# Patient Record
Sex: Female | Born: 1935
Health system: Southern US, Community
[De-identification: ages and names within clinical notes are randomized; demographics above are authoritative.]

## PROBLEM LIST (undated history)

## (undated) DIAGNOSIS — F0280 Dementia in other diseases classified elsewhere without behavioral disturbance: Secondary | ICD-10-CM

## (undated) DIAGNOSIS — I10 Essential (primary) hypertension: Secondary | ICD-10-CM

## (undated) DIAGNOSIS — E7800 Pure hypercholesterolemia, unspecified: Secondary | ICD-10-CM

## (undated) DIAGNOSIS — F410 Panic disorder [episodic paroxysmal anxiety] without agoraphobia: Secondary | ICD-10-CM

## (undated) DIAGNOSIS — I480 Paroxysmal atrial fibrillation: Secondary | ICD-10-CM

## (undated) DIAGNOSIS — H353210 Exudative age-related macular degeneration, right eye, stage unspecified: Secondary | ICD-10-CM

## (undated) DIAGNOSIS — N390 Urinary tract infection, site not specified: Secondary | ICD-10-CM

## (undated) DIAGNOSIS — G3183 Dementia with Lewy bodies: Secondary | ICD-10-CM

## (undated) DIAGNOSIS — F32A Depression, unspecified: Secondary | ICD-10-CM

## (undated) DIAGNOSIS — I459 Conduction disorder, unspecified: Secondary | ICD-10-CM

## (undated) DIAGNOSIS — R55 Syncope and collapse: Secondary | ICD-10-CM

## (undated) DIAGNOSIS — Z952 Presence of prosthetic heart valve: Secondary | ICD-10-CM

## (undated) DIAGNOSIS — R011 Cardiac murmur, unspecified: Secondary | ICD-10-CM

## (undated) DIAGNOSIS — F329 Major depressive disorder, single episode, unspecified: Secondary | ICD-10-CM

## (undated) HISTORY — PX: TONSILLECTOMY: SUR1361

## (undated) HISTORY — DX: Syncope and collapse: R55

## (undated) HISTORY — DX: Paroxysmal atrial fibrillation: I48.0

## (undated) HISTORY — PX: EYE SURGERY: SHX253

## (undated) HISTORY — DX: Urinary tract infection, site not specified: N39.0

---

## 1999-06-30 ENCOUNTER — Emergency Department (HOSPITAL_COMMUNITY): Admission: EM | Admit: 1999-06-30 | Discharge: 1999-06-30 | Payer: Self-pay

## 2004-03-08 ENCOUNTER — Ambulatory Visit: Payer: Self-pay | Admitting: Gastroenterology

## 2004-03-30 ENCOUNTER — Ambulatory Visit: Payer: Self-pay | Admitting: Gastroenterology

## 2006-04-17 ENCOUNTER — Emergency Department (HOSPITAL_COMMUNITY): Admission: EM | Admit: 2006-04-17 | Discharge: 2006-04-17 | Payer: Self-pay | Admitting: Emergency Medicine

## 2007-01-14 ENCOUNTER — Inpatient Hospital Stay (HOSPITAL_COMMUNITY): Admission: EM | Admit: 2007-01-14 | Discharge: 2007-01-15 | Payer: Self-pay | Admitting: Emergency Medicine

## 2007-01-29 ENCOUNTER — Ambulatory Visit: Payer: Self-pay | Admitting: Internal Medicine

## 2007-03-14 ENCOUNTER — Emergency Department (HOSPITAL_COMMUNITY): Admission: EM | Admit: 2007-03-14 | Discharge: 2007-03-14 | Payer: Self-pay | Admitting: Emergency Medicine

## 2007-06-03 ENCOUNTER — Telehealth: Payer: Self-pay | Admitting: Internal Medicine

## 2007-06-09 DIAGNOSIS — I10 Essential (primary) hypertension: Secondary | ICD-10-CM

## 2007-06-09 DIAGNOSIS — M810 Age-related osteoporosis without current pathological fracture: Secondary | ICD-10-CM | POA: Insufficient documentation

## 2007-06-09 DIAGNOSIS — E785 Hyperlipidemia, unspecified: Secondary | ICD-10-CM

## 2007-06-09 DIAGNOSIS — J309 Allergic rhinitis, unspecified: Secondary | ICD-10-CM | POA: Insufficient documentation

## 2007-07-07 ENCOUNTER — Ambulatory Visit: Payer: Self-pay | Admitting: Internal Medicine

## 2007-07-07 DIAGNOSIS — J42 Unspecified chronic bronchitis: Secondary | ICD-10-CM | POA: Insufficient documentation

## 2007-07-07 DIAGNOSIS — J329 Chronic sinusitis, unspecified: Secondary | ICD-10-CM | POA: Insufficient documentation

## 2007-07-09 ENCOUNTER — Ambulatory Visit: Payer: Self-pay | Admitting: Internal Medicine

## 2007-07-11 ENCOUNTER — Ambulatory Visit: Payer: Self-pay | Admitting: Internal Medicine

## 2007-07-16 ENCOUNTER — Telehealth: Payer: Self-pay | Admitting: Internal Medicine

## 2007-07-28 ENCOUNTER — Ambulatory Visit: Payer: Self-pay | Admitting: Internal Medicine

## 2007-08-31 ENCOUNTER — Inpatient Hospital Stay (HOSPITAL_COMMUNITY): Admission: EM | Admit: 2007-08-31 | Discharge: 2007-09-02 | Payer: Self-pay | Admitting: Emergency Medicine

## 2007-09-02 ENCOUNTER — Encounter: Payer: Self-pay | Admitting: Internal Medicine

## 2007-09-05 ENCOUNTER — Ambulatory Visit: Payer: Self-pay | Admitting: Internal Medicine

## 2007-12-09 ENCOUNTER — Ambulatory Visit: Payer: Self-pay | Admitting: Internal Medicine

## 2007-12-09 DIAGNOSIS — J479 Bronchiectasis, uncomplicated: Secondary | ICD-10-CM

## 2007-12-29 ENCOUNTER — Ambulatory Visit: Payer: Self-pay | Admitting: Internal Medicine

## 2007-12-31 ENCOUNTER — Encounter: Payer: Self-pay | Admitting: Internal Medicine

## 2008-07-26 ENCOUNTER — Encounter: Admission: RE | Admit: 2008-07-26 | Discharge: 2008-07-26 | Payer: Self-pay | Admitting: Orthopedic Surgery

## 2008-08-02 ENCOUNTER — Encounter: Admission: RE | Admit: 2008-08-02 | Discharge: 2008-08-02 | Payer: Self-pay | Admitting: Orthopedic Surgery

## 2010-05-21 ENCOUNTER — Encounter: Payer: Self-pay | Admitting: Orthopedic Surgery

## 2010-09-12 NOTE — Discharge Summary (Signed)
NAMECHARON, SMEDBERG NO.:  [id number]   MEDICAL RECORD NO.:  [id number]          PATIENT TYPE:  INP   LOCATION:  1536                         FACILITY:  [facility]   PHYSICIAN:  Ramiro Harvest, MD    DATE OF BIRTH:  [birth year]   DATE OF ADMISSION:  08/31/2007  DATE OF DISCHARGE:  09/02/2007                               DISCHARGE SUMMARY   PRIMARY CARE PHYSICIAN:  C. [name], M.D., [facility].   DISCHARGE DIAGNOSES:  1. Ischemic colitis.  2. Hypokalemia.  3. Hypertension.  4. Hyperlipidemia.  5. Diverticulosis.  6. Sinusitis/rhinitis.  7. Osteoporosis.  8. Hemorrhoids.  9. Status post right cataract surgery Aug 29, 2007.  10.Right adnexal mass compatible with dermoid.   DISCHARGE MEDICATIONS:  1. Lipitor 10 mg p.o. daily.  2. Fosamax 70 mg p.o. weekly.  3. Fluticasone nasal spray in each nostril daily.  4. Cataract eye drops as previously taken.   DISPOSITION AND FOLLOWUP:  The patient will be discharged home with a  low residue diet.  The patient is to schedule a followup appointment  with PCP in the next 1-2 weeks, and on followup, a basic metabolic  profile needs to be checked to follow up on the patient's potassium.  CBC will also need to be checked to follow up on the patient's  hemoglobin.  The patient's blood pressure medications have been  discontinued secondary to ischemic colitis, and the patient's blood  pressure will need to be reassessed per PCP.  Will hold off on blood  pressure medications for the next 4 weeks secondary to ischemic colitis  and will defer to PCP.  The PCP will need to follow up with dermoid  right adnexal mass per transvaginal ultrasound obtained in the hospital.  The patient may need to follow up with her OB/GYN.  Biopsies will need  to be followed up from colonoscopy done on Sep 02, 2007.   CONSULTATIONS:  A GI consult was sought.  The patient was seen in  consultation by [facility].  The patient was  seen by Dr.  Juanda Chance of [facility] on Sep 01, 2007.   PROCEDURES PERFORMED:  1. CT of the abdomen and pelvis was done on Aug 31, 2007 that showed      mucosal edema of the distal transverse colon, splenic flexure of      the colon, descending colon, consistent with colitis.  Multiple      hepatic cysts, small nodular right lung base above the      hemidiaphragm.  Recommend followup CT in 4-6 months to assess      stability.  Colitis involving the descending colon and proximal      sigmoid colon, fatty rounded mass within the right posterior pelvis      with dense calcification peripherally, most  consistent with a      dermoid cyst of 76 x 83 x 81 mm.  2. A transvaginal ultrasound was done on Sep 01, 2007 that showed a      right adnexal mass with sonographic features compatible with  dermoid.  3. A colonoscopy was done on Sep 02, 2007 which showed diverticulosis      and ischemic colitis, acute colitis left and distal transverse      colon consistent with ischemic colitis status post biopsies.   BRIEF ADMISSION HISTORY AND PHYSICAL:  Ms. Rebecca [name] is a 77 year old  white female with history of diverticulosis, internal hemorrhoids,  hypertension, and hyperlipidemia who presented to the ED with a several-  hour history of right lower quadrant intermittent abdominal cramping,  nausea with emesis x1, and diarrhea with bright red blood per rectum x2  episodes.  The patient stated she awoke the night prior to admission  while sleeping with severe cramping, nonradiating in nature, in the  right lower quadrant, intermittent in nature with some loose watery  stools.  The patient stated a few hours later she also developed bright  red blood per rectum and went to the ED.  The patient denied any fevers,  no chills, no hematemesis, no melena, no chest pain, no shortness of  breath, no cough, no headache, no visual changes, no recent trauma, no  recent use of antibiotics, no exotic foods, no  dizziness, no syncope, no  other associated symptoms.  In the ED, the patient had another episode  of bright red blood per rectum.  The patient was found to be hypokalemic  and to have a slightly elevated white count.  The patient was given a  500 mL bolus of normal saline in the ED and also given a dose of Cipro  and Flagyl.  CT of the abdomen and pelvis were ordered and were pending  at time of admission.   ADMISSION PHYSICAL EXAMINATION:  VITAL SIGNS:  Temperature 97.2, blood  pressure 132/64, pulse of 104 down to 85, respiratory rate 18, sating  98% on room air.  GENERAL:  The patient is a pleasant elderly female in no apparent  distress.  HEENT:  Normocephalic, atraumatic.  Pupils equal, round and reactive to  light.  Extraocular movements intact.  Oropharynx is clear, moist, no  lesions, no exudates.  NECK:  Supple.  No lymphadenopathy.  RESPIRATORY:  Lungs clear to auscultation bilaterally.  No wheezes, no  rhonchi, no crackles.  CARDIOVASCULAR:  Regular rate and rhythm.  No murmurs, rubs or gallops.  ABDOMEN:  Soft, nontender, nondistended, positive bowel sounds.  No  rebound, no guarding.  EXTREMITIES:  No clubbing, cyanosis or edema.  NEUROLOGIC:  The patient alert and oriented x3.  Cranial nerves II-XII  grossly intact.  No focal deficits.   LABORATORY DATA:  On admission, CBC with white count of 11.1, hemoglobin  14.1, platelets 229, hematocrit 42.1, ANC of 7.7.  CMET:  Sodium 138,  potassium 2.9, chloride 97, bicarb 30, BUN 16, creatinine 1.12, glucose  126.  Calcium 10, bilirubin 0.9, albumin 4.0, alkaline phosphatase 103,  AST 23, ALT 17, protein 7.3.  Lipase 20.  UA:  Yellow, clear, specific  gravity 1.0021, pH of 6, glucose negative, bilirubin negative, ketones  negative, blood moderate, protein negative, urobilinogen 0.2, nitrite  negative, leukocytes moderate, micro WBC 3-6, RBCs 0-2.   HOSPITAL COURSE:  Problem 1.  Ischemic of colitis.  The patient was   admitted with nausea, vomiting, abdominal pain, and bright red blood per  rectum.  The patient was initially sent to the step-down unit.  CBC was  obtained q.8h.  The patient was hydrated with IV fluids, given a liter  bolus of normal saline, and placed on 100  mL per hour.  The patient  remained stable.  The patient had one more episode of bright red blood  per rectum.  The patient was also initially placed on Cipro and Flagyl  for empiric coverage for possible infectious colitis.  Stool cultures  were obtained, and at the time of discharge, ova and parasites were  negative and stool cultures have been negative.  The patient was typed  and crossed for 2 units of packed red blood cells and placed on a  Protonix as well.  [facility] was consulted.  The patient  was seen in consultation by Dr. Juanda Chance of [facility] on Sep 01, 2007.  The patient's Protonix was discontinued.  The patient was prepped for  colonoscopy which was done on Sep 02, 2007, with colonoscopy results as  stated above.  Colonoscopy results were consistent with ischemic  colitis.  The patient's Cipro and Flagyl were discontinued.  The  patient's diet was advanced to a low residue diet.  The patient  tolerated diet well.  The patient was painfree by day of discharge.  The  patient remained in stable and improved condition.  By day of discharge,  the patient was in improved condition.  The patient's blood pressure  medications have been discontinued secondary to ischemic colitis and  will remain off her blood pressure medicines.  Will defer to primary  care physician for restart of those, but will probably not restart the  patient's blood pressure medicines for the next month or so.  Colonoscopy biopsies were obtained, and these will need to be followed  up per PCP.   Problem 2.  Hypokalemia.  The patient was found to be hypokalemic and it  was felt secondary to loose stools/diarrhea.  Magnesium level was   checked.  The patient's magnesium was 1.5.  The patient's magnesium was  repleted, and the patient's potassium was repleted.  On the morning of  Sep 02, 2007, the patient had a potassium level of 2.9.  The patient was  given 3 doses of K-Dur 40 mEq prior to discharge, and the patient's BMET  will need to be checked to follow up on the patient's potassium and  follow up with PCP.   Problem 3.  Hypertension.  The patient's blood pressure medications were  held secondary to her ischemic colitis.  The patient will not be  restarted on her blood pressure medications on discharge.  This will be  deferred to her PCP.   Problem 4.  Hyperlipidemia, stable.  Will continue the patient on her  home dose of Lipitor.   The rest of the patient's chronic medical issues were stable throughout  the hospitalization, and the patient will be discharged in stable and  improved condition.   DISCHARGE VITAL SIGNS:  VITAL SIGNS:  On day of discharge, temperature  97.1, pulse of 77, blood pressure 117/62, respiratory rate 18, sating  94% on room air.   DISCHARGE LAB DATA:  BMET on discharge:  Sodium 138, potassium 2.9,  chloride 107, bicarb 28, BUN 7, creatinine 0.75, glucose of 93, calcium  of 8.1, magnesium of 1.9.  CBC:  White count 9.0, hemoglobin 11.3,  hematocrit 32.8, and platelets was 175.  CA-125 was 11.9.  Stool was  negative for ova and parasites.   The patient will be discharged in stable and improved condition to  follow up with PCP, as stated above.   It was a pleasure taking care of Ms. Rebecca [name].  Ramiro Harvest, MD  Electronically Signed     DT/MEDQ  D:  09/02/2007  T:  09/02/2007  Job:  [id number]   cc:   C. [name], M.D.  Fax: [phone]   Hedwig Morton. Juanda Chance, MD  [address]  [place]  [place] [address]

## 2010-09-12 NOTE — Discharge Summary (Signed)
Rebecca [name], Rebecca [name]                ACCOUNT NO.:  [id number]   MEDICAL RECORD NO.:  [id number]          PATIENT TYPE:  INP   LOCATION:  1309                         FACILITY:  [facility]   PHYSICIAN:  Michelene Gardener, MD    DATE OF BIRTH:  1925-09-10   DATE OF ADMISSION:  01/14/2007  DATE OF DISCHARGE:                               DISCHARGE SUMMARY   PRIMARY CARE PHYSICIAN:  Erin E Black, DO   DISCHARGE DIAGNOSES:  1. Acute pneumonia.  2. Postnasal drip.  3. Anorexia.  4. Weight loss.  5. Hypokalemia.  6. Hypertension.  7. Osteoporosis.  8. Hyperlipidemia.  9. Gastroesophageal reflux disease.   DISCHARGE MEDICATIONS:  1. Adalat 90 mg p.o. daily.  2. Benicar 40/25 mg p.o. daily.  3. Fosamax 70 mg once weekly.  4. Lipitor 10 mg p.o. daily.   NEW MEDICATIONS:  1. Avelox 400 mg p.o. daily x7 days.  2. Tussionex 5 ml p.o. every 12 hrs as needed.  3. Albuterol 2 puffs every 4 hrs p.r.n.   CONSULTATIONS:  None.   PROCEDURE:  None.   FOLLOWUP:  Primary physician in one to two weeks.   RADIOLOGY STUDIES:  1. Chest x-ray on September 16th showed possible pneumonia involving      the lingula on the right upper lobe.  2. Repeat chest x-ray showed questionable pneumonia with small      bilateral effusions.   HOSPITAL COURSE:  This is a 77 year old Caucasian female who presented  to the hospital with chief complaint of cough.  Her x-rays showed  possible pneumonia in the lingula.  The patient had repeat x-ray done on  September 17th; it showed the same findings.  The patient remained  afebrile.  White count remained normal.  At this point the patient's  oxygen is to be taken out before discharge.   DISCHARGE MEDICATIONS:  She will be prescribed 400 mg of Avelox for 7  days.  I will also give her Albuterol to be taken as needed.  She will  take Tussionex 5 mg as needed.   DISCHARGE INSTRUCTIONS:  The patient was advised to follow with her  primary care physician in about one  week.      Michelene Gardener, MD  Electronically Signed     NAE/MEDQ  D:  01/15/2007  T:  01/16/2007  Job:  [id number]   cc:   Carma Leaven, DO  Fax: [phone]

## 2010-09-12 NOTE — Assessment & Plan Note (Signed)
HEALTHCARE                             PULMONARY OFFICE NOTE   NAME:Rebecca [name]                         MRN:          [id number]  DATE:01/29/2007                            DOB:          [birth year]    CHIEF COMPLAINT:  Cough.   HISTORY:  A delightful, 77 year old, white female who only smoked until  the age of 64 and had no significant chronic respiratory complaints  until about 10 years ago when she developed a sensation of year round  sinus congestion, worse in the spring and the fall sometimes associated  with a sensation of drainage. However over the last year, her  drainage has been a low worse and is associated with a hacking cough  that wakes her from her sleep early in the mornings and then exacerbates  first thing in the morning but does not actually produce excess mucous.  She was admitted to the hospital with this complaint on September 16  with a clinical diagnosis of acute bronchitis post nasal drip syndrome  and GERD. I asked her of all the things that she has taken what has  helped her the most. Initially she said nothing. Her husband disputes  this and thinks prednisone has made the most difference and is presently  tapering off a short course of therapy. However, she has failed to date  treatment directed at rhinitis and sinusitis by Dr. Lazarus [name], Spiriva and  albuterol. She denies any orthopnea or PND, she does have a sensation of  sinus pain and has referred herself to Dr. Ashley [name] for evaluation.   PAST MEDICAL HISTORY:  Significant for hypertension, sinus problems as  noted above. Hypertension, hyperlipidemia, osteopenia/osteoporosis.   ALLERGIES:  None known.   MEDICATIONS:  Adalat, Benicar, Lipitor, Fosamax, Flonase, Spiriva,  prednisone, calcium with D and multivitamins. For a full inventory with  dosing, see face sheet column dated January 29, 2007, reviewed with the  patient in detail and correct as listed.   SOCIAL  HISTORY:  She quit smoking 50 years ago. She is retired with no  unusual travel, pet or hobby exposure.   FAMILY HISTORY:  Significant for absence of respiratory diseases or  atopy and was reviewed in detail on the worksheet.   REVIEW OF SYSTEMS:  Reviewed in detail on the worksheet and negative  except as outlined as above.   PHYSICAL EXAMINATION:  GENERAL:  This is a pleasant, animated, white  female in no acute distress.  VITAL SIGNS:  She had stable vital signs.  HEENT:  Reveals mild turbinate edema. Oropharynx is clear however with  no evidence of excessive post nasal drainage or cobblestoning yet she  cleared her throat frequently during the interview and exam. The ear  canals are clear bilaterally.  NECK:  Supple without cervical adenopathy or tenderness. The trachea is  midline, no thyromegaly.  LUNGS:  Lung fields reveal classic and slight prominent pseudowheeze.  There is a regular rate and rhythm without murmur, gallop or rub.  ABDOMEN:  Soft and benign.  EXTREMITIES:  Warm without calf tenderness, cyanosis, clubbing or edema.  CT scan of the chest was reviewed from [facility] dated January 22, 2007 and does demonstrate mild bronchiectatic changes in the right  middle lobe and lingula.   IMPRESSION:  1. Bronchiectasis involving the middle lobe and lingula probably      represents acquired mucociliary dysfunction and could I suppose put      her at risk of opportunistic infection and/or MAI.  2. However, the cough and the wheezing she is doing is all upper      airway in nature with a differential diagnosis of post nasal drip      syndrome versus reflux. Since she is already being treated for      sinusitis aggressively by [facility] with no benefit and also received      treatment directed at asthma and COPD with albuterol and Spiriva      with no benefit, I have recommended shifting gears and treating her      aggressively for reflux for the next 2 weeks as  follows.  3. I reviewed her diet with her emphasizing the avoidance of mint and      menthol lozenges.  4. I added Zegerid 40 mg at bedtime just for the next 2 weeks.  5. To control cyclical coughing, I recommending added tramadol 50 mg 1      every 4 h and because reflux may be playing a major role here would      consider stopping the Fosamax and for that matter the calcium      channel blocker (which directly inhibits gastroesophageal valve      function since it is a potent calcium channel blocker of the GI      tract).   I have asked her to return in 2 weeks to see if she benefits from this  and asked her to taper off prednisone, stop Spiriva at this point and  keep the appointment with Dr. Ashley [name] to address the issue of whether  she has a blocked sinus or not.     Rebecca [name]. Rebecca Sires, MD, [facility]  Electronically Signed    MBW/MedQ  DD: 01/29/2007  DT: 01/30/2007  Job #: [id number]   cc:   Rebecca Leaven, DO

## 2010-09-12 NOTE — H&P (Signed)
Rebecca [name], Rebecca [name]                ACCOUNT NO.:  [id number]   MEDICAL RECORD NO.:  [id number]          PATIENT TYPE:  INP   LOCATION:  0105                         [facility]   PHYSICIAN:  Barnetta Chapel, MDDATE OF BIRTH:  January 15, 1926   DATE OF ADMISSION:  01/14/2007  DATE OF DISCHARGE:                              HISTORY & PHYSICAL   PRIMARY CARE PHYSICIAN:  Erin E Black, DO   CHIEF COMPLAINT:  Cough.   HISTORY OF PRESENTING COMPLAINT:  The patient is a 77 year old female  with past medical history significant for hypertension which is well  controlled, hypercholesterolemia, and acid reflux.  The patient has had  problems with her sinuses for awhile and is known to have postnasal  drip. The patient presented with a chronic cough which has gotten worse  over the last 1-2 days.  She also reports postnasal drip symptoms.  The  patient's cough is worse at nighttime.  The cough is intermittently  productive of yellowish sputum.  No associated fever or chills.  No  associated shortness of breath.  No headache, no neck pain, no chest  pain, no GI symptoms or urinary symptoms.  The patient has poor appetite  and, therefore, has lost about 10 pounds in weight.   PAST MEDICAL HISTORY:  Essentially as above.   MEDICATIONS PRIOR TO ADMISSION:  1. Adalat.  2. Benicar.  3. Fosamax.  4. Lipitor.  5. Protonix.   ALLERGIES:  None.   SOCIAL HISTORY:  The patient is married.  She used to be a heavy smoker  but quit about 50 years ago.  No history of alcohol use or illicit drug  use.   FAMILY HISTORY:  Noncontributory.   REVIEW OF SYSTEMS:  Essentially as above.   PHYSICAL EXAMINATION:  VITAL SIGNS:  On admission, temperature 97.3,  blood pressure 109/59 mmHg.  Heart rate is 112 per minute.  The  respiratory rate is 20 per minute.  O2 saturation 97%.  GENERAL:  The patient is not in any acute distress.  HEENT:  No pallor, no jaundice. Extraocular movements are intact.  NECK:   Supple.  There is no JVD or lymphadenopathy.  LUNGS:  Reveal very mild expiratory wheeze.  No other added sounds.  CARDIOVASCULAR:  S1, S2, tachycardic.  ABDOMEN:  Soft and benign.  There is no organomegaly.  Bowel sounds are  present.  NEUROLOGIC:  Exam is nonfocal.  EXTREMITIES:  No edema.   LABORATORY DATA:  White count 9.5, hemoglobin 13.6, hematocrit 39.1, MCV  85.8, platelet count 237.  Sodium 132, potassium 3.4, chloride 91, CO2  28, BUN 18, creatinine 0.93, blood sugar 418.  UA reveals small  leukocyte esterase.  BNP 36.8.   Chest x-ray is officially interpreted as findings compatible with  pneumonia involving the lingula and also right upper lobe.   IMPRESSION:  1. Suspect pneumonia.  2. Postnasal drip.  3. Anorexia.  4. Weight loss.  5. Hypokalemia.   PLAN:  1. Will admit the patient to a regular medical floor.  2. Will start the patient on IV antibiotics.  3. Will  also treat the patient's postnasal drip.  4. Will repeat the chest x-ray in the morning.  5. Will culture the patient's sputum.  6. Will get LFTs and TSH checked.  7. Will have a low threshold to order a CT scan of the chest      considering the chronicity of the cough and the history of heavy      cigarette use in the past.  8. We will also culture the patient's urine and check prealbumin.  9. Will replete the patient's potassium.  10.Will also start the patient on nebulizer treatments.  11.Will continue the patient's home medications.      Barnetta Chapel, MD  Electronically Signed     SIO/MEDQ  D:  01/14/2007  T:  01/14/2007  Job:  [id number]   cc:   Carma Leaven, DO  Fax: [phone]

## 2010-09-12 NOTE — H&P (Signed)
Rebecca [name] NO.:  [id number]   MEDICAL RECORD NO.:  [id number]          PATIENT TYPE:  EMS   LOCATION:  ED                           FACILITY:  [facility]   PHYSICIAN:  Ramiro Harvest, MD    DATE OF BIRTH:  26-Jun-1925   DATE OF ADMISSION:  08/31/2007  DATE OF DISCHARGE:                              HISTORY & PHYSICAL   PRIMARY CARE PHYSICIAN:  C. [name], M.D.   GASTROENTEROLOGIST:  [facility] gastroenterology, Venita Lick. Russella Dar, M.D.,  [facility]   HISTORY OF PRESENT ILLNESS:  Rebecca [name] is a 77 year old white female  with history of diverticulosis, internal hemorrhoids, hypertension,  hyperlipidemia who presents to the ED with a several-hour history of  right lower quadrant intermittent abdominal cramping, nausea, emesis x1,  diarrhea and bright red blood per rectum x2 episodes.  The patient  states she awoke last night while sleeping with a severe cramping  nonradiating right lower quadrant abdominal pain, intermittent in nature  with some loose watery stools.  The patient stated a few hours later she  also developed bright red blood per rectum and came to the ED.  The  patient denied any fevers, no chills, no hematemesis, no melena, no  chest pain, no shortness of breath, no cough, no headache, no visual  changes, no recent travel, no recent use of antibiotics, no exotic  foods, no dizziness, no syncope, no other associated symptoms.  In the  ED, the patient had another episode of bright red blood per rectum.  The  patient was found to be hypokalemic and to have a slightly elevated  white blood cell count.  The patient was given a 500 mL bolus of normal  saline in the ED and also given a dose of Cipro and Flagyl in the  emergency room.  CT of the abdomen and pelvis were ordered, and we were  called to admit the patient for further evaluation and management.   ALLERGIES:  NO KNOWN DRUG ALLERGIES.   PAST MEDICAL HISTORY:  1. Hypertension.  2.  Hyperlipidemia.  3. Chronic cough.  4. Chronic postnasal drip syndrome.  5. Osteoporosis.  6. Rhinitis/sinusitis.  7. Diverticulosis per colonoscopy of December of 2005.  8. Internal hemorrhoids per colonoscopy of December of 2005.  9. Bronchiectasis.  10.Status post right eye cataract surgery on Aug 29, 2007.   HOME MEDICATIONS:  1. Adalat 90 mg daily.  2. Benicar 40/25 mg daily.  3. Fosamax 70 mg weekly.  4. Lipitor 10 mg daily.  5. Fluticasone spray once spray in each nostril daily.   SOCIAL HISTORY:  The patient is retired, married, lives in [place]  with her husband.  Prior tobacco history, quit approximately 51 years  ago.  No alcohol use.  No IV drug use.  The patient has three children,  two sons, one with hypertension and has one daughter who was a history  of colitis.   FAMILY HISTORY:  Noncontributory.   REVIEW OF SYSTEMS:  As per HPI.   PHYSICAL EXAMINATION:  VITAL SIGNS:  Temperature 97.2, blood pressure  132/64, pulse of 104  going down to 85, respiratory rate 18, sating 98%  on room air.  GENERAL:  The patient is a pleasant, elderly female in no apparent  distress.  HEENT:  Normocephalic, atraumatic.  Pupils equal, round and reactive to  light.  Extraocular movements intact.  Oropharynx is clear, moist, no  lesions, no exudates.  NECK:  Supple.  No lymphadenopathy.  RESPIRATORY:  Lungs are clear to auscultation bilaterally.  No wheezes,  no rhonchi, no crackles.  CARDIOVASCULAR:  Regular rate and rhythm.  No murmurs, rubs or gallops.  ABDOMEN:  Soft, nontender, nondistended.  Positive bowel sounds.  No  rebound or guarding.  EXTREMITIES:  No clubbing, cyanosis or edema.  NEUROLOGICAL:  The patient is alert and oriented x3.  Cranial nerves II-  XII are grossly intact.  No focal deficits.   LABORATORY DATA:  CBC:  White count 11.1, hemoglobin 14.1, platelets  229, hematocrit 42.1, ANC of 7.7.  CMET:  Sodium 138, potassium 2.9,  chloride 97, bicarb 30, BUN  16, creatinine 1.12, glucose of 126.  Calcium of 10.0, albumin 4.0, bilirubin 0.9, alkaline phosphatase 103,  AST 23, ALT 17, protein 7.3.  Lipase 20.  UA:  Yellow, clear, specific  gravity 1.0021, pH of 6, glucose negative, bilirubin negative, ketones  negative, blood moderate, protein negative, urobilinogen 0.2, nitrite  negative, leukocytes moderate, micro WBC 3-6, RBCs 0-2.   ASSESSMENT AND PLAN:  Ms. Rebecca [name] is a 77 year old female with  history of diverticulosis, internal hemorrhoids, hyperlipidemia,  hypertension who presents to the emergency department with nausea,  vomiting, abdominal pain, and bright red blood per rectum.  1. Nausea, vomiting, abdominal pain, bright red blood per rectum      likely secondary to ischemic colitis versus an infectious colitis      versus a lower gastrointestinal bleed.  Will admit the patient to      the step-down unit.  We will give the patient 1 liter of normal      saline intravenous fluid bolus and then 125 mL an hour.  Will check      a stat complete blood count now and then every 8 hours.  Will check      a magnesium level.  Check coags.  Will check stool studies with      Clostridium difficile, ova and parasite, and pathogens.  We will      type and cross 2 units packed red blood cells.  Protonix 40 mg      intravenously every 12 hours.  Place the patient on clear liquids.      We will cover empirically with ciprofloxacin and Flagyl.  CT of the      abdomen and pelvis is pending.  Will consult gastroenterology for      further evaluation and management.  2. Hypokalemia, likely secondary to diarrhea.  Will check a magnesium      level and we will replete.  3. Probable urinary tract infection.  Will check a urine culture, and      the patient will be on ciprofloxacin which will cover for urinary      tract infection empirically.  Will await urine cultures.  4. Leukocytosis, no left shift, likely secondary to stress.  Urine      with  probable urinary tract infection.  The patient is, however, on      antibiotics.  For now, will monitor and follow.  5. Hypertension.  Will hold blood pressure medications secondary to  problem #1.  6. Hyperlipidemia.  Lipitor.  7. Sinusitis/rhinitis.  8. Chronic postnasal drip syndrome.  9. Osteoporosis.  10.Diverticulosis.  11.Hemorrhoids.  12.Status post right cataract surgery on Aug 29, 2007.  13.Prophylaxis.  Protonix for gastrointestinal prophylaxis, sequential      compression devices for deep venous thrombosis prophylaxis.   It has been a pleasure taking care of Ms. Rebecca [name].      Ramiro Harvest, MD  Electronically Signed     DT/MEDQ  D:  08/31/2007  T:  08/31/2007  Job:  [id number]   cc:   Venita Lick. Russella Dar, MD, FACG  [address]  [place]  [place] [address]   C. [name], M.D.  Fax: [phone]

## 2010-11-22 ENCOUNTER — Encounter (INDEPENDENT_AMBULATORY_CARE_PROVIDER_SITE_OTHER): Payer: Medicare Other | Admitting: Ophthalmology

## 2010-11-22 DIAGNOSIS — H3530 Unspecified macular degeneration: Secondary | ICD-10-CM

## 2010-11-22 DIAGNOSIS — H43819 Vitreous degeneration, unspecified eye: Secondary | ICD-10-CM

## 2010-11-22 DIAGNOSIS — H353290 Exudative age-related macular degeneration, unspecified eye, stage unspecified: Secondary | ICD-10-CM

## 2010-12-20 ENCOUNTER — Encounter (INDEPENDENT_AMBULATORY_CARE_PROVIDER_SITE_OTHER): Payer: Medicare Other | Admitting: Ophthalmology

## 2010-12-20 DIAGNOSIS — H3530 Unspecified macular degeneration: Secondary | ICD-10-CM

## 2010-12-20 DIAGNOSIS — H353290 Exudative age-related macular degeneration, unspecified eye, stage unspecified: Secondary | ICD-10-CM

## 2010-12-20 DIAGNOSIS — H43819 Vitreous degeneration, unspecified eye: Secondary | ICD-10-CM

## 2010-12-29 ENCOUNTER — Encounter (INDEPENDENT_AMBULATORY_CARE_PROVIDER_SITE_OTHER): Payer: Medicare Other | Admitting: Ophthalmology

## 2010-12-29 DIAGNOSIS — H35059 Retinal neovascularization, unspecified, unspecified eye: Secondary | ICD-10-CM

## 2011-01-10 ENCOUNTER — Encounter (INDEPENDENT_AMBULATORY_CARE_PROVIDER_SITE_OTHER): Payer: Medicare Other | Admitting: Ophthalmology

## 2011-01-10 DIAGNOSIS — H43819 Vitreous degeneration, unspecified eye: Secondary | ICD-10-CM

## 2011-01-10 DIAGNOSIS — H353290 Exudative age-related macular degeneration, unspecified eye, stage unspecified: Secondary | ICD-10-CM

## 2011-01-10 DIAGNOSIS — H3530 Unspecified macular degeneration: Secondary | ICD-10-CM

## 2011-02-06 LAB — DIFFERENTIAL
Basophils Absolute: 0
Basophils Relative: 0
Eosinophils Relative: 1
Lymphocytes Relative: 23
Monocytes Absolute: 0.5
Monocytes Relative: 7

## 2011-02-06 LAB — POCT CARDIAC MARKERS: Troponin i, poc: <0.05

## 2011-02-06 LAB — BASIC METABOLIC PANEL
CO2: 30
Glucose, Bld: 138 — ABNORMAL HIGH
Potassium: 3.6
Sodium: 135

## 2011-02-06 LAB — CBC
HCT: 34.9 — ABNORMAL LOW
Hemoglobin: 12.1
MCHC: 34.6
RBC: 4.03
RDW: 15.6 — ABNORMAL HIGH

## 2011-02-07 ENCOUNTER — Encounter (INDEPENDENT_AMBULATORY_CARE_PROVIDER_SITE_OTHER): Payer: Medicare Other | Admitting: Ophthalmology

## 2011-02-07 DIAGNOSIS — H43819 Vitreous degeneration, unspecified eye: Secondary | ICD-10-CM

## 2011-02-07 DIAGNOSIS — H3530 Unspecified macular degeneration: Secondary | ICD-10-CM

## 2011-02-07 DIAGNOSIS — H353290 Exudative age-related macular degeneration, unspecified eye, stage unspecified: Secondary | ICD-10-CM

## 2011-02-08 LAB — BLOOD GAS, ARTERIAL
Acid-Base Excess: 5.1 — ABNORMAL HIGH
Bicarbonate: 29 — ABNORMAL HIGH
Drawn by: 145321
O2 Content: 2
O2 Saturation: 95.9
Patient temperature: 98.6
TCO2: 26
pCO2 arterial: 41.3
pH, Arterial: 7.46 — ABNORMAL HIGH
pO2, Arterial: 81.8

## 2011-02-08 LAB — BASIC METABOLIC PANEL
BUN: 4 — ABNORMAL LOW
BUN: 8
CO2: 28
CO2: 29
Calcium: 8.8
Calcium: 9.6
Chloride: 95 — ABNORMAL LOW
Creatinine, Ser: 0.74
Creatinine, Ser: 0.93
GFR calc Af Amer: >60
GFR calc non Af Amer: 58 — ABNORMAL LOW
GFR calc non Af Amer: >60
Glucose, Bld: 118 — ABNORMAL HIGH
Glucose, Bld: 118 — ABNORMAL HIGH
Potassium: 3.2 — ABNORMAL LOW
Sodium: 132 — ABNORMAL LOW
Sodium: 134 — ABNORMAL LOW

## 2011-02-08 LAB — URINE CULTURE: Colony Count: 45000

## 2011-02-08 LAB — HEPATIC FUNCTION PANEL
ALT: 13
AST: 17
Albumin: 3.2 — ABNORMAL LOW
Alkaline Phosphatase: 62
Bilirubin, Direct: 0.1
Indirect Bilirubin: 0.6
Total Bilirubin: 0.7
Total Protein: 6.7

## 2011-02-08 LAB — CULTURE, BLOOD (ROUTINE X 2): Culture: NO GROWTH

## 2011-02-08 LAB — CBC
Hemoglobin: 13.6
MCHC: 34.7
Platelets: 337
RDW: 14.9 — ABNORMAL HIGH

## 2011-02-08 LAB — URINALYSIS, ROUTINE W REFLEX MICROSCOPIC
Bilirubin Urine: NEGATIVE
Nitrite: NEGATIVE
Specific Gravity, Urine: 1.018
Urobilinogen, UA: 0.2
pH: 5.5

## 2011-02-08 LAB — CULTURE, RESPIRATORY

## 2011-02-08 LAB — PREALBUMIN: Prealbumin: 21.3

## 2011-02-08 LAB — EXPECTORATED SPUTUM ASSESSMENT W GRAM STAIN, RFLX TO RESP C

## 2011-02-08 LAB — DIFFERENTIAL
Basophils Absolute: 0
Basophils Relative: 1
Lymphocytes Relative: 15
Monocytes Absolute: 0.8 — ABNORMAL HIGH
Neutro Abs: 6.5
Neutrophils Relative %: 68

## 2011-02-08 LAB — B-NATRIURETIC PEPTIDE (CONVERTED LAB): Pro B Natriuretic peptide (BNP): 36.8

## 2011-02-08 LAB — URINE MICROSCOPIC-ADD ON

## 2011-02-08 LAB — CULTURE, RESPIRATORY W GRAM STAIN

## 2011-02-08 LAB — TSH: TSH: 1.511

## 2011-02-08 LAB — EXPECTORATED SPUTUM ASSESSMENT W REFEX TO RESP CULTURE

## 2011-02-08 LAB — MAGNESIUM: Magnesium: 2

## 2011-02-08 LAB — PHOSPHORUS: Phosphorus: 3.5

## 2011-03-07 ENCOUNTER — Encounter (INDEPENDENT_AMBULATORY_CARE_PROVIDER_SITE_OTHER): Payer: Medicare Other | Admitting: Ophthalmology

## 2011-03-07 DIAGNOSIS — H353290 Exudative age-related macular degeneration, unspecified eye, stage unspecified: Secondary | ICD-10-CM

## 2011-03-07 DIAGNOSIS — H43819 Vitreous degeneration, unspecified eye: Secondary | ICD-10-CM

## 2011-03-07 DIAGNOSIS — H3530 Unspecified macular degeneration: Secondary | ICD-10-CM

## 2011-04-04 ENCOUNTER — Encounter (INDEPENDENT_AMBULATORY_CARE_PROVIDER_SITE_OTHER): Payer: Medicare Other | Admitting: Ophthalmology

## 2011-04-04 DIAGNOSIS — H2510 Age-related nuclear cataract, unspecified eye: Secondary | ICD-10-CM

## 2011-04-04 DIAGNOSIS — H353290 Exudative age-related macular degeneration, unspecified eye, stage unspecified: Secondary | ICD-10-CM

## 2011-04-04 DIAGNOSIS — H43819 Vitreous degeneration, unspecified eye: Secondary | ICD-10-CM

## 2011-04-04 DIAGNOSIS — H3530 Unspecified macular degeneration: Secondary | ICD-10-CM

## 2011-05-02 ENCOUNTER — Encounter (INDEPENDENT_AMBULATORY_CARE_PROVIDER_SITE_OTHER): Payer: Medicare Other | Admitting: Ophthalmology

## 2011-05-02 DIAGNOSIS — H43819 Vitreous degeneration, unspecified eye: Secondary | ICD-10-CM | POA: Diagnosis not present

## 2011-05-02 DIAGNOSIS — H3530 Unspecified macular degeneration: Secondary | ICD-10-CM | POA: Diagnosis not present

## 2011-05-02 DIAGNOSIS — H353290 Exudative age-related macular degeneration, unspecified eye, stage unspecified: Secondary | ICD-10-CM

## 2011-05-23 DIAGNOSIS — R05 Cough: Secondary | ICD-10-CM | POA: Diagnosis not present

## 2011-05-23 DIAGNOSIS — R059 Cough, unspecified: Secondary | ICD-10-CM | POA: Diagnosis not present

## 2011-05-29 DIAGNOSIS — J3489 Other specified disorders of nose and nasal sinuses: Secondary | ICD-10-CM | POA: Diagnosis not present

## 2011-05-31 ENCOUNTER — Encounter (INDEPENDENT_AMBULATORY_CARE_PROVIDER_SITE_OTHER): Payer: Medicare Other | Admitting: Ophthalmology

## 2011-06-04 ENCOUNTER — Encounter (INDEPENDENT_AMBULATORY_CARE_PROVIDER_SITE_OTHER): Payer: Medicare Other | Admitting: Ophthalmology

## 2011-06-13 ENCOUNTER — Encounter (INDEPENDENT_AMBULATORY_CARE_PROVIDER_SITE_OTHER): Payer: Medicare Other | Admitting: Ophthalmology

## 2011-06-13 DIAGNOSIS — H43819 Vitreous degeneration, unspecified eye: Secondary | ICD-10-CM

## 2011-06-13 DIAGNOSIS — H353290 Exudative age-related macular degeneration, unspecified eye, stage unspecified: Secondary | ICD-10-CM

## 2011-06-13 DIAGNOSIS — H3530 Unspecified macular degeneration: Secondary | ICD-10-CM | POA: Diagnosis not present

## 2011-07-16 ENCOUNTER — Encounter (INDEPENDENT_AMBULATORY_CARE_PROVIDER_SITE_OTHER): Payer: Medicare Other | Admitting: Ophthalmology

## 2011-07-16 DIAGNOSIS — H43819 Vitreous degeneration, unspecified eye: Secondary | ICD-10-CM | POA: Diagnosis not present

## 2011-07-16 DIAGNOSIS — H353290 Exudative age-related macular degeneration, unspecified eye, stage unspecified: Secondary | ICD-10-CM

## 2011-07-16 DIAGNOSIS — H3530 Unspecified macular degeneration: Secondary | ICD-10-CM

## 2011-07-20 DIAGNOSIS — K5289 Other specified noninfective gastroenteritis and colitis: Secondary | ICD-10-CM | POA: Diagnosis not present

## 2011-08-01 DIAGNOSIS — J329 Chronic sinusitis, unspecified: Secondary | ICD-10-CM | POA: Diagnosis not present

## 2011-08-13 ENCOUNTER — Encounter (INDEPENDENT_AMBULATORY_CARE_PROVIDER_SITE_OTHER): Payer: Medicare Other | Admitting: Ophthalmology

## 2011-08-13 DIAGNOSIS — H43819 Vitreous degeneration, unspecified eye: Secondary | ICD-10-CM

## 2011-08-13 DIAGNOSIS — H2510 Age-related nuclear cataract, unspecified eye: Secondary | ICD-10-CM | POA: Diagnosis not present

## 2011-08-13 DIAGNOSIS — H353290 Exudative age-related macular degeneration, unspecified eye, stage unspecified: Secondary | ICD-10-CM

## 2011-08-13 DIAGNOSIS — H3530 Unspecified macular degeneration: Secondary | ICD-10-CM | POA: Diagnosis not present

## 2011-09-10 ENCOUNTER — Encounter (INDEPENDENT_AMBULATORY_CARE_PROVIDER_SITE_OTHER): Payer: Medicare Other | Admitting: Ophthalmology

## 2011-09-10 DIAGNOSIS — H3530 Unspecified macular degeneration: Secondary | ICD-10-CM | POA: Diagnosis not present

## 2011-09-10 DIAGNOSIS — H353290 Exudative age-related macular degeneration, unspecified eye, stage unspecified: Secondary | ICD-10-CM | POA: Diagnosis not present

## 2011-09-18 DIAGNOSIS — J329 Chronic sinusitis, unspecified: Secondary | ICD-10-CM | POA: Diagnosis not present

## 2011-10-08 ENCOUNTER — Encounter (INDEPENDENT_AMBULATORY_CARE_PROVIDER_SITE_OTHER): Payer: Medicare Other | Admitting: Ophthalmology

## 2011-10-08 DIAGNOSIS — H3530 Unspecified macular degeneration: Secondary | ICD-10-CM | POA: Diagnosis not present

## 2011-10-08 DIAGNOSIS — H353290 Exudative age-related macular degeneration, unspecified eye, stage unspecified: Secondary | ICD-10-CM | POA: Diagnosis not present

## 2011-10-08 DIAGNOSIS — H6120 Impacted cerumen, unspecified ear: Secondary | ICD-10-CM | POA: Diagnosis not present

## 2011-11-05 ENCOUNTER — Encounter (INDEPENDENT_AMBULATORY_CARE_PROVIDER_SITE_OTHER): Payer: Medicare Other | Admitting: Ophthalmology

## 2011-11-05 DIAGNOSIS — H353290 Exudative age-related macular degeneration, unspecified eye, stage unspecified: Secondary | ICD-10-CM

## 2011-11-05 DIAGNOSIS — H2510 Age-related nuclear cataract, unspecified eye: Secondary | ICD-10-CM

## 2011-11-05 DIAGNOSIS — H43819 Vitreous degeneration, unspecified eye: Secondary | ICD-10-CM

## 2011-11-05 DIAGNOSIS — H3530 Unspecified macular degeneration: Secondary | ICD-10-CM

## 2011-12-03 ENCOUNTER — Encounter (INDEPENDENT_AMBULATORY_CARE_PROVIDER_SITE_OTHER): Payer: Medicare Other | Admitting: Ophthalmology

## 2011-12-03 DIAGNOSIS — H353290 Exudative age-related macular degeneration, unspecified eye, stage unspecified: Secondary | ICD-10-CM

## 2011-12-03 DIAGNOSIS — H35039 Hypertensive retinopathy, unspecified eye: Secondary | ICD-10-CM

## 2011-12-03 DIAGNOSIS — H43819 Vitreous degeneration, unspecified eye: Secondary | ICD-10-CM

## 2011-12-03 DIAGNOSIS — H3530 Unspecified macular degeneration: Secondary | ICD-10-CM

## 2011-12-03 DIAGNOSIS — H2510 Age-related nuclear cataract, unspecified eye: Secondary | ICD-10-CM

## 2012-01-07 ENCOUNTER — Encounter (INDEPENDENT_AMBULATORY_CARE_PROVIDER_SITE_OTHER): Payer: Medicare Other | Admitting: Ophthalmology

## 2012-01-07 DIAGNOSIS — I10 Essential (primary) hypertension: Secondary | ICD-10-CM

## 2012-01-07 DIAGNOSIS — H353290 Exudative age-related macular degeneration, unspecified eye, stage unspecified: Secondary | ICD-10-CM

## 2012-01-07 DIAGNOSIS — H3530 Unspecified macular degeneration: Secondary | ICD-10-CM

## 2012-01-07 DIAGNOSIS — H35039 Hypertensive retinopathy, unspecified eye: Secondary | ICD-10-CM

## 2012-01-07 DIAGNOSIS — H43819 Vitreous degeneration, unspecified eye: Secondary | ICD-10-CM

## 2012-01-07 DIAGNOSIS — H2510 Age-related nuclear cataract, unspecified eye: Secondary | ICD-10-CM

## 2012-01-31 DIAGNOSIS — Z23 Encounter for immunization: Secondary | ICD-10-CM | POA: Diagnosis not present

## 2012-02-11 ENCOUNTER — Encounter (INDEPENDENT_AMBULATORY_CARE_PROVIDER_SITE_OTHER): Payer: Medicare Other | Admitting: Ophthalmology

## 2012-02-11 DIAGNOSIS — H2510 Age-related nuclear cataract, unspecified eye: Secondary | ICD-10-CM

## 2012-02-11 DIAGNOSIS — H35039 Hypertensive retinopathy, unspecified eye: Secondary | ICD-10-CM

## 2012-02-11 DIAGNOSIS — H3530 Unspecified macular degeneration: Secondary | ICD-10-CM

## 2012-02-11 DIAGNOSIS — I10 Essential (primary) hypertension: Secondary | ICD-10-CM

## 2012-02-11 DIAGNOSIS — H43819 Vitreous degeneration, unspecified eye: Secondary | ICD-10-CM

## 2012-02-11 DIAGNOSIS — H353290 Exudative age-related macular degeneration, unspecified eye, stage unspecified: Secondary | ICD-10-CM

## 2012-03-07 ENCOUNTER — Other Ambulatory Visit: Payer: Self-pay | Admitting: Family Medicine

## 2012-03-07 DIAGNOSIS — E7800 Pure hypercholesterolemia, unspecified: Secondary | ICD-10-CM | POA: Diagnosis not present

## 2012-03-07 DIAGNOSIS — I10 Essential (primary) hypertension: Secondary | ICD-10-CM | POA: Diagnosis not present

## 2012-03-07 DIAGNOSIS — Z0000 Encounter for general adult medical examination without abnormal findings: Secondary | ICD-10-CM | POA: Diagnosis not present

## 2012-03-07 DIAGNOSIS — Z1231 Encounter for screening mammogram for malignant neoplasm of breast: Secondary | ICD-10-CM

## 2012-03-07 DIAGNOSIS — D649 Anemia, unspecified: Secondary | ICD-10-CM | POA: Diagnosis not present

## 2012-03-07 DIAGNOSIS — R413 Other amnesia: Secondary | ICD-10-CM | POA: Diagnosis not present

## 2012-03-17 ENCOUNTER — Encounter (INDEPENDENT_AMBULATORY_CARE_PROVIDER_SITE_OTHER): Payer: Medicare Other | Admitting: Ophthalmology

## 2012-03-17 DIAGNOSIS — H43819 Vitreous degeneration, unspecified eye: Secondary | ICD-10-CM

## 2012-03-17 DIAGNOSIS — H35039 Hypertensive retinopathy, unspecified eye: Secondary | ICD-10-CM

## 2012-03-17 DIAGNOSIS — H3530 Unspecified macular degeneration: Secondary | ICD-10-CM

## 2012-03-17 DIAGNOSIS — H353290 Exudative age-related macular degeneration, unspecified eye, stage unspecified: Secondary | ICD-10-CM

## 2012-03-17 DIAGNOSIS — I10 Essential (primary) hypertension: Secondary | ICD-10-CM

## 2012-04-08 DIAGNOSIS — R413 Other amnesia: Secondary | ICD-10-CM | POA: Diagnosis not present

## 2012-04-08 DIAGNOSIS — R259 Unspecified abnormal involuntary movements: Secondary | ICD-10-CM | POA: Diagnosis not present

## 2012-04-09 ENCOUNTER — Other Ambulatory Visit: Payer: Self-pay | Admitting: Diagnostic Neuroimaging

## 2012-04-09 DIAGNOSIS — R413 Other amnesia: Secondary | ICD-10-CM

## 2012-04-09 DIAGNOSIS — R4182 Altered mental status, unspecified: Secondary | ICD-10-CM | POA: Diagnosis not present

## 2012-04-09 DIAGNOSIS — R251 Tremor, unspecified: Secondary | ICD-10-CM

## 2012-04-09 DIAGNOSIS — F0390 Unspecified dementia without behavioral disturbance: Secondary | ICD-10-CM | POA: Diagnosis not present

## 2012-04-17 ENCOUNTER — Encounter (INDEPENDENT_AMBULATORY_CARE_PROVIDER_SITE_OTHER): Payer: Medicare Other | Admitting: Ophthalmology

## 2012-04-17 DIAGNOSIS — H35039 Hypertensive retinopathy, unspecified eye: Secondary | ICD-10-CM

## 2012-04-17 DIAGNOSIS — I10 Essential (primary) hypertension: Secondary | ICD-10-CM

## 2012-04-17 DIAGNOSIS — H353290 Exudative age-related macular degeneration, unspecified eye, stage unspecified: Secondary | ICD-10-CM

## 2012-04-17 DIAGNOSIS — H3530 Unspecified macular degeneration: Secondary | ICD-10-CM

## 2012-04-17 DIAGNOSIS — H43819 Vitreous degeneration, unspecified eye: Secondary | ICD-10-CM

## 2012-04-17 DIAGNOSIS — H2510 Age-related nuclear cataract, unspecified eye: Secondary | ICD-10-CM

## 2012-04-18 ENCOUNTER — Ambulatory Visit
Admission: RE | Admit: 2012-04-18 | Discharge: 2012-04-18 | Disposition: A | Discharge status: Home / Self Care | Payer: Medicare Other | Source: Ambulatory Visit | Attending: Diagnostic Neuroimaging | Admitting: Diagnostic Neuroimaging

## 2012-04-18 DIAGNOSIS — R413 Other amnesia: Secondary | ICD-10-CM | POA: Diagnosis not present

## 2012-04-18 DIAGNOSIS — R259 Unspecified abnormal involuntary movements: Secondary | ICD-10-CM | POA: Diagnosis not present

## 2012-04-18 DIAGNOSIS — R251 Tremor, unspecified: Secondary | ICD-10-CM

## 2012-04-21 ENCOUNTER — Ambulatory Visit
Admission: RE | Admit: 2012-04-21 | Discharge: 2012-04-21 | Disposition: A | Discharge status: Home / Self Care | Payer: Medicare Other | Source: Ambulatory Visit | Attending: Family Medicine | Admitting: Family Medicine

## 2012-04-21 DIAGNOSIS — Z1231 Encounter for screening mammogram for malignant neoplasm of breast: Secondary | ICD-10-CM

## 2012-05-20 ENCOUNTER — Encounter (INDEPENDENT_AMBULATORY_CARE_PROVIDER_SITE_OTHER): Payer: Medicare Other | Admitting: Ophthalmology

## 2012-05-20 DIAGNOSIS — H353290 Exudative age-related macular degeneration, unspecified eye, stage unspecified: Secondary | ICD-10-CM

## 2012-05-20 DIAGNOSIS — H3530 Unspecified macular degeneration: Secondary | ICD-10-CM

## 2012-05-20 DIAGNOSIS — H2510 Age-related nuclear cataract, unspecified eye: Secondary | ICD-10-CM

## 2012-05-20 DIAGNOSIS — I10 Essential (primary) hypertension: Secondary | ICD-10-CM

## 2012-05-20 DIAGNOSIS — H35039 Hypertensive retinopathy, unspecified eye: Secondary | ICD-10-CM

## 2012-05-20 DIAGNOSIS — H43819 Vitreous degeneration, unspecified eye: Secondary | ICD-10-CM

## 2012-05-26 ENCOUNTER — Encounter (HOSPITAL_BASED_OUTPATIENT_CLINIC_OR_DEPARTMENT_OTHER): Payer: Self-pay | Admitting: Family Medicine

## 2012-05-26 ENCOUNTER — Emergency Department (HOSPITAL_BASED_OUTPATIENT_CLINIC_OR_DEPARTMENT_OTHER)
Admission: EM | Admit: 2012-05-26 | Discharge: 2012-05-26 | Disposition: A | Discharge status: Home / Self Care | Payer: Medicare Other | Attending: Emergency Medicine | Admitting: Emergency Medicine

## 2012-05-26 ENCOUNTER — Emergency Department (HOSPITAL_BASED_OUTPATIENT_CLINIC_OR_DEPARTMENT_OTHER): Payer: Medicare Other

## 2012-05-26 DIAGNOSIS — I10 Essential (primary) hypertension: Secondary | ICD-10-CM | POA: Insufficient documentation

## 2012-05-26 DIAGNOSIS — R110 Nausea: Secondary | ICD-10-CM | POA: Insufficient documentation

## 2012-05-26 DIAGNOSIS — W010XXA Fall on same level from slipping, tripping and stumbling without subsequent striking against object, initial encounter: Secondary | ICD-10-CM | POA: Insufficient documentation

## 2012-05-26 DIAGNOSIS — S4980XA Other specified injuries of shoulder and upper arm, unspecified arm, initial encounter: Secondary | ICD-10-CM | POA: Diagnosis not present

## 2012-05-26 DIAGNOSIS — Z79899 Other long term (current) drug therapy: Secondary | ICD-10-CM | POA: Diagnosis not present

## 2012-05-26 DIAGNOSIS — M25519 Pain in unspecified shoulder: Secondary | ICD-10-CM

## 2012-05-26 DIAGNOSIS — R630 Anorexia: Secondary | ICD-10-CM | POA: Insufficient documentation

## 2012-05-26 DIAGNOSIS — Y929 Unspecified place or not applicable: Secondary | ICD-10-CM | POA: Insufficient documentation

## 2012-05-26 DIAGNOSIS — W19XXXA Unspecified fall, initial encounter: Secondary | ICD-10-CM

## 2012-05-26 DIAGNOSIS — E7800 Pure hypercholesterolemia, unspecified: Secondary | ICD-10-CM | POA: Diagnosis not present

## 2012-05-26 DIAGNOSIS — S3981XA Other specified injuries of abdomen, initial encounter: Secondary | ICD-10-CM | POA: Insufficient documentation

## 2012-05-26 DIAGNOSIS — N390 Urinary tract infection, site not specified: Secondary | ICD-10-CM

## 2012-05-26 DIAGNOSIS — Y939 Activity, unspecified: Secondary | ICD-10-CM | POA: Insufficient documentation

## 2012-05-26 DIAGNOSIS — IMO0002 Reserved for concepts with insufficient information to code with codable children: Secondary | ICD-10-CM | POA: Insufficient documentation

## 2012-05-26 DIAGNOSIS — S46909A Unspecified injury of unspecified muscle, fascia and tendon at shoulder and upper arm level, unspecified arm, initial encounter: Secondary | ICD-10-CM | POA: Insufficient documentation

## 2012-05-26 DIAGNOSIS — M545 Low back pain: Secondary | ICD-10-CM | POA: Diagnosis not present

## 2012-05-26 HISTORY — DX: Pure hypercholesterolemia, unspecified: E78.00

## 2012-05-26 HISTORY — DX: Essential (primary) hypertension: I10

## 2012-05-26 LAB — URINALYSIS, ROUTINE W REFLEX MICROSCOPIC
Bilirubin Urine: NEGATIVE
Ketones, ur: NEGATIVE mg/dL
Nitrite: NEGATIVE
Protein, ur: 30 mg/dL — AB
Urobilinogen, UA: 0.2 mg/dL (ref 0.0–1.0)
pH: 5 (ref 5.0–8.0)

## 2012-05-26 LAB — URINE MICROSCOPIC-ADD ON

## 2012-05-26 MED ORDER — OXYCODONE-ACETAMINOPHEN 5-325 MG PO TABS: 1.0000 | ORAL_TABLET | ORAL | Status: AC | PRN

## 2012-05-26 MED ORDER — CEPHALEXIN 250 MG PO CAPS
250.0000 mg | ORAL_CAPSULE | Freq: Once | ORAL | Status: AC
  Administered 2012-05-26: 250 mg via ORAL
  Filled 2012-05-26: qty 1

## 2012-05-26 MED ORDER — ONDANSETRON 8 MG PO TBDP
8.0000 mg | ORAL_TABLET | Freq: Once | ORAL | Status: AC
  Administered 2012-05-26: 8 mg via ORAL
  Filled 2012-05-26: qty 1

## 2012-05-26 MED ORDER — OXYCODONE-ACETAMINOPHEN 5-325 MG PO TABS
1.0000 | ORAL_TABLET | Freq: Once | ORAL | Status: AC
  Administered 2012-05-26: 1 via ORAL
  Filled 2012-05-26 (×2): qty 1

## 2012-05-26 MED ORDER — CEPHALEXIN 250 MG PO CAPS: 250.0000 mg | ORAL_CAPSULE | Freq: Four times a day (QID) | ORAL | Status: DC

## 2012-05-26 NOTE — ED Notes (Addendum)
Pt c/o fall last Monday from tripping over dog. Pt c/o pain all over. Pt reports left shoulder hurts most.

## 2012-05-26 NOTE — Progress Notes (Signed)
Applied arm sling to patient's left arm.

## 2012-05-26 NOTE — ED Notes (Signed)
Pt changed into gown, waist up. 

## 2012-05-26 NOTE — ED Provider Notes (Signed)
History     CSN: [id number]  Arrival date & time 05/26/12  2440   First MD Initiated Contact with Patient 05/26/12 0745      Chief Complaint  Patient presents with  . Fall  . Generalized Body Aches    (Consider location/radiation/quality/duration/timing/severity/associated sxs/prior treatment) HPI 79 y.o. Female presents with husband and daughter by private vehicle.  Patient fell a week ago after being pulled by dog.  She tripped and fell forward landing on her stomach.  She is complaining of lower abdominal discomfort, nausea, and left shoulder and back pain.  She struck her face but denies headache or loc.  She has not vomited and has not been eating as well as usual but had a sandwich yesterday.  She did not note and bruises or lacerations at time of fall.  She has not been evaluated.  She has taken tylenol with some relief. Her daughter was unaware of episode until she began complaining on Friday- 4 days after fall.  She is ambulatory and lives at home with husband.  Her pmd is Dr. Tenny Craw.   Past Medical History  Diagnosis Date  . Hypertension   . High cholesterol     History reviewed. No pertinent past surgical history.  No family history on file.  History  Substance Use Topics  . Smoking status: Never Smoker   . Smokeless tobacco: Not on file  . Alcohol Use: No    OB History    Grav Para Term Preterm Abortions TAB SAB Ect Mult Living                  Review of Systems  Constitutional: Positive for appetite change.  HENT: Negative for neck pain and neck stiffness.   Eyes: Negative.   Respiratory: Negative for cough and shortness of breath.   Cardiovascular: Negative for chest pain.  Gastrointestinal: Positive for nausea and abdominal pain.  Genitourinary: Negative for dysuria, vaginal bleeding, vaginal discharge and difficulty urinating.  Musculoskeletal: Positive for back pain.  Skin: Negative.   Neurological: Negative.   Hematological: Negative.     Psychiatric/Behavioral: Negative.     Allergies  Review of patient's allergies indicates no known allergies.  Home Medications   Current Outpatient Rx  Name  Route  Sig  Dispense  Refill  . ATORVASTATIN CALCIUM 20 MG PO TABS   Oral   Take 20 mg by mouth daily.         [name] CALCIUM CITRATE 950 MG PO TABS   Oral   Take 1 tablet by mouth daily.         . DONEPEZIL HCL 5 MG PO TABS   Oral   Take 5 mg by mouth at bedtime as needed.         . MULTIVITAMINS PO CAPS   Oral   Take 1 capsule by mouth daily.         [name] NIFEDIPINE ER 90 MG PO TB24   Oral   Take 90 mg by mouth daily.           BP 151/68  Pulse 87  Temp 97.6 F (36.4 C) (Oral)  Resp 16  Ht 5' 3.5" (1.613 m)  Wt 120 lb (54.432 kg)  BMI 20.92 kg/m2  SpO2 97%  Physical Exam  Nursing note and vitals reviewed. Constitutional: She is oriented to person, place, and time. She appears well-developed and well-nourished.  HENT:  Head: Normocephalic and atraumatic.  Right Ear: External ear normal.  Left Ear:  External ear normal.  Nose: Nose normal.  Mouth/Throat: Oropharynx is clear and moist.  Eyes: Conjunctivae normal and EOM are normal. Pupils are equal, round, and reactive to light.  Neck: Normal range of motion. Neck supple. No tracheal deviation present. No thyromegaly present.  Cardiovascular: Normal rate, regular rhythm, normal heart sounds and intact distal pulses.   Pulmonary/Chest: Effort normal and breath sounds normal. She exhibits no tenderness.  Abdominal: Soft. Bowel sounds are normal. She exhibits no distension and no mass. There is no tenderness. There is no rebound and no guarding.  Musculoskeletal: Normal range of motion.       No tenderness to palpation over cervical, thoracic or lumbar spine, perispinal area or ribs.  Mild tenderness over anterior left shoulder and scapula, nontender over humerus and clavicle  Neurological: She is alert and oriented to person, place, and time.  Skin:  Skin is warm and dry.  Psychiatric: She has a normal mood and affect. Her behavior is normal. Thought content normal.    ED Course  Procedures (including critical care time)   Labs Reviewed  URINALYSIS, ROUTINE W REFLEX MICROSCOPIC   No results found.   No diagnosis found.  Results for orders placed during the hospital encounter of 05/26/12  URINALYSIS, ROUTINE W REFLEX MICROSCOPIC      Component Value Range   Color, Urine AMBER (*) YELLOW   APPearance CLOUDY (*) CLEAR   Specific Gravity, Urine 1.025  1.005 - 1.030   pH 5.0  5.0 - 8.0   Glucose, UA NEGATIVE  NEGATIVE mg/dL   Hgb urine dipstick LARGE (*) NEGATIVE   Bilirubin Urine NEGATIVE  NEGATIVE   Ketones, ur NEGATIVE  NEGATIVE mg/dL   Protein, ur 30 (*) NEGATIVE mg/dL   Urobilinogen, UA 0.2  0.0 - 1.0 mg/dL   Nitrite NEGATIVE  NEGATIVE   Leukocytes, UA SMALL (*) NEGATIVE  URINE MICROSCOPIC-ADD ON      Component Value Range   Squamous Epithelial / LPF FEW (*) RARE   WBC, UA 3-6  <3 WBC/hpf   RBC / HPF 3-6  <3 RBC/hpf   Bacteria, UA MANY (*) RARE    Dg Lumbar Spine Complete  05/26/2012  *RADIOLOGY REPORT*  Clinical Data: Low back pain status post fall.  LUMBAR SPINE - COMPLETE 4+ VIEW  Comparison: Lumbar spine MRI 08/02/2008.  Findings: The bones appear mildly demineralized. Trabecular thickening in the sacrum is unchanged.  There are five lumbar type vertebral bodies.  The alignment is stable with a slight anterolisthesis at L3-L4 and L4-L5.  There is dense of acute fracture or pars defect.  Mild disc space loss and facet disease is noted.  There is aorto iliac atherosclerosis without demonstrated aneurysm.  Left pelvic calcifications are unchanged, corresponding with a large dermoid on prior CT.  IMPRESSION: No acute osseous findings or significant malalignment.  Stable osteopenia and spondylosis.   Original Report Authenticated By: Carey Bullocks, M.D.    Gna Rad Results  04/28/2012  Ordered by an unspecified  provider.   Dg Shoulder Left  05/26/2012  *RADIOLOGY REPORT*  Clinical Data: Left shoulder pain status post fall.  LEFT SHOULDER - 2+ VIEW  Comparison: None.  Findings: The bones appear mildly demineralized.  There is no evidence of acute fracture or dislocation.  The subacromial space is preserved.  Aortic atherosclerosis is noted.  IMPRESSION: No evidence of acute fracture or dislocation.   Original Report Authenticated By: Carey Bullocks, M.D.     MDM  79 y.o. Female with  fall a week ago and pain in left shoulder and low abdomen and back.   Probable uti, will treate with keflex.  No evidence of acute bony abnormality- plan sling and orthopedic follow up.       Hilario Quarry, MD 05/26/12 1020

## 2012-05-27 LAB — URINE CULTURE: Colony Count: 9000

## 2012-05-28 DIAGNOSIS — M999 Biomechanical lesion, unspecified: Secondary | ICD-10-CM | POA: Diagnosis not present

## 2012-05-28 DIAGNOSIS — M5137 Other intervertebral disc degeneration, lumbosacral region: Secondary | ICD-10-CM | POA: Diagnosis not present

## 2012-06-02 DIAGNOSIS — M999 Biomechanical lesion, unspecified: Secondary | ICD-10-CM | POA: Diagnosis not present

## 2012-06-02 DIAGNOSIS — M5137 Other intervertebral disc degeneration, lumbosacral region: Secondary | ICD-10-CM | POA: Diagnosis not present

## 2012-06-04 DIAGNOSIS — M999 Biomechanical lesion, unspecified: Secondary | ICD-10-CM | POA: Diagnosis not present

## 2012-06-04 DIAGNOSIS — M5137 Other intervertebral disc degeneration, lumbosacral region: Secondary | ICD-10-CM | POA: Diagnosis not present

## 2012-06-25 ENCOUNTER — Encounter (INDEPENDENT_AMBULATORY_CARE_PROVIDER_SITE_OTHER): Payer: Medicare Other | Admitting: Ophthalmology

## 2012-06-25 DIAGNOSIS — I10 Essential (primary) hypertension: Secondary | ICD-10-CM

## 2012-06-25 DIAGNOSIS — H3530 Unspecified macular degeneration: Secondary | ICD-10-CM

## 2012-06-25 DIAGNOSIS — H35039 Hypertensive retinopathy, unspecified eye: Secondary | ICD-10-CM

## 2012-06-25 DIAGNOSIS — H353290 Exudative age-related macular degeneration, unspecified eye, stage unspecified: Secondary | ICD-10-CM

## 2012-07-08 ENCOUNTER — Encounter (HOSPITAL_COMMUNITY): Payer: Self-pay | Admitting: Physical Medicine and Rehabilitation

## 2012-07-08 ENCOUNTER — Observation Stay (HOSPITAL_COMMUNITY)
Admission: EM | Admit: 2012-07-08 | Discharge: 2012-07-09 | Disposition: A | Discharge status: Home / Self Care | Payer: Medicare Other | Attending: Internal Medicine | Admitting: Internal Medicine

## 2012-07-08 ENCOUNTER — Emergency Department (HOSPITAL_COMMUNITY): Payer: Medicare Other

## 2012-07-08 DIAGNOSIS — R5381 Other malaise: Secondary | ICD-10-CM | POA: Diagnosis not present

## 2012-07-08 DIAGNOSIS — I359 Nonrheumatic aortic valve disorder, unspecified: Secondary | ICD-10-CM | POA: Insufficient documentation

## 2012-07-08 DIAGNOSIS — R55 Syncope and collapse: Secondary | ICD-10-CM | POA: Diagnosis not present

## 2012-07-08 DIAGNOSIS — I10 Essential (primary) hypertension: Secondary | ICD-10-CM | POA: Diagnosis present

## 2012-07-08 DIAGNOSIS — E785 Hyperlipidemia, unspecified: Secondary | ICD-10-CM | POA: Insufficient documentation

## 2012-07-08 DIAGNOSIS — I498 Other specified cardiac arrhythmias: Secondary | ICD-10-CM | POA: Diagnosis not present

## 2012-07-08 DIAGNOSIS — R1013 Epigastric pain: Secondary | ICD-10-CM | POA: Diagnosis present

## 2012-07-08 DIAGNOSIS — I350 Nonrheumatic aortic (valve) stenosis: Secondary | ICD-10-CM | POA: Diagnosis present

## 2012-07-08 DIAGNOSIS — I700 Atherosclerosis of aorta: Secondary | ICD-10-CM | POA: Diagnosis not present

## 2012-07-08 DIAGNOSIS — E876 Hypokalemia: Secondary | ICD-10-CM | POA: Insufficient documentation

## 2012-07-08 DIAGNOSIS — N390 Urinary tract infection, site not specified: Secondary | ICD-10-CM

## 2012-07-08 DIAGNOSIS — R404 Transient alteration of awareness: Secondary | ICD-10-CM | POA: Diagnosis not present

## 2012-07-08 LAB — COMPREHENSIVE METABOLIC PANEL
Alkaline Phosphatase: 101 U/L (ref 39–117)
BUN: 13 mg/dL (ref 6–23)
Calcium: 9.4 mg/dL (ref 8.4–10.5)
GFR calc Af Amer: >90 mL/min (ref >90)
GFR calc non Af Amer: 80 mL/min — ABNORMAL LOW (ref >90)
Glucose, Bld: 101 mg/dL — ABNORMAL HIGH (ref 70–99)
Total Protein: 7.5 g/dL (ref 6.0–8.3)

## 2012-07-08 LAB — URINALYSIS, ROUTINE W REFLEX MICROSCOPIC
Ketones, ur: NEGATIVE mg/dL
Nitrite: NEGATIVE
Specific Gravity, Urine: 1.017 (ref 1.005–1.030)
Urobilinogen, UA: 0.2 mg/dL (ref 0.0–1.0)
pH: 7.5 (ref 5.0–8.0)

## 2012-07-08 LAB — URINE MICROSCOPIC-ADD ON

## 2012-07-08 LAB — CBC WITH DIFFERENTIAL/PLATELET
Eosinophils Absolute: 0.1 10*3/uL (ref 0.0–0.7)
Eosinophils Relative: 1 % (ref 0–5)
HCT: 42.6 % (ref 36.0–46.0)
Hemoglobin: 14.8 g/dL (ref 12.0–15.0)
Lymphs Abs: 1.9 10*3/uL (ref 0.7–4.0)
MCH: 30.4 pg (ref 26.0–34.0)
MCV: 87.5 fL (ref 78.0–100.0)
Monocytes Absolute: 0.6 10*3/uL (ref 0.1–1.0)
Monocytes Relative: 7 % (ref 3–12)
RBC: 4.87 MIL/uL (ref 3.87–5.11)

## 2012-07-08 LAB — POCT I-STAT, CHEM 8
Glucose, Bld: 101 mg/dL — ABNORMAL HIGH (ref 70–99)
HCT: 43 % (ref 36.0–46.0)
Hemoglobin: 14.6 g/dL (ref 12.0–15.0)
Potassium: 3.6 mEq/L (ref 3.5–5.1)
TCO2: 33 mmol/L (ref 0–100)

## 2012-07-08 LAB — CBC
HCT: 40.2 % (ref 36.0–46.0)
Hemoglobin: 13.4 g/dL (ref 12.0–15.0)
MCH: 28.7 pg (ref 26.0–34.0)
MCV: 86.1 fL (ref 78.0–100.0)
RBC: 4.67 MIL/uL (ref 3.87–5.11)

## 2012-07-08 LAB — TROPONIN I: Troponin I: <0.3 ng/mL (ref <0.30)

## 2012-07-08 MED ORDER — ENOXAPARIN SODIUM 40 MG/0.4ML ~~LOC~~ SOLN
40.0000 mg | SUBCUTANEOUS | Status: DC
  Administered 2012-07-09: 40 mg via SUBCUTANEOUS
  Filled 2012-07-08: qty 0.4

## 2012-07-08 MED ORDER — DOCUSATE SODIUM 100 MG PO CAPS
100.0000 mg | ORAL_CAPSULE | Freq: Two times a day (BID) | ORAL | Status: DC
  Administered 2012-07-08 – 2012-07-09 (×2): 100 mg via ORAL
  Filled 2012-07-08 (×4): qty 1

## 2012-07-08 MED ORDER — MULTIVITAMINS PO CAPS
1.0000 | ORAL_CAPSULE | Freq: Every day | ORAL | Status: DC
  Filled 2012-07-08: qty 1

## 2012-07-08 MED ORDER — SODIUM CHLORIDE 0.9 % IJ SOLN: 3.0000 mL | Freq: Two times a day (BID) | INTRAMUSCULAR | Status: DC

## 2012-07-08 MED ORDER — ACETAMINOPHEN 325 MG PO TABS: 650.0000 mg | ORAL_TABLET | Freq: Four times a day (QID) | ORAL | Status: DC | PRN

## 2012-07-08 MED ORDER — PANTOPRAZOLE SODIUM 40 MG IV SOLR
40.0000 mg | Freq: Two times a day (BID) | INTRAVENOUS | Status: DC
  Administered 2012-07-08 (×2): 40 mg via INTRAVENOUS
  Filled 2012-07-08 (×4): qty 40

## 2012-07-08 MED ORDER — ADULT MULTIVITAMIN W/MINERALS CH
1.0000 | ORAL_TABLET | Freq: Every day | ORAL | Status: DC
  Administered 2012-07-09: 1 via ORAL
  Filled 2012-07-08 (×2): qty 1

## 2012-07-08 MED ORDER — CALCIUM CITRATE 950 (200 CA) MG PO TABS
1.0000 | ORAL_TABLET | Freq: Every day | ORAL | Status: DC
  Administered 2012-07-09: 200 mg via ORAL
  Filled 2012-07-08 (×2): qty 1

## 2012-07-08 MED ORDER — DEXTROSE 5 % IV SOLN
1.0000 g | INTRAVENOUS | Status: DC
  Administered 2012-07-08 – 2012-07-09 (×2): 1 g via INTRAVENOUS
  Filled 2012-07-08 (×3): qty 10

## 2012-07-08 MED ORDER — SODIUM CHLORIDE 0.9 % IV SOLN: 250.0000 mL | INTRAVENOUS | Status: DC | PRN

## 2012-07-08 MED ORDER — BESIFLOXACIN HCL 0.6 % OP SUSP: 1.0000 [drp] | Freq: Four times a day (QID) | OPHTHALMIC | Status: DC

## 2012-07-08 MED ORDER — ENOXAPARIN SODIUM 30 MG/0.3ML ~~LOC~~ SOLN
30.0000 mg | SUBCUTANEOUS | Status: DC
  Administered 2012-07-08: 30 mg via SUBCUTANEOUS
  Filled 2012-07-08: qty 0.3

## 2012-07-08 MED ORDER — SODIUM CHLORIDE 0.9 % IV SOLN
INTRAVENOUS | Status: DC
  Administered 2012-07-08: 10:00:00 via INTRAVENOUS

## 2012-07-08 MED ORDER — SODIUM CHLORIDE 0.9 % IJ SOLN
3.0000 mL | Freq: Two times a day (BID) | INTRAMUSCULAR | Status: DC
  Administered 2012-07-09: 3 mL via INTRAVENOUS

## 2012-07-08 MED ORDER — DONEPEZIL HCL 5 MG PO TABS
5.0000 mg | ORAL_TABLET | Freq: Every day | ORAL | Status: DC
  Administered 2012-07-08: 5 mg via ORAL
  Filled 2012-07-08 (×2): qty 1

## 2012-07-08 MED ORDER — SODIUM CHLORIDE 0.9 % IV SOLN: INTRAVENOUS | Status: DC

## 2012-07-08 MED ORDER — ONDANSETRON HCL 4 MG PO TABS: 4.0000 mg | ORAL_TABLET | Freq: Four times a day (QID) | ORAL | Status: DC | PRN

## 2012-07-08 MED ORDER — ONDANSETRON HCL 4 MG/2ML IJ SOLN: 4.0000 mg | Freq: Four times a day (QID) | INTRAMUSCULAR | Status: DC | PRN

## 2012-07-08 MED ORDER — NITROGLYCERIN 0.4 MG SL SUBL: 0.4000 mg | SUBLINGUAL_TABLET | SUBLINGUAL | Status: DC | PRN

## 2012-07-08 MED ORDER — ATORVASTATIN CALCIUM 20 MG PO TABS
20.0000 mg | ORAL_TABLET | Freq: Every day | ORAL | Status: DC
  Administered 2012-07-08 – 2012-07-09 (×2): 20 mg via ORAL
  Filled 2012-07-08 (×2): qty 1

## 2012-07-08 MED ORDER — SODIUM CHLORIDE 0.9 % IJ SOLN: 3.0000 mL | INTRAMUSCULAR | Status: DC | PRN

## 2012-07-08 MED ORDER — NIFEDIPINE ER OSMOTIC RELEASE 90 MG PO TB24
90.0000 mg | ORAL_TABLET | Freq: Every day | ORAL | Status: DC
Start: 2012-07-08 — End: 2012-07-09
  Administered 2012-07-08 – 2012-07-09 (×2): 90 mg via ORAL
  Filled 2012-07-08 (×2): qty 1

## 2012-07-08 NOTE — ED Notes (Signed)
Pt presents to department via [facility] for evaluation of generalized weakness and intermittent epigastric pain. Onset this morning after waking up. Pt states "I had a weird feeling at the top of my stomach." denies pain at the time. States she felt very tired and weak all over. Ambulatory on scene per EMS. Pt is conscious alert and oriented x4.

## 2012-07-08 NOTE — Progress Notes (Signed)
Utilization review completed.  

## 2012-07-08 NOTE — H&P (Addendum)
[facility] History and Physical  Rebecca [name] RUE:[id number] DOB: 1925-07-19 DOA: 07/08/2012  Referring physician: Dr Laurice Record PCP: Daisy Floro, MD  Specialists: None  Chief Complaint: epigastric pain, dizziness.   HPI: Rebecca [name] is a 79 y.o. female PMH significant for HTN, hyperlipidemia who presents to ED complaining of not feeling well. She wake up morning of admission feeling sick, without energy, she felt dizzy. She had epigastric pain, squeeze, last for  few minutes. She denies vomiting, epigastric pain has resolved. She is feeling better. She denies diarrhea, vomiting, dysuria. She is feeling better. She said, my husband think I pass out.  Per ED physician note: " patient husband light her back, patient had her eyes rolled back in her head briefly".    Review of Systems: The patient denies anorexia, fever, weight loss,, vision loss, decreased hearing, hoarseness,  dyspnea on exertion, peripheral edema, balance deficits, hemoptysis, melena, hematochezia,  hematuria, incontinence, genital sores, muscle weakness, suspicious skin lesions, transient blindness, difficulty walking, depression, unusual weight change, abnormal bleeding.    Past Medical History  Diagnosis Date  . Hypertension   . High cholesterol   . Macular degeneration    History reviewed. No pertinent past surgical history. Social History:  reports that she has never smoked. She does not have any smokeless tobacco history on file. She reports that she does not drink alcohol or use illicit drugs. live with husband.    No Known Allergies  Family History : mother died at 28 year old of heart diseases.                            Father; died of MI at 44.   Prior to Admission medications   Medication Sig Start Date End Date Taking? Authorizing Provider  acetaminophen (TYLENOL) 325 MG tablet Take 650 mg by mouth every 6 (six) hours as needed for pain.   Yes Historical Provider, MD  atorvastatin (LIPITOR)  20 MG tablet Take 20 mg by mouth daily.   Yes Historical Provider, MD  Besifloxacin HCl (BESIVANCE) 0.6 % SUSP Place 1 drop into the right eye 4 (four) times daily. For 2 days after eye injection   Yes Historical Provider, MD  calcium citrate (CALCITRATE - DOSED IN MG ELEMENTAL CALCIUM) 950 MG tablet Take 1 tablet by mouth daily.   Yes Historical Provider, MD  donepezil (ARICEPT) 5 MG tablet Take 5 mg by mouth at bedtime.    Yes Historical Provider, MD  Multiple Vitamin (MULTIVITAMIN) capsule Take 1 capsule by mouth daily.   Yes Historical Provider, MD  NIFEdipine (ADALAT CC) 90 MG 24 hr tablet Take 90 mg by mouth daily.   Yes Historical Provider, MD   Physical Exam: Filed Vitals:   07/08/12 1230 07/08/12 1300 07/08/12 1330 07/08/12 1400  BP: 153/67 156/76 158/66 153/63  Pulse: 74 81 73 73  Temp:      TempSrc:      Resp: 16 18 14 16   SpO2: 97% 99% 97% 96%   General Appearance:    Alert, cooperative, no distress, appears stated age  Head:    Normocephalic, without obvious abnormality, atraumatic  Eyes:    PERRL, conjunctiva/corneas clear, EOM's intact,     Ears:    Normal TM's and external ear canals, both ears  Nose:   Nares normal, septum midline, mucosa normal, no drainage    or sinus tenderness  Throat:   Lips, mucosa, and tongue normal; teeth and gums  normal  Neck:   Supple, symmetrical, trachea midline, no adenopathy;    thyroid:  no enlargement/tenderness/nodules; no carotid   bruit or JVD  Back:     Symmetric, no curvature, ROM normal, no CVA tenderness  Lungs:     Clear to auscultation bilaterally, respirations unlabored  Chest Wall:    No tenderness or deformity   Heart:    Regular rate and rhythm, S1 and S2 normal, no murmur, rub   or gallop     Abdomen:     Soft, non-tender, bowel sounds active all four quadrants,    no masses, no organomegaly        Extremities:   Extremities normal, atraumatic, no cyanosis or edema  Pulses:   2+ and symmetric all extremities  Skin:    Skin color, texture, turgor normal, no rashes or lesions  Lymph nodes:   Cervical, supraclavicular, and axillary nodes normal  Neurologic:   CNII-XII intact, normal strength, sensation and reflexes    throughout      Labs on Admission:  Basic Metabolic Panel:  Recent Labs Lab 07/08/12 1017 07/08/12 1035  NA 140 143  K 3.3* 3.6  CL 100 104  CO2 29  --   GLUCOSE 101* 101*  BUN 13 15  CREATININE 0.64 0.80  CALCIUM 9.4  --    Liver Function Tests:  Recent Labs Lab 07/08/12 1017  AST 19  ALT 18  ALKPHOS 101  BILITOT 0.6  PROT 7.5  ALBUMIN 3.9   CBC:  Recent Labs Lab 07/08/12 1017 07/08/12 1035  WBC 8.5  --   NEUTROABS 5.9  --   HGB 14.8 14.6  HCT 42.6 43.0  MCV 87.5  --   PLT 217  --    Cardiac Enzymes: No results found for this basename: CKTOTAL, CKMB, CKMBINDEX, TROPONINI,  in the last 168 hours  BNP (last 3 results) No results found for this basename: PROBNP,  in the last 8760 hours CBG: No results found for this basename: GLUCAP,  in the last 168 hours  Radiological Exams on Admission: Dg Chest 2 View  07/08/2012  *RADIOLOGY REPORT*  Clinical Data: Weakness and epigastric pain; near-syncope  CHEST - 2 VIEW  Comparison: Chest CT January 22, 2007; chest radiograph January 15, 2007  Findings: There is no edema or consolidation.  The heart size and pulmonary vascularity normal.  There is atherosclerotic change in the aorta.  No adenopathy.  There is calcification along the trachea and main bronchi, a finding, in this age group.  IMPRESSION: No edema or consolidation.   Original Report Authenticated By: Bretta Bang, M.D.     EKG: Independently reviewed. Sinus rhythm, Left BBB.   Assessment/Plan Active Problems:   HYPERLIPIDEMIA   HYPERTENSION   Near syncope   Epigastric pain   UTI (lower urinary tract infection)   1-Near Syncope: Admit to telemetry, cycle cardiac enzymes. EKG show Left BBB unclear if new. Will order ECHO. PT consult. Check  orthostatic. Neuro exam non focal.  2-UTI: UA show moderate leukocytes. Will start Ceftriaxone. Follow up urine culture.  3-Epigastric pain: Resolved. She is willing to eat. Protonix.  4-Hypertension: Continue with nifedipine.  5-Hyperlipidemia: Continue with Lipitor.     Code Status: Patient wants to speak with husband regarding Code status.  Family Communication: none at bedside.  Disposition Plan: admit under observation work up for near syncope.   Time spent: 60 minutes.   REGALADO,BELKYS [facility] Pager [phone]  If 7PM-7AM, please contact night-coverage [email or web address]  Password [email or web address] 07/08/2012, 2:36 PM

## 2012-07-08 NOTE — ED Provider Notes (Signed)
History     CSN: [id number]  Arrival date & time 07/08/12  0907   First MD Initiated Contact with Patient 07/08/12 [phone]      Chief Complaint  Patient presents with  . Weakness  . Abdominal Pain    (Consider location/radiation/quality/duration/timing/severity/associated sxs/prior treatment) HPI Comments: He patient is an 79 year old woman who this morning didn't feel well. She had a pain in the epigastric region. She felt very weak and. Her husband light her back and she had her eyes rolled back in her head briefly. Was no precipitating event. She felt fine yesterday. She denies chest pain, nausea and vomiting or diarrhea. She does have a history of high blood pressure, high cholesterol, and bronchiectasis, and chronic sinusitis.  Patient is a 79 y.o. female presenting with weakness and abdominal pain. The history is provided by the patient and a relative.  Weakness This is a new problem. The current episode started 1 to 2 hours ago. Episode frequency: She had one episode of epigastric pain and near-syncope. The problem has been gradually improving. Associated symptoms include abdominal pain. Pertinent negatives include no chest pain. Nothing aggravates the symptoms. Nothing relieves the symptoms. She has tried nothing for the symptoms.  Abdominal Pain Associated symptoms: no chest pain, no chills and no fever     Past Medical History  Diagnosis Date  . Hypertension   . High cholesterol   . Macular degeneration     No past surgical history on file.  No family history on file.  History  Substance Use Topics  . Smoking status: Never Smoker   . Smokeless tobacco: Not on file  . Alcohol Use: No    OB History   Grav Para Term Preterm Abortions TAB SAB Ect Mult Living                  Review of Systems  Constitutional: Negative for fever and chills.       She has a feeling of generalized weakness.  HENT: Negative.   Eyes: Negative.   Respiratory: Negative.    Cardiovascular: Negative.  Negative for chest pain and palpitations.  Gastrointestinal: Positive for abdominal pain.  Genitourinary: Negative.   Musculoskeletal: Negative.   Skin: Negative.   Neurological: Positive for weakness.  Psychiatric/Behavioral: Negative.     Allergies  Review of patient's allergies indicates no known allergies.  Home Medications   Current Outpatient Rx  Name  Route  Sig  Dispense  Refill  . acetaminophen (TYLENOL) 325 MG tablet   Oral   Take 650 mg by mouth every 6 (six) hours as needed for pain.         [name] atorvastatin (LIPITOR) 20 MG tablet   Oral   Take 20 mg by mouth daily.         [name] Besifloxacin HCl (BESIVANCE) 0.6 % SUSP   Right Eye   Place 1 drop into the right eye 4 (four) times daily. For 2 days after eye injection         . calcium citrate (CALCITRATE - DOSED IN MG ELEMENTAL CALCIUM) 950 MG tablet   Oral   Take 1 tablet by mouth daily.         [name] donepezil (ARICEPT) 5 MG tablet   Oral   Take 5 mg by mouth at bedtime.          . Multiple Vitamin (MULTIVITAMIN) capsule   Oral   Take 1 capsule by mouth daily.         [name]  NIFEdipine (ADALAT CC) 90 MG 24 hr tablet   Oral   Take 90 mg by mouth daily.           BP 147/68  Pulse 70  Temp(Src) 98.2 F (36.8 C) (Oral)  Resp 15  SpO2 98%  Physical Exam  Nursing note and vitals reviewed. Constitutional: She is oriented to person, place, and time.  Slender elderly lady, in mild distress, complaining of epigastric pain.  HENT:  Head: Normocephalic and atraumatic.  Right Ear: External ear normal.  Left Ear: External ear normal.  Mouth/Throat: Oropharynx is clear and moist.  Eyes: Pupils are equal, round, and reactive to light. No scleral icterus.  Neck: Normal range of motion. Neck supple.  Cardiovascular: Normal rate, regular rhythm and normal heart sounds.   Pulmonary/Chest: Effort normal and breath sounds normal.  Abdominal: Soft. Bowel sounds are normal.   Localizes pain to the epigastric region. There is no mass rebound or rigidity.  Musculoskeletal: Normal range of motion. She exhibits no edema and no tenderness.  Neurological: She is alert and oriented to person, place, and time.  No sensory or motor deficit.  Skin: Skin is warm and dry.  Psychiatric: She has a normal mood and affect. Her behavior is normal.    ED Course  Procedures (including critical care time)  Labs Reviewed  COMPREHENSIVE METABOLIC PANEL - Abnormal; Notable for the following:    Potassium 3.3 (*)    Glucose, Bld 101 (*)    GFR calc non Af Amer 80 (*)    All other components within normal limits  POCT I-STAT, CHEM 8 - Abnormal; Notable for the following:    Glucose, Bld 101 (*)    Calcium, Ion 1.12 (*)    All other components within normal limits  CBC WITH DIFFERENTIAL  D-DIMER, QUANTITATIVE  URINALYSIS, ROUTINE W REFLEX MICROSCOPIC  POCT I-STAT TROPONIN I   Dg Chest 2 View  07/08/2012  *RADIOLOGY REPORT*  Clinical Data: Weakness and epigastric pain; near-syncope  CHEST - 2 VIEW  Comparison: Chest CT January 22, 2007; chest radiograph January 15, 2007  Findings: There is no edema or consolidation.  The heart size and pulmonary vascularity normal.  There is atherosclerotic change in the aorta.  No adenopathy.  There is calcification along the trachea and main bronchi, a finding, in this age group.  IMPRESSION: No edema or consolidation.   Original Report Authenticated By: Bretta Bang, M.D.    Results for orders placed during the hospital encounter of 07/08/12  CBC WITH DIFFERENTIAL      Result Value Range   WBC 8.5  4.0 - 10.5 K/uL   RBC 4.87  3.87 - 5.11 MIL/uL   Hemoglobin 14.8  12.0 - 15.0 g/dL   HCT 16.1  09.6 - 04.5 %   MCV 87.5  78.0 - 100.0 fL   MCH 30.4  26.0 - 34.0 pg   MCHC 34.7  30.0 - 36.0 g/dL   RDW 40.9  81.1 - 91.4 %   Platelets 217  150 - 400 K/uL   Neutrophils Relative 69  43 - 77 %   Neutro Abs 5.9  1.7 - 7.7 K/uL    Lymphocytes Relative 22  12 - 46 %   Lymphs Abs 1.9  0.7 - 4.0 K/uL   Monocytes Relative 7  3 - 12 %   Monocytes Absolute 0.6  0.1 - 1.0 K/uL   Eosinophils Relative 1  0 - 5 %   Eosinophils Absolute 0.1  0.0 - 0.7 K/uL   Basophils Relative 0  0 - 1 %   Basophils Absolute 0.0  0.0 - 0.1 K/uL  COMPREHENSIVE METABOLIC PANEL      Result Value Range   Sodium 140  135 - 145 mEq/L   Potassium 3.3 (*) 3.5 - 5.1 mEq/L   Chloride 100  96 - 112 mEq/L   CO2 29  19 - 32 mEq/L   Glucose, Bld 101 (*) 70 - 99 mg/dL   BUN 13  6 - 23 mg/dL   Creatinine, Ser 1.61  0.50 - 1.10 mg/dL   Calcium 9.4  8.4 - 09.6 mg/dL   Total Protein 7.5  6.0 - 8.3 g/dL   Albumin 3.9  3.5 - 5.2 g/dL   AST 19  0 - 37 U/L   ALT 18  0 - 35 U/L   Alkaline Phosphatase 101  39 - 117 U/L   Total Bilirubin 0.6  0.3 - 1.2 mg/dL   GFR calc non Af Amer 80 (*) >90 mL/min   GFR calc Af Amer >90  >90 mL/min  D-DIMER, QUANTITATIVE      Result Value Range   D-Dimer, Quant 0.41  0.00 - 0.48 ug/mL-FEU  POCT I-STAT, CHEM 8      Result Value Range   Sodium 143  135 - 145 mEq/L   Potassium 3.6  3.5 - 5.1 mEq/L   Chloride 104  96 - 112 mEq/L   BUN 15  6 - 23 mg/dL   Creatinine, Ser 0.45  0.50 - 1.10 mg/dL   Glucose, Bld 409 (*) 70 - 99 mg/dL   Calcium, Ion 8.11 (*) 1.13 - 1.30 mmol/L   TCO2 33  0 - 100 mmol/L   Hemoglobin 14.6  12.0 - 15.0 g/dL   HCT 91.4  78.2 - 95.6 %  POCT I-STAT TROPONIN I      Result Value Range   Troponin i, poc 0.00  0.00 - 0.08 ng/mL   Comment 3            Dg Chest 2 View  07/08/2012  *RADIOLOGY REPORT*  Clinical Data: Weakness and epigastric pain; near-syncope  CHEST - 2 VIEW  Comparison: Chest CT January 22, 2007; chest radiograph January 15, 2007  Findings: There is no edema or consolidation.  The heart size and pulmonary vascularity normal.  There is atherosclerotic change in the aorta.  No adenopathy.  There is calcification along the trachea and main bronchi, a finding, in this age group.   IMPRESSION: No edema or consolidation.   Original Report Authenticated By: Bretta Bang, M.D.     Course in the ED:  Patient was seen and had physical examination. Her EKG showed a left bundle branch block. Her lab tests were essentially normal. Specifically her troponin I was normal. Her symptoms are gradually resolving, but she did have an episode of near-syncope. I contacted Dr. Sunnie Nielsen,, [facility], and requested her to admit the patient for observation for near-syncope.   1. Near syncope       Carleene Cooper III, MD 07/08/12 1257

## 2012-07-08 NOTE — Progress Notes (Signed)
  Echocardiogram 2D Echocardiogram has been performed.  Georgian Co 07/08/2012, 6:10 PM

## 2012-07-09 DIAGNOSIS — N390 Urinary tract infection, site not specified: Secondary | ICD-10-CM

## 2012-07-09 DIAGNOSIS — R55 Syncope and collapse: Secondary | ICD-10-CM | POA: Diagnosis not present

## 2012-07-09 DIAGNOSIS — I10 Essential (primary) hypertension: Secondary | ICD-10-CM | POA: Diagnosis not present

## 2012-07-09 DIAGNOSIS — I359 Nonrheumatic aortic valve disorder, unspecified: Secondary | ICD-10-CM

## 2012-07-09 DIAGNOSIS — I350 Nonrheumatic aortic (valve) stenosis: Secondary | ICD-10-CM | POA: Diagnosis present

## 2012-07-09 LAB — CBC
MCH: 30.5 pg (ref 26.0–34.0)
MCHC: 34.9 g/dL (ref 30.0–36.0)
MCV: 87.6 fL (ref 78.0–100.0)
Platelets: 222 10*3/uL (ref 150–400)
RDW: 14.2 % (ref 11.5–15.5)

## 2012-07-09 LAB — BASIC METABOLIC PANEL
CO2: 26 mEq/L (ref 19–32)
Calcium: 9 mg/dL (ref 8.4–10.5)
Creatinine, Ser: 0.64 mg/dL (ref 0.50–1.10)
Glucose, Bld: 108 mg/dL — ABNORMAL HIGH (ref 70–99)

## 2012-07-09 LAB — TROPONIN I: Troponin I: <0.3 ng/mL (ref <0.30)

## 2012-07-09 MED ORDER — PANTOPRAZOLE SODIUM 40 MG PO TBEC
40.0000 mg | DELAYED_RELEASE_TABLET | Freq: Two times a day (BID) | ORAL | Status: DC
  Administered 2012-07-09: 40 mg via ORAL
  Filled 2012-07-09: qty 1

## 2012-07-09 MED ORDER — POTASSIUM CHLORIDE CRYS ER 20 MEQ PO TBCR
40.0000 meq | EXTENDED_RELEASE_TABLET | Freq: Once | ORAL | Status: AC
  Administered 2012-07-09: 40 meq via ORAL
  Filled 2012-07-09: qty 2

## 2012-07-09 MED ORDER — CEPHALEXIN 500 MG PO CAPS: 500.0000 mg | ORAL_CAPSULE | Freq: Three times a day (TID) | ORAL | Status: DC

## 2012-07-09 MED ORDER — OMEPRAZOLE 20 MG PO CPDR: 20.0000 mg | DELAYED_RELEASE_CAPSULE | Freq: Every day | ORAL | Status: DC

## 2012-07-09 MED ORDER — ASPIRIN EC 81 MG PO TBEC: 81.0000 mg | DELAYED_RELEASE_TABLET | Freq: Every day | ORAL | Status: DC

## 2012-07-09 NOTE — H&P (Deleted)
[facility] PROGRESS NOTE  Rebecca [name] ZOX:[id number] DOB: [birth year] DOA: 07/08/2012  PCP: Daisy Floro, MD  Brief HPI: Rebecca [name] is a 79 y.o. female PMH significant for HTN, hyperlipidemia who presented to ED complaining of not feeling well. She woke up the morning of admission feeling sick, without energy, she felt dizzy. She had epigastric pain, squeeze, last for few minutes. She denied vomiting. Epigastric pain had resolved by the time she was seen in the ED. She was feeling better. She denied diarrhea, vomiting, dysuria. She stated that her husband thought she was going to pass out. And her husband apparently noted that her eyes rolled back briefly.   Past medical history:  Past Medical History  Diagnosis Date  . Hypertension   . High cholesterol   . Macular degeneration     Consultants: None  Procedures: None  Antibiotics: IV Ceftriaxone 3/11-->  Subjective: Patient feels well. Denies any chest pain or shortness of breath. Denies any pain in abdomen. Says she sometimes feels her heart beating fast when she exerts herself. None currently. No further near syncopal events.  Objective: Vital Signs  Filed Vitals:   07/08/12 1400 07/08/12 1500 07/08/12 2100 07/09/12 0614  BP: 153/63 154/69 167/77 142/80  Pulse: 73 87 89 95  Temp:  97.9 F (36.6 C) 98 F (36.7 C) 97.8 F (36.6 C)  TempSrc:  Oral Oral Oral  Resp: 16 17    Height:  5' 3.5" (1.613 m)    Weight:  55.9 kg (123 lb 3.8 oz)  55.792 kg (123 lb)  SpO2: 96% 98% 98% 97%   No intake or output data in the 24 hours ending 07/09/12 0741 Filed Weights   07/08/12 1500 07/09/12 0614  Weight: 55.9 kg (123 lb 3.8 oz) 55.792 kg (123 lb)    Intake/Output from previous day:    General appearance: alert, cooperative, appears stated age and no distress Head: Normocephalic, without obvious abnormality, atraumatic Eyes: conjunctivae/corneas clear. PERRL, EOM's intact. Resp: clear to auscultation  bilaterally Cardio: S1, S2 normal, no S3 or S4, systolic murmur: early systolic 4/6, crescendo throughout the precordium, no click, no rub and no bruit GI: soft, non-tender; bowel sounds normal; no masses,  no organomegaly Extremities: extremities normal, atraumatic, no cyanosis or edema Skin: Skin color, texture, turgor normal. No rashes or lesions Neurologic: Alert and oriented x 3. No focal deficits.  Lab Results:  Basic Metabolic Panel:  Recent Labs Lab 07/08/12 1017 07/08/12 1035 07/08/12 1544 07/09/12 0218  NA 140 143  --  144  K 3.3* 3.6  --  3.1*  CL 100 104  --  105  CO2 29  --   --  26  GLUCOSE 101* 101*  --  108*  BUN 13 15  --  11  CREATININE 0.64 0.80 0.69 0.64  CALCIUM 9.4  --   --  9.0   Liver Function Tests:  Recent Labs Lab 07/08/12 1017  AST 19  ALT 18  ALKPHOS 101  BILITOT 0.6  PROT 7.5  ALBUMIN 3.9   No results found for this basename: LIPASE, AMYLASE,  in the last 168 hours No results found for this basename: AMMONIA,  in the last 168 hours CBC:  Recent Labs Lab 07/08/12 1017 07/08/12 1035 07/08/12 1544 07/09/12 0218  WBC 8.5  --  6.2 9.3  NEUTROABS 5.9  --   --   --   HGB 14.8 14.6 13.4 14.5  HCT 42.6 43.0 40.2 41.6  MCV 87.5  --  86.1 87.6  PLT 217  --  214 222   Cardiac Enzymes:  Recent Labs Lab 07/08/12 1544 07/08/12 2002 07/09/12 0218  TROPONINI <0.30 <0.30 <0.30    Studies/Results: Dg Chest 2 View  07/08/2012  *RADIOLOGY REPORT*  Clinical Data: Weakness and epigastric pain; near-syncope  CHEST - 2 VIEW  Comparison: Chest CT January 22, 2007; chest radiograph January 15, 2007  Findings: There is no edema or consolidation.  The heart size and pulmonary vascularity normal.  There is atherosclerotic change in the aorta.  No adenopathy.  There is calcification along the trachea and main bronchi, a finding, in this age group.  IMPRESSION: No edema or consolidation.   Original Report Authenticated By: Bretta Bang, M.D.      Medications:  Scheduled: . atorvastatin  20 mg Oral Daily  . calcium citrate  1 tablet Oral Daily  . cefTRIAXone (ROCEPHIN)  IV  1 g Intravenous Q24H  . docusate sodium  100 mg Oral BID  . donepezil  5 mg Oral QHS  . enoxaparin (LOVENOX) injection  40 mg Subcutaneous Q24H  . multivitamin with minerals  1 tablet Oral Daily  . NIFEdipine  90 mg Oral Daily  . pantoprazole (PROTONIX) IV  40 mg Intravenous Q12H  . potassium chloride  40 mEq Oral Once  . sodium chloride  3 mL Intravenous Q12H  . sodium chloride  3 mL Intravenous Q12H   Continuous:  ZOX:WRUEAV chloride, acetaminophen, nitroGLYCERIN, ondansetron (ZOFRAN) IV, ondansetron, sodium chloride  Assessment/Plan:  Principal Problem:   Near syncope Active Problems:   HYPERLIPIDEMIA   HYPERTENSION   Epigastric pain   UTI (lower urinary tract infection)    Near Syncope  Patient feels well. No further episodes. Etiology remains unclear. She does have a systolic murmur and could have significant valvular disease. EKG shows non specific changes. Will repeat. Will wait for [facility] report. Troponin have been negative. Await PT eval.  Possible UTI  Continue ceftriaxone. Await cultures.   Epigastric pain  Unclear etiology. LFT normal. Non tender currently. Tolerating diet.   Transient Sinus tachycardia Asymptomatic. D dimer normal. Check TSH. PCP can follow results.  Hypokalemia Replete. Check Mag.  Hypertension  BP stable. Continue with nifedipine.   Hx of Hyperlipidemia  Continue with Lipitor.   Code Status Full Code for now. Patient wanted to discuss with husband.  DVT Prophylaxis Enoxaparin  Family Communication: Husband to come by later.  Disposition Plan: Possible discharge later today depending on work up.    LOS: 1 day   [facility]  [facility] Pager [phone] 07/09/2012, 7:41 AM  If 8PM-8AM, please contact night-coverage at [email or web address], password [facility]

## 2012-07-09 NOTE — Progress Notes (Signed)
PT Eval Cancellation Note  Patient Details Name: Rebecca [name] MRN: [id number] DOB: 08/10/1925   Cancelled Treatment:    Reason Eval/Treat Not Completed: Other (comment) (pt politely declined evaluation-- "I feel back to normal and)   Sign off at this time. 07/09/2012  Zumbrota Bing, PT [phone] (pager)   Deveron Shamoon, Eliseo Gum 07/09/2012, 1:56 PM

## 2012-07-09 NOTE — Progress Notes (Signed)
[facility] PROGRESS NOTE  Rebecca [name] WJX:[id number] DOB: 1926/02/09 DOA: 07/08/2012  PCP: Rebecca Floro, MD  Brief HPI: Rebecca [name] is a 79 y.o. female PMH significant for HTN, hyperlipidemia who presented to ED complaining of not feeling well. She woke up the morning of admission feeling sick, without energy, she felt dizzy. She had epigastric pain, squeeze, last for few minutes. She denied vomiting. Epigastric pain had resolved by the time she was seen in the ED. She was feeling better. She denied diarrhea, vomiting, dysuria. She stated that her husband thought she was going to pass out. And her husband apparently noted that her eyes rolled back briefly.   Past medical history:  Past Medical History  Diagnosis Date  . Hypertension   . High cholesterol   . Macular degeneration     Consultants: None  Procedures: None  Antibiotics: IV Ceftriaxone 3/11-->  Subjective: Patient feels well. Denies any chest pain or shortness of breath. Denies any pain in abdomen. Says she sometimes feels her heart beating fast when she exerts herself. None currently. No further near syncopal events.   Objective: Vital Signs  Filed Vitals:   07/08/12 1500 07/08/12 2100 07/09/12 0614 07/09/12 1054  BP: 154/69 167/77 142/80 143/76  Pulse: 87 89 95   Temp: 97.9 F (36.6 C) 98 F (36.7 C) 97.8 F (36.6 C)   TempSrc: Oral Oral Oral   Resp: 17     Height: 5' 3.5" (1.613 m)     Weight: 55.9 kg (123 lb 3.8 oz)  55.792 kg (123 lb)   SpO2: 98% 98% 97%    No intake or output data in the 24 hours ending 07/09/12 1119 Filed Weights   07/08/12 1500 07/09/12 0614  Weight: 55.9 kg (123 lb 3.8 oz) 55.792 kg (123 lb)    General appearance: alert, cooperative, appears stated age and no distress  Head: Normocephalic, without obvious abnormality, atraumatic  Eyes: conjunctivae/corneas clear. PERRL, EOM's intact.  Resp: clear to auscultation bilaterally  Cardio: S1, S2 normal, no S3 or S4,  systolic murmur: early systolic 4/6, crescendo throughout the precordium, no click, no rub and no bruit  GI: soft, non-tender; bowel sounds normal; no masses, no organomegaly  Extremities: extremities normal, atraumatic, no cyanosis or edema  Skin: Skin color, texture, turgor normal. No rashes or lesions  Neurologic: Alert and oriented x 3. No focal deficits.   Lab Results:  Basic Metabolic Panel:  Recent Labs Lab 07/08/12 1017 07/08/12 1035 07/08/12 1544 07/09/12 0218 07/09/12 0858  NA 140 143  --  144  --   K 3.3* 3.6  --  3.1*  --   CL 100 104  --  105  --   CO2 29  --   --  26  --   GLUCOSE 101* 101*  --  108*  --   BUN 13 15  --  11  --   CREATININE 0.64 0.80 0.69 0.64  --   CALCIUM 9.4  --   --  9.0  --   MG  --   --   --   --  2.1   Liver Function Tests:  Recent Labs Lab 07/08/12 1017  AST 19  ALT 18  ALKPHOS 101  BILITOT 0.6  PROT 7.5  ALBUMIN 3.9   CBC:  Recent Labs Lab 07/08/12 1017 07/08/12 1035 07/08/12 1544 07/09/12 0218  WBC 8.5  --  6.2 9.3  NEUTROABS 5.9  --   --   --  HGB 14.8 14.6 13.4 14.5  HCT 42.6 43.0 40.2 41.6  MCV 87.5  --  86.1 87.6  PLT 217  --  214 222   Cardiac Enzymes:  Recent Labs Lab 07/08/12 1544 07/08/12 2002 07/09/12 0218  TROPONINI <0.30 <0.30 <0.30     Studies/Results: Dg Chest 2 View  07/08/2012  *RADIOLOGY REPORT*  Clinical Data: Weakness and epigastric pain; near-syncope  CHEST - 2 VIEW  Comparison: Chest CT January 22, 2007; chest radiograph January 15, 2007  Findings: There is no edema or consolidation.  The heart size and pulmonary vascularity normal.  There is atherosclerotic change in the aorta.  No adenopathy.  There is calcification along the trachea and main bronchi, a finding, in this age group.  IMPRESSION: No edema or consolidation.   Original Report Authenticated By: Bretta Bang, M.D.     Medications:  Scheduled: . atorvastatin  20 mg Oral Daily  . calcium citrate  1 tablet Oral  Daily  . cefTRIAXone (ROCEPHIN)  IV  1 g Intravenous Q24H  . docusate sodium  100 mg Oral BID  . donepezil  5 mg Oral QHS  . enoxaparin (LOVENOX) injection  40 mg Subcutaneous Q24H  . multivitamin with minerals  1 tablet Oral Daily  . NIFEdipine  90 mg Oral Daily  . pantoprazole  40 mg Oral BID  . sodium chloride  3 mL Intravenous Q12H  . sodium chloride  3 mL Intravenous Q12H   Continuous:  ZOX:WRUEAV chloride, acetaminophen, nitroGLYCERIN, ondansetron (ZOFRAN) IV, ondansetron, sodium chloride  Assessment/Plan:  Principal Problem:   Near syncope Active Problems:   HYPERLIPIDEMIA   HYPERTENSION   Epigastric pain   UTI (lower urinary tract infection)    Near Syncope  Patient feels well. No further episodes. Etiology remains unclear. She does have a systolic murmur and could have significant valvular disease. EKG shows non specific changes. Will repeat. Will wait for [facility] report. Troponin have been negative. Await PT eval.   Possible UTI  Continue ceftriaxone. Await cultures.   Epigastric pain  Unclear etiology. LFT normal. Non tender currently. Tolerating diet.   Transient Sinus tachycardia  Asymptomatic. D dimer normal. Check TSH. PCP can follow results.   Hypokalemia  Replete. Check Mag.   Hypertension  BP stable. Continue with nifedipine.   Hx of Hyperlipidemia  Continue with Lipitor.   Code Status  Full Code for now. Patient wanted to discuss with husband.   DVT Prophylaxis  Enoxaparin   Family Communication: Husband to come by later.  Disposition Plan: Possible discharge later today depending on work up.     LOS: 1 day   [facility]  [facility] Pager [phone] 07/09/2012, 11:19 AM  If 8PM-8AM, please contact night-coverage at [email or web address], password [facility]

## 2012-07-09 NOTE — Progress Notes (Signed)
Pt HR reaches 140's with activity. Pt has no complaints of chest pain at this time. MD on-call paged and notified. No new orders given. Will continue to monitor.  Harless Litten, RN 07/09/12

## 2012-07-09 NOTE — Discharge Summary (Signed)
[facility]  Physician Discharge Summary   Patient ID: Rebecca [name] MRN: [id number] DOB/AGE: 07-Jan-1926 79 y.o.  Admit date: 07/08/2012 Discharge date: 07/09/2012  PCP: Daisy Floro, MD  DISCHARGE DIAGNOSES:  Active Problems:   HYPERLIPIDEMIA   HYPERTENSION   UTI (lower urinary tract infection)   Aortic stenosis   RECOMMENDATIONS FOR OUTPATIENT FOLLOW UP: 1. Needs follow up for near syncope 2. Have arranged appointment with Dr. P [name] for mod to severe aortic stenosis 3. Please follow up TSH  DISCHARGE CONDITION: fair  Diet recommendation: Low Sodium  Filed Weights   07/08/12 1500 07/09/12 0614  Weight: 55.9 kg (123 lb 3.8 oz) 55.792 kg (123 lb)    INITIAL HISTORY: Rebecca [name] is a 79 y.o. female PMH significant for HTN, hyperlipidemia who presented to ED complaining of not feeling well. She woke up the morning of admission feeling sick, without energy, she felt dizzy. She had epigastric pain, squeeze, last for few minutes. She denied vomiting. Epigastric pain had resolved by the time she was seen in the ED. She was feeling better. She denied diarrhea, vomiting, dysuria. She stated that her husband thought she was going to pass out. And her husband apparently noted that her eyes rolled back briefly.   Consultations:  None  Procedures: 2D ECHO 07/08/12 Study Conclusions  - Left ventricle: The cavity size was normal. There was mild focal basal hypertrophy of the septum. Systolic function was normal. The estimated ejection fraction was in the range of 55% to 60%. Regional wall motion abnormalities cannot be excluded. - Ventricular septum: Septal motion showed paradox. - Aortic valve: There was moderate to severe stenosis. Mild regurgitation. Valve area: 0.74cm^2(VTI). Valve area: 0.68cm^2 (Vmax). - Mitral valve: Calcified annulus. Mildly thickened leaflets. Mild regurgitation. - Pulmonary arteries: PA peak pressure: 31mm Hg (S).   HOSPITAL COURSE:    Near Syncope  Patient's symptoms resolved. She has been ambulating here with no difficulties. Work up has revealed moderate to severe aortic stenosis. Since she is asymptomatic currently, this can be pursued as an outpatient. Follow up has been arranged with Dr. [name]. She will need to see her PCP next week. EKG showed LBBB and other non specific changes. Repeat EKG shows stable changes. Troponin was normal. Can pursue work up as outpatient.   Possible UTI  Urine cultures are pending. Discharge on Keflex.  Epigastric pain  Unclear etiology. LFT normal. Non tender currently. Tolerating diet. Possibly due to above.   Transient Sinus tachycardia  Asymptomatic. D dimer normal. TSH has been drawn and is pending. PCP can follow results.   Hypokalemia  Repleted today. Magnesium was ok.   Hypertension  BP stable. Continue with nifedipine.   Hx of Hyperlipidemia  Continue with Lipitor.   Overall patient remains stable. She is keen on going home. Discussed above with patient and her husband. They agree with plan. She will seek attention if she has recurrence of symptoms or develops new symptoms.    PERTINENT LABS:  The results of significant diagnostics from this hospitalization (including imaging, microbiology, ancillary and laboratory) are listed below for reference.     Labs: Basic Metabolic Panel:  Recent Labs Lab 07/08/12 1017 07/08/12 1035 07/08/12 1544 07/09/12 0218 07/09/12 0858  NA 140 143  --  144  --   K 3.3* 3.6  --  3.1*  --   CL 100 104  --  105  --   CO2 29  --   --  26  --   GLUCOSE  101* 101*  --  108*  --   BUN 13 15  --  11  --   CREATININE 0.64 0.80 0.69 0.64  --   CALCIUM 9.4  --   --  9.0  --   MG  --   --   --   --  2.1   Liver Function Tests:  Recent Labs Lab 07/08/12 1017  AST 19  ALT 18  ALKPHOS 101  BILITOT 0.6  PROT 7.5  ALBUMIN 3.9   CBC:  Recent Labs Lab 07/08/12 1017 07/08/12 1035 07/08/12 1544 07/09/12 0218  WBC 8.5  --   6.2 9.3  NEUTROABS 5.9  --   --   --   HGB 14.8 14.6 13.4 14.5  HCT 42.6 43.0 40.2 41.6  MCV 87.5  --  86.1 87.6  PLT 217  --  214 222   Cardiac Enzymes:  Recent Labs Lab 07/08/12 1544 07/08/12 2002 07/09/12 0218  TROPONINI <0.30 <0.30 <0.30    IMAGING STUDIES Dg Chest 2 View  07/08/2012  *RADIOLOGY REPORT*  Clinical Data: Weakness and epigastric pain; near-syncope  CHEST - 2 VIEW  Comparison: Chest CT January 22, 2007; chest radiograph January 15, 2007  Findings: There is no edema or consolidation.  The heart size and pulmonary vascularity normal.  There is atherosclerotic change in the aorta.  No adenopathy.  There is calcification along the trachea and main bronchi, a finding, in this age group.  IMPRESSION: No edema or consolidation.   Original Report Authenticated By: Bretta Bang, M.D.     DISCHARGE EXAMINATION: See progress note from earlier today  DISPOSITION: Home  Discharge Orders   Future Appointments Provider Department Dept Phone   07/23/2012 9:30 AM Sherrie George, MD [facility] [phone]   08/01/2012 8:45 AM [name] [name], MD [facility] Main Office [place]) [phone]   Future Orders Complete By Expires     Diet - low sodium heart healthy  As directed     Discharge instructions  As directed     Comments:      You have been made an appointment with Dr. [name] on April 4th for the aortic stenosis. If you develop the symptoms again, feel dizzy, have chest pain, become short or breath or any other discomfort seek attention immediately.    Increase activity slowly  As directed     Comments:      No exertion till seen by Dr. [name]      Current Discharge Medication List    START taking these medications   Details  aspirin EC 81 MG tablet Take 1 tablet (81 mg total) by mouth daily. Qty: 30 tablet, Refills: 0    cephALEXin (KEFLEX) 500 MG capsule Take 1 capsule (500 mg total) by mouth 3 (three) times daily. Qty: 15  capsule, Refills: 0    omeprazole (PRILOSEC) 20 MG capsule Take 1 capsule (20 mg total) by mouth daily. Qty: 30 capsule, Refills: 0      CONTINUE these medications which have NOT CHANGED   Details  acetaminophen (TYLENOL) 325 MG tablet Take 650 mg by mouth every 6 (six) hours as needed for pain.    atorvastatin (LIPITOR) 20 MG tablet Take 20 mg by mouth daily.    Besifloxacin HCl (BESIVANCE) 0.6 % SUSP Place 1 drop into the right eye 4 (four) times daily. For 2 days after eye injection    calcium citrate (CALCITRATE - DOSED IN MG ELEMENTAL CALCIUM) 950  MG tablet Take 1 tablet by mouth daily.    donepezil (ARICEPT) 5 MG tablet Take 5 mg by mouth at bedtime.     Multiple Vitamin (MULTIVITAMIN) capsule Take 1 capsule by mouth daily.    NIFEdipine (ADALAT CC) 90 MG 24 hr tablet Take 90 mg by mouth daily.       Follow-up Information   Follow up with Peter [name], MD. (You have an appointment on April 4th)    Contact information:   [address] [place] [address] [phone]       Follow up with Daisy Floro, MD. Schedule an appointment as soon as possible for a visit in 1 week. (post hospitalization follow up)    Contact information:   [address] [place] [address] [phone]       TOTAL DISCHARGE TIME: 35 mins  [facility]  [facility] Pager [phone]  07/09/2012, 1:23 PM

## 2012-07-10 LAB — URINE CULTURE: Colony Count: 80000

## 2012-07-22 DIAGNOSIS — I359 Nonrheumatic aortic valve disorder, unspecified: Secondary | ICD-10-CM | POA: Diagnosis not present

## 2012-07-22 DIAGNOSIS — R55 Syncope and collapse: Secondary | ICD-10-CM | POA: Diagnosis not present

## 2012-07-23 ENCOUNTER — Encounter (INDEPENDENT_AMBULATORY_CARE_PROVIDER_SITE_OTHER): Payer: Medicare Other | Admitting: Ophthalmology

## 2012-07-23 DIAGNOSIS — H43819 Vitreous degeneration, unspecified eye: Secondary | ICD-10-CM

## 2012-07-23 DIAGNOSIS — H3530 Unspecified macular degeneration: Secondary | ICD-10-CM

## 2012-07-23 DIAGNOSIS — H35039 Hypertensive retinopathy, unspecified eye: Secondary | ICD-10-CM

## 2012-07-23 DIAGNOSIS — H2510 Age-related nuclear cataract, unspecified eye: Secondary | ICD-10-CM

## 2012-07-23 DIAGNOSIS — H353290 Exudative age-related macular degeneration, unspecified eye, stage unspecified: Secondary | ICD-10-CM

## 2012-07-23 DIAGNOSIS — I10 Essential (primary) hypertension: Secondary | ICD-10-CM | POA: Diagnosis not present

## 2012-08-01 ENCOUNTER — Institutional Professional Consult (permissible substitution): Payer: Medicare Other | Admitting: Cardiology

## 2012-08-21 ENCOUNTER — Encounter: Payer: Self-pay | Admitting: Cardiology

## 2012-08-21 ENCOUNTER — Ambulatory Visit (INDEPENDENT_AMBULATORY_CARE_PROVIDER_SITE_OTHER): Payer: Medicare Other

## 2012-08-21 ENCOUNTER — Ambulatory Visit (INDEPENDENT_AMBULATORY_CARE_PROVIDER_SITE_OTHER): Payer: Medicare Other | Admitting: Cardiology

## 2012-08-21 VITALS — BP 144/70 | HR 82 | Ht 63.5 in | Wt 120.4 lb

## 2012-08-21 DIAGNOSIS — I10 Essential (primary) hypertension: Secondary | ICD-10-CM | POA: Diagnosis not present

## 2012-08-21 DIAGNOSIS — I350 Nonrheumatic aortic (valve) stenosis: Secondary | ICD-10-CM

## 2012-08-21 DIAGNOSIS — E785 Hyperlipidemia, unspecified: Secondary | ICD-10-CM | POA: Diagnosis not present

## 2012-08-21 DIAGNOSIS — R55 Syncope and collapse: Secondary | ICD-10-CM | POA: Diagnosis not present

## 2012-08-21 DIAGNOSIS — I359 Nonrheumatic aortic valve disorder, unspecified: Secondary | ICD-10-CM | POA: Diagnosis not present

## 2012-08-21 HISTORY — DX: Syncope and collapse: R55

## 2012-08-21 NOTE — Progress Notes (Signed)
Rebecca [name] Date of Birth: 04/23/1926 Medical Record #[id number]  History of Present Illness: Rebecca [name] is seen at the request of Dr. Tenny Craw for evaluation of aortic stenosis. She is a very pleasant 79 year old white female who was recently admitted on 07/08/2012 to the hospital for an episode of near syncope. Her husband states that she was making up her bed when she felt a woozy and sat down on the bed. He states that she subsequently blacked out in his arms. This was associated with a funny feeling in the epigastric region. She just generally didn't feel well. She was hospitalized and treated for urinary tract infection. She has had no recurrent episodes of dizziness or syncope since then. She is still fairly active doing all her own housework and denies any exertional dizziness, chest pain, or shortness of breath. She does report that she has had a long-standing murmur. During her hospital stay she had an echocardiogram which showed moderate to severe aortic stenosis. Mean gradient was 31 mmHg with a peak gradient of 50 mm of mercury. Valve area was 0.74 cm square.  Current Outpatient Prescriptions on File Prior to Visit  Medication Sig Dispense Refill  . acetaminophen (TYLENOL) 325 MG tablet Take 650 mg by mouth every 6 (six) hours as needed for pain.      [name] aspirin EC 81 MG tablet Take 1 tablet (81 mg total) by mouth daily.  30 tablet  0  . atorvastatin (LIPITOR) 20 MG tablet Take 20 mg by mouth daily.      [name] Besifloxacin HCl (BESIVANCE) 0.6 % SUSP Place 1 drop into the right eye 4 (four) times daily. For 2 days after eye injection      . calcium citrate (CALCITRATE - DOSED IN MG ELEMENTAL CALCIUM) 950 MG tablet Take 1 tablet by mouth daily.      . cephALEXin (KEFLEX) 500 MG capsule Take 1 capsule (500 mg total) by mouth 3 (three) times daily.  15 capsule  0  . donepezil (ARICEPT) 5 MG tablet Take 5 mg by mouth at bedtime.       . Multiple Vitamin (MULTIVITAMIN) capsule Take 1 capsule by  mouth daily.      [name] NIFEdipine (ADALAT CC) 90 MG 24 hr tablet Take 90 mg by mouth daily.      [name] omeprazole (PRILOSEC) 20 MG capsule Take 1 capsule (20 mg total) by mouth daily.  30 capsule  0   No current facility-administered medications on file prior to visit.    No Known Allergies  Past Medical History  Diagnosis Date  . Hypertension   . High cholesterol   . Macular degeneration   . UTI (lower urinary tract infection)   . Aortic stenosis   . Syncope 08/21/2012    History reviewed. No pertinent past surgical history.  History  Smoking status  . Never Smoker   Smokeless tobacco  . Not on file    History  Alcohol Use No    History reviewed. No pertinent family history.  Review of Systems: The review of systems is positive for chronic memory loss. She is on Aricept.  All other systems were reviewed and are negative.  Physical Exam: BP 144/70  Pulse 82  Ht 5' 3.5" (1.613 m)  Wt 120 lb 6.4 oz (54.613 kg)  BMI 20.99 kg/m2  SpO2 99% She is a pleasant elderly white female in no acute distress. HEENT: Normocephalic, atraumatic. Pupils are equal round and reactive to light accommodation. Extraocular movements are  full. Oropharynx is clear. There is good dental care. Neck is supple without JVD, adenopathy, or thyromegaly. Carotid upstrokes are fairly good. Lungs: Clear Cardiovascular: Regular rate and rhythm. Normal S1 with diminished S2. There is a harsh grade 2/6 systolic murmur at the right upper sternal border. Abdomen: Soft and nontender. No masses or bruits. Bowel sounds positive. Extremities: Normal radial, femoral, and pedal pulses. No cyanosis or edema. Skin: Dry Neuro: Alert and oriented x3. Cranial nerves II through XII are intact. LABORATORY DATA: ECG in the hospital showed normal sinus rhythm with a left bundle branch block. Chest x-ray showed no active disease. Troponins were all negative.  Assessment / Plan: 1. Near syncope. I think this is  multifactorial with urinary tract infection, aortic stenosis, and possible dehydration. We will have her wear an event monitor to rule out significant arrhythmia. 2. Aortic stenosis: Moderate to severe. She has no exertional symptoms. We will continue to monitor for now and our recommend a repeat echocardiogram in 6 months. With her age and memory loss she has not an ideal surgical candidate. 3. Hypertension 4. Hyperlipidemia.

## 2012-08-21 NOTE — Progress Notes (Signed)
30 day ECardio event monitor placed on patient.Patient to mail back 09/20/12.

## 2012-08-21 NOTE — Patient Instructions (Signed)
We will have you wear a monitor.  I will see you back in 6 months.

## 2012-08-22 ENCOUNTER — Institutional Professional Consult (permissible substitution): Payer: Medicare Other | Admitting: Cardiology

## 2012-08-22 ENCOUNTER — Encounter: Payer: Self-pay | Admitting: Cardiology

## 2012-08-22 DIAGNOSIS — R55 Syncope and collapse: Secondary | ICD-10-CM | POA: Diagnosis not present

## 2012-08-27 ENCOUNTER — Encounter (INDEPENDENT_AMBULATORY_CARE_PROVIDER_SITE_OTHER): Payer: Medicare Other | Admitting: Ophthalmology

## 2012-08-27 DIAGNOSIS — H3530 Unspecified macular degeneration: Secondary | ICD-10-CM | POA: Diagnosis not present

## 2012-08-27 DIAGNOSIS — I10 Essential (primary) hypertension: Secondary | ICD-10-CM

## 2012-08-27 DIAGNOSIS — H35039 Hypertensive retinopathy, unspecified eye: Secondary | ICD-10-CM | POA: Diagnosis not present

## 2012-08-27 DIAGNOSIS — H43819 Vitreous degeneration, unspecified eye: Secondary | ICD-10-CM

## 2012-08-27 DIAGNOSIS — H353290 Exudative age-related macular degeneration, unspecified eye, stage unspecified: Secondary | ICD-10-CM | POA: Diagnosis not present

## 2012-08-27 DIAGNOSIS — H2510 Age-related nuclear cataract, unspecified eye: Secondary | ICD-10-CM

## 2012-09-30 ENCOUNTER — Telehealth: Payer: Self-pay

## 2012-09-30 ENCOUNTER — Ambulatory Visit (INDEPENDENT_AMBULATORY_CARE_PROVIDER_SITE_OTHER): Payer: Medicare Other | Admitting: Cardiology

## 2012-09-30 ENCOUNTER — Encounter: Payer: Self-pay | Admitting: Cardiology

## 2012-09-30 VITALS — BP 146/80 | HR 67 | Ht 63.0 in | Wt 121.1 lb

## 2012-09-30 DIAGNOSIS — I4891 Unspecified atrial fibrillation: Secondary | ICD-10-CM

## 2012-09-30 DIAGNOSIS — I480 Paroxysmal atrial fibrillation: Secondary | ICD-10-CM

## 2012-09-30 LAB — CBC WITH DIFFERENTIAL/PLATELET
Basophils Relative: 0.4 % (ref 0.0–3.0)
Eosinophils Absolute: 0.1 10*3/uL (ref 0.0–0.7)
MCHC: 33.5 g/dL (ref 30.0–36.0)
MCV: 89.4 fl (ref 78.0–100.0)
Monocytes Absolute: 0.6 10*3/uL (ref 0.1–1.0)
Neutrophils Relative %: 58.4 % (ref 43.0–77.0)
Platelets: 218 10*3/uL (ref 150.0–400.0)
RBC: 4.61 Mil/uL (ref 3.87–5.11)
RDW: 13.9 % (ref 11.5–14.6)

## 2012-09-30 LAB — PROTIME-INR
INR: 1.1 ratio — ABNORMAL HIGH (ref 0.8–1.0)
Prothrombin Time: 11.3 s (ref 10.2–12.4)

## 2012-09-30 LAB — BASIC METABOLIC PANEL
BUN: 17 mg/dL (ref 6–23)
GFR: 58.78 mL/min — ABNORMAL LOW (ref >60.00)
Potassium: 3.9 mEq/L (ref 3.5–5.1)

## 2012-09-30 LAB — HEPATIC FUNCTION PANEL
AST: 27 U/L (ref 0–37)
Total Bilirubin: 0.5 mg/dL (ref 0.3–1.2)

## 2012-09-30 MED ORDER — WARFARIN SODIUM 2.5 MG PO TABS: ORAL_TABLET | ORAL | Status: DC

## 2012-09-30 NOTE — Patient Instructions (Signed)
Will obtain labs today and call you with the results (lp/bmet/hfp/pt/inr)  START COUMADIN (WARFARIN) 2.5 MG DAILY  Return Friday June 6 for blood work in the coumadin clinic  Keep appointment with Dr [name] June 17

## 2012-09-30 NOTE — Telephone Encounter (Signed)
Patient called was told received monitor report which revealed atrial fib.Monitor was shown to DOD Dr.Brackbill he advised patient needs appointment to be evaluated.Appointment scheduled with Dr.Brackbill today at 11:00 am.

## 2012-09-30 NOTE — Progress Notes (Signed)
Quick Note:  Please report to patient. The recent labs are stable. Continue same medication and careful diet. ______ 

## 2012-09-30 NOTE — Progress Notes (Signed)
Rebecca [name] Date of Birth:  Nov 22, 1925 [facility] [address] [place], [place]  [address] [phone]         Fax   [phone]  History of Present Illness: This pleasant 79 year old woman is seen as a work in visit.  She is normally followed by Dr. [name].  She has a history of moderate to severe aortic stenosis with mean gradient of 31 and peak gradient of 50 noted on echocardiogram done in March 2014.  She had been admitted at that time because of syncope.  She was hospitalized and treated for a urinary tract infection.  Since then she has had no further episodes of syncope and she denies any dizziness.  She wore a 30 day event monitor from 08/22/12 until 09/20/12.  The results of that 30 day monitor have become available and shows that she is having frequent episodes of paroxysmal atrial fibrillation.  She was asked to come in for clinical evaluation today.  The patient is physically active.  She denies any chest pain or shortness of breath.  She has not had any further episodes of dizziness or syncope.  She herself is totally unaware of any racing of her heart or palpitations.  She has been in good general health.  She denies any history of recurrent falls or bleeding which would preclude chronic anticoagulation. Current Outpatient Prescriptions  Medication Sig Dispense Refill  . acetaminophen (TYLENOL) 325 MG tablet Take 650 mg by mouth every 6 (six) hours as needed for pain.      [name] aspirin EC 81 MG tablet Take 1 tablet (81 mg total) by mouth daily.  30 tablet  0  . atorvastatin (LIPITOR) 20 MG tablet Take 20 mg by mouth daily.      [name] Besifloxacin HCl (BESIVANCE) 0.6 % SUSP Place 1 drop into the right eye 4 (four) times daily. For 2 days after eye injection      . calcium citrate (CALCITRATE - DOSED IN MG ELEMENTAL CALCIUM) 950 MG tablet Take 1 tablet by mouth daily.      . cephALEXin (KEFLEX) 500 MG capsule Take 1 capsule (500 mg total) by mouth 3 (three) times  daily.  15 capsule  0  . donepezil (ARICEPT) 5 MG tablet Take 5 mg by mouth at bedtime.       . Multiple Vitamin (MULTIVITAMIN) capsule Take 1 capsule by mouth daily.      [name] NIFEdipine (ADALAT CC) 90 MG 24 hr tablet Take 90 mg by mouth daily.      [name] omeprazole (PRILOSEC) 20 MG capsule Take 1 capsule (20 mg total) by mouth daily.  30 capsule  0  . warfarin (COUMADIN) 2.5 MG tablet Or as directed by the coumadin clinic  30 tablet  3   No current facility-administered medications for this visit.    No Known Allergies  Patient Active Problem List   Diagnosis Date Noted  . Syncope 08/21/2012  . Aortic stenosis 07/09/2012  . UTI (lower urinary tract infection) 07/08/2012  . BRONCHIECTASIS 12/09/2007  . SINUSITIS, CHRONIC 07/07/2007  . BRONCHITIS, CHRONIC 07/07/2007  . HYPERLIPIDEMIA 06/09/2007  . HYPERTENSION 06/09/2007  . ALLERGIC RHINITIS 06/09/2007  . OSTEOPOROSIS 06/09/2007    History  Smoking status  . Never Smoker   Smokeless tobacco  . Not on file    History  Alcohol Use No    No family history on file.  Review of Systems: Constitutional: no fever chills diaphoresis or fatigue or change in weight.  Head and neck: no hearing loss, no epistaxis, no photophobia or visual disturbance. Respiratory: No cough, shortness of breath or wheezing. Cardiovascular: No chest pain peripheral edema, palpitations. Gastrointestinal: No abdominal distention, no abdominal pain, no change in bowel habits hematochezia or melena. Genitourinary: No dysuria, no frequency, no urgency, no nocturia. Musculoskeletal:No arthralgias, no back pain, no gait disturbance or myalgias. Neurological: No dizziness, no headaches, no numbness, no seizures, no syncope, no weakness, no tremors. Hematologic: No lymphadenopathy, no easy bruising. Psychiatric: No confusion, no hallucinations, no sleep disturbance.    Physical Exam: Filed Vitals:   09/30/12 1053  BP: 146/80  Pulse: 67   the general  appearance reveals that of a thin active elderly woman in no distress.The head and neck exam reveals pupils equal and reactive.  Extraocular movements are full.  There is no scleral icterus.  The mouth and pharynx are normal.  The neck is supple.  The carotids reveal soft bruits transmitted from the aortic valve up into the neck bilaterally. The jugular venous pressure is normal.  The  thyroid is not enlarged.  There is no lymphadenopathy.  The chest is clear to percussion and auscultation.  There are no rales or rhonchi.  Expansion of the chest is symmetrical.  The precordium is quiet.  The first heart sound is normal.  The second heart sound is physiologically split.  There is no  gallop rub or click.  There is a grade 3/6 harsh musical systolic ejection murmur at the base radiating to the neck .  No diastolic murmur There is no abnormal lift or heave.  The abdomen is soft and nontender.  The bowel sounds are normal.  The liver and spleen are not enlarged.  There are no abdominal masses.  There are no abdominal bruits.  Extremities reveal good pedal pulses.  There is no phlebitis or edema.  There is no cyanosis or clubbing.  Strength is normal and symmetrical in all extremities.  There is no lateralizing weakness.  There are no sensory deficits.  The skin is warm and dry.  There is no rash.   EKG today shows normal sinus rhythm left bundle branch block with left axis deviation.  Since 07/09/12, the ventricular rate is slower.  Assessment / Plan: 1. paroxysmal atrial fibrillation, patient not aware of arrhythmia. 2. moderate to severe valvular aortic stenosis 3. essential hypertension  Plan: Her CHADSS score is 2, for age and hypertension.  I have recommended that she begin warfarin.  We will check baseline labs today and have her followup in the Coumadin clinic later this week.  She will continue her other medicines as is except she will stop her baby aspirin once she is therapeutic on Coumadin.  She will  keep her followup visit already scheduled with Dr. [name] later this month

## 2012-10-01 ENCOUNTER — Encounter (INDEPENDENT_AMBULATORY_CARE_PROVIDER_SITE_OTHER): Payer: Medicare Other | Admitting: Ophthalmology

## 2012-10-01 DIAGNOSIS — H3530 Unspecified macular degeneration: Secondary | ICD-10-CM | POA: Diagnosis not present

## 2012-10-01 DIAGNOSIS — H353290 Exudative age-related macular degeneration, unspecified eye, stage unspecified: Secondary | ICD-10-CM | POA: Diagnosis not present

## 2012-10-01 DIAGNOSIS — H35039 Hypertensive retinopathy, unspecified eye: Secondary | ICD-10-CM

## 2012-10-01 DIAGNOSIS — H43819 Vitreous degeneration, unspecified eye: Secondary | ICD-10-CM

## 2012-10-01 DIAGNOSIS — I10 Essential (primary) hypertension: Secondary | ICD-10-CM | POA: Diagnosis not present

## 2012-10-03 ENCOUNTER — Ambulatory Visit (INDEPENDENT_AMBULATORY_CARE_PROVIDER_SITE_OTHER): Payer: Medicare Other | Admitting: *Deleted

## 2012-10-03 ENCOUNTER — Telehealth: Payer: Self-pay | Admitting: Diagnostic Neuroimaging

## 2012-10-03 DIAGNOSIS — I4891 Unspecified atrial fibrillation: Secondary | ICD-10-CM

## 2012-10-03 DIAGNOSIS — I480 Paroxysmal atrial fibrillation: Secondary | ICD-10-CM | POA: Insufficient documentation

## 2012-10-03 DIAGNOSIS — Z7901 Long term (current) use of anticoagulants: Secondary | ICD-10-CM | POA: Insufficient documentation

## 2012-10-03 NOTE — Patient Instructions (Addendum)

## 2012-10-07 ENCOUNTER — Ambulatory Visit: Payer: Self-pay | Admitting: Diagnostic Neuroimaging

## 2012-10-10 ENCOUNTER — Ambulatory Visit (INDEPENDENT_AMBULATORY_CARE_PROVIDER_SITE_OTHER): Payer: Medicare Other | Admitting: *Deleted

## 2012-10-10 DIAGNOSIS — I4891 Unspecified atrial fibrillation: Secondary | ICD-10-CM | POA: Diagnosis not present

## 2012-10-10 DIAGNOSIS — Z7901 Long term (current) use of anticoagulants: Secondary | ICD-10-CM

## 2012-10-10 LAB — POCT INR: INR: 2.5

## 2012-10-10 NOTE — Addendum Note (Signed)
Addended by: Carmela Hurt on: 10/10/2012 12:22 PM   Modules accepted: Orders, Medications, Level of Service

## 2012-10-14 ENCOUNTER — Ambulatory Visit (INDEPENDENT_AMBULATORY_CARE_PROVIDER_SITE_OTHER): Payer: Medicare Other | Admitting: *Deleted

## 2012-10-14 ENCOUNTER — Encounter: Payer: Self-pay | Admitting: Cardiology

## 2012-10-14 ENCOUNTER — Ambulatory Visit (INDEPENDENT_AMBULATORY_CARE_PROVIDER_SITE_OTHER): Payer: Medicare Other | Admitting: Cardiology

## 2012-10-14 VITALS — BP 144/72 | HR 82 | Ht 63.0 in | Wt 121.4 lb

## 2012-10-14 DIAGNOSIS — I10 Essential (primary) hypertension: Secondary | ICD-10-CM | POA: Diagnosis not present

## 2012-10-14 DIAGNOSIS — I359 Nonrheumatic aortic valve disorder, unspecified: Secondary | ICD-10-CM

## 2012-10-14 DIAGNOSIS — I480 Paroxysmal atrial fibrillation: Secondary | ICD-10-CM

## 2012-10-14 DIAGNOSIS — E785 Hyperlipidemia, unspecified: Secondary | ICD-10-CM

## 2012-10-14 DIAGNOSIS — I350 Nonrheumatic aortic (valve) stenosis: Secondary | ICD-10-CM

## 2012-10-14 DIAGNOSIS — I4891 Unspecified atrial fibrillation: Secondary | ICD-10-CM

## 2012-10-14 DIAGNOSIS — R55 Syncope and collapse: Secondary | ICD-10-CM

## 2012-10-14 DIAGNOSIS — Z7901 Long term (current) use of anticoagulants: Secondary | ICD-10-CM | POA: Diagnosis not present

## 2012-10-14 LAB — POCT INR: INR: 2.1

## 2012-10-14 NOTE — Patient Instructions (Signed)
Continue your current therapy  I will see you in 6 months.   

## 2012-10-15 NOTE — Progress Notes (Signed)
Rebecca [name] Date of Birth: 1925-05-30 Medical Record #[id number]  History of Present Illness: Rebecca [name] is seen for followup today. She has a history of moderate to severe aortic stenosis. She was admitted in March with an episode of near syncope. On subsequent followup event monitor she was noted to have episodes of paroxysmal atrial fibrillation. She is now on Coumadin. She denies any recurrent syncope or near syncope. She's had no palpitations. She denies any chest pain or shortness of breath. She is still able to do all her own housework and remains fairly active.  Current Outpatient Prescriptions on File Prior to Visit  Medication Sig Dispense Refill  . acetaminophen (TYLENOL) 325 MG tablet Take 650 mg by mouth every 6 (six) hours as needed for pain.      [name] atorvastatin (LIPITOR) 20 MG tablet Take 20 mg by mouth daily.      [name] Besifloxacin HCl (BESIVANCE) 0.6 % SUSP Place 1 drop into the right eye 4 (four) times daily. For 2 days after eye injection      . calcium citrate (CALCITRATE - DOSED IN MG ELEMENTAL CALCIUM) 950 MG tablet Take 1 tablet by mouth daily.      [name] donepezil (ARICEPT) 5 MG tablet Take 5 mg by mouth at bedtime.       . Multiple Vitamin (MULTIVITAMIN) capsule Take 1 capsule by mouth daily.      [name] warfarin (COUMADIN) 2.5 MG tablet Or as directed by the coumadin clinic  30 tablet  3   No current facility-administered medications on file prior to visit.    No Known Allergies  Past Medical History  Diagnosis Date  . Hypertension   . High cholesterol   . Macular degeneration   . UTI (lower urinary tract infection)   . Aortic stenosis   . Syncope 08/21/2012  . Paroxysmal atrial fibrillation     History reviewed. No pertinent past surgical history.  History  Smoking status  . Never Smoker   Smokeless tobacco  . Not on file    History  Alcohol Use No    History reviewed. No pertinent family history.  Review of Systems: The review of systems is positive  for chronic memory loss. She is on Aricept.  All other systems were reviewed and are negative.  Physical Exam: BP 144/72  Pulse 82  Ht 5\' 3"  (1.6 m)  Wt 121 lb 6.4 oz (55.067 kg)  BMI 21.51 kg/m2 She is a pleasant elderly white female in no acute distress. HEENT: Normocephalic, atraumatic. Pupils are equal round and reactive to light accommodation. Extraocular movements are full. Oropharynx is clear. There is good dental care. Neck is supple without JVD, adenopathy, or thyromegaly. Carotid upstrokes are fairly good. Lungs: Clear Cardiovascular: Regular rate and rhythm. Normal S1 with diminished S2. There is a harsh grade 2/6 systolic murmur at the right upper sternal border. Abdomen: Soft and nontender. No masses or bruits. Bowel sounds positive. Extremities: Normal radial, femoral, and pedal pulses. No cyanosis or edema. Skin: Dry Neuro: Alert and oriented x3. Cranial nerves II through XII are intact.  LABORATORY DATA: ECG in the hospital showed normal sinus rhythm with a left bundle branch block. Lab Results  Component Value Date   WBC 8.2 09/30/2012   HGB 13.8 09/30/2012   HCT 41.2 09/30/2012   PLT 218.0 09/30/2012   GLUCOSE 92 09/30/2012   ALT 23 09/30/2012   AST 27 09/30/2012   NA 141 09/30/2012   K 3.9 09/30/2012  CL 103 09/30/2012   CREATININE 1.0 09/30/2012   BUN 17 09/30/2012   CO2 29 09/30/2012   TSH 1.117 07/09/2012   INR 2.1 10/14/2012     Assessment / Plan: 1. Near syncope. I think this is multifactorial with urinary tract infection, aortic stenosis, and possible dehydration. Event monitor did show paroxysmal atrial fibrillation without significant pauses or sustained arrhythmia. 2. Aortic stenosis: Moderate to severe. She has no exertional symptoms. We will continue to monitor for now and our recommend a repeat echocardiogram in 6 months. With her age and memory loss she has not an ideal surgical candidate. 3. Paroxysmal atrial fibrillation. Now on Coumadin. No significant sustained high  rates. 4. Hyperlipidemia. 5. Hypertension-controlled

## 2012-10-21 ENCOUNTER — Ambulatory Visit (INDEPENDENT_AMBULATORY_CARE_PROVIDER_SITE_OTHER): Payer: Medicare Other | Admitting: *Deleted

## 2012-10-21 DIAGNOSIS — I4891 Unspecified atrial fibrillation: Secondary | ICD-10-CM

## 2012-10-21 DIAGNOSIS — Z7901 Long term (current) use of anticoagulants: Secondary | ICD-10-CM | POA: Diagnosis not present

## 2012-11-05 ENCOUNTER — Encounter (INDEPENDENT_AMBULATORY_CARE_PROVIDER_SITE_OTHER): Payer: Medicare Other | Admitting: Ophthalmology

## 2012-11-05 DIAGNOSIS — H3530 Unspecified macular degeneration: Secondary | ICD-10-CM | POA: Diagnosis not present

## 2012-11-05 DIAGNOSIS — H353290 Exudative age-related macular degeneration, unspecified eye, stage unspecified: Secondary | ICD-10-CM | POA: Diagnosis not present

## 2012-11-05 DIAGNOSIS — H43819 Vitreous degeneration, unspecified eye: Secondary | ICD-10-CM

## 2012-11-05 DIAGNOSIS — I10 Essential (primary) hypertension: Secondary | ICD-10-CM

## 2012-11-05 DIAGNOSIS — H35039 Hypertensive retinopathy, unspecified eye: Secondary | ICD-10-CM | POA: Diagnosis not present

## 2012-11-05 DIAGNOSIS — H2510 Age-related nuclear cataract, unspecified eye: Secondary | ICD-10-CM

## 2012-11-06 ENCOUNTER — Encounter: Payer: Self-pay | Admitting: Diagnostic Neuroimaging

## 2012-11-06 ENCOUNTER — Ambulatory Visit (INDEPENDENT_AMBULATORY_CARE_PROVIDER_SITE_OTHER): Payer: Medicare Other | Admitting: Diagnostic Neuroimaging

## 2012-11-06 VITALS — BP 152/74 | HR 73 | Temp 97.7°F | Ht 67.0 in | Wt 123.0 lb

## 2012-11-06 DIAGNOSIS — R55 Syncope and collapse: Secondary | ICD-10-CM

## 2012-11-06 DIAGNOSIS — F0390 Unspecified dementia without behavioral disturbance: Secondary | ICD-10-CM | POA: Diagnosis not present

## 2012-11-06 DIAGNOSIS — I359 Nonrheumatic aortic valve disorder, unspecified: Secondary | ICD-10-CM | POA: Diagnosis not present

## 2012-11-06 DIAGNOSIS — I350 Nonrheumatic aortic (valve) stenosis: Secondary | ICD-10-CM
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
## 2012-11-06 NOTE — Patient Instructions (Signed)
Continue donepezil.  No driving (due to dementia, aortic stenosis and syncope).

## 2012-11-10 ENCOUNTER — Ambulatory Visit (INDEPENDENT_AMBULATORY_CARE_PROVIDER_SITE_OTHER): Payer: Medicare Other | Admitting: *Deleted

## 2012-11-10 DIAGNOSIS — I4891 Unspecified atrial fibrillation: Secondary | ICD-10-CM | POA: Diagnosis not present

## 2012-11-10 DIAGNOSIS — Z7901 Long term (current) use of anticoagulants: Secondary | ICD-10-CM | POA: Diagnosis not present

## 2012-11-20 ENCOUNTER — Ambulatory Visit (INDEPENDENT_AMBULATORY_CARE_PROVIDER_SITE_OTHER): Payer: Medicare Other | Admitting: Pharmacist

## 2012-11-20 DIAGNOSIS — Z7901 Long term (current) use of anticoagulants: Secondary | ICD-10-CM

## 2012-11-20 DIAGNOSIS — I4891 Unspecified atrial fibrillation: Secondary | ICD-10-CM

## 2012-12-04 ENCOUNTER — Ambulatory Visit (INDEPENDENT_AMBULATORY_CARE_PROVIDER_SITE_OTHER): Payer: Medicare Other | Admitting: *Deleted

## 2012-12-04 DIAGNOSIS — I4891 Unspecified atrial fibrillation: Secondary | ICD-10-CM

## 2012-12-04 DIAGNOSIS — Z7901 Long term (current) use of anticoagulants: Secondary | ICD-10-CM

## 2012-12-10 ENCOUNTER — Encounter (INDEPENDENT_AMBULATORY_CARE_PROVIDER_SITE_OTHER): Payer: Medicare Other | Admitting: Ophthalmology

## 2012-12-10 DIAGNOSIS — H35039 Hypertensive retinopathy, unspecified eye: Secondary | ICD-10-CM | POA: Diagnosis not present

## 2012-12-10 DIAGNOSIS — H3530 Unspecified macular degeneration: Secondary | ICD-10-CM

## 2012-12-10 DIAGNOSIS — H353290 Exudative age-related macular degeneration, unspecified eye, stage unspecified: Secondary | ICD-10-CM | POA: Diagnosis not present

## 2012-12-10 DIAGNOSIS — I10 Essential (primary) hypertension: Secondary | ICD-10-CM

## 2012-12-10 DIAGNOSIS — H43819 Vitreous degeneration, unspecified eye: Secondary | ICD-10-CM

## 2012-12-23 DIAGNOSIS — Z23 Encounter for immunization: Secondary | ICD-10-CM | POA: Diagnosis not present

## 2013-01-06 ENCOUNTER — Ambulatory Visit (INDEPENDENT_AMBULATORY_CARE_PROVIDER_SITE_OTHER): Payer: Medicare Other | Admitting: *Deleted

## 2013-01-06 DIAGNOSIS — I4891 Unspecified atrial fibrillation: Secondary | ICD-10-CM | POA: Diagnosis not present

## 2013-01-06 DIAGNOSIS — Z7901 Long term (current) use of anticoagulants: Secondary | ICD-10-CM

## 2013-01-06 LAB — POCT INR: INR: 2.3

## 2013-01-12 ENCOUNTER — Encounter: Payer: Self-pay | Admitting: *Deleted

## 2013-01-12 ENCOUNTER — Other Ambulatory Visit: Payer: Self-pay | Admitting: *Deleted

## 2013-01-12 DIAGNOSIS — I480 Paroxysmal atrial fibrillation: Secondary | ICD-10-CM

## 2013-01-12 MED ORDER — WARFARIN SODIUM 2.5 MG PO TABS: ORAL_TABLET | ORAL | Status: DC

## 2013-01-12 NOTE — Telephone Encounter (Signed)
This encounter was created in error - please disregard.

## 2013-01-14 ENCOUNTER — Encounter (INDEPENDENT_AMBULATORY_CARE_PROVIDER_SITE_OTHER): Payer: Medicare Other | Admitting: Ophthalmology

## 2013-01-14 DIAGNOSIS — H3530 Unspecified macular degeneration: Secondary | ICD-10-CM | POA: Diagnosis not present

## 2013-01-14 DIAGNOSIS — I10 Essential (primary) hypertension: Secondary | ICD-10-CM

## 2013-01-14 DIAGNOSIS — H353290 Exudative age-related macular degeneration, unspecified eye, stage unspecified: Secondary | ICD-10-CM | POA: Diagnosis not present

## 2013-01-14 DIAGNOSIS — H35039 Hypertensive retinopathy, unspecified eye: Secondary | ICD-10-CM

## 2013-01-14 DIAGNOSIS — H43819 Vitreous degeneration, unspecified eye: Secondary | ICD-10-CM

## 2013-02-05 ENCOUNTER — Telehealth: Payer: Self-pay | Admitting: *Deleted

## 2013-02-05 NOTE — Telephone Encounter (Signed)
Daughter, Corrie Dandy calling about pt and her driving.  Checking to see if Dr. [name] got letter.  I told her I would check, but also would be glad to contact pt and ask her if she is following Dr. Richrd Humbles order/ recc about driving.  I mentioned DMV, Driver safety eval/ also family members having to say no and taking her keys away.  Call [facility] on her cell # [phone]

## 2013-02-10 ENCOUNTER — Ambulatory Visit (INDEPENDENT_AMBULATORY_CARE_PROVIDER_SITE_OTHER): Payer: Medicare Other | Admitting: General Practice

## 2013-02-10 DIAGNOSIS — I4891 Unspecified atrial fibrillation: Secondary | ICD-10-CM

## 2013-02-10 DIAGNOSIS — Z7901 Long term (current) use of anticoagulants: Secondary | ICD-10-CM | POA: Diagnosis not present

## 2013-02-10 LAB — POCT INR: INR: 2.3

## 2013-02-16 ENCOUNTER — Telehealth: Payer: Self-pay | Admitting: Diagnostic Neuroimaging

## 2013-02-16 NOTE — Telephone Encounter (Signed)
I called pt and asked her how she was.  Wanted to make sure that she was following doctors order about not driving.  She stated that she was. She knew she needed family members to take her to her appts.  I spoke to daughter, Corrie Dandy and relayed that I did not get resistance relating to her not driving.

## 2013-02-18 ENCOUNTER — Encounter (INDEPENDENT_AMBULATORY_CARE_PROVIDER_SITE_OTHER): Payer: Medicare Other | Admitting: Ophthalmology

## 2013-02-18 DIAGNOSIS — H43819 Vitreous degeneration, unspecified eye: Secondary | ICD-10-CM

## 2013-02-18 DIAGNOSIS — H3530 Unspecified macular degeneration: Secondary | ICD-10-CM

## 2013-02-18 DIAGNOSIS — H35039 Hypertensive retinopathy, unspecified eye: Secondary | ICD-10-CM | POA: Diagnosis not present

## 2013-02-18 DIAGNOSIS — I10 Essential (primary) hypertension: Secondary | ICD-10-CM | POA: Diagnosis not present

## 2013-02-18 DIAGNOSIS — H353290 Exudative age-related macular degeneration, unspecified eye, stage unspecified: Secondary | ICD-10-CM

## 2013-03-20 ENCOUNTER — Encounter (INDEPENDENT_AMBULATORY_CARE_PROVIDER_SITE_OTHER): Payer: Medicare Other | Admitting: Ophthalmology

## 2013-03-20 DIAGNOSIS — H353290 Exudative age-related macular degeneration, unspecified eye, stage unspecified: Secondary | ICD-10-CM | POA: Diagnosis not present

## 2013-03-20 DIAGNOSIS — I10 Essential (primary) hypertension: Secondary | ICD-10-CM

## 2013-03-20 DIAGNOSIS — H43819 Vitreous degeneration, unspecified eye: Secondary | ICD-10-CM

## 2013-03-20 DIAGNOSIS — H35039 Hypertensive retinopathy, unspecified eye: Secondary | ICD-10-CM | POA: Diagnosis not present

## 2013-03-20 DIAGNOSIS — H3530 Unspecified macular degeneration: Secondary | ICD-10-CM

## 2013-03-20 DIAGNOSIS — H2510 Age-related nuclear cataract, unspecified eye: Secondary | ICD-10-CM

## 2013-03-24 ENCOUNTER — Encounter (INDEPENDENT_AMBULATORY_CARE_PROVIDER_SITE_OTHER): Payer: Self-pay

## 2013-03-24 ENCOUNTER — Ambulatory Visit (INDEPENDENT_AMBULATORY_CARE_PROVIDER_SITE_OTHER): Payer: Medicare Other | Admitting: General Practice

## 2013-03-24 DIAGNOSIS — I4891 Unspecified atrial fibrillation: Secondary | ICD-10-CM

## 2013-03-24 DIAGNOSIS — Z7901 Long term (current) use of anticoagulants: Secondary | ICD-10-CM | POA: Diagnosis not present

## 2013-04-08 ENCOUNTER — Ambulatory Visit: Payer: Medicare Other | Admitting: Physician Assistant

## 2013-04-16 ENCOUNTER — Ambulatory Visit: Payer: Medicare Other | Admitting: Cardiology

## 2013-04-16 ENCOUNTER — Ambulatory Visit (INDEPENDENT_AMBULATORY_CARE_PROVIDER_SITE_OTHER): Payer: Medicare Other | Admitting: Cardiology

## 2013-04-16 ENCOUNTER — Encounter: Payer: Self-pay | Admitting: Cardiology

## 2013-04-16 VITALS — BP 160/69 | HR 83 | Ht 67.0 in | Wt 125.6 lb

## 2013-04-16 DIAGNOSIS — I359 Nonrheumatic aortic valve disorder, unspecified: Secondary | ICD-10-CM

## 2013-04-16 DIAGNOSIS — I350 Nonrheumatic aortic (valve) stenosis: Secondary | ICD-10-CM

## 2013-04-16 DIAGNOSIS — I4891 Unspecified atrial fibrillation: Secondary | ICD-10-CM

## 2013-04-16 DIAGNOSIS — Z7901 Long term (current) use of anticoagulants: Secondary | ICD-10-CM | POA: Diagnosis not present

## 2013-04-16 DIAGNOSIS — I10 Essential (primary) hypertension: Secondary | ICD-10-CM

## 2013-04-16 NOTE — Patient Instructions (Signed)
We will schedule you for an Echocardiogram  I will see you in 6 months.  Continue your current therapy

## 2013-04-16 NOTE — Progress Notes (Signed)
Rebecca [name] Date of Birth: Dec 29, 1925 Medical Record #[id number]  History of Present Illness: Rebecca [name] is seen for followup today. She has a history of moderate to severe aortic stenosis. She has paroxysmal atrial fibrillation. She is now on Coumadin. She denies any syncope or near syncope. She's had no palpitations. She denies any chest pain or shortness of breath. She is still able to do all her own housework and remains fairly active.  Current Outpatient Prescriptions on File Prior to Visit  Medication Sig Dispense Refill  . atorvastatin (LIPITOR) 20 MG tablet Take 20 mg by mouth daily.      [name] Besifloxacin HCl (BESIVANCE) 0.6 % SUSP Place 1 drop into the right eye 4 (four) times daily. For 2 days after eye injection      . calcium citrate (CALCITRATE - DOSED IN MG ELEMENTAL CALCIUM) 950 MG tablet Take 1 tablet by mouth daily.      [name] donepezil (ARICEPT) 5 MG tablet Take 5 mg by mouth at bedtime.       . Multiple Vitamin (MULTIVITAMIN) capsule Take 1 capsule by mouth daily.      [name] NIFEdipine (PROCARDIA XL/ADALAT-CC) 90 MG 24 hr tablet Take 1 tablet by mouth daily.      [name] warfarin (COUMADIN) 2.5 MG tablet Or as directed by the coumadin clinic  40 tablet  3   No current facility-administered medications on file prior to visit.    No Known Allergies  Past Medical History  Diagnosis Date  . Hypertension   . High cholesterol   . Macular degeneration   . UTI (lower urinary tract infection)   . Aortic stenosis   . Syncope 08/21/2012  . Paroxysmal atrial fibrillation     History reviewed. No pertinent past surgical history.  History  Smoking status  . Former Smoker -- 0.40 packs/day for 30 years  . Types: Cigarettes  . Quit date: 04/30/1960  Smokeless tobacco  . Former User    History  Alcohol Use No    Family History  Problem Relation Age of Onset  . Heart attack Mother   . Heart Problems Father   . Cerebral aneurysm Sister     Review of Systems: The review of  systems is positive for chronic memory loss. She is on Aricept.  All other systems were reviewed and are negative.  Physical Exam: BP 160/69  Pulse 83  Ht 5\' 7"  (1.702 m)  Wt 125 lb 9.6 oz (56.972 kg)  BMI 19.67 kg/m2 She is a pleasant elderly white female in no acute distress. HEENT: Normocephalic, atraumatic. Pupils are equal round and reactive to light accommodation. Extraocular movements are full. Oropharynx is clear. There is good dental care. Neck is supple without JVD, adenopathy, or thyromegaly. Carotid upstrokes are fairly good. Lungs: Clear Cardiovascular: Regular rate and rhythm. Normal S1 with diminished S2. There is a harsh grade 2/6 systolic murmur at the right upper sternal border. Abdomen: Soft and nontender. No masses or bruits. Bowel sounds positive. Extremities: Normal radial, femoral, and pedal pulses. No cyanosis or edema. Skin: Dry Neuro: Alert and oriented x3. Cranial nerves II through XII are intact.  LABORATORY DATA:  Lab Results  Component Value Date   WBC 8.2 09/30/2012   HGB 13.8 09/30/2012   HCT 41.2 09/30/2012   PLT 218.0 09/30/2012   GLUCOSE 92 09/30/2012   ALT 23 09/30/2012   AST 27 09/30/2012   NA 141 09/30/2012   K 3.9 09/30/2012   CL 103 09/30/2012  CREATININE 1.0 09/30/2012   BUN 17 09/30/2012   CO2 29 09/30/2012   TSH 1.117 07/09/2012   INR 2.8 03/24/2013     Assessment / Plan: 1.  Aortic stenosis: Moderate to severe. She has no exertional symptoms. We will continue to monitor for now and our recommend a repeat echocardiogram now.  With her age and memory loss she has not an ideal surgical candidate. 2. Paroxysmal atrial fibrillation. Now on Coumadin. No significant sustained high rates. 3. Hyperlipidemia. 4. Hypertension-controlled

## 2013-04-17 ENCOUNTER — Ambulatory Visit: Payer: Medicare Other | Admitting: Nurse Practitioner

## 2013-04-28 ENCOUNTER — Encounter (INDEPENDENT_AMBULATORY_CARE_PROVIDER_SITE_OTHER): Payer: Medicare Other | Admitting: Ophthalmology

## 2013-04-28 DIAGNOSIS — H35039 Hypertensive retinopathy, unspecified eye: Secondary | ICD-10-CM

## 2013-04-28 DIAGNOSIS — H3530 Unspecified macular degeneration: Secondary | ICD-10-CM | POA: Diagnosis not present

## 2013-04-28 DIAGNOSIS — H353290 Exudative age-related macular degeneration, unspecified eye, stage unspecified: Secondary | ICD-10-CM | POA: Diagnosis not present

## 2013-04-28 DIAGNOSIS — I10 Essential (primary) hypertension: Secondary | ICD-10-CM | POA: Diagnosis not present

## 2013-04-28 DIAGNOSIS — H2510 Age-related nuclear cataract, unspecified eye: Secondary | ICD-10-CM

## 2013-04-28 DIAGNOSIS — H43819 Vitreous degeneration, unspecified eye: Secondary | ICD-10-CM

## 2013-04-30 HISTORY — PX: CARDIAC CATHETERIZATION: SHX172

## 2013-05-06 ENCOUNTER — Ambulatory Visit (HOSPITAL_COMMUNITY): Payer: Medicare Other | Attending: Cardiology | Admitting: Radiology

## 2013-05-06 ENCOUNTER — Encounter: Payer: Self-pay | Admitting: Cardiovascular Disease

## 2013-05-06 ENCOUNTER — Ambulatory Visit (INDEPENDENT_AMBULATORY_CARE_PROVIDER_SITE_OTHER): Payer: Medicare Other | Admitting: *Deleted

## 2013-05-06 DIAGNOSIS — I10 Essential (primary) hypertension: Secondary | ICD-10-CM

## 2013-05-06 DIAGNOSIS — Z7901 Long term (current) use of anticoagulants: Secondary | ICD-10-CM

## 2013-05-06 DIAGNOSIS — I359 Nonrheumatic aortic valve disorder, unspecified: Secondary | ICD-10-CM | POA: Diagnosis not present

## 2013-05-06 DIAGNOSIS — I4891 Unspecified atrial fibrillation: Secondary | ICD-10-CM

## 2013-05-06 DIAGNOSIS — E785 Hyperlipidemia, unspecified: Secondary | ICD-10-CM | POA: Insufficient documentation

## 2013-05-06 DIAGNOSIS — I350 Nonrheumatic aortic (valve) stenosis: Secondary | ICD-10-CM

## 2013-05-06 DIAGNOSIS — I079 Rheumatic tricuspid valve disease, unspecified: Secondary | ICD-10-CM | POA: Diagnosis not present

## 2013-05-06 DIAGNOSIS — R55 Syncope and collapse: Secondary | ICD-10-CM | POA: Insufficient documentation

## 2013-05-06 DIAGNOSIS — Z87891 Personal history of nicotine dependence: Secondary | ICD-10-CM | POA: Insufficient documentation

## 2013-05-06 DIAGNOSIS — I080 Rheumatic disorders of both mitral and aortic valves: Secondary | ICD-10-CM | POA: Insufficient documentation

## 2013-05-06 LAB — POCT INR: INR: 2

## 2013-05-06 NOTE — Progress Notes (Signed)
Echocardiogram performed.  

## 2013-05-08 ENCOUNTER — Telehealth: Payer: Self-pay | Admitting: Cardiology

## 2013-05-08 NOTE — Telephone Encounter (Signed)
Returned call to patient's daughter Rebecca [name],patient's echo results given.Stated she will be coming with her to appointment with Dr.Jordan 05/11/13.

## 2013-05-08 NOTE — Telephone Encounter (Signed)
New message  Patients daughter would like the results of ultrasound, please call and advise.

## 2013-05-11 ENCOUNTER — Ambulatory Visit (INDEPENDENT_AMBULATORY_CARE_PROVIDER_SITE_OTHER): Payer: Medicare Other | Admitting: Cardiology

## 2013-05-11 ENCOUNTER — Encounter: Payer: Self-pay | Admitting: Cardiology

## 2013-05-11 VITALS — BP 170/90 | HR 80 | Ht 67.0 in | Wt 125.4 lb

## 2013-05-11 DIAGNOSIS — I359 Nonrheumatic aortic valve disorder, unspecified: Secondary | ICD-10-CM | POA: Diagnosis not present

## 2013-05-11 DIAGNOSIS — I10 Essential (primary) hypertension: Secondary | ICD-10-CM

## 2013-05-11 DIAGNOSIS — E785 Hyperlipidemia, unspecified: Secondary | ICD-10-CM

## 2013-05-11 DIAGNOSIS — I480 Paroxysmal atrial fibrillation: Secondary | ICD-10-CM

## 2013-05-11 DIAGNOSIS — I350 Nonrheumatic aortic (valve) stenosis: Secondary | ICD-10-CM

## 2013-05-11 DIAGNOSIS — I4891 Unspecified atrial fibrillation: Secondary | ICD-10-CM

## 2013-05-11 NOTE — Progress Notes (Signed)
Rebecca RoyaltyJennie [name] Date of Birth: 1925/08/28 Medical Record #[id number]  History of Present Illness: Rebecca [name] is seen for followup today of aortic stenosis.  She has paroxysmal atrial fibrillation. She is now on Coumadin. She denies any syncope or near syncope. She's had no palpitations. She denies any chest pain or shortness of breath. She is still able to do all her own housework and take out the garbage. She is able to go up stairs without difficulty. Echocardiogram was repeated this past week and as noted below.  Current Outpatient Prescriptions on File Prior to Visit  Medication Sig Dispense Refill  . atorvastatin (LIPITOR) 20 MG tablet Take 20 mg by mouth daily.      [name]. Besifloxacin HCl (BESIVANCE) 0.6 % SUSP Place 1 drop into the right eye 4 (four) times daily. For 2 days after eye injection      . calcium citrate (CALCITRATE - DOSED IN MG ELEMENTAL CALCIUM) 950 MG tablet Take 1 tablet by mouth daily.      [name]. donepezil (ARICEPT) 5 MG tablet Take 5 mg by mouth at bedtime.       . Multiple Vitamin (MULTIVITAMIN) capsule Take 1 capsule by mouth daily.      [name]. NIFEdipine (PROCARDIA XL/ADALAT-CC) 90 MG 24 hr tablet Take 1 tablet by mouth daily.      [name]. warfarin (COUMADIN) 2.5 MG tablet Or as directed by the coumadin clinic  40 tablet  3   No current facility-administered medications on file prior to visit.    No Known Allergies  Past Medical History  Diagnosis Date  . Hypertension   . High cholesterol   . Macular degeneration   . UTI (lower urinary tract infection)   . Aortic stenosis   . Syncope 08/21/2012  . Paroxysmal atrial fibrillation     History reviewed. No pertinent past surgical history.  History  Smoking status  . Former Smoker -- 0.40 packs/day for 30 years  . Types: Cigarettes  . Quit date: 04/30/1960  Smokeless tobacco  . Former User    History  Alcohol Use No    Family History  Problem Relation Age of Onset  . Heart attack Mother   . Heart Problems Father    . Cerebral aneurysm Sister     Review of Systems: The review of systems is positive for chronic memory loss. She is on Aricept.  All other systems were reviewed and are negative.  Physical Exam: BP 170/90  Pulse 80  Ht 5\' 7"  (1.702 m)  Wt 125 lb 6.4 oz (56.881 kg)  BMI 19.64 kg/m2 She is a pleasant elderly white female in no acute distress. HEENT: Normocephalic, atraumatic. Pupils are equal round and reactive to light accommodation. Extraocular movements are full. Oropharynx is clear. There is good dental care. Neck is supple without JVD, adenopathy, or thyromegaly. Carotid upstrokes are reduced. Lungs: Clear Cardiovascular: Regular rate and rhythm. Normal S1 with diminished S2. There is a harsh grade 2-3/6 systolic murmur at the right upper sternal border. Abdomen: Soft and nontender. No masses or bruits. Bowel sounds positive. Extremities: Normal radial, femoral, and pedal pulses. No cyanosis or edema. Skin: Dry Neuro: Alert and oriented x3. Cranial nerves II through XII are intact.  LABORATORY DATA:  Lab Results  Component Value Date   WBC 8.2 09/30/2012   HGB 13.8 09/30/2012   HCT 41.2 09/30/2012   PLT 218.0 09/30/2012   GLUCOSE 92 09/30/2012   ALT 23 09/30/2012   AST 27 09/30/2012   NA  141 09/30/2012   K 3.9 09/30/2012   CL 103 09/30/2012   CREATININE 1.0 09/30/2012   BUN 17 09/30/2012   CO2 29 09/30/2012   TSH 1.117 07/09/2012   INR 2.0 05/06/2013   Echo:Transthoracic Echocardiography  Patient: Rebecca [name], Rebecca [name] MR #: [id number] Study Date: 05/06/2013 Gender: F Age: 80 Height: 160cm Weight: 54.4kg BSA: 1.40m^2 Pt. Status: Room:  ATTENDING Rebecca [name], Rebecca [name] ORDERING Rebecca [name], Rebecca [name] REFERRING Rebecca [name], Rebecca [name] SONOGRAPHER Aida Raider, RDCS PERFORMING Ch[facility], Outpatient cc:  ------------------------------------------------------------ LV EF: 40% - 45%  ------------------------------------------------------------ Indications: 424.1 Aortic valve  disorders.  ------------------------------------------------------------ History: PMH: Acquired from the patient and from the patient's chart. PMH: Syncope. Paroxysmal Atrial Fibrillation. Risk factors: Former tobacco use. Hypertension. Dyslipidemia.  ------------------------------------------------------------ Study Conclusions  - Left ventricle: The cavity size was normal. Wall thickness was increased in a pattern of moderate LVH. Systolic function was mildly to moderately reduced. The estimated ejection fraction was in the range of 40% to 45%. Diffuse hypokinesis. There was an increased relative contribution of atrial contraction to ventricular filling. Doppler parameters are consistent with abnormal left ventricular relaxation (grade 1 diastolic dysfunction). - Aortic valve: There was severe stenosis. Mild regurgitation. - Left atrium: The atrium was mildly dilated. Transthoracic echocardiography. M-mode, complete 2D, spectral Doppler, and color Doppler. Height: Height: 160cm. Height: 63in. Weight: Weight: 54.4kg. Weight: 119.8lb. Body mass index: BMI: 21.3kg/m^2. Body surface area: BSA: 1.46m^2. Blood pressure: 160/69. Patient status: Outpatient. Location: [facility]  ------------------------------------------------------------  ------------------------------------------------------------ Left ventricle: The cavity size was normal. Wall thickness was increased in a pattern of moderate LVH. Systolic function was mildly to moderately reduced. The estimated ejection fraction was in the range of 40% to 45%. Diffuse hypokinesis. There was an increased relative contribution of atrial contraction to ventricular filling. Early diastolic septal annular tissue Doppler velocities Ea were abnormal. Doppler parameters are consistent with abnormal left ventricular relaxation (grade 1 diastolic dysfunction).  ------------------------------------------------------------ Aortic  valve: Moderately thickened, moderately calcified leaflets. Doppler: There was severe stenosis. Mild regurgitation. VTI ratio of LVOT to aortic valve: 0.26. Valve area: 0.74cm^2(VTI). Indexed valve area: 0.47cm^2/m^2 (VTI). Peak velocity ratio of LVOT to aortic valve: 0.25. Valve area: 0.71cm^2 (Vmax). Indexed valve area: 0.46cm^2/m^2 (Vmax). Mean gradient: 47mm Hg (S). Peak gradient: 65mm Hg (S).  ------------------------------------------------------------ Aorta: Aortic root: The aortic root was normal in size. Ascending aorta: The ascending aorta was normal in size.  ------------------------------------------------------------ Mitral valve: Moderately thickened, moderately calcified leaflets . Doppler: Trivial regurgitation. Peak gradient: 3mm Hg (D).  ------------------------------------------------------------ Left atrium: The atrium was mildly dilated.  ------------------------------------------------------------ Right ventricle: The cavity size was normal. Systolic function was normal.  ------------------------------------------------------------ Pulmonic valve: Structurally normal valve. Cusp separation was normal. Doppler: Transvalvular velocity was within the normal range. No regurgitation.  ------------------------------------------------------------ Tricuspid valve: Structurally normal valve. Leaflet separation was normal. Doppler: Transvalvular velocity was within the normal range. Trivial regurgitation.  ------------------------------------------------------------ Pulmonary artery: Systolic pressure was within the normal range.  ------------------------------------------------------------ Right atrium: The atrium was normal in size.  ------------------------------------------------------------ Pericardium: There was no pericardial effusion.  ------------------------------------------------------------ Systemic veins: Inferior vena cava: The vessel was  normal in size; the respirophasic diameter changes were in the normal range (= 50%); findings are consistent with normal central venous pressure.  ------------------------------------------------------------  2D measurements Normal Doppler measurements Normal Left ventricle Main pulmonary LVID ED, 36.6 mm 43-52 artery chord, Pressure, 24 mm Hg =30 PLAX S LVID ES, 23.6 mm 23-38 Left ventricle chord, Ea, lat 5.48 cm/s ------ PLAX ann, tiss FS, chord, 36 % >29  DP PLAX E/Ea, lat 16.2 ------ LVPW, ED 13.2 mm ------ ann, tiss IVS/LVPW 1.25 <1.3 DP ratio, ED Ea, med 4.06 cm/s ------ Ventricular septum ann, tiss IVS, ED 16.5 mm ------ DP LVOT E/Ea, med 21.8 ------ Diam, S 19 mm ------ ann, tiss 7 Area 2.84 cm^2 ------ DP Diam 19 mm ------ LVOT Aorta Peak vel, 101 cm/s ------ Root diam, 30 mm ------ S ED VTI, S 25.9 cm ------ AAo AP 36 mm ------ Stroke vol 73.4 ml ------ diam, S Stroke 47.1 ml/m^2 ------ Left atrium index AP dim 36 mm ------ Aortic valve AP dim 2.31 cm/m^2 <2.2 Peak vel, 404 cm/s ------ index S Mean vel, 328 cm/s ------ S VTI, S 100 cm ------ Mean 47 mm Hg ------ gradient, S Peak 65 mm Hg ------ gradient, S VTI ratio 0.26 ------ LVOT/AV Area, VTI 0.74 cm^2 ------ Area index 0.47 cm^2/m ------ (VTI) ^2 Peak vel 0.25 ------ ratio, LVOT/AV Area, Vmax 0.71 cm^2 ------ Area index 0.46 cm^2/m ------ (Vmax) ^2 Regurg PHT 482 ms ------ Mitral valve Peak E vel 88.8 cm/s ------ Peak A vel 151 cm/s ------ Decelerati 363 ms 150-23 on time 0 Peak 3 mm Hg ------ gradient, D Peak E/A 0.6 ------ ratio Tricuspid valve Regurg 217 cm/s ------ peak vel Peak RV-RA 19 mm Hg ------ gradient, S Systemic veins Estimated 5 mm Hg ------ CVP Right ventricle Pressure, 24 mm Hg <30 S Sa vel, 14.1 cm/s ------ lat ann, tiss DP  ------------------------------------------------------------ Prepared and Electronically Authenticated by  Kristeen MissNahser,  Philip [id number]:[id number]   Assessment / Plan: 1.  Aortic stenosis: Now severe. There has been significant progression of her aortic stenosis from 6 months ago with increase gradient. More ominously there has been reduction of her ejection fraction to 40-45%. Given these changes I recommended consideration for aortic valve replacement. Initially, we will proceed with right and left heart catheterization with coronary angiography. Depending on these results we may refer her to the multidisciplinary valve clinic for consideration of TAVR. The cardiac catheterization procedure was explained in detail.The procedure and risks were reviewed including but not limited to death, myocardial infarction, stroke, arrythmias, bleeding, transfusion, emergency surgery, dye allergy, or renal dysfunction. The patient voices understanding and is agreeable to proceed.  2. Paroxysmal atrial fibrillation. Now on Coumadin. No significant sustained high rates. 3. Hyperlipidemia. 4. Hypertension-controlled

## 2013-05-12 ENCOUNTER — Other Ambulatory Visit: Payer: Self-pay

## 2013-05-12 DIAGNOSIS — R55 Syncope and collapse: Secondary | ICD-10-CM

## 2013-05-12 DIAGNOSIS — I4891 Unspecified atrial fibrillation: Secondary | ICD-10-CM

## 2013-05-12 DIAGNOSIS — I350 Nonrheumatic aortic (valve) stenosis: Secondary | ICD-10-CM

## 2013-05-12 DIAGNOSIS — I10 Essential (primary) hypertension: Secondary | ICD-10-CM

## 2013-05-15 ENCOUNTER — Other Ambulatory Visit: Payer: Self-pay | Admitting: Cardiology

## 2013-05-19 ENCOUNTER — Other Ambulatory Visit (INDEPENDENT_AMBULATORY_CARE_PROVIDER_SITE_OTHER): Payer: Medicare Other

## 2013-05-19 ENCOUNTER — Encounter (HOSPITAL_COMMUNITY): Payer: Self-pay | Admitting: Pharmacy Technician

## 2013-05-19 DIAGNOSIS — R55 Syncope and collapse: Secondary | ICD-10-CM | POA: Diagnosis not present

## 2013-05-19 DIAGNOSIS — I4891 Unspecified atrial fibrillation: Secondary | ICD-10-CM

## 2013-05-19 DIAGNOSIS — I359 Nonrheumatic aortic valve disorder, unspecified: Secondary | ICD-10-CM | POA: Diagnosis not present

## 2013-05-19 DIAGNOSIS — I10 Essential (primary) hypertension: Secondary | ICD-10-CM | POA: Diagnosis not present

## 2013-05-19 DIAGNOSIS — I350 Nonrheumatic aortic (valve) stenosis: Secondary | ICD-10-CM

## 2013-05-19 LAB — CBC WITH DIFFERENTIAL/PLATELET
BASOS ABS: 0 10*3/uL (ref 0.0–0.1)
BASOS PCT: 0.2 % (ref 0.0–3.0)
Eosinophils Absolute: 0.1 10*3/uL (ref 0.0–0.7)
Eosinophils Relative: 1.8 % (ref 0.0–5.0)
HCT: 42.6 % (ref 36.0–46.0)
HEMOGLOBIN: 14.3 g/dL (ref 12.0–15.0)
LYMPHS PCT: 38.3 % (ref 12.0–46.0)
Lymphs Abs: 3 10*3/uL (ref 0.7–4.0)
MCHC: 33.7 g/dL (ref 30.0–36.0)
MCV: 88.7 fl (ref 78.0–100.0)
Monocytes Absolute: 0.6 10*3/uL (ref 0.1–1.0)
Monocytes Relative: 7.3 % (ref 3.0–12.0)
NEUTROS PCT: 52.4 % (ref 43.0–77.0)
Neutro Abs: 4.1 10*3/uL (ref 1.4–7.7)
Platelets: 237 10*3/uL (ref 150.0–400.0)
RBC: 4.8 Mil/uL (ref 3.87–5.11)
RDW: 14.2 % (ref 11.5–14.6)
WBC: 7.8 10*3/uL (ref 4.5–10.5)

## 2013-05-19 LAB — BASIC METABOLIC PANEL
BUN: 14 mg/dL (ref 6–23)
CALCIUM: 9.3 mg/dL (ref 8.4–10.5)
CO2: 32 meq/L (ref 19–32)
CREATININE: 1 mg/dL (ref 0.4–1.2)
Chloride: 101 mEq/L (ref 96–112)
GFR: 57.31 mL/min — ABNORMAL LOW (ref >60.00)
Glucose, Bld: 94 mg/dL (ref 70–99)
Potassium: 3.7 mEq/L (ref 3.5–5.1)
Sodium: 142 mEq/L (ref 135–145)

## 2013-05-19 LAB — PROTIME-INR
INR: 2.5 ratio — ABNORMAL HIGH (ref 0.8–1.0)
Prothrombin Time: 25.9 s — ABNORMAL HIGH (ref 10.2–12.4)

## 2013-05-25 ENCOUNTER — Encounter (HOSPITAL_COMMUNITY)
Admission: RE | Disposition: A | Discharge status: Home / Self Care | Payer: Self-pay | Source: Ambulatory Visit | Attending: Cardiology

## 2013-05-25 ENCOUNTER — Ambulatory Visit (HOSPITAL_COMMUNITY)
Admission: RE | Admit: 2013-05-25 | Discharge: 2013-05-25 | Disposition: A | Discharge status: Home / Self Care | Payer: Medicare Other | Source: Ambulatory Visit | Attending: Cardiology | Admitting: Cardiology

## 2013-05-25 DIAGNOSIS — I359 Nonrheumatic aortic valve disorder, unspecified: Secondary | ICD-10-CM | POA: Insufficient documentation

## 2013-05-25 DIAGNOSIS — I2510 Atherosclerotic heart disease of native coronary artery without angina pectoris: Secondary | ICD-10-CM

## 2013-05-25 DIAGNOSIS — I4891 Unspecified atrial fibrillation: Secondary | ICD-10-CM | POA: Diagnosis not present

## 2013-05-25 DIAGNOSIS — I10 Essential (primary) hypertension: Secondary | ICD-10-CM | POA: Insufficient documentation

## 2013-05-25 DIAGNOSIS — E7800 Pure hypercholesterolemia, unspecified: Secondary | ICD-10-CM | POA: Diagnosis not present

## 2013-05-25 DIAGNOSIS — H3530 Unspecified macular degeneration: Secondary | ICD-10-CM | POA: Insufficient documentation

## 2013-05-25 DIAGNOSIS — Z7901 Long term (current) use of anticoagulants: Secondary | ICD-10-CM | POA: Insufficient documentation

## 2013-05-25 DIAGNOSIS — Z87891 Personal history of nicotine dependence: Secondary | ICD-10-CM | POA: Diagnosis not present

## 2013-05-25 DIAGNOSIS — I350 Nonrheumatic aortic (valve) stenosis: Secondary | ICD-10-CM

## 2013-05-25 HISTORY — PX: LEFT AND RIGHT HEART CATHETERIZATION WITH CORONARY ANGIOGRAM: SHX5449

## 2013-05-25 LAB — POCT I-STAT 3, VENOUS BLOOD GAS (G3P V)
Acid-Base Excess: 3 mmol/L — ABNORMAL HIGH (ref 0.0–2.0)
Bicarbonate: 28.2 mEq/L — ABNORMAL HIGH (ref 20.0–24.0)
O2 Saturation: 71 %
PO2 VEN: 37 mmHg (ref 30.0–45.0)
TCO2: 30 mmol/L (ref 0–100)
pCO2, Ven: 45.2 mmHg (ref 45.0–50.0)
pH, Ven: 7.403 — ABNORMAL HIGH (ref 7.250–7.300)

## 2013-05-25 LAB — POCT I-STAT 3, ART BLOOD GAS (G3+)
Acid-Base Excess: 3 mmol/L — ABNORMAL HIGH (ref 0.0–2.0)
BICARBONATE: 27.4 meq/L — AB (ref 20.0–24.0)
O2 Saturation: 93 %
TCO2: 29 mmol/L (ref 0–100)
pCO2 arterial: 41.2 mmHg (ref 35.0–45.0)
pH, Arterial: 7.43 (ref 7.350–7.450)
pO2, Arterial: 67 mmHg — ABNORMAL LOW (ref 80.0–100.0)

## 2013-05-25 LAB — PROTIME-INR
INR: 0.98 (ref 0.00–1.49)
PROTHROMBIN TIME: 12.8 s (ref 11.6–15.2)

## 2013-05-25 SURGERY — LEFT AND RIGHT HEART CATHETERIZATION WITH CORONARY ANGIOGRAM
Anesthesia: LOCAL

## 2013-05-25 MED ORDER — SODIUM CHLORIDE 0.9 % IV SOLN: 1.0000 mL/kg/h | INTRAVENOUS | Status: DC

## 2013-05-25 MED ORDER — LIDOCAINE HCL (PF) 1 % IJ SOLN
INTRAMUSCULAR | Status: AC
  Filled 2013-05-25: qty 30

## 2013-05-25 MED ORDER — SODIUM CHLORIDE 0.9 % IJ SOLN: 3.0000 mL | Freq: Two times a day (BID) | INTRAMUSCULAR | Status: DC

## 2013-05-25 MED ORDER — SODIUM CHLORIDE 0.9 % IV SOLN: INTRAVENOUS | Status: DC

## 2013-05-25 MED ORDER — NITROGLYCERIN 0.2 MG/ML ON CALL CATH LAB
INTRAVENOUS | Status: AC
  Filled 2013-05-25: qty 1

## 2013-05-25 MED ORDER — SODIUM CHLORIDE 0.9 % IV SOLN: 250.0000 mL | INTRAVENOUS | Status: DC | PRN

## 2013-05-25 MED ORDER — FENTANYL CITRATE 0.05 MG/ML IJ SOLN
INTRAMUSCULAR | Status: AC
  Filled 2013-05-25: qty 2

## 2013-05-25 MED ORDER — ONDANSETRON HCL 4 MG/2ML IJ SOLN: 4.0000 mg | Freq: Four times a day (QID) | INTRAMUSCULAR | Status: DC | PRN

## 2013-05-25 MED ORDER — HEPARIN (PORCINE) IN NACL 2-0.9 UNIT/ML-% IJ SOLN
INTRAMUSCULAR | Status: AC
  Filled 2013-05-25: qty 1000

## 2013-05-25 MED ORDER — ACETAMINOPHEN 325 MG PO TABS: 650.0000 mg | ORAL_TABLET | ORAL | Status: DC | PRN

## 2013-05-25 MED ORDER — MIDAZOLAM HCL 2 MG/2ML IJ SOLN
INTRAMUSCULAR | Status: AC
Start: 2013-05-25 — End: 2013-05-25
  Filled 2013-05-25: qty 2

## 2013-05-25 MED ORDER — SODIUM CHLORIDE 0.9 % IJ SOLN: 3.0000 mL | INTRAMUSCULAR | Status: DC | PRN

## 2013-05-25 NOTE — Interval H&P Note (Signed)
History and Physical Interval Note:  05/25/2013 1:10 PM  Rebecca [name]  has presented today for surgery, with the diagnosis of Chest pain  The various methods of treatment have been discussed with the patient and family. After consideration of risks, benefits and other options for treatment, the patient has consented to  Procedure(s): LEFT AND RIGHT HEART CATHETERIZATION WITH CORONARY ANGIOGRAM (N/A) as a surgical intervention .  The patient's history has been reviewed, patient examined, no change in status, stable for surgery.  I have reviewed the patient's chart and labs.  Questions were answered to the patient's satisfaction.   Cath Lab Visit (complete for each Cath Lab visit)  Clinical Evaluation Leading to the Procedure:   ACS: no  Non-ACS:    Anginal Classification: No Symptoms  Anti-ischemic medical therapy: Minimal Therapy (1 class of medications)  Non-Invasive Test Results: No non-invasive testing performed  Prior CABG: No previous CABG        Theron AristaPeter [facility]oscopy CenterJordanMD,FACC 05/25/2013 1:10 PM

## 2013-05-25 NOTE — Discharge Instructions (Addendum)
Resume coumadin on 05/26/13 at prior dose. Angiography, Care After Refer to this sheet in the next few weeks. These instructions provide you with information on caring for yourself after your procedure. Your health care provider may also give you more specific instructions. Your treatment has been planned according to current medical practices, but problems sometimes occur. Call your health care provider if you have any problems or questions after your procedure.  WHAT TO EXPECT AFTER THE PROCEDURE After your procedure, it is typical to have the following sensations:  Minor discomfort or tenderness and a small bump at the catheter insertion site. The bump should usually decrease in size and tenderness within 1 to 2 weeks.  Any bruising will usually fade within 2 to 4 weeks. HOME CARE INSTRUCTIONS   You may need to keep taking blood thinners if they were prescribed for you. Only take over-the-counter or prescription medicines for pain, fever, or discomfort as directed by your health care provider.  Do not apply powder or lotion to the site.  Do not sit in a bathtub, swimming pool, or whirlpool for 5 to 7 days.  You may shower 24 hours after the procedure. Remove the bandage (dressing) and gently wash the site with plain soap and water. Gently pat the site dry.  Inspect the site at least twice daily.  Limit your activity for the first 48 hours. Do not bend, squat, or lift anything over 20 lb (9 kg) or as directed by your health care provider.  Do not drive home if you are discharged the day of the procedure. Have someone else drive you. Follow instructions about when you can drive or return to work. SEEK MEDICAL CARE IF:  You get lightheaded when standing up.  You have drainage (other than a small amount of blood on the dressing).  You have chills.  You have a fever.  You have redness, warmth, swelling, or pain at the insertion site. SEEK IMMEDIATE MEDICAL CARE IF:   You develop  chest pain or shortness of breath, feel faint, or pass out.  You have bleeding, swelling larger than a walnut, or drainage from the catheter insertion site.  You develop pain, discoloration, coldness, or severe bruising in the leg or arm that held the catheter.  You develop bleeding from any other place, such as the bowels. You may see bright red blood in your urine or stools, or your stools may appear black and tarry.  You have heavy bleeding from the site. If this happens, hold pressure on the site. MAKE SURE YOU:  Understand these instructions.  Will watch your condition.  Will get help right away if you are not doing well or get worse. Document Released: 11/02/2004 Document Revised: 12/17/2012 Document Reviewed: 09/08/2012 [facility] Patient Information 2014 [employer] [place].

## 2013-05-25 NOTE — CV Procedure (Signed)
   Cardiac Catheterization Procedure Note  Name: Rebecca RoyaltyJennie [name] MRN: [id number] DOB: [birth year]  Procedure: Right Heart Cath, Left Heart Cath, Selective Coronary Angiography, LV angiography  Indication: 80 yo WF with history of severe aortic stenosis. Recent Echo demonstrated decline in LV function.   Procedural Details: The right groin was prepped, draped, and anesthetized with 1% lidocaine. Using the modified Seldinger technique a 5 French sheath was placed in the right femoral artery and a 7 French sheath was placed in the right femoral vein. A Swan-Ganz catheter was used for the right heart catheterization. Standard protocol was followed for recording of right heart pressures and sampling of oxygen saturations. Fick cardiac output was calculated. Standard Judkins catheters were used for selective coronary angiography and left ventriculography. There were no immediate procedural complications. The patient was transferred to the post catheterization recovery area for further monitoring.  Procedural Findings: Hemodynamics RA 7/5 mean 4 mm Hg RV 32/8 mm Hg PA 24/11 mm Hg PCWP 13/12 mean 10 mm Hg LV 154/8 mm Hg AO 115/442 mean 67 mm Hg.   AV mean gradient: 31 mm Hg, peak to peak gradient: 39 mm Hg. AV area: 0.9 cm2 with index 0.6  Oxygen saturations: PA 71% AO 93%  Cardiac Output (Fick) 4.82 L/min  Cardiac Index (Fick) 3.11 L/min/m2   Coronary angiography: Coronary dominance: codominant  Left mainstem: Normal  Left anterior descending (LAD): Mild disease in the proximal vessel to 30%. The first diagonal is normal.  Left circumflex (LCx): Very large codominant vessel with multiple OM branches. Normal.  Right coronary artery (RCA): Normal.  Left ventriculography: Hand injection only. Left ventricular systolic function is normal, LVEF is estimated at 55-65%, there is no significant mitral regurgitation. The aortic valve and mitral annulus are heavily calcified   Final  Conclusions:   1. Mild nonobstructive CAD 2. Normal LV function. 3. Normal right heart pressures 4. Severe Aortic stenosis.  Recommendations: Will review Echo data. LVEF appears much better by cath data.    Theron Aristaeter [facility] LLCJordanMD,FACC 05/25/2013, 2:04 PM

## 2013-05-25 NOTE — H&P (View-Only) (Signed)
Rebecca [name] Date of Birth: 1925/08/28 Medical Record #[id number]  History of Present Illness: Rebecca [name] is seen for followup today of aortic stenosis.  She has paroxysmal atrial fibrillation. She is now on Coumadin. She denies any syncope or near syncope. She's had no palpitations. She denies any chest pain or shortness of breath. She is still able to do all her own housework and take out the garbage. She is able to go up stairs without difficulty. Echocardiogram was repeated this past week and as noted below.  Current Outpatient Prescriptions on File Prior to Visit  Medication Sig Dispense Refill  . atorvastatin (LIPITOR) 20 MG tablet Take 20 mg by mouth daily.      [name]. Besifloxacin HCl (BESIVANCE) 0.6 % SUSP Place 1 drop into the right eye 4 (four) times daily. For 2 days after eye injection      . calcium citrate (CALCITRATE - DOSED IN MG ELEMENTAL CALCIUM) 950 MG tablet Take 1 tablet by mouth daily.      [name]. donepezil (ARICEPT) 5 MG tablet Take 5 mg by mouth at bedtime.       . Multiple Vitamin (MULTIVITAMIN) capsule Take 1 capsule by mouth daily.      [name]. NIFEdipine (PROCARDIA XL/ADALAT-CC) 90 MG 24 hr tablet Take 1 tablet by mouth daily.      [name]. warfarin (COUMADIN) 2.5 MG tablet Or as directed by the coumadin clinic  40 tablet  3   No current facility-administered medications on file prior to visit.    No Known Allergies  Past Medical History  Diagnosis Date  . Hypertension   . High cholesterol   . Macular degeneration   . UTI (lower urinary tract infection)   . Aortic stenosis   . Syncope 08/21/2012  . Paroxysmal atrial fibrillation     History reviewed. No pertinent past surgical history.  History  Smoking status  . Former Smoker -- 0.40 packs/day for 30 years  . Types: Cigarettes  . Quit date: 04/30/1960  Smokeless tobacco  . Former User    History  Alcohol Use No    Family History  Problem Relation Age of Onset  . Heart attack Mother   . Heart Problems Father    . Cerebral aneurysm Sister     Review of Systems: The review of systems is positive for chronic memory loss. She is on Aricept.  All other systems were reviewed and are negative.  Physical Exam: BP 170/90  Pulse 80  Ht 5\' 7"  (1.702 m)  Wt 125 lb 6.4 oz (56.881 kg)  BMI 19.64 kg/m2 She is a pleasant elderly white female in no acute distress. HEENT: Normocephalic, atraumatic. Pupils are equal round and reactive to light accommodation. Extraocular movements are full. Oropharynx is clear. There is good dental care. Neck is supple without JVD, adenopathy, or thyromegaly. Carotid upstrokes are reduced. Lungs: Clear Cardiovascular: Regular rate and rhythm. Normal S1 with diminished S2. There is a harsh grade 2-3/6 systolic murmur at the right upper sternal border. Abdomen: Soft and nontender. No masses or bruits. Bowel sounds positive. Extremities: Normal radial, femoral, and pedal pulses. No cyanosis or edema. Skin: Dry Neuro: Alert and oriented x3. Cranial nerves II through XII are intact.  LABORATORY DATA:  Lab Results  Component Value Date   WBC 8.2 09/30/2012   HGB 13.8 09/30/2012   HCT 41.2 09/30/2012   PLT 218.0 09/30/2012   GLUCOSE 92 09/30/2012   ALT 23 09/30/2012   AST 27 09/30/2012   NA  141 09/30/2012   K 3.9 09/30/2012   CL 103 09/30/2012   CREATININE 1.0 09/30/2012   BUN 17 09/30/2012   CO2 29 09/30/2012   TSH 1.117 07/09/2012   INR 2.0 05/06/2013   Echo:Transthoracic Echocardiography  Patient: Rebecca [name] MR #: [id number] Study Date: 05/06/2013 Gender: F Age: 80 Height: 160cm Weight: 54.4kg BSA: 1.40m^2 Pt. Status: Room:  ATTENDING Rebecca [name] ORDERING Rebecca [name] REFERRING Rebecca [name] SONOGRAPHER Rebecca [name] PERFORMING [facility], Outpatient cc:  ------------------------------------------------------------ LV EF: 40% - 45%  ------------------------------------------------------------ Indications: 424.1 Aortic valve  disorders.  ------------------------------------------------------------ History: PMH: Acquired from the patient and from the patient's chart. PMH: Syncope. Paroxysmal Atrial Fibrillation. Risk factors: Former tobacco use. Hypertension. Dyslipidemia.  ------------------------------------------------------------ Study Conclusions  - Left ventricle: The cavity size was normal. Wall thickness was increased in a pattern of moderate LVH. Systolic function was mildly to moderately reduced. The estimated ejection fraction was in the range of 40% to 45%. Diffuse hypokinesis. There was an increased relative contribution of atrial contraction to ventricular filling. Doppler parameters are consistent with abnormal left ventricular relaxation (grade 1 diastolic dysfunction). - Aortic valve: There was severe stenosis. Mild regurgitation. - Left atrium: The atrium was mildly dilated. Transthoracic echocardiography. M-mode, complete 2D, spectral Doppler, and color Doppler. Height: Height: 160cm. Height: 63in. Weight: Weight: 54.4kg. Weight: 119.8lb. Body mass index: BMI: 21.3kg/m^2. Body surface area: BSA: 1.46m^2. Blood pressure: 160/69. Patient status: Outpatient. Location: [facility]  ------------------------------------------------------------  ------------------------------------------------------------ Left ventricle: The cavity size was normal. Wall thickness was increased in a pattern of moderate LVH. Systolic function was mildly to moderately reduced. The estimated ejection fraction was in the range of 40% to 45%. Diffuse hypokinesis. There was an increased relative contribution of atrial contraction to ventricular filling. Early diastolic septal annular tissue Doppler velocities Ea were abnormal. Doppler parameters are consistent with abnormal left ventricular relaxation (grade 1 diastolic dysfunction).  ------------------------------------------------------------ Aortic  valve: Moderately thickened, moderately calcified leaflets. Doppler: There was severe stenosis. Mild regurgitation. VTI ratio of LVOT to aortic valve: 0.26. Valve area: 0.74cm^2(VTI). Indexed valve area: 0.47cm^2/m^2 (VTI). Peak velocity ratio of LVOT to aortic valve: 0.25. Valve area: 0.71cm^2 (Vmax). Indexed valve area: 0.46cm^2/m^2 (Vmax). Mean gradient: 47mm Hg (S). Peak gradient: 65mm Hg (S).  ------------------------------------------------------------ Aorta: Aortic root: The aortic root was normal in size. Ascending aorta: The ascending aorta was normal in size.  ------------------------------------------------------------ Mitral valve: Moderately thickened, moderately calcified leaflets . Doppler: Trivial regurgitation. Peak gradient: 3mm Hg (D).  ------------------------------------------------------------ Left atrium: The atrium was mildly dilated.  ------------------------------------------------------------ Right ventricle: The cavity size was normal. Systolic function was normal.  ------------------------------------------------------------ Pulmonic valve: Structurally normal valve. Cusp separation was normal. Doppler: Transvalvular velocity was within the normal range. No regurgitation.  ------------------------------------------------------------ Tricuspid valve: Structurally normal valve. Leaflet separation was normal. Doppler: Transvalvular velocity was within the normal range. Trivial regurgitation.  ------------------------------------------------------------ Pulmonary artery: Systolic pressure was within the normal range.  ------------------------------------------------------------ Right atrium: The atrium was normal in size.  ------------------------------------------------------------ Pericardium: There was no pericardial effusion.  ------------------------------------------------------------ Systemic veins: Inferior vena cava: The vessel was  normal in size; the respirophasic diameter changes were in the normal range (= 50%); findings are consistent with normal central venous pressure.  ------------------------------------------------------------  2D measurements Normal Doppler measurements Normal Left ventricle Main pulmonary LVID ED, 36.6 mm 43-52 artery chord, Pressure, 24 mm Hg =30 PLAX S LVID ES, 23.6 mm 23-38 Left ventricle chord, Ea, lat 5.48 cm/s ------ PLAX ann, tiss FS, chord, 36 % >29  DP PLAX E/Ea, lat 16.2 ------ LVPW, ED 13.2 mm ------ ann, tiss IVS/LVPW 1.25 <1.3 DP ratio, ED Ea, med 4.06 cm/s ------ Ventricular septum ann, tiss IVS, ED 16.5 mm ------ DP LVOT E/Ea, med 21.8 ------ Diam, S 19 mm ------ ann, tiss 7 Area 2.84 cm^2 ------ DP Diam 19 mm ------ LVOT Aorta Peak vel, 101 cm/s ------ Root diam, 30 mm ------ S ED VTI, S 25.9 cm ------ AAo AP 36 mm ------ Stroke vol 73.4 ml ------ diam, S Stroke 47.1 ml/m^2 ------ Left atrium index AP dim 36 mm ------ Aortic valve AP dim 2.31 cm/m^2 <2.2 Peak vel, 404 cm/s ------ index S Mean vel, 328 cm/s ------ S VTI, S 100 cm ------ Mean 47 mm Hg ------ gradient, S Peak 65 mm Hg ------ gradient, S VTI ratio 0.26 ------ LVOT/AV Area, VTI 0.74 cm^2 ------ Area index 0.47 cm^2/m ------ (VTI) ^2 Peak vel 0.25 ------ ratio, LVOT/AV Area, Vmax 0.71 cm^2 ------ Area index 0.46 cm^2/m ------ (Vmax) ^2 Regurg PHT 482 ms ------ Mitral valve Peak E vel 88.8 cm/s ------ Peak A vel 151 cm/s ------ Decelerati 363 ms 150-23 on time 0 Peak 3 mm Hg ------ gradient, D Peak E/A 0.6 ------ ratio Tricuspid valve Regurg 217 cm/s ------ peak vel Peak RV-RA 19 mm Hg ------ gradient, S Systemic veins Estimated 5 mm Hg ------ CVP Right ventricle Pressure, 24 mm Hg <30 S Sa vel, 14.1 cm/s ------ lat ann, tiss DP  ------------------------------------------------------------ Prepared and Electronically Authenticated by  Rebecca [name],  Rebecca [name] 2015-01-07T18:[id number]   Assessment / Plan: 1.  Aortic stenosis: Now severe. There has been significant progression of her aortic stenosis from 6 months ago with increase gradient. More ominously there has been reduction of her ejection fraction to 40-45%. Given these changes I recommended consideration for aortic valve replacement. Initially, we will proceed with right and left heart catheterization with coronary angiography. Depending on these results we may refer her to the multidisciplinary valve clinic for consideration of TAVR. The cardiac catheterization procedure was explained in detail.The procedure and risks were reviewed including but not limited to death, myocardial infarction, stroke, arrythmias, bleeding, transfusion, emergency surgery, dye allergy, or renal dysfunction. The patient voices understanding and is agreeable to proceed.  2. Paroxysmal atrial fibrillation. Now on Coumadin. No significant sustained high rates. 3. Hyperlipidemia. 4. Hypertension-controlled

## 2013-05-26 ENCOUNTER — Telehealth: Payer: Self-pay | Admitting: Cardiology

## 2013-05-26 NOTE — Telephone Encounter (Signed)
Returned call to patient she stated she had cardiac cath yesterday 05/26/13 wanted to know when she is to restart coumadin.Advised ok to restart coumadin today at normal dose.Advised to keep appointment with Dr.Jordan 06/08/13 3:30 pm,and INR appointment 06/17/13 3:00 pm.

## 2013-05-26 NOTE — Telephone Encounter (Signed)
New Prob    Pt has some questions regarding going back on Xarelto. Please call.

## 2013-05-28 ENCOUNTER — Other Ambulatory Visit: Payer: Self-pay | Admitting: Diagnostic Neuroimaging

## 2013-06-03 ENCOUNTER — Encounter (INDEPENDENT_AMBULATORY_CARE_PROVIDER_SITE_OTHER): Payer: Medicare Other | Admitting: Ophthalmology

## 2013-06-03 DIAGNOSIS — H3530 Unspecified macular degeneration: Secondary | ICD-10-CM | POA: Diagnosis not present

## 2013-06-03 DIAGNOSIS — I10 Essential (primary) hypertension: Secondary | ICD-10-CM

## 2013-06-03 DIAGNOSIS — H43819 Vitreous degeneration, unspecified eye: Secondary | ICD-10-CM

## 2013-06-03 DIAGNOSIS — H2510 Age-related nuclear cataract, unspecified eye: Secondary | ICD-10-CM

## 2013-06-03 DIAGNOSIS — H35039 Hypertensive retinopathy, unspecified eye: Secondary | ICD-10-CM

## 2013-06-03 DIAGNOSIS — H353290 Exudative age-related macular degeneration, unspecified eye, stage unspecified: Secondary | ICD-10-CM

## 2013-06-08 ENCOUNTER — Ambulatory Visit (INDEPENDENT_AMBULATORY_CARE_PROVIDER_SITE_OTHER): Payer: Medicare Other | Admitting: Cardiology

## 2013-06-08 ENCOUNTER — Encounter: Payer: Self-pay | Admitting: Cardiology

## 2013-06-08 VITALS — BP 146/80 | HR 87 | Ht >= 80 in | Wt 127.4 lb

## 2013-06-08 DIAGNOSIS — Z7901 Long term (current) use of anticoagulants: Secondary | ICD-10-CM

## 2013-06-08 DIAGNOSIS — I4891 Unspecified atrial fibrillation: Secondary | ICD-10-CM | POA: Diagnosis not present

## 2013-06-08 DIAGNOSIS — I10 Essential (primary) hypertension: Secondary | ICD-10-CM

## 2013-06-08 DIAGNOSIS — I350 Nonrheumatic aortic (valve) stenosis: Secondary | ICD-10-CM

## 2013-06-08 DIAGNOSIS — I359 Nonrheumatic aortic valve disorder, unspecified: Secondary | ICD-10-CM | POA: Diagnosis not present

## 2013-06-08 NOTE — Patient Instructions (Signed)
Continue your current therapy  Let me know if you experience symptoms of chest pain, dyspnea, or dizziness.  I will see you in 6 months with an Echocardiogram

## 2013-06-08 NOTE — Progress Notes (Signed)
Rebecca [name] Date of Birth: [birth year] Medical Record #[id number]  History of Present Illness: Rebecca [name] is seen for followup today of aortic stenosis.  She has paroxysmal atrial fibrillation. She is now on Coumadin. She denies any syncope or near syncope. She's had no palpitations. She denies any chest pain or shortness of breath. She is still able to do all her own housework and take out the garbage. She is able to go up stairs without difficulty. Echocardiogram was repeated recently  as noted below. This led to a cardiac cath also noted below. She does note some bruising in her groin area without pain.  Current Outpatient Prescriptions on File Prior to Visit  Medication Sig Dispense Refill  . atorvastatin (LIPITOR) 20 MG tablet Take 20 mg by mouth daily.      [name] Besifloxacin HCl (BESIVANCE) 0.6 % SUSP Place 1 drop into the right eye 3 (three) times daily as needed (Uses for 3 days following each eye injection.).       [name] calcium citrate (CALCITRATE - DOSED IN MG ELEMENTAL CALCIUM) 950 MG tablet Take 1 tablet by mouth daily.      [name] donepezil (ARICEPT) 5 MG tablet TAKE 1 TABLET IN THE EVENING  30 tablet  6  . Multiple Vitamin (MULTIVITAMIN) capsule Take 1 capsule by mouth daily.       No current facility-administered medications on file prior to visit.    No Known Allergies  Past Medical History  Diagnosis Date  . Hypertension   . High cholesterol   . Macular degeneration   . UTI (lower urinary tract infection)   . Aortic stenosis   . Syncope 08/21/2012  . Paroxysmal atrial fibrillation     History reviewed. No pertinent past surgical history.  History  Smoking status  . Former Smoker -- 0.40 packs/day for 30 years  . Types: Cigarettes  . Quit date: 04/30/1960  Smokeless tobacco  . Former User    History  Alcohol Use No    Family History  Problem Relation Age of Onset  . Heart attack Mother   . Heart Problems Father   . Cerebral aneurysm Sister     Review of  Systems: The review of systems is positive for chronic memory loss. She is on Aricept.  All other systems were reviewed and are negative.  Physical Exam: BP 146/80  Pulse 87  Ht 7' (2.134 m)  Wt 127 lb 6.4 oz (57.788 kg)  BMI 12.69 kg/m2 She is a pleasant elderly white female in no acute distress. HEENT: Normocephalic, atraumatic. Pupils are equal round and reactive to light accommodation. Extraocular movements are full. Oropharynx is clear. There is good dental care. Neck is supple without JVD, adenopathy, or thyromegaly. Carotid upstrokes are reduced. Lungs: Clear Cardiovascular: Regular rate and rhythm. Normal S1 with diminished S2. There is a harsh grade 2-3/6 systolic murmur at the right upper sternal border. Abdomen: Soft and nontender. No masses or bruits. Bowel sounds positive. Extremities: Normal radial, femoral, and pedal pulses. No cyanosis or edema. Bruising in the right groin without hematoma or bruit. Skin: Dry Neuro: Alert and oriented x3. Cranial nerves II through XII are intact.  LABORATORY DATA:  Lab Results  Component Value Date   WBC 7.8 05/19/2013   HGB 14.3 05/19/2013   HCT 42.6 05/19/2013   PLT 237.0 05/19/2013   GLUCOSE 94 05/19/2013   ALT 23 09/30/2012   AST 27 09/30/2012   NA 142 05/19/2013   K 3.7 05/19/2013  CL 101 05/19/2013   CREATININE 1.0 05/19/2013   BUN 14 05/19/2013   CO2 32 05/19/2013   TSH 1.117 07/09/2012   INR 0.98 05/25/2013   Echo:Transthoracic Echocardiography  Patient: Rebecca [name], Rebecca [name] MR #: [id number] Study Date: 05/06/2013 Gender: F Age: 80 Height: 160cm Weight: 54.4kg BSA: 1.2167m^2 Pt. Status: Room:  ATTENDING [place], Breyah Akhter ORDERING [place], Anzlee Hinesley REFERRING [place], Karri Kallenbach SONOGRAPHER Aida RaiderNaTashia Rodgers, RDCS PERFORMING [facility], Outpatient cc:  ------------------------------------------------------------ LV EF: 40% - 45%  ------------------------------------------------------------ Indications: 424.1 Aortic valve  disorders.  ------------------------------------------------------------ History: PMH: Acquired from the patient and from the patient's chart. PMH: Syncope. Paroxysmal Atrial Fibrillation. Risk factors: Former tobacco use. Hypertension. Dyslipidemia.  ------------------------------------------------------------ Study Conclusions  - Left ventricle: The cavity size was normal. Wall thickness was increased in a pattern of moderate LVH. Systolic function was mildly to moderately reduced. The estimated ejection fraction was in the range of 40% to 45%. Diffuse hypokinesis. There was an increased relative contribution of atrial contraction to ventricular filling. Doppler parameters are consistent with abnormal left ventricular relaxation (grade 1 diastolic dysfunction). - Aortic valve: There was severe stenosis. Mild regurgitation. - Left atrium: The atrium was mildly dilated. Transthoracic echocardiography. M-mode, complete 2D, spectral Doppler, and color Doppler. Height: Height: 160cm. Height: 63in. Weight: Weight: 54.4kg. Weight: 119.8lb. Body mass index: BMI: 21.3kg/m^2. Body surface area: BSA: 1.9567m^2. Blood pressure: 160/69. Patient status: Outpatient. Location: [facility]  ------------------------------------------------------------  ------------------------------------------------------------ Left ventricle: The cavity size was normal. Wall thickness was increased in a pattern of moderate LVH. Systolic function was mildly to moderately reduced. The estimated ejection fraction was in the range of 40% to 45%. Diffuse hypokinesis. There was an increased relative contribution of atrial contraction to ventricular filling. Early diastolic septal annular tissue Doppler velocities Ea were abnormal. Doppler parameters are consistent with abnormal left ventricular relaxation (grade 1 diastolic dysfunction).  ------------------------------------------------------------ Aortic  valve: Moderately thickened, moderately calcified leaflets. Doppler: There was severe stenosis. Mild regurgitation. VTI ratio of LVOT to aortic valve: 0.26. Valve area: 0.74cm^2(VTI). Indexed valve area: 0.47cm^2/m^2 (VTI). Peak velocity ratio of LVOT to aortic valve: 0.25. Valve area: 0.71cm^2 (Vmax). Indexed valve area: 0.46cm^2/m^2 (Vmax). Mean gradient: 47mm Hg (S). Peak gradient: 65mm Hg (S).  ------------------------------------------------------------ Aorta: Aortic root: The aortic root was normal in size. Ascending aorta: The ascending aorta was normal in size.  ------------------------------------------------------------ Mitral valve: Moderately thickened, moderately calcified leaflets . Doppler: Trivial regurgitation. Peak gradient: 3mm Hg (D).  ------------------------------------------------------------ Left atrium: The atrium was mildly dilated.  ------------------------------------------------------------ Right ventricle: The cavity size was normal. Systolic function was normal.  ------------------------------------------------------------ Pulmonic valve: Structurally normal valve. Cusp separation was normal. Doppler: Transvalvular velocity was within the normal range. No regurgitation.  ------------------------------------------------------------ Tricuspid valve: Structurally normal valve. Leaflet separation was normal. Doppler: Transvalvular velocity was within the normal range. Trivial regurgitation.  ------------------------------------------------------------ Pulmonary artery: Systolic pressure was within the normal range.  ------------------------------------------------------------ Right atrium: The atrium was normal in size.  ------------------------------------------------------------ Pericardium: There was no pericardial effusion.  ------------------------------------------------------------ Systemic veins: Inferior vena cava: The vessel was  normal in size; the respirophasic diameter changes were in the normal range (= 50%); findings are consistent with normal central venous pressure.  ------------------------------------------------------------  2D measurements Normal Doppler measurements Normal Left ventricle Main pulmonary LVID ED, 36.6 mm 43-52 artery chord, Pressure, 24 mm Hg =30 PLAX S LVID ES, 23.6 mm 23-38 Left ventricle chord, Ea, lat 5.48 cm/s ------ PLAX ann, tiss FS, chord, 36 % >29 DP PLAX E/Ea, lat 16.2 ------ LVPW, ED 13.2  mm ------ ann, tiss IVS/LVPW 1.25 <1.3 DP ratio, ED Ea, med 4.06 cm/s ------ Ventricular septum ann, tiss IVS, ED 16.5 mm ------ DP LVOT E/Ea, med 21.8 ------ Diam, S 19 mm ------ ann, tiss 7 Area 2.84 cm^2 ------ DP Diam 19 mm ------ LVOT Aorta Peak vel, 101 cm/s ------ Root diam, 30 mm ------ S ED VTI, S 25.9 cm ------ AAo AP 36 mm ------ Stroke vol 73.4 ml ------ diam, S Stroke 47.1 ml/m^2 ------ Left atrium index AP dim 36 mm ------ Aortic valve AP dim 2.31 cm/m^2 <2.2 Peak vel, 404 cm/s ------ index S Mean vel, 328 cm/s ------ S VTI, S 100 cm ------ Mean 47 mm Hg ------ gradient, S Peak 65 mm Hg ------ gradient, S VTI ratio 0.26 ------ LVOT/AV Area, VTI 0.74 cm^2 ------ Area index 0.47 cm^2/m ------ (VTI) ^2 Peak vel 0.25 ------ ratio, LVOT/AV Area, Vmax 0.71 cm^2 ------ Area index 0.46 cm^2/m ------ (Vmax) ^2 Regurg PHT 482 ms ------ Mitral valve Peak E vel 88.8 cm/s ------ Peak A vel 151 cm/s ------ Decelerati 363 ms 150-23 on time 0 Peak 3 mm Hg ------ gradient, D Peak E/A 0.6 ------ ratio Tricuspid valve Regurg 217 cm/s ------ peak vel Peak RV-RA 19 mm Hg ------ gradient, S Systemic veins Estimated 5 mm Hg ------ CVP Right ventricle Pressure, 24 mm Hg <30 S Sa vel, 14.1 cm/s ------ lat ann, tiss DP  ------------------------------------------------------------ Prepared and Electronically Authenticated by  Kristeen Miss [id number]  Cardiac Catheterization Procedure Note  Name: Rebecca [name]  MRN: [id number]  DOB: 12/02/1925  Procedure: Right Heart Cath, Left Heart Cath, Selective Coronary Angiography, LV angiography  Indication: 80 yo WF with history of severe aortic stenosis. Recent Echo demonstrated decline in LV function.  Procedural Details: The right groin was prepped, draped, and anesthetized with 1% lidocaine. Using the modified Seldinger technique a 5 French sheath was placed in the right femoral artery and a 7 French sheath was placed in the right femoral vein. A Swan-Ganz catheter was used for the right heart catheterization. Standard protocol was followed for recording of right heart pressures and sampling of oxygen saturations. Fick cardiac output was calculated. Standard Judkins catheters were used for selective coronary angiography and left ventriculography. There were no immediate procedural complications. The patient was transferred to the post catheterization recovery area for further monitoring.  Procedural Findings:  Hemodynamics  RA 7/5 mean 4 mm Hg  RV 32/8 mm Hg  PA 24/11 mm Hg  PCWP 13/12 mean 10 mm Hg  LV 154/8 mm Hg  AO 115/442 mean 67 mm Hg.  AV mean gradient: 31 mm Hg, peak to peak gradient: 39 mm Hg. AV area: 0.9 cm2 with index 0.6  Oxygen saturations:  PA 71%  AO 93%  Cardiac Output (Fick) 4.82 L/min  Cardiac Index (Fick) 3.11 L/min/m2  Coronary angiography:  Coronary dominance: codominant  Left mainstem: Normal  Left anterior descending (LAD): Mild disease in the proximal vessel to 30%. The first diagonal is normal.  Left circumflex (LCx): Very large codominant vessel with multiple OM branches. Normal.  Right coronary artery (RCA): Normal.  Left ventriculography: Hand injection only. Left ventricular systolic function is normal, LVEF is estimated at 55-65%, there is no significant mitral regurgitation. The aortic valve and mitral annulus are heavily  calcified  Final Conclusions:  1. Mild nonobstructive CAD  2. Normal LV function.  3. Normal right heart pressures  4. Severe Aortic stenosis.  Recommendations: Will review Echo data. LVEF appears  much better by cath data.  Theron Arista [facility]  05/25/2013, 2:04 PM    Assessment / Plan: 1.  Aortic stenosis: Severe. She is completely asymptomatic and still quite active for her age. The concern on her Echo was that her EF had dropped. On Cath LV function looked good. Right heart pressures were normal. Nonobstructive CAD. I reviewed Echo and I think EF was underestimated. There is some septal dyssynergy due to LBBB but overall function is normal. At this point we will continue to follow and monitor closely. Will repeat Echo in 6 months. If she develops symptoms or has significant progression by Echo then AVR may be indicated.  2. Paroxysmal atrial fibrillation. Now on Coumadin. No significant sustained high rates.  3. Hyperlipidemia.  4. Hypertension-controlled

## 2013-06-17 ENCOUNTER — Ambulatory Visit (INDEPENDENT_AMBULATORY_CARE_PROVIDER_SITE_OTHER): Payer: Medicare Other | Admitting: *Deleted

## 2013-06-17 DIAGNOSIS — I4891 Unspecified atrial fibrillation: Secondary | ICD-10-CM

## 2013-06-17 DIAGNOSIS — Z5181 Encounter for therapeutic drug level monitoring: Secondary | ICD-10-CM | POA: Insufficient documentation

## 2013-06-17 DIAGNOSIS — Z7901 Long term (current) use of anticoagulants: Secondary | ICD-10-CM

## 2013-06-17 LAB — POCT INR: INR: 2.3

## 2013-07-08 ENCOUNTER — Encounter (INDEPENDENT_AMBULATORY_CARE_PROVIDER_SITE_OTHER): Payer: Medicare Other | Admitting: Ophthalmology

## 2013-07-08 DIAGNOSIS — I10 Essential (primary) hypertension: Secondary | ICD-10-CM | POA: Diagnosis not present

## 2013-07-08 DIAGNOSIS — H353290 Exudative age-related macular degeneration, unspecified eye, stage unspecified: Secondary | ICD-10-CM | POA: Diagnosis not present

## 2013-07-08 DIAGNOSIS — H3530 Unspecified macular degeneration: Secondary | ICD-10-CM | POA: Diagnosis not present

## 2013-07-08 DIAGNOSIS — H43819 Vitreous degeneration, unspecified eye: Secondary | ICD-10-CM

## 2013-07-08 DIAGNOSIS — H35039 Hypertensive retinopathy, unspecified eye: Secondary | ICD-10-CM

## 2013-07-08 DIAGNOSIS — H2510 Age-related nuclear cataract, unspecified eye: Secondary | ICD-10-CM

## 2013-07-09 ENCOUNTER — Other Ambulatory Visit: Payer: Self-pay | Admitting: Cardiology

## 2013-07-29 ENCOUNTER — Ambulatory Visit (INDEPENDENT_AMBULATORY_CARE_PROVIDER_SITE_OTHER): Payer: Medicare Other | Admitting: *Deleted

## 2013-07-29 DIAGNOSIS — I4891 Unspecified atrial fibrillation: Secondary | ICD-10-CM

## 2013-07-29 DIAGNOSIS — Z7901 Long term (current) use of anticoagulants: Secondary | ICD-10-CM | POA: Diagnosis not present

## 2013-07-29 DIAGNOSIS — Z5181 Encounter for therapeutic drug level monitoring: Secondary | ICD-10-CM

## 2013-07-29 LAB — POCT INR: INR: 1.3

## 2013-08-12 ENCOUNTER — Encounter (INDEPENDENT_AMBULATORY_CARE_PROVIDER_SITE_OTHER): Payer: Medicare Other | Admitting: Ophthalmology

## 2013-08-12 ENCOUNTER — Ambulatory Visit (INDEPENDENT_AMBULATORY_CARE_PROVIDER_SITE_OTHER): Payer: Medicare Other | Admitting: *Deleted

## 2013-08-12 DIAGNOSIS — H3530 Unspecified macular degeneration: Secondary | ICD-10-CM | POA: Diagnosis not present

## 2013-08-12 DIAGNOSIS — I4891 Unspecified atrial fibrillation: Secondary | ICD-10-CM

## 2013-08-12 DIAGNOSIS — I10 Essential (primary) hypertension: Secondary | ICD-10-CM | POA: Diagnosis not present

## 2013-08-12 DIAGNOSIS — H35039 Hypertensive retinopathy, unspecified eye: Secondary | ICD-10-CM | POA: Diagnosis not present

## 2013-08-12 DIAGNOSIS — H353290 Exudative age-related macular degeneration, unspecified eye, stage unspecified: Secondary | ICD-10-CM

## 2013-08-12 DIAGNOSIS — Z7901 Long term (current) use of anticoagulants: Secondary | ICD-10-CM | POA: Diagnosis not present

## 2013-08-12 DIAGNOSIS — H43819 Vitreous degeneration, unspecified eye: Secondary | ICD-10-CM

## 2013-08-12 DIAGNOSIS — Z5181 Encounter for therapeutic drug level monitoring: Secondary | ICD-10-CM

## 2013-08-12 DIAGNOSIS — H2510 Age-related nuclear cataract, unspecified eye: Secondary | ICD-10-CM

## 2013-08-12 LAB — POCT INR: INR: 2.9

## 2013-09-02 ENCOUNTER — Ambulatory Visit (INDEPENDENT_AMBULATORY_CARE_PROVIDER_SITE_OTHER): Payer: Medicare Other | Admitting: *Deleted

## 2013-09-02 DIAGNOSIS — Z7901 Long term (current) use of anticoagulants: Secondary | ICD-10-CM

## 2013-09-02 DIAGNOSIS — I4891 Unspecified atrial fibrillation: Secondary | ICD-10-CM | POA: Diagnosis not present

## 2013-09-02 DIAGNOSIS — Z5181 Encounter for therapeutic drug level monitoring: Secondary | ICD-10-CM

## 2013-09-02 LAB — POCT INR: INR: 2

## 2013-09-08 ENCOUNTER — Encounter (INDEPENDENT_AMBULATORY_CARE_PROVIDER_SITE_OTHER): Payer: Medicare Other | Admitting: Ophthalmology

## 2013-09-09 ENCOUNTER — Encounter (INDEPENDENT_AMBULATORY_CARE_PROVIDER_SITE_OTHER): Payer: Medicare Other | Admitting: Ophthalmology

## 2013-09-09 DIAGNOSIS — H35039 Hypertensive retinopathy, unspecified eye: Secondary | ICD-10-CM | POA: Diagnosis not present

## 2013-09-09 DIAGNOSIS — I10 Essential (primary) hypertension: Secondary | ICD-10-CM | POA: Diagnosis not present

## 2013-09-09 DIAGNOSIS — H353290 Exudative age-related macular degeneration, unspecified eye, stage unspecified: Secondary | ICD-10-CM | POA: Diagnosis not present

## 2013-09-09 DIAGNOSIS — H3530 Unspecified macular degeneration: Secondary | ICD-10-CM | POA: Diagnosis not present

## 2013-09-09 DIAGNOSIS — H43819 Vitreous degeneration, unspecified eye: Secondary | ICD-10-CM

## 2013-10-01 ENCOUNTER — Ambulatory Visit (INDEPENDENT_AMBULATORY_CARE_PROVIDER_SITE_OTHER): Payer: Medicare Other

## 2013-10-01 DIAGNOSIS — Z7901 Long term (current) use of anticoagulants: Secondary | ICD-10-CM | POA: Diagnosis not present

## 2013-10-01 DIAGNOSIS — I4891 Unspecified atrial fibrillation: Secondary | ICD-10-CM | POA: Diagnosis not present

## 2013-10-01 DIAGNOSIS — Z5181 Encounter for therapeutic drug level monitoring: Secondary | ICD-10-CM

## 2013-10-01 LAB — POCT INR: INR: 2.3

## 2013-10-07 ENCOUNTER — Encounter (INDEPENDENT_AMBULATORY_CARE_PROVIDER_SITE_OTHER): Payer: Medicare Other | Admitting: Ophthalmology

## 2013-10-07 DIAGNOSIS — H3530 Unspecified macular degeneration: Secondary | ICD-10-CM | POA: Diagnosis not present

## 2013-10-07 DIAGNOSIS — H43819 Vitreous degeneration, unspecified eye: Secondary | ICD-10-CM

## 2013-10-07 DIAGNOSIS — H35039 Hypertensive retinopathy, unspecified eye: Secondary | ICD-10-CM

## 2013-10-07 DIAGNOSIS — H353290 Exudative age-related macular degeneration, unspecified eye, stage unspecified: Secondary | ICD-10-CM

## 2013-10-07 DIAGNOSIS — I10 Essential (primary) hypertension: Secondary | ICD-10-CM | POA: Diagnosis not present

## 2013-10-07 DIAGNOSIS — H2510 Age-related nuclear cataract, unspecified eye: Secondary | ICD-10-CM

## 2013-10-29 ENCOUNTER — Ambulatory Visit (INDEPENDENT_AMBULATORY_CARE_PROVIDER_SITE_OTHER): Payer: Medicare Other | Admitting: *Deleted

## 2013-10-29 DIAGNOSIS — Z7901 Long term (current) use of anticoagulants: Secondary | ICD-10-CM | POA: Diagnosis not present

## 2013-10-29 DIAGNOSIS — Z5181 Encounter for therapeutic drug level monitoring: Secondary | ICD-10-CM

## 2013-10-29 DIAGNOSIS — I4891 Unspecified atrial fibrillation: Secondary | ICD-10-CM | POA: Diagnosis not present

## 2013-10-29 LAB — POCT INR: INR: 3

## 2013-11-09 ENCOUNTER — Ambulatory Visit (INDEPENDENT_AMBULATORY_CARE_PROVIDER_SITE_OTHER): Payer: Medicare Other | Admitting: Diagnostic Neuroimaging

## 2013-11-09 ENCOUNTER — Encounter (INDEPENDENT_AMBULATORY_CARE_PROVIDER_SITE_OTHER): Payer: Self-pay

## 2013-11-09 ENCOUNTER — Encounter: Payer: Self-pay | Admitting: Diagnostic Neuroimaging

## 2013-11-09 ENCOUNTER — Encounter (INDEPENDENT_AMBULATORY_CARE_PROVIDER_SITE_OTHER): Payer: Medicare Other | Admitting: Ophthalmology

## 2013-11-09 VITALS — BP 124/68 | HR 77 | Temp 97.8°F | Ht 63.0 in | Wt 123.2 lb

## 2013-11-09 DIAGNOSIS — H35039 Hypertensive retinopathy, unspecified eye: Secondary | ICD-10-CM

## 2013-11-09 DIAGNOSIS — I359 Nonrheumatic aortic valve disorder, unspecified: Secondary | ICD-10-CM | POA: Diagnosis not present

## 2013-11-09 DIAGNOSIS — H353290 Exudative age-related macular degeneration, unspecified eye, stage unspecified: Secondary | ICD-10-CM

## 2013-11-09 DIAGNOSIS — F03A0 Unspecified dementia, mild, without behavioral disturbance, psychotic disturbance, mood disturbance, and anxiety: Secondary | ICD-10-CM

## 2013-11-09 DIAGNOSIS — F0390 Unspecified dementia without behavioral disturbance: Secondary | ICD-10-CM | POA: Diagnosis not present

## 2013-11-09 DIAGNOSIS — H3530 Unspecified macular degeneration: Secondary | ICD-10-CM

## 2013-11-09 DIAGNOSIS — H2510 Age-related nuclear cataract, unspecified eye: Secondary | ICD-10-CM

## 2013-11-09 DIAGNOSIS — I10 Essential (primary) hypertension: Secondary | ICD-10-CM

## 2013-11-09 DIAGNOSIS — H43819 Vitreous degeneration, unspecified eye: Secondary | ICD-10-CM

## 2013-11-09 DIAGNOSIS — I350 Nonrheumatic aortic (valve) stenosis: Secondary | ICD-10-CM
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
## 2013-11-13 ENCOUNTER — Telehealth: Payer: Self-pay | Admitting: Diagnostic Neuroimaging

## 2013-11-13 NOTE — Telephone Encounter (Signed)
Patient's husband has a question about the dose of his wife's medication. Please call to advise

## 2013-11-16 MED ORDER — DONEPEZIL HCL 10 MG PO TABS: 10.0000 mg | ORAL_TABLET | Freq: Every day | ORAL | Status: DC

## 2013-11-16 NOTE — Telephone Encounter (Signed)
Last OV note says: PLAN: 1. INCREASE donepezil to 10mg  daily Rx has now been updated and sent. I called back.  Spoke with patient.  They are aware.

## 2013-11-16 NOTE — Telephone Encounter (Signed)
Patient's spouse called stating he called the pharmacy to see if the new prescription for Donepezil 10 mg has been sent and it has not been sent to the pharmacy. Patient's spouse would like for this new prescription to be sent to the pharmacy.

## 2013-12-01 ENCOUNTER — Ambulatory Visit (INDEPENDENT_AMBULATORY_CARE_PROVIDER_SITE_OTHER): Payer: Medicare Other

## 2013-12-01 DIAGNOSIS — Z7901 Long term (current) use of anticoagulants: Secondary | ICD-10-CM

## 2013-12-01 DIAGNOSIS — I4891 Unspecified atrial fibrillation: Secondary | ICD-10-CM | POA: Diagnosis not present

## 2013-12-01 DIAGNOSIS — Z5181 Encounter for therapeutic drug level monitoring: Secondary | ICD-10-CM

## 2013-12-01 LAB — POCT INR: INR: 4.7

## 2013-12-02 ENCOUNTER — Emergency Department (HOSPITAL_COMMUNITY): Payer: Medicare Other

## 2013-12-02 ENCOUNTER — Observation Stay (HOSPITAL_COMMUNITY)
Admission: EM | Admit: 2013-12-02 | Discharge: 2013-12-04 | Disposition: A | Discharge status: Home / Self Care | Payer: Medicare Other | Attending: Cardiology | Admitting: Cardiology

## 2013-12-02 ENCOUNTER — Encounter (HOSPITAL_COMMUNITY): Payer: Self-pay | Admitting: Emergency Medicine

## 2013-12-02 ENCOUNTER — Ambulatory Visit (HOSPITAL_COMMUNITY): Payer: Medicare Other

## 2013-12-02 DIAGNOSIS — R0602 Shortness of breath: Secondary | ICD-10-CM | POA: Diagnosis not present

## 2013-12-02 DIAGNOSIS — Z87440 Personal history of urinary (tract) infections: Secondary | ICD-10-CM | POA: Insufficient documentation

## 2013-12-02 DIAGNOSIS — I4891 Unspecified atrial fibrillation: Secondary | ICD-10-CM | POA: Diagnosis not present

## 2013-12-02 DIAGNOSIS — R55 Syncope and collapse: Principal | ICD-10-CM | POA: Insufficient documentation

## 2013-12-02 DIAGNOSIS — Z79899 Other long term (current) drug therapy: Secondary | ICD-10-CM | POA: Diagnosis not present

## 2013-12-02 DIAGNOSIS — E7800 Pure hypercholesterolemia, unspecified: Secondary | ICD-10-CM | POA: Insufficient documentation

## 2013-12-02 DIAGNOSIS — I10 Essential (primary) hypertension: Secondary | ICD-10-CM | POA: Insufficient documentation

## 2013-12-02 DIAGNOSIS — Z87891 Personal history of nicotine dependence: Secondary | ICD-10-CM | POA: Insufficient documentation

## 2013-12-02 DIAGNOSIS — Z7901 Long term (current) use of anticoagulants: Secondary | ICD-10-CM | POA: Insufficient documentation

## 2013-12-02 DIAGNOSIS — R5381 Other malaise: Secondary | ICD-10-CM | POA: Diagnosis not present

## 2013-12-02 DIAGNOSIS — I350 Nonrheumatic aortic (valve) stenosis: Secondary | ICD-10-CM

## 2013-12-02 DIAGNOSIS — R404 Transient alteration of awareness: Secondary | ICD-10-CM | POA: Insufficient documentation

## 2013-12-02 DIAGNOSIS — I359 Nonrheumatic aortic valve disorder, unspecified: Secondary | ICD-10-CM | POA: Insufficient documentation

## 2013-12-02 DIAGNOSIS — I480 Paroxysmal atrial fibrillation: Secondary | ICD-10-CM

## 2013-12-02 DIAGNOSIS — R000 Tachycardia, unspecified: Secondary | ICD-10-CM | POA: Diagnosis not present

## 2013-12-02 DIAGNOSIS — R001 Bradycardia, unspecified: Secondary | ICD-10-CM | POA: Diagnosis not present

## 2013-12-02 DIAGNOSIS — R5383 Other fatigue: Secondary | ICD-10-CM | POA: Diagnosis not present

## 2013-12-02 HISTORY — DX: Cardiac murmur, unspecified: R01.1

## 2013-12-02 HISTORY — DX: Exudative age-related macular degeneration, right eye, stage unspecified: H35.3210

## 2013-12-02 LAB — TROPONIN I: Troponin I: <0.3 ng/mL (ref <0.30)

## 2013-12-02 LAB — I-STAT TROPONIN, ED
TROPONIN I, POC: 0 ng/mL (ref 0.00–0.08)
Troponin i, poc: 0 ng/mL (ref 0.00–0.08)

## 2013-12-02 LAB — COMPREHENSIVE METABOLIC PANEL
ALK PHOS: 91 U/L (ref 39–117)
ALT: 19 U/L (ref 0–35)
AST: 22 U/L (ref 0–37)
Albumin: 3.8 g/dL (ref 3.5–5.2)
Anion gap: 12 (ref 5–15)
BUN: 10 mg/dL (ref 6–23)
CO2: 29 mEq/L (ref 19–32)
Calcium: 9.1 mg/dL (ref 8.4–10.5)
Chloride: 104 mEq/L (ref 96–112)
Creatinine, Ser: 0.74 mg/dL (ref 0.50–1.10)
GFR calc Af Amer: 87 mL/min — ABNORMAL LOW (ref >90)
GFR calc non Af Amer: 75 mL/min — ABNORMAL LOW (ref >90)
GLUCOSE: 102 mg/dL — AB (ref 70–99)
POTASSIUM: 4 meq/L (ref 3.7–5.3)
Sodium: 145 mEq/L (ref 137–147)
TOTAL PROTEIN: 7.2 g/dL (ref 6.0–8.3)
Total Bilirubin: 0.5 mg/dL (ref 0.3–1.2)

## 2013-12-02 LAB — TSH: TSH: 1.4 u[IU]/mL (ref 0.350–4.500)

## 2013-12-02 LAB — CBC WITH DIFFERENTIAL/PLATELET
Basophils Absolute: 0 10*3/uL (ref 0.0–0.1)
Basophils Relative: 0 % (ref 0–1)
EOS ABS: 0 10*3/uL (ref 0.0–0.7)
EOS PCT: 0 % (ref 0–5)
HCT: 42.5 % (ref 36.0–46.0)
Hemoglobin: 13.9 g/dL (ref 12.0–15.0)
LYMPHS ABS: 1.3 10*3/uL (ref 0.7–4.0)
Lymphocytes Relative: 18 % (ref 12–46)
MCH: 29.9 pg (ref 26.0–34.0)
MCHC: 32.7 g/dL (ref 30.0–36.0)
MCV: 91.4 fL (ref 78.0–100.0)
Monocytes Absolute: 0.5 10*3/uL (ref 0.1–1.0)
Monocytes Relative: 7 % (ref 3–12)
NEUTROS PCT: 75 % (ref 43–77)
Neutro Abs: 5.3 10*3/uL (ref 1.7–7.7)
PLATELETS: 192 10*3/uL (ref 150–400)
RBC: 4.65 MIL/uL (ref 3.87–5.11)
RDW: 14.5 % (ref 11.5–15.5)
WBC: 7.2 10*3/uL (ref 4.0–10.5)

## 2013-12-02 LAB — URINALYSIS, ROUTINE W REFLEX MICROSCOPIC
Bilirubin Urine: NEGATIVE
Glucose, UA: NEGATIVE mg/dL
KETONES UR: NEGATIVE mg/dL
NITRITE: NEGATIVE
PH: 8 (ref 5.0–8.0)
Protein, ur: 30 mg/dL — AB
Specific Gravity, Urine: 1.016 (ref 1.005–1.030)
Urobilinogen, UA: 0.2 mg/dL (ref 0.0–1.0)

## 2013-12-02 LAB — CBG MONITORING, ED: GLUCOSE-CAPILLARY: 104 mg/dL — AB (ref 70–99)

## 2013-12-02 LAB — URINE MICROSCOPIC-ADD ON

## 2013-12-02 LAB — PROTIME-INR
INR: 3.11 — ABNORMAL HIGH (ref 0.00–1.49)
Prothrombin Time: 32 seconds — ABNORMAL HIGH (ref 11.6–15.2)

## 2013-12-02 MED ORDER — DONEPEZIL HCL 10 MG PO TABS
10.0000 mg | ORAL_TABLET | Freq: Every day | ORAL | Status: DC
  Administered 2013-12-02 – 2013-12-03 (×2): 10 mg via ORAL
  Filled 2013-12-02 (×3): qty 1

## 2013-12-02 MED ORDER — SODIUM CHLORIDE 0.9 % IV SOLN
Freq: Once | INTRAVENOUS | Status: AC
  Administered 2013-12-02: 100 mL/h via INTRAVENOUS

## 2013-12-02 MED ORDER — SODIUM CHLORIDE 0.9 % IJ SOLN: 3.0000 mL | INTRAMUSCULAR | Status: DC | PRN

## 2013-12-02 MED ORDER — ATORVASTATIN CALCIUM 20 MG PO TABS
20.0000 mg | ORAL_TABLET | Freq: Every day | ORAL | Status: DC
  Administered 2013-12-02 – 2013-12-03 (×2): 20 mg via ORAL
  Filled 2013-12-02 (×3): qty 1

## 2013-12-02 MED ORDER — WARFARIN 1.25 MG HALF TABLET
1.2500 mg | ORAL_TABLET | ORAL | Status: DC
  Administered 2013-12-03: 1.25 mg via ORAL
  Filled 2013-12-02: qty 1

## 2013-12-02 MED ORDER — SODIUM CHLORIDE 0.9 % IV SOLN: 250.0000 mL | INTRAVENOUS | Status: DC | PRN

## 2013-12-02 MED ORDER — ONDANSETRON HCL 4 MG/2ML IJ SOLN: 4.0000 mg | Freq: Four times a day (QID) | INTRAMUSCULAR | Status: DC | PRN

## 2013-12-02 MED ORDER — WARFARIN SODIUM 2.5 MG PO TABS
2.5000 mg | ORAL_TABLET | ORAL | Status: DC
  Administered 2013-12-02: 2.5 mg via ORAL
  Filled 2013-12-02 (×2): qty 1

## 2013-12-02 MED ORDER — ACETAMINOPHEN 325 MG PO TABS: 650.0000 mg | ORAL_TABLET | ORAL | Status: DC | PRN

## 2013-12-02 MED ORDER — SODIUM CHLORIDE 0.9 % IJ SOLN
3.0000 mL | Freq: Two times a day (BID) | INTRAMUSCULAR | Status: DC
  Administered 2013-12-02: 3 mL via INTRAVENOUS
  Administered 2013-12-03: 11:00:00 via INTRAVENOUS
  Administered 2013-12-03 – 2013-12-04 (×2): 3 mL via INTRAVENOUS

## 2013-12-02 MED ORDER — HEPARIN SODIUM (PORCINE) 5000 UNIT/ML IJ SOLN: 5000.0000 [IU] | Freq: Three times a day (TID) | INTRAMUSCULAR | Status: DC

## 2013-12-02 MED ORDER — WARFARIN - PHYSICIAN DOSING INPATIENT: Freq: Every day | Status: DC

## 2013-12-02 MED ORDER — NIFEDIPINE ER OSMOTIC RELEASE 90 MG PO TB24
90.0000 mg | ORAL_TABLET | Freq: Every day | ORAL | Status: DC
  Administered 2013-12-02 – 2013-12-04 (×3): 90 mg via ORAL
  Filled 2013-12-02 (×3): qty 1

## 2013-12-02 NOTE — ED Notes (Signed)
Provider at the bedside.  

## 2013-12-02 NOTE — ED Provider Notes (Signed)
Medical screening examination/treatment/procedure(s) were conducted as a shared visit with non-physician practitioner(s) and myself.  I personally evaluated the patient during the encounter.   EKG Interpretation   Date/Time:  Wednesday December 02 2013 08:25:54 EDT Ventricular Rate:  58 PR Interval:  169 QRS Duration: 139 QT Interval:  488 QTC Calculation: 479 R Axis:   -47 Text Interpretation:  Sinus rhythm Left bundle branch block No significant  change was found Confirmed by [facility]  MD, TREY ([id number]) on 12/02/2013 8:39:01  AM      80 year old female presenting after an episode of decreased responsiveness. Per family's report, she was sitting upright at the breakfast table and did not respond appropriately to their stimulation. Reportedly, she was upright in her chair during this time, maintain postural tone, eyes open, no convulsions.  On exam, well appearing, nontoxic, not distressed, normal respiratory effort, normal perfusion, alert, oriented.  Plan admit for syncope vs seizure.   Clinical Impression: Altered level of consciousness   Candyce ChurnJohn David Donaven Criswell III, MD 12/02/13 1520

## 2013-12-02 NOTE — Consult Note (Signed)
CARDIOLOGY CONSULT NOTE  Patient ID: Rebecca [name] MRN: [id number] DOB/AGE: 09-10-1925 80 y.o.  Admit date: 12/02/2013 Primary Cardiologist: Dr. SwazilandJordan Reason for Consultation: Syncope  HPI: 80 yo with severe aortic stenosis, paroxysmal atrial fibrillation, and HTN presented with syncopal episode.  Patient was worked up for AS in 1/15 with LHC/RHC and echo.  She does indeed have severe aortic stenosis but was asymptomatic so were following with watchful waiting.  She did, of note, have a syncope episode about 1 year ago while making her bed.  This had not recurred until today. She woke up this morning feeling "funny" and "not quite right" but cannot pinpoint exactly what the problem was.  She sat down at the table and was getting ready to eat her oatmeal when she slumped over.  Her husband saw this happen.  She was unresponsive and breathing slowly.  Family put her down on the floor and she eventually regained full consciousness after about 10 minutes.  After the event, she denies lightheadedness or palpitations.  No chest pain.  Currently, she feels normal.  ER evaluation so far is unremarkable with chronic LBBB on ECG (NSR) and normal troponin.  BP is mildly elevated.    Normally, she has no exertional dyspnea or chest pain.  She can climb stairs and do housework without problems.  She does not typically get lightheaded and has no history of orthostatic symptoms.  She has not felt palpitations.  She has mild dementia but is generally very functional.   Review of systems complete and found to be negative unless listed above in HPI  Past Medical History: 1. Dementia: Mild, on Aricept. 2. Syncope about 1 year ago 3. HTN 4. Atrial fibrillation: Paroxysmal 5. LBBB 6. Hyperlipidemia 7. Macular degeneration 8. Aortic stenosis: Severe.  Echo (1/15) with EF 40-45% (thought to be underestimated on review), severe AS with mean gradient 47 mmHg and AVA 0.74 cm^2, mild AI.  LHC/RHC (1/15) with normal  filling pressures and cardiac output, EF 55%, severe AS after crossing valve, nonobstructive mild CAD.  9. CAD: Mild on LHC 1/15.   Family History  Problem Relation Age of Onset  . Heart attack Mother   . Heart Problems Father   . Cerebral aneurysm Sister     History   Social History  . Marital Status: Married    Spouse Name: [name]    Number of Children: 3  . Years of Education: 11th   Occupational History  . Retired    Social History Main Topics  . Smoking status: Former Smoker -- 0.40 packs/day for 30 years    Types: Cigarettes    Quit date: 04/30/1960  . Smokeless tobacco: Former NeurosurgeonUser  . Alcohol Use: No  . Drug Use: No  . Sexual Activity: Not on file   Other Topics Concern  . Not on file   Social History Narrative   Pt lives at home with her daughter.   Married 65 yrs.   Caffeine Use: 2-3 cups daily    No current facility-administered medications for this encounter.   Current Outpatient Prescriptions  Medication Sig Dispense Refill  . acetaminophen (TYLENOL) 325 MG tablet Take 325 mg by mouth at bedtime.      [name]. atorvastatin (LIPITOR) 20 MG tablet Take 20 mg by mouth daily.      . calcium citrate (CALCITRATE - DOSED IN MG ELEMENTAL CALCIUM) 950 MG tablet Take 1 tablet by mouth daily.      [name]. donepezil (ARICEPT) 10 MG  tablet Take 1 tablet (10 mg total) by mouth daily.  30 tablet  12  . Multiple Vitamin (MULTIVITAMIN) capsule Take 1 capsule by mouth daily.      [name] NIFEdipine (PROCARDIA XL/ADALAT-CC) 90 MG 24 hr tablet Take 90 mg by mouth daily.      [name] warfarin (COUMADIN) 2.5 MG tablet Take 1.25-2.5 mg by mouth daily. 2.5 daily except take 1.25 mg on Monday and Thursday        Physical exam Blood pressure 142/63, pulse 61, temperature 97.8 F (36.6 C), temperature source Oral, resp. rate 18, SpO2 97.00%. General: NAD Neck: No JVD, no thyromegaly or thyroid nodule.  Lungs: Clear to auscultation bilaterally with normal respiratory effort. CV: Nondisplaced PMI.   Heart regular S1/S2, no S3/S4, 3/6 systolic crescendo-decrescendo murmur RUSB with S2 obscured.  No peripheral edema.  No carotid bruit.  Normal pedal pulses.  Abdomen: Soft, nontender, no hepatosplenomegaly, no distention.  Skin: Intact without lesions or rashes.  Neurologic: Alert and oriented x 3.  Psych: Normal affect. Extremities: No clubbing or cyanosis.  HEENT: Normal.   Labs:   Lab Results  Component Value Date   WBC 7.2 12/02/2013   HGB 13.9 12/02/2013   HCT 42.5 12/02/2013   MCV 91.4 12/02/2013   PLT 192 12/02/2013    Recent Labs Lab 12/02/13 0930  NA 145  K 4.0  CL 104  CO2 29  BUN 10  CREATININE 0.74  CALCIUM 9.1  PROT 7.2  BILITOT 0.5  ALKPHOS 91  ALT 19  AST 22  GLUCOSE 102*   Lab Results  Component Value Date   TROPONINI <0.30 07/09/2012       Radiology: - CT head: No acute changes - CXR: No acute findings  EKG: NSR, LBBB (chronic)  ASSESSMENT AND PLAN: 80 yo with severe aortic stenosis, paroxysmal atrial fibrillation, and HTN presented with syncopal episode. 1. Syncope: Etiology unclear.  She was sitting at the kitchen table and passed out.  She does have severe aortic stenosis but normally does not get lightheaded or have orthostatic symptoms.  Would have to postulate some other event accompanying the AS such as an arrhythmia or vasovagal event.  She had 1 other similar event about 1 year ago.  She has a history of PAF but did not feel her heart racing and is in NSR.  She has a chronic LBBB, so would be concerned that she could have had transient complete heart block/bradycardia.  She is on Aricept which can trigger bradyarrhythmias. - Would admit overnight and watch on telemetry.  - Consider replacing Aricept with Namenda. - If nothing seen on telemetry, as this is the second significant syncopal event would favor implanted loop recorder (Linq monitor).   2. Aortic stenosis: Severe by prior evaluation.  She was supposed to be getting a repeat echo today to  follow.  Given lack of baseline symptoms, do not think that severe AS is the only factor in syncope (may lower threshold to become hypotensive, however, in the setting of a vagal event or a bradyarrhyhmia).   - Will get echo. - After discharge, would have her evaluated in valve clinic with Drs Cornelius Moras and Excell Seltzer to see if she would be a TAVR candidate.  Though she really has minimal baseline symptoms, she has had a couple of syncopal episodes, and if she qualified for TAVR after valve clinic evaluation would be inclined to proceed with this.  3. Paroxysmal atrial fibrillation: She is on coumadin, will continue.  She  is in NSR.   Marca Ancona 12/02/2013 1:22 PM   Signed: @ME1 @ 12/02/2013, 12:54 PM Co-Sign MD

## 2013-12-02 NOTE — ED Provider Notes (Signed)
CSN: [id number]     Arrival date & time 12/02/13  0816 History   First MD Initiated Contact with Patient 12/02/13 [phone]     Chief Complaint  Patient presents with  . Loss of Consciousness     (Consider location/radiation/quality/duration/timing/severity/associated sxs/prior Treatment) HPI  Rebecca [name] is a 80 y.o. female with past medical history significant for hypertension, aortic stenosis, paroxysmal A. Fib, former tobacco smoker (12-pack-years) brought in for EMS for 10 minutes of loss of consciousness, prior syncopal events (clean catheterization in January 2015 as per daughter). Patient was sitting at the kitchen table tis morning, she was starting to eat breakfast of oatmeal. Her husband report she respond to verbal stimulus. Her head was leaned forward and her eyes were closed, posture was normal. He yelled her name and slapped her with little response. He went to his daughter's house next door and got daughter and granddaughter. 911 was called, patient was paced placed supine on the floor (note that she did not lose posture and fall to ground), and patient was moved to the ground she took a deep breath had several moments of rapid breathing and then opened her eyes. daughter tried multiple times to wake her by slapping her face and calling her name with no response. She appeared to be breathing normally, she was not cyanotic, there was no loss of bowel or bladder control, there was no tonic-clonic movements. She has no history of seizure disorder. Patient came to approximately 10 minutes after she lost consciousness, she was initially slightly confused thinking her granddaughter with her daughter and then immediately returned to her baseline mental status with no post ictal confusion. She was confused why she was not at the table. Patient denies any prodrome before the event. States that she woke up feeling vaguely sick and not herself this morning. She had a similar events with loss of  consciousness but it did not last this long. She denies chest pain, shortness of breath, reduced by mouth intake, fever, chills, abdominal pain, nausea vomiting, change in bowel or bladder habits, increasing peripheral edema, recent surgery or immobilization, calf pain or leg swelling. Family states that she had catheterization with no stent placement in January of 2015. She is anticoagulated with warfarin for paroxysmal A. fib. Her last INR was yesterday was 4.0. She is followed by cardiologist Dr. [name]. No new meds. Pt scheduled for Echo today. Patient denies increasing peripheral edema, dyspnea on exertion, orthopnea.     Past Medical History  Diagnosis Date  . Hypertension   . High cholesterol   . Macular degeneration   . UTI (lower urinary tract infection)   . Aortic stenosis   . Syncope 08/21/2012  . Paroxysmal atrial fibrillation    History reviewed. No pertinent past surgical history. Family History  Problem Relation Age of Onset  . Heart attack Mother   . Heart Problems Father   . Cerebral aneurysm Sister    History  Substance Use Topics  . Smoking status: Former Smoker -- 0.40 packs/day for 30 years    Types: Cigarettes    Quit date: 04/30/1960  . Smokeless tobacco: Former Neurosurgeon  . Alcohol Use: No   OB History   Grav Para Term Preterm Abortions TAB SAB Ect Mult Living                 Review of Systems  10 systems reviewed and found to be negative, except as noted in the HPI.   Allergies  Review of patient's  allergies indicates no known allergies.  Home Medications   Prior to Admission medications   Medication Sig Start Date End Date Taking? Authorizing Provider  acetaminophen (TYLENOL) 325 MG tablet Take 325 mg by mouth at bedtime.   Yes Historical Provider, MD  atorvastatin (LIPITOR) 20 MG tablet Take 20 mg by mouth daily.   Yes Historical Provider, MD  calcium citrate (CALCITRATE - DOSED IN MG ELEMENTAL CALCIUM) 950 MG tablet Take 1 tablet by mouth daily.    Yes Historical Provider, MD  donepezil (ARICEPT) 10 MG tablet Take 1 tablet (10 mg total) by mouth daily. 11/16/13  Yes Suanne Marker, MD  Multiple Vitamin (MULTIVITAMIN) capsule Take 1 capsule by mouth daily.   Yes Historical Provider, MD  NIFEdipine (PROCARDIA XL/ADALAT-CC) 90 MG 24 hr tablet Take 90 mg by mouth daily. 09/07/13  Yes Historical Provider, MD  warfarin (COUMADIN) 2.5 MG tablet Take 1.25-2.5 mg by mouth daily. 2.5 daily except take 1.25 mg on Monday and Thursday   Yes Historical Provider, MD   BP 173/60  Pulse 67  Temp(Src) 97.8 F (36.6 C) (Oral)  Resp 15  SpO2 95% Physical Exam  Nursing note and vitals reviewed. Constitutional: She is oriented to person, place, and time. She appears well-developed and well-nourished. No distress.  HENT:  Head: Normocephalic and atraumatic.  Mouth/Throat: Oropharynx is clear and moist.  No intraoral trauma  Eyes: Conjunctivae and EOM are normal. Pupils are equal, round, and reactive to light.  Neck: Normal range of motion. Neck supple.  No midline C-spine  tenderness to palpation or step-offs appreciated. Patient has full range of motion without pain.   Cardiovascular: Normal rate, regular rhythm and intact distal pulses.   Pulmonary/Chest: Effort normal and breath sounds normal. No stridor. No respiratory distress. She has no wheezes. She has no rales. She exhibits no tenderness.  Abdominal: Soft. Bowel sounds are normal. She exhibits no distension and no mass. There is no tenderness. There is no rebound and no guarding.  Musculoskeletal: Normal range of motion. She exhibits no edema and no tenderness.  Neurological: She is alert and oriented to person, place, and time.  II-Visual fields grossly intact. III/IV/VI-Extraocular movements intact.  Pupils reactive bilaterally. V/VII-Smile symmetric, equal eyebrow raise,  facial sensation intact VIII- Hearing grossly intact IX/X-Normal gag XI-bilateral shoulder shrug XII-midline tongue  extension Motor: 5/5 bilaterally with normal tone and bulk    Psychiatric: She has a normal mood and affect.    ED Course  Procedures (including critical care time) Labs Review Labs Reviewed  COMPREHENSIVE METABOLIC PANEL - Abnormal; Notable for the following:    Glucose, Bld 102 (*)    GFR calc non Af Amer 75 (*)    GFR calc Af Amer 87 (*)    All other components within normal limits  PROTIME-INR - Abnormal; Notable for the following:    Prothrombin Time 32.0 (*)    INR 3.11 (*)    All other components within normal limits  URINALYSIS, ROUTINE W REFLEX MICROSCOPIC - Abnormal; Notable for the following:    Hgb urine dipstick SMALL (*)    Protein, ur 30 (*)    Leukocytes, UA SMALL (*)    All other components within normal limits  CBG MONITORING, ED - Abnormal; Notable for the following:    Glucose-Capillary 104 (*)    All other components within normal limits  CBC WITH DIFFERENTIAL  URINE MICROSCOPIC-ADD ON  TSH  TROPONIN I  TROPONIN I  TROPONIN I  POCT CBG (  FASTING - GLUCOSE)-MANUAL ENTRY  I-STAT TROPOININ, ED  I-STAT TROPOININ, ED    Imaging Review Dg Chest 2 View  12/02/2013   CLINICAL DATA:  Shortness of Breath  EXAM: CHEST  2 VIEW  COMPARISON:  July 08, 2012  FINDINGS: There is no edema or consolidation. The heart size and pulmonary vascularity are normal. There is atherosclerotic change in aorta. No adenopathy. There are no appreciable bone lesions.  IMPRESSION: No edema or consolidation.  Atherosclerotic change in aorta.   Electronically Signed   By: Bretta BangWilliam  Woodruff M.D.   On: 12/02/2013 09:09   Ct Head Wo Contrast  12/02/2013   CLINICAL DATA:  Syncope  EXAM: CT HEAD WITHOUT CONTRAST  CT CERVICAL SPINE WITHOUT CONTRAST  TECHNIQUE: Multidetector CT imaging of the head and cervical spine was performed following the standard protocol without intravenous contrast. Multiplanar CT image reconstructions of the cervical spine were also generated.  COMPARISON:  04/18/2012   FINDINGS: CT HEAD FINDINGS  Mild atrophy. Chronic ischemic changes in the periventricular white matter. No mass effect, midline shift, or acute intracranial hemorrhage. Mucosal thickening in the right maxillary sinus. Mastoid air cells clear. No skull fracture.  CT CERVICAL SPINE FINDINGS  Osteopenia. No acute fracture. No dislocation. There is disc space narrowing in C3-4, C4-5, C5-6, and C6-7 with posterior osteophytic ridging. An element of spinal stenosis is suspected. There is also some anterolisthesis of C2 upon C3 and C3 upon C4 that is degenerative in nature. Ankylosis of the facet joints from C2 through C4 is present uncovertebral osteophytes encroach upon the neural foramina at C4-5 and C5-6. No obvious spinal hematoma. No obvious soft tissue injury. Thyroid gland is lobulated and heterogeneous. Emphysema at the lung apices is noted.  IMPRESSION: No acute intracranial pathology.  Chronic ischemic changes.  No acute cervical spine injury.  Chronic changes are noted.   Electronically Signed   By: Maryclare BeanArt  Hoss M.D.   On: 12/02/2013 09:11   Ct Cervical Spine Wo Contrast  12/02/2013   CLINICAL DATA:  Syncope  EXAM: CT HEAD WITHOUT CONTRAST  CT CERVICAL SPINE WITHOUT CONTRAST  TECHNIQUE: Multidetector CT imaging of the head and cervical spine was performed following the standard protocol without intravenous contrast. Multiplanar CT image reconstructions of the cervical spine were also generated.  COMPARISON:  04/18/2012  FINDINGS: CT HEAD FINDINGS  Mild atrophy. Chronic ischemic changes in the periventricular white matter. No mass effect, midline shift, or acute intracranial hemorrhage. Mucosal thickening in the right maxillary sinus. Mastoid air cells clear. No skull fracture.  CT CERVICAL SPINE FINDINGS  Osteopenia. No acute fracture. No dislocation. There is disc space narrowing in C3-4, C4-5, C5-6, and C6-7 with posterior osteophytic ridging. An element of spinal stenosis is suspected. There is also some  anterolisthesis of C2 upon C3 and C3 upon C4 that is degenerative in nature. Ankylosis of the facet joints from C2 through C4 is present uncovertebral osteophytes encroach upon the neural foramina at C4-5 and C5-6. No obvious spinal hematoma. No obvious soft tissue injury. Thyroid gland is lobulated and heterogeneous. Emphysema at the lung apices is noted.  IMPRESSION: No acute intracranial pathology.  Chronic ischemic changes.  No acute cervical spine injury.  Chronic changes are noted.   Electronically Signed   By: Maryclare BeanArt  Hoss M.D.   On: 12/02/2013 09:11     EKG Interpretation   Date/Time:  Wednesday December 02 2013 08:25:54 EDT Ventricular Rate:  58 PR Interval:  169 QRS Duration: 139 QT Interval:  488  QTC Calculation: 479 R Axis:   -47 Text Interpretation:  Sinus rhythm Left bundle branch block No significant  change was found Confirmed by [facility]  MD, TREY ([id number]) on 12/02/2013 8:39:01  AM      MDM   Final diagnoses:  Aortic stenosis  Paroxysmal atrial fibrillation  Syncope, unspecified syncope type    Filed Vitals:   12/02/13 1159 12/02/13 1200 12/02/13 1313 12/02/13 1330  BP: 142/63 159/56 179/72 173/60  Pulse: 61 64 64 67  Temp:      TempSrc:      Resp: 18  15   SpO2: 97% 96% 98% 95%    Medications  acetaminophen (TYLENOL) tablet 650 mg (not administered)  ondansetron (ZOFRAN) injection 4 mg (not administered)  sodium chloride 0.9 % injection 3 mL (not administered)  sodium chloride 0.9 % injection 3 mL (not administered)  0.9 %  sodium chloride infusion (not administered)  heparin injection 5,000 Units (not administered)  0.9 %  sodium chloride infusion (100 mL/hr Intravenous New Bag/Given 12/02/13 1055)    Rebecca [name] is a 80 y.o. female presenting with presenting with loss of consciousness for approximately 10 minutes, patient appeared to be breathing, no CPR was initiated. Patient was nonresponsive to painful stimuli or voice  at that time. There was no prodrome,  patient woke up and was clearheaded, no post ictal confusion, no loss of bowel or bladder control,  Cardiology consult from Dr. Shirlee Latch appreciated: Recommend hospitalist admission for monitoring and they will consult. Notes that patient has severe aortic stenosis and that she has been asymptomatic. States that this would be very atypical presentation of cardiac issues  Neurology consult from Dr. Amada Jupiter appreciated: I have described the events leading up to evaluation in the ED. He states that this is unlikely complex partial seizure as her eyes were closed and she opened her eyes after she was moved to the floor. States that this is not atypical for dementia patients even patients with early dementia to have new onset seizures, however based on the closed eyes he doubts that this is seizures, would not recommend starting antiseizure medications. Basis would be a very atypical presentation of TIA.   Discussed case with hospitalist Dr. Bennie Pierini and Dr. Shirlee Latch as to who will admit. Ultimately, it  appears that Dr. Shirlee Latch has put in admission orders.   Pt stable at time of admission.   Wynetta Emery, PA-C 12/02/13 1430  [address] PA-C 12/02/13 1455

## 2013-12-02 NOTE — ED Notes (Signed)
80 yo female via [facility] with witnesses Syncopal Episode. Pt was sitting at the breakfast table with husband and slumped over for a couple of minutes. Daughter arrived and called 911. Upon EMS arrival pt was conscious and alert. Pt is on coumadin and has standing appointment today with Cardiology Dr. SwazilandJordan for Heart Valve Issue. EKG showed mild elevation and had + Orthostatics. HX HTN. Vitals stable.

## 2013-12-02 NOTE — ED Provider Notes (Signed)
Medical screening examination/treatment/procedure(s) were conducted as a shared visit with non-physician practitioner(s) and myself.  I personally evaluated the patient during the encounter.   EKG Interpretation   Date/Time:  Wednesday December 02 2013 08:25:54 EDT Ventricular Rate:  58 PR Interval:  169 QRS Duration: 139 QT Interval:  488 QTC Calculation: 479 R Axis:   -47 Text Interpretation:  Sinus rhythm Left bundle branch block No significant  change was found Confirmed by [facility]  MD, TREY ([id number]) on 12/02/2013 8:39:01  AM        Rebecca ChurnJohn David Nieve Rojero III, MD 12/02/13 [phone]

## 2013-12-02 NOTE — ED Notes (Signed)
Rebecca [name] notified of POCT CBG results

## 2013-12-02 NOTE — ED Notes (Signed)
Pt undressed, in gown, on monitor, continuous pulse oximetry and blood pressure cuff; family at bedside 

## 2013-12-03 DIAGNOSIS — I359 Nonrheumatic aortic valve disorder, unspecified: Secondary | ICD-10-CM | POA: Diagnosis not present

## 2013-12-03 DIAGNOSIS — R404 Transient alteration of awareness: Secondary | ICD-10-CM | POA: Diagnosis not present

## 2013-12-03 DIAGNOSIS — R55 Syncope and collapse: Secondary | ICD-10-CM | POA: Diagnosis not present

## 2013-12-03 DIAGNOSIS — I4891 Unspecified atrial fibrillation: Secondary | ICD-10-CM | POA: Diagnosis not present

## 2013-12-03 LAB — BASIC METABOLIC PANEL
Anion gap: 13 (ref 5–15)
BUN: 15 mg/dL (ref 6–23)
CALCIUM: 8.6 mg/dL (ref 8.4–10.5)
CO2: 26 mEq/L (ref 19–32)
CREATININE: 0.7 mg/dL (ref 0.50–1.10)
Chloride: 103 mEq/L (ref 96–112)
GFR calc Af Amer: 89 mL/min — ABNORMAL LOW (ref >90)
GFR, EST NON AFRICAN AMERICAN: 77 mL/min — AB (ref >90)
GLUCOSE: 105 mg/dL — AB (ref 70–99)
Potassium: 3.6 mEq/L — ABNORMAL LOW (ref 3.7–5.3)
SODIUM: 142 meq/L (ref 137–147)

## 2013-12-03 LAB — CBC
HCT: 40.8 % (ref 36.0–46.0)
Hemoglobin: 13.3 g/dL (ref 12.0–15.0)
MCH: 29.3 pg (ref 26.0–34.0)
MCHC: 32.6 g/dL (ref 30.0–36.0)
MCV: 89.9 fL (ref 78.0–100.0)
Platelets: 216 10*3/uL (ref 150–400)
RBC: 4.54 MIL/uL (ref 3.87–5.11)
RDW: 14.5 % (ref 11.5–15.5)
WBC: 7.7 10*3/uL (ref 4.0–10.5)

## 2013-12-03 LAB — PROTIME-INR
INR: 2.64 — ABNORMAL HIGH (ref 0.00–1.49)
Prothrombin Time: 28.2 seconds — ABNORMAL HIGH (ref 11.6–15.2)

## 2013-12-03 LAB — TROPONIN I

## 2013-12-03 NOTE — Progress Notes (Signed)
80 yo with severe aortic stenosis, paroxysmal atrial fibrillation, and HTN presented with syncopal episode. Patient was worked up for AS in 1/15 with LHC/RHC and echo. She does indeed have severe aortic stenosis but was asymptomatic so were following with watchful waiting. She did, of note, have a syncope episode about 1 year ago while making her bed. This had not recurred until yesterday. She woke up yesterday morning feeling "funny" and "not quite right" but cannot pinpoint exactly what the problem was. She sat down at the table and was getting ready to eat her oatmeal when she slumped over. Her husband saw this happen. She was unresponsive and breathing slowly. Family put her down on the floor and she eventually regained full consciousness after about 10 minutes. After the event, she denies lightheadedness or palpitations  Echo results pending  Subjective: No complaints of chest pain, SOB, dizziness  Objective: Vital signs in last 24 hours: Temp:  [97.3 F (36.3 C)-98 F (36.7 C)] 97.6 F (36.4 C) (08/06 1610) Pulse Rate:  [63-79] 79 (08/06 0608) Resp:  [15-18] 18 (08/06 0608) BP: (143-179)/(55-73) 145/73 mmHg (08/06 0608) SpO2:  [94 %-98 %] 97 % (08/06 0608) Weight:  [119 lb 14.4 oz (54.386 kg)-120 lb (54.432 kg)] 119 lb 14.4 oz (54.386 kg) (08/06 9604) Weight change:  Last BM Date: 12/02/13 Intake/Output from previous day: -500 08/05 0701 - 08/06 0700 In: -  Out: 500 [Urine:500] Intake/Output this shift:    PE: General:Pleasant affect, NAD Skin:Warm and dry, brisk capillary refill HEENT:normocephalic, sclera clear, mucus membranes moist Neck:supple, no JVD Heart:S1S2 RRR with 3/6 systolic murmur, no gallup, rub or click Lungs:clear without rales, rhonchi, or wheezes VWU:JWJX, non tender, + BS, do not palpate liver spleen or masses Ext:no lower ext edema, 2+ pedal pulses, 2+ radial pulses Neuro:alert and oriented, MAE, follows commands, + facial symmetry   EKG SR  LBBB Tele: ST in 120s and brady at 47 at times.  Lab Results:  Recent Labs  12/02/13 0930 12/03/13 0305  WBC 7.2 7.7  HGB 13.9 13.3  HCT 42.5 40.8  PLT 192 216   BMET  Recent Labs  12/02/13 0930 12/03/13 0305  NA 145 142  K 4.0 3.6*  CL 104 103  CO2 29 26  GLUCOSE 102* 105*  BUN 10 15  CREATININE 0.74 0.70  CALCIUM 9.1 8.6    Recent Labs  12/02/13 2115 12/03/13 0305  TROPONINI <0.30 <0.30    No results found for this basename: CHOL, HDL, LDLCALC, LDLDIRECT, TRIG, CHOLHDL   No results found for this basename: HGBA1C     Lab Results  Component Value Date   TSH 1.400 12/02/2013    Hepatic Function Panel  Recent Labs  12/02/13 0930  PROT 7.2  ALBUMIN 3.8  AST 22  ALT 19  ALKPHOS 91  BILITOT 0.5   No results found for this basename: CHOL,  in the last 72 hours No results found for this basename: PROTIME,  in the last 72 hours     Studies/Results: Dg Chest 2 View  12/02/2013   CLINICAL DATA:  Shortness of Breath  EXAM: CHEST  2 VIEW  COMPARISON:  July 08, 2012  FINDINGS: There is no edema or consolidation. The heart size and pulmonary vascularity are normal. There is atherosclerotic change in aorta. No adenopathy. There are no appreciable bone lesions.  IMPRESSION: No edema or consolidation.  Atherosclerotic change in aorta.   Electronically Signed   By: Chrissie Noa  Margarita Grizzle M.D.   On: 12/02/2013 09:09   Ct Head Wo Contrast  12/02/2013   CLINICAL DATA:  Syncope  EXAM: CT HEAD WITHOUT CONTRAST  CT CERVICAL SPINE WITHOUT CONTRAST  TECHNIQUE: Multidetector CT imaging of the head and cervical spine was performed following the standard protocol without intravenous contrast. Multiplanar CT image reconstructions of the cervical spine were also generated.  COMPARISON:  04/18/2012  FINDINGS: CT HEAD FINDINGS  Mild atrophy. Chronic ischemic changes in the periventricular white matter. No mass effect, midline shift, or acute intracranial hemorrhage. Mucosal thickening in  the right maxillary sinus. Mastoid air cells clear. No skull fracture.  CT CERVICAL SPINE FINDINGS  Osteopenia. No acute fracture. No dislocation. There is disc space narrowing in C3-4, C4-5, C5-6, and C6-7 with posterior osteophytic ridging. An element of spinal stenosis is suspected. There is also some anterolisthesis of C2 upon C3 and C3 upon C4 that is degenerative in nature. Ankylosis of the facet joints from C2 through C4 is present uncovertebral osteophytes encroach upon the neural foramina at C4-5 and C5-6. No obvious spinal hematoma. No obvious soft tissue injury. Thyroid gland is lobulated and heterogeneous. Emphysema at the lung apices is noted.  IMPRESSION: No acute intracranial pathology.  Chronic ischemic changes.  No acute cervical spine injury.  Chronic changes are noted.   Electronically Signed   By: Maryclare Bean M.D.   On: 12/02/2013 09:11   Ct Cervical Spine Wo Contrast  12/02/2013   CLINICAL DATA:  Syncope  EXAM: CT HEAD WITHOUT CONTRAST  CT CERVICAL SPINE WITHOUT CONTRAST  TECHNIQUE: Multidetector CT imaging of the head and cervical spine was performed following the standard protocol without intravenous contrast. Multiplanar CT image reconstructions of the cervical spine were also generated.  COMPARISON:  04/18/2012  FINDINGS: CT HEAD FINDINGS  Mild atrophy. Chronic ischemic changes in the periventricular white matter. No mass effect, midline shift, or acute intracranial hemorrhage. Mucosal thickening in the right maxillary sinus. Mastoid air cells clear. No skull fracture.  CT CERVICAL SPINE FINDINGS  Osteopenia. No acute fracture. No dislocation. There is disc space narrowing in C3-4, C4-5, C5-6, and C6-7 with posterior osteophytic ridging. An element of spinal stenosis is suspected. There is also some anterolisthesis of C2 upon C3 and C3 upon C4 that is degenerative in nature. Ankylosis of the facet joints from C2 through C4 is present uncovertebral osteophytes encroach upon the neural  foramina at C4-5 and C5-6. No obvious spinal hematoma. No obvious soft tissue injury. Thyroid gland is lobulated and heterogeneous. Emphysema at the lung apices is noted.  IMPRESSION: No acute intracranial pathology.  Chronic ischemic changes.  No acute cervical spine injury.  Chronic changes are noted.   Electronically Signed   By: Maryclare Bean M.D.   On: 12/02/2013 09:11    Medications: I have reviewed the patient's current medications. Scheduled Meds: . atorvastatin  20 mg Oral q1800  . donepezil  10 mg Oral QHS  . NIFEdipine  90 mg Oral Daily  . sodium chloride  3 mL Intravenous Q12H  . warfarin  1.25 mg Oral Once per day on Mon Thu  . warfarin  2.5 mg Oral Once per day on Sun Tue Wed Fri Sat  . Warfarin - Physician Dosing Inpatient   Does not apply q1800   Continuous Infusions:  PRN Meds:.sodium chloride, acetaminophen, ondansetron (ZOFRAN) IV, sodium chloride  Assessment/Plan: 1. Syncope: Etiology unclear. She was sitting at the kitchen table and passed out. She does have severe aortic stenosis but  normally does not get lightheaded or have orthostatic symptoms. Would have to postulate some other event accompanying the AS such as an arrhythmia or vasovagal event. She had 1 other similar event about 1 year ago. She has a history of PAF but did not feel her heart racing and is in NSR. She has a chronic LBBB, so would be concerned that she could have had transient complete heart block/bradycardia. She is on Aricept which can trigger bradyarrhythmias.  - Would admit overnight and watch on telemetry-several episodes of S tach at 127 during the night also with bradycardia.  - Consider replacing Aricept with Namenda.  - If nothing seen on telemetry, as this is the second significant syncopal event would favor implanted loop recorder (Linq monitor).  2. Aortic stenosis: Severe by prior evaluation. She repeat echo today done, results to follow. Given lack of baseline symptoms, do not think that severe  AS is the only factor in syncope (may lower threshold to become hypotensive, however, in the setting of a vagal event or a bradyarrhyhmia).  - Will get echo.  - Per Dr. Shirlee LatchMcLean After discharge, would have her evaluated in valve clinic with Drs Cornelius Moraswen and Excell Seltzerooper to see if she would be a TAVR candidate. Though she really has minimal baseline symptoms, she has had a couple of syncopal episodes, and if she qualified for TAVR after valve clinic evaluation would be inclined to proceed with this.  3. Paroxysmal atrial fibrillation: She is on coumadin, will continue. She is in NSR. INR today 2.64  ? D/C once results of echo known.     LOS: 1 day   Time spent with pt. :15 minutes. [facility] CommunityNGOLD,LAURA R  Nurse Practitioner Certified Pager [phone] or after 5pm and on weekends call [phone] 12/03/2013, 12:38 PM   Patient seen and examined. Agree with assessment and plan. Feels well. No chest pain or dyspnea, but patient has experienced syncope ? secondary to AS vs brady or tachy event vs hypovolemia or other etiology. Echo data pending from today. Pt is anxious for dc; will need valve clinic evaluation   Lennette Biharihomas A. Adlai Nieblas, MD, [facility] 12/03/2013 1:45 PM

## 2013-12-03 NOTE — Progress Notes (Signed)
UR completed 

## 2013-12-03 NOTE — Progress Notes (Signed)
  Echocardiogram 2D Echocardiogram has been performed.  Arvil ChacoFoster, Janira Mandell 12/03/2013, 10:17 AM

## 2013-12-04 ENCOUNTER — Ambulatory Visit: Payer: Medicare Other | Admitting: *Deleted

## 2013-12-04 ENCOUNTER — Other Ambulatory Visit: Payer: Self-pay | Admitting: Cardiology

## 2013-12-04 ENCOUNTER — Telehealth: Payer: Self-pay | Admitting: Cardiology

## 2013-12-04 DIAGNOSIS — I4891 Unspecified atrial fibrillation: Secondary | ICD-10-CM | POA: Diagnosis not present

## 2013-12-04 DIAGNOSIS — R55 Syncope and collapse: Secondary | ICD-10-CM

## 2013-12-04 DIAGNOSIS — I359 Nonrheumatic aortic valve disorder, unspecified: Secondary | ICD-10-CM | POA: Diagnosis not present

## 2013-12-04 DIAGNOSIS — I350 Nonrheumatic aortic (valve) stenosis: Secondary | ICD-10-CM

## 2013-12-04 DIAGNOSIS — R000 Tachycardia, unspecified: Secondary | ICD-10-CM

## 2013-12-04 DIAGNOSIS — R404 Transient alteration of awareness: Secondary | ICD-10-CM | POA: Diagnosis not present

## 2013-12-04 DIAGNOSIS — R001 Bradycardia, unspecified: Secondary | ICD-10-CM | POA: Diagnosis not present

## 2013-12-04 LAB — PROTIME-INR
INR: 2.28 — ABNORMAL HIGH (ref 0.00–1.49)
Prothrombin Time: 25.1 seconds — ABNORMAL HIGH (ref 11.6–15.2)

## 2013-12-04 NOTE — Telephone Encounter (Signed)
Calling because her mother is in the hospital and Rebecca [name] wants her to pick up a Monitor . If she does not get out of hospital before 5 pm what will she need to do , because the doctor does not want her to go thru the weekend without the monitor. Please leave a msg on her Voicemail because she is in a meeting and will not be able to pick up.   Thanks

## 2013-12-04 NOTE — Discharge Instructions (Signed)
We want you to wear a monitor, you should go by Dr. Elvis CoilJordan's new office [address] and have placed today when you leave the hospital.  You will follow up with Dr. SwazilandJordan in 2 - 3 weeks to discuss symptoms and valve.  Heart healthy diet.     No driving.  Coumadin checks as before.

## 2013-12-04 NOTE — Progress Notes (Signed)
Subjective: No complaints, no pain or dizziness  Objective: Vital signs in last 24 hours: Temp:  [97.7 F (36.5 C)-98.2 F (36.8 C)] 98.2 F (36.8 C) (08/07 0458) Pulse Rate:  [78-82] 82 (08/07 0458) Resp:  [16-18] 16 (08/07 0458) BP: (137-161)/(68-75) 137/68 mmHg (08/07 0458) SpO2:  [96 %-97 %] 96 % (08/07 0458) Weight:  [120 lb 6.4 oz (54.613 kg)] 120 lb 6.4 oz (54.613 kg) (08/07 0458) Weight change: 6.4 oz (0.181 kg) Last BM Date: 12/03/13 Intake/Output from previous day: none   Intake/Output this shift:    PE: General:Pleasant affect, NAD Skin:Warm and dry, brisk capillary refill HEENT:normocephalic, sclera clear, mucus membranes moist Heart:S1S2 RRR with 3/6 systolic murmur, no gallup, rub or click Lungs:clear without rales, rhonchi, or wheezes ZOX:WRUE, non tender, + BS, do not palpate liver spleen or masses Ext:no lower ext edema, 2+ pedal pulses, 2+ radial pulses Neuro:alert and oriented, MAE, follows commands, + facial symmetry   Tele, SR HR up 108 and only rare episode of Brady.   Lab Results:  Recent Labs  12/02/13 0930 12/03/13 0305  WBC 7.2 7.7  HGB 13.9 13.3  HCT 42.5 40.8  PLT 192 216   BMET  Recent Labs  12/02/13 0930 12/03/13 0305  NA 145 142  K 4.0 3.6*  CL 104 103  CO2 29 26  GLUCOSE 102* 105*  BUN 10 15  CREATININE 0.74 0.70  CALCIUM 9.1 8.6    Recent Labs  12/02/13 2115 12/03/13 0305  TROPONINI <0.30 <0.30    No results found for this basename: CHOL, HDL, LDLCALC, LDLDIRECT, TRIG, CHOLHDL   No results found for this basename: HGBA1C     Lab Results  Component Value Date   TSH 1.400 12/02/2013    Hepatic Function Panel  Recent Labs  12/02/13 0930  PROT 7.2  ALBUMIN 3.8  AST 22  ALT 19  ALKPHOS 91  BILITOT 0.5     Studies/Results: Echo:  Left ventricle: The cavity size was normal. There was moderate concentric hypertrophy. Systolic function was normal. The estimated ejection fraction was in the  range of 55% to 60%. Incoordinate septal motion. Doppler parameters are consistent with abnormal left ventricular relaxation (grade 1 diastolic dysfunction). The E/e&' ratio is >15, suggesting elevated LV filling pressure. - Aortic valve: The aortic valve is heavily calcified with restricted leaflet motion. Peak and mean gradients of 56 mmHg and 32 mmHg, respectively. Based on an LVOT diameter of 1.8 cm, the calculated AVA is 0.7-0.8 cm2. Consistent with severe aortic stenosis. The aortic valve gradient is probably undersampled. Valve area (VTI): 0.7 cm^2. Valve area (Vmax): 0.76 cm^2. - Mitral valve: Heavily calcified mitral annulus. There is mild mitral stenosis. Mild regurgitation. Valve area by continuity equation (using LVOT flow): 1.59 cm^2. - Left atrium: Moderately dilated (42 ml/m2).  Impressions:  - Compared to the prior echo in 04/2013, there has been an improvement in EF to 55-60%. There is still severe aortic stenosis, however, gradients are undersampled during this study. There is probably mild mitral stenosis as well.   Medications: I have reviewed the patient's current medications. Scheduled Meds: . atorvastatin  20 mg Oral q1800  . donepezil  10 mg Oral QHS  . NIFEdipine  90 mg Oral Daily  . sodium chloride  3 mL Intravenous Q12H  . warfarin  1.25 mg Oral Once per day on Mon Thu  . warfarin  2.5 mg Oral Once per day on Sun Tue Wed  Fri Sat  . Warfarin - Physician Dosing Inpatient   Does not apply q1800   Continuous Infusions:  PRN Meds:.sodium chloride, acetaminophen, ondansetron (ZOFRAN) IV, sodium chloride  Assessment/Plan: 1. Syncope: Etiology unclear. She was sitting at the kitchen table and passed out. She does have severe aortic stenosis but normally does not get lightheaded or have orthostatic symptoms. Would have to postulate some other event accompanying the AS such as an arrhythmia or vasovagal event. She had 1 other similar event about 1 year ago. She  has a history of PAF but did not feel her heart racing and is in NSR. She has a chronic LBBB, so would be concerned that she could have had transient complete heart block/bradycardia. She is on Aricept which can trigger bradyarrhythmias.  - Would admit overnight and watch on telemetry-several episodes of S tach at 127 during the night also with bradycardia first night, last pm less brady HR up to 108.  - Consider replacing Aricept with Namenda.  -  As this is the second significant syncopal event would favor implanted loop recorder (Linq monitor).  2. Aortic stenosis: Severe by prior evaluation. She repeat echo today done, results to follow. Given lack of baseline symptoms, do not think that severe AS is the only factor in syncope (may lower threshold to become hypotensive, however, in the setting of a vagal event or a bradyarrhyhmia).  - Per Dr. Shirlee LatchMcLean After discharge, would have her evaluated in valve clinic with Drs Cornelius Moraswen and Excell Seltzerooper to see if she would be a TAVR candidate. Though she really has minimal baseline symptoms, she has had a couple of syncopal episodes, and if she qualified for TAVR after valve clinic evaluation would be inclined to proceed with this.  3. Paroxysmal atrial fibrillation: She is on coumadin, will continue. She is in NSR. INR today 2.64     LOS: 2 days   Time spent with pt. :15 minutes. [facility],LAURA R  Nurse Practitioner Certified Pager [phone] or after 5pm and on weekends call [phone] 12/04/2013, 11:47 AM   Patient seen and examined. Agree with assessment and plan. Discussed f/u echo with Dr. SwazilandJordan. Severe AS. Maintaining NSR. Will plan to DC today with an event monitor,and close f/u with Dr. SwazilandJordan in 2 weeks. Discussed need for AVR ? TAVR potential; will need valve clinic evaluation.   Lennette Biharihomas A. Kelly, MD, [facility] 12/04/2013 12:16 PM

## 2013-12-04 NOTE — Discharge Summary (Signed)
Physician Discharge Summary       Patient ID: Rebecca [name] MRN: [id number] DOB/AGE: 1925-12-01 80 y.o.  Admit date: 12/02/2013 Discharge date: 12/04/2013  Discharge Diagnoses:  Principal Problem:   Syncope Active Problems:   Aortic stenosis, severe   HYPERTENSION   PAF (paroxysmal atrial fibrillation)   Long term (current) use of anticoagulants   Sinus tachycardia   Sinus bradycardia   Discharged Condition: good  Procedures: none  Hospital Course: 80 yo with severe aortic stenosis, paroxysmal atrial fibrillation, and HTN presented with syncopal episode. Patient was worked up for AS in 1/15 with LHC/RHC and echo. She does indeed have severe aortic stenosis but was asymptomatic so were following with watchful waiting. She did, of note, have a syncope episode about 1 year ago while making her bed. This had not recurred until day of admit. She woke up the morning of admit feeling "funny" and "not quite right" but cannot pinpoint exactly what the problem was. She sat down at the table and was getting ready to eat her oatmeal when she slumped over. Her husband saw this happen. She was unresponsive and breathing slowly. Family put her down on the floor and she eventually regained full consciousness after about 10 minutes. After the event, she denies lightheadedness or palpitations. No chest pain. In ER she felt normal. ER evaluation was unremarkable with chronic LBBB on ECG (NSR) and normal troponin. BP is mildly elevated.   Normally, she has no exertional dyspnea or chest pain. She can climb stairs and do housework without problems. She does not typically get lightheaded and has no history of orthostatic symptoms. She has not felt palpitations. She has mild dementia but is generally very functional.   Echo was done. Troponins were negative.   Pt has no further syncope, and ambulates well.  On Tele she had brief episodes of HR in the 40s and occ S. Tach with HR in 120s.  Dr. Tresa Endo and Dr.  [name] discussed her aortic valve.  For now we will have her wear 30 day event monitor and follow up with Dr. [name] for further decisions.      Consults: None  Significant Diagnostic Studies:  BMET    Component Value Date/Time   NA 142 12/03/2013 0305   K 3.6* 12/03/2013 0305   CL 103 12/03/2013 0305   CO2 26 12/03/2013 0305   GLUCOSE 105* 12/03/2013 0305   BUN 15 12/03/2013 0305   CREATININE 0.70 12/03/2013 0305   CALCIUM 8.6 12/03/2013 0305   GFRNONAA 77* 12/03/2013 0305   GFRAA 89* 12/03/2013 0305    CBC    Component Value Date/Time   WBC 7.7 12/03/2013 0305   RBC 4.54 12/03/2013 0305   HGB 13.3 12/03/2013 0305   HCT 40.8 12/03/2013 0305   PLT 216 12/03/2013 0305   MCV 89.9 12/03/2013 0305   MCH 29.3 12/03/2013 0305   MCHC 32.6 12/03/2013 0305   RDW 14.5 12/03/2013 0305   LYMPHSABS 1.3 12/02/2013 0930   MONOABS 0.5 12/02/2013 0930   EOSABS 0.0 12/02/2013 0930   BASOSABS 0.0 12/02/2013 0930    Troponin <0.30 X 3.   INR 2.28   TSH 1.4  U/A with small leukocytes neg nitrites   ECHO: Left ventricle: The cavity size was normal. There was moderate concentric hypertrophy. Systolic function was normal. The estimated ejection fraction was in the range of 55% to 60%. Incoordinate septal motion. Doppler parameters are consistent with abnormal left ventricular relaxation (grade 1 diastolic dysfunction). The E/e&'  ratio is >15, suggesting elevated LV filling pressure. - Aortic valve: The aortic valve is heavily calcified with restricted leaflet motion. Peak and mean gradients of 56 mmHg and 32 mmHg, respectively. Based on an LVOT diameter of 1.8 cm, the calculated AVA is 0.7-0.8 cm2. Consistent with severe aortic stenosis. The aortic valve gradient is probably undersampled. Valve area (VTI): 0.7 cm^2. Valve area (Vmax): 0.76 cm^2. - Mitral valve: Heavily calcified mitral annulus. There is mild mitral stenosis. Mild regurgitation. Valve area by continuity equation (using LVOT flow): 1.59 cm^2. - Left atrium:  Moderately dilated (42 ml/m2).  Impressions:  - Compared to the prior echo in 04/2013, there has been an improvement in EF to 55-60%. There is still severe aortic stenosis, however, gradients are undersampled during this study. There is probably mild mitral stenosis as well.  CHEST 2 VIEW  COMPARISON: July 08, 2012  FINDINGS:  There is no edema or consolidation. The heart size and pulmonary  vascularity are normal. There is atherosclerotic change in aorta. No  adenopathy. There are no appreciable bone lesions.  IMPRESSION:  No edema or consolidation. Atherosclerotic change in aorta.   CT CERVICAL SPINE FINDINGS  Osteopenia. No acute fracture. No dislocation. There is disc space  narrowing in C3-4, C4-5, C5-6, and C6-7 with posterior osteophytic  ridging. An element of spinal stenosis is suspected. There is also  some anterolisthesis of C2 upon C3 and C3 upon C4 that is  degenerative in nature. Ankylosis of the facet joints from C2  through C4 is present uncovertebral osteophytes encroach upon the  neural foramina at C4-5 and C5-6. No obvious spinal hematoma. No  obvious soft tissue injury. Thyroid gland is lobulated and  heterogeneous. Emphysema at the lung apices is noted.  IMPRESSION:  No acute intracranial pathology. Chronic ischemic changes.  No acute cervical spine injury. Chronic changes are noted  Discharge Exam: Blood pressure 137/68, pulse 82, temperature 98.2 F (36.8 C), temperature source Oral, resp. rate 16, height 5\' 3"  (1.6 m), weight 120 lb 6.4 oz (54.613 kg), SpO2 96.00%.    Disposition: 01-Home or Self Care     Medication List         acetaminophen 325 MG tablet  Commonly known as:  TYLENOL  Take 325 mg by mouth at bedtime.     atorvastatin 20 MG tablet  Commonly known as:  LIPITOR  Take 20 mg by mouth daily.     calcium citrate 950 MG tablet  Commonly known as:  CALCITRATE - dosed in mg elemental calcium  Take 1 tablet by mouth daily.      donepezil 10 MG tablet  Commonly known as:  ARICEPT  Take 1 tablet (10 mg total) by mouth daily.     multivitamin capsule  Take 1 capsule by mouth daily.     NIFEdipine 90 MG 24 hr tablet  Commonly known as:  PROCARDIA XL/ADALAT-CC  Take 90 mg by mouth daily.     warfarin 2.5 MG tablet  Commonly known as:  COUMADIN  Take 1.25-2.5 mg by mouth daily. 2.5 daily except take 1.25 mg on Monday and Thursday       Follow-up Information   Follow up with Peter [name], MD On 12/16/2013. (at 2:15 pm)    Specialty:  Cardiology   Contact information:   [address] [place] [address] [phone]        Discharge Instructions: We want you to wear a monitor, you should go by Dr. Elvis Coil new office  [address] and have placed today when you leave the hospital.  You will follow up with Dr. SwazilandJordan in 2 - 3 weeks to discuss symptoms and valve.  Heart healthy diet.     No driving.   Signed: Leone BrandINGOLD,LAURA R Nurse Practitioner-Certified [facility]: [facility] 12/04/2013, 1:28 PM  Time spent on discharge : 30 minutes.

## 2013-12-04 NOTE — Telephone Encounter (Signed)
Left VM on daughter's number that Vernona RiegerLaura, NP called our office to inform us that patient would be coming over for event monitor and she anticipates she will be able to come by our office before 5pm

## 2013-12-05 DIAGNOSIS — R55 Syncope and collapse: Secondary | ICD-10-CM

## 2013-12-05 DIAGNOSIS — R000 Tachycardia, unspecified: Secondary | ICD-10-CM | POA: Diagnosis not present

## 2013-12-07 ENCOUNTER — Telehealth: Payer: Self-pay | Admitting: Cardiology

## 2013-12-07 NOTE — Telephone Encounter (Signed)
Spoke with pt dtr, mary, questions regarding recent hosp stay answered.

## 2013-12-07 NOTE — Telephone Encounter (Signed)
Patient's daughter would like to know exactly why her mother was hospitalized.  They have been told several different things and are very confused.

## 2013-12-11 ENCOUNTER — Encounter (INDEPENDENT_AMBULATORY_CARE_PROVIDER_SITE_OTHER): Payer: Medicare Other | Admitting: Ophthalmology

## 2013-12-11 DIAGNOSIS — H3530 Unspecified macular degeneration: Secondary | ICD-10-CM | POA: Diagnosis not present

## 2013-12-11 DIAGNOSIS — I10 Essential (primary) hypertension: Secondary | ICD-10-CM | POA: Diagnosis not present

## 2013-12-11 DIAGNOSIS — H35039 Hypertensive retinopathy, unspecified eye: Secondary | ICD-10-CM | POA: Diagnosis not present

## 2013-12-11 DIAGNOSIS — H2510 Age-related nuclear cataract, unspecified eye: Secondary | ICD-10-CM

## 2013-12-11 DIAGNOSIS — H43819 Vitreous degeneration, unspecified eye: Secondary | ICD-10-CM

## 2013-12-11 DIAGNOSIS — H353290 Exudative age-related macular degeneration, unspecified eye, stage unspecified: Secondary | ICD-10-CM

## 2013-12-16 ENCOUNTER — Ambulatory Visit: Payer: Medicare Other

## 2013-12-16 ENCOUNTER — Ambulatory Visit (INDEPENDENT_AMBULATORY_CARE_PROVIDER_SITE_OTHER): Payer: Medicare Other | Admitting: Cardiology

## 2013-12-16 ENCOUNTER — Encounter: Payer: Self-pay | Admitting: Cardiology

## 2013-12-16 ENCOUNTER — Telehealth: Payer: Self-pay | Admitting: Cardiology

## 2013-12-16 VITALS — BP 138/70 | HR 73 | Ht 63.0 in | Wt 121.0 lb

## 2013-12-16 DIAGNOSIS — R55 Syncope and collapse: Secondary | ICD-10-CM

## 2013-12-16 DIAGNOSIS — I350 Nonrheumatic aortic (valve) stenosis: Secondary | ICD-10-CM

## 2013-12-16 DIAGNOSIS — I359 Nonrheumatic aortic valve disorder, unspecified: Secondary | ICD-10-CM | POA: Diagnosis not present

## 2013-12-16 DIAGNOSIS — I4891 Unspecified atrial fibrillation: Secondary | ICD-10-CM | POA: Diagnosis not present

## 2013-12-16 DIAGNOSIS — Z7901 Long term (current) use of anticoagulants: Secondary | ICD-10-CM

## 2013-12-16 DIAGNOSIS — I480 Paroxysmal atrial fibrillation: Secondary | ICD-10-CM

## 2013-12-16 NOTE — Patient Instructions (Addendum)
We will arrange consultation with CT surgery.  I will see you in 4 weeks.

## 2013-12-16 NOTE — Telephone Encounter (Signed)
Forgot to get her Coumadin checked,wants to know where can she get it drawn?

## 2013-12-17 ENCOUNTER — Telehealth: Payer: Self-pay

## 2013-12-17 ENCOUNTER — Telehealth: Payer: Self-pay | Admitting: *Deleted

## 2013-12-17 NOTE — Telephone Encounter (Signed)
Spoke to daughter, appt changed to Monday afternoon

## 2013-12-17 NOTE — Telephone Encounter (Signed)
[facility]  and pt states understanding

## 2013-12-17 NOTE — Progress Notes (Signed)
Rebecca [name] Date of Birth: 1925-12-07 Medical Record #[id number]  History of Present Illness: [name] is seen for followup today after recent hospitalization for syncope.  She has paroxysmal atrial fibrillation and severe aortic stenosis. She is  on Coumadin. She was recently admitted to the hospital on 12/02/13 after passing out at the breakfast table. She states she didn't feel well before hand. Family reports she was unresponsive. She regained consciousness after being placed on the floor after about 10 minutes.  She's had no palpitations. She denies any chest pain or shortness of breath.  Echocardiogram was repeated in there hospital. No clear arrhythmias noted. Since DC she has felt well. No further dizziness or syncope. She has been wearing an event monitor.  Current Outpatient Prescriptions on File Prior to Visit  Medication Sig Dispense Refill  . acetaminophen (TYLENOL) 325 MG tablet Take 325 mg by mouth at bedtime.      [name] atorvastatin (LIPITOR) 20 MG tablet Take 20 mg by mouth daily.      . calcium citrate (CALCITRATE - DOSED IN MG ELEMENTAL CALCIUM) 950 MG tablet Take 1 tablet by mouth daily.      [name] donepezil (ARICEPT) 10 MG tablet Take 1 tablet (10 mg total) by mouth daily.  30 tablet  12  . Multiple Vitamin (MULTIVITAMIN) capsule Take 1 capsule by mouth daily.      [name] NIFEdipine (PROCARDIA XL/ADALAT-CC) 90 MG 24 hr tablet Take 90 mg by mouth daily.      [name] warfarin (COUMADIN) 2.5 MG tablet Take 1.25-2.5 mg by mouth daily. 2.5 daily except take 1.25 mg on Monday and Thursday       No current facility-administered medications on file prior to visit.    No Known Allergies  Past Medical History  Diagnosis Date  . Hypertension   . High cholesterol   . UTI (lower urinary tract infection)   . Aortic stenosis   . Syncope 08/21/2012  . Paroxysmal atrial fibrillation   . Age-related macular degeneration, wet, right eye   . Heart murmur     "slight"  . Dementia      "slight"/daughter 12/02/2013    Past Surgical History  Procedure Laterality Date  . Tonsillectomy    . Cardiac catheterization  04/2013    History  Smoking status  . Former Smoker -- 0.40 packs/day for 30 years  . Types: Cigarettes  . Quit date: 04/30/1960  Smokeless tobacco  . Former User    History  Alcohol Use No    Family History  Problem Relation Age of Onset  . Heart attack Mother   . Heart Problems Father   . Cerebral aneurysm Sister     Review of Systems: The review of systems is positive for chronic memory loss. She is on Aricept.  All other systems were reviewed and are negative.  Physical Exam: BP 138/70  Pulse 73  Ht 5\' 3"  (1.6 m)  Wt 121 lb (54.885 kg)  BMI 21.44 kg/m2 She is a pleasant elderly white female in no acute distress. HEENT: Normocephalic, atraumatic. Pupils are equal round and reactive to light accommodation. Extraocular movements are full. Oropharynx is clear. There is good dental care. Neck is supple without JVD, adenopathy, or thyromegaly. Carotid upstrokes are reduced. Lungs: Clear Cardiovascular: Regular rate and rhythm. Normal S1 with diminished S2. There is a harsh grade 2-3/6 systolic murmur at the right upper sternal border. Abdomen: Soft and nontender. No masses or bruits. Bowel sounds positive. Extremities: Normal radial, femoral,  and pedal pulses. No cyanosis or edema. Bruising in the right groin without hematoma or bruit. Skin: Dry Neuro: Alert and oriented x3. Cranial nerves II through XII are intact.  LABORATORY DATA:  Lab Results  Component Value Date   WBC 7.7 12/03/2013   HGB 13.3 12/03/2013   HCT 40.8 12/03/2013   PLT 216 12/03/2013   GLUCOSE 105* 12/03/2013   ALT 19 12/02/2013   AST 22 12/02/2013   NA 142 12/03/2013   K 3.6* 12/03/2013   CL 103 12/03/2013   CREATININE 0.70 12/03/2013   BUN 15 12/03/2013   CO2 26 12/03/2013   TSH 1.400 12/02/2013   INR 2.28* 12/04/2013   Transthoracic Echocardiography  Patient: Rebecca, [name] MR #:  [id number] Study Date: 12/03/2013 Gender: F Age: 80 Height: 160 cm Weight: 54 kg BSA: 1.55 m^2 Pt. Status: Room: 3W23C  ADMITTING Marca Ancona, M.D. ATTENDING Marca Ancona, M.D. ORDERING Marca Ancona, M.D. REFERRING Marca Ancona, M.D. SONOGRAPHER [facility] PERFORMING [facility], Inpatient  cc:  ------------------------------------------------------------------- LV EF: 55% - 60%  ------------------------------------------------------------------- Indications: Aortic stenosis 424.1.  ------------------------------------------------------------------- History: PMH: Syncope. Atrial fibrillation. Risk factors: Dyslipidemia.  ------------------------------------------------------------------- Study Conclusions  - Left ventricle: The cavity size was normal. There was moderate concentric hypertrophy. Systolic function was normal. The estimated ejection fraction was in the range of 55% to 60%. Incoordinate septal motion. Doppler parameters are consistent with abnormal left ventricular relaxation (grade 1 diastolic dysfunction). The E/e&' ratio is >15, suggesting elevated LV filling pressure. - Aortic valve: The aortic valve is heavily calcified with restricted leaflet motion. Peak and mean gradients of 56 mmHg and 32 mmHg, respectively. Based on an LVOT diameter of 1.8 cm, the calculated AVA is 0.7-0.8 cm2. Consistent with severe aortic stenosis. The aortic valve gradient is probably undersampled. Valve area (VTI): 0.7 cm^2. Valve area (Vmax): 0.76 cm^2. - Mitral valve: Heavily calcified mitral annulus. There is mild mitral stenosis. Mild regurgitation. Valve area by continuity equation (using LVOT flow): 1.59 cm^2. - Left atrium: Moderately dilated (42 ml/m2).  Impressions:  - Compared to the prior echo in 04/2013, there has been an improvement in EF to 55-60%. There is still severe aortic stenosis, however, gradients are undersampled during this study. There is probably  mild mitral stenosis as well.  Transthoracic echocardiography. M-mode, complete 2D, spectral Doppler, and color Doppler. Birthdate: Patient birthdate: Mar 11, 1926. Age: Patient is 80 yr old. Sex: Gender: female. BMI: 21.1 kg/m^2. Blood pressure: 145/73 Patient status: Inpatient. Study date: Study date: 12/03/2013. Study time: 07:55 AM. Location: Echo laboratory.  -------------------------------------------------------------------  ------------------------------------------------------------------- Left ventricle: The cavity size was normal. There was moderate concentric hypertrophy. Systolic function was normal. The estimated ejection fraction was in the range of 55% to 60%. Incoordinate septal motion. Doppler parameters are consistent with abnormal left ventricular relaxation (grade 1 diastolic dysfunction). The E/e&' ratio is >15, suggesting elevated LV filling pressure.  ------------------------------------------------------------------- Aortic valve: The aortic valve is heavily calcified with restricted leaflet motion. Peak and mean gradients of 56 mmHg and 32 mmHg, respectively. Based on an LVOT diameter of 1.8 cm, the calculated AVA is 0.7-0.8 cm2. Consistent with severe aortic stenosis. The aortic valve gradient is probably undersampled. Doppler: Valve area (VTI): 0.7 cm^2. Indexed valve area (VTI): 0.45 cm^2/m^2. Valve area (Vmax): 0.76 cm^2. Indexed valve area (Vmax): 0.49 cm^2/m^2. Mean gradient (S): 32 mm Hg. Peak gradient (S): 56 mm Hg.  ------------------------------------------------------------------- Aorta: Aortic root: The aortic root was normal in size. Ascending aorta: The ascending aorta was normal in size.  -------------------------------------------------------------------  Mitral valve: Heavily calcified mitral annulus. There is mild mitral stenosis. Mild regurgitation. Doppler: Valve area by pressure half-time: 2.65 cm^2. Indexed valve area by  pressure half-time: 1.71 cm^2/m^2. Valve area by continuity equation (using LVOT flow): 1.59 cm^2. Indexed valve area by continuity equation (using LVOT flow): 1.03 cm^2/m^2. Mean gradient (D): 3 mm Hg. Peak gradient (D): 3 mm Hg.  ------------------------------------------------------------------- Left atrium: Moderately dilated (42 ml/m2).  ------------------------------------------------------------------- Atrial septum: No defect or patent foramen ovale was identified.  ------------------------------------------------------------------- Right ventricle: The cavity size was normal. Wall thickness was normal. Systolic function was normal.  ------------------------------------------------------------------- Pulmonic valve: The valve appears to be grossly normal. Doppler: There was no significant regurgitation.  ------------------------------------------------------------------- Tricuspid valve: Doppler: There was mild regurgitation.  ------------------------------------------------------------------- Pulmonary artery: The main pulmonary artery was normal-sized.  ------------------------------------------------------------------- Right atrium: The atrium was normal in size.  ------------------------------------------------------------------- Pericardium: There was no pericardial effusion.  ------------------------------------------------------------------- Systemic veins: Inferior vena cava: The vessel was normal in size. The respirophasic diameter changes were in the normal range (= 50%), consistent with normal central venous pressure.  ------------------------------------------------------------------- Measurements  Left ventricle Value 05/06/2013 Reference LV ID, ED, PLAX (L) 27.5 mm 36.6 43 - 52 chordal LV ID, ES, PLAX (L) 20.7 mm 23.6 23 - 38 chordal LV fx shortening, PLAX (L) 25 % 36 >=29 chordal LV PW thickness, ED 13.6 mm 13.2 --------- IVS/LV PW ratio, ED 0.9  1.25 <=1.3 Stroke volume, 2D 58 ml ---------- --------- Stroke volume/bsa, 2D 37 ml/m^2 ---------- --------- LV e&', lateral 5.21 cm/s 5.48 --------- LV E/e&', lateral 16.2 16.2 --------- LV e&', medial 3.99 cm/s 4.06 --------- LV E/e&', medial 21.15 21.87 --------- LV e&', average 4.6 cm/s ---------- --------- LV E/e&', average 18.35 ---------- ---------  Ventricular septum Value 05/06/2013 Reference IVS thickness, ED 12.3 mm 16.5 ---------  LVOT Value 05/06/2013 Reference LVOT ID, S 18 mm 19 --------- LVOT area 2.54 cm^2 2.84 ---------  Aortic valve Value 05/06/2013 Reference Aortic valve peak 374 cm/s 404 --------- velocity, S Aortic valve mean 265 cm/s 328 --------- velocity, S Aortic valve VTI, S 83.1 cm 100 --------- Aortic mean gradient, 32 mm Hg 47 --------- S Aortic peak gradient, 56 mm Hg 65 --------- S Aortic valve area, VTI 0.7 cm^2 0.74 --------- Aortic valve area/bsa, 0.45 cm^2/m^2 0.47 --------- VTI Aortic valve area, 0.76 cm^2 0.71 --------- peak velocity Aortic valve area/bsa, 0.49 cm^2/m^2 0.46 --------- peak velocity  Aorta Value 05/06/2013 Reference Aortic root ID, ED 30 mm 30 ---------  Left atrium Value 05/06/2013 Reference LA ID, A-P, ES 32 mm 36 --------- LA ID/bsa, A-P 2.06 cm/m^2 2.31 <=2.2  Mitral valve Value 05/06/2013 Reference Mitral E-wave peak 84.4 cm/s 88.8 --------- velocity Mitral A-wave peak 135 cm/s 151 --------- velocity Mitral mean velocity, 86.2 cm/s ---------- --------- D Mitral deceleration (H) 275 ms 363 150 - 230 time Mitral pressure 83 ms ---------- --------- half-time Mitral mean gradient, 3 mm Hg ---------- --------- D Mitral peak gradient, 3 mm Hg 3 --------- D Mitral E/A ratio, peak 0.6 0.6 --------- Mitral valve area, 2.65 cm^2 ---------- --------- PHT, DP Mitral valve area/bsa, 1.71 cm^2/m^2 ---------- --------- PHT, DP Mitral valve area, 1.59 cm^2 ---------- --------- LVOT continuity Mitral valve  area/bsa, 1.03 cm^2/m^2 ---------- --------- LVOT continuity Mitral annulus VTI, D 36.5 cm ---------- ---------  Pulmonary arteries Value 05/06/2013 Reference PA pressure, S, DP 27 mm Hg ---------- <=30  Tricuspid valve Value 05/06/2013 Reference Tricuspid regurg peak 247 cm/s 217 --------- velocity Tricuspid peak RV-RA 24 mm Hg 19 --------- gradient  Tricuspid maximal 247 cm/s ---------- --------- regurg velocity, PISA  Systemic veins Value 05/06/2013 Reference Estimated CVP 3 mm Hg 5 ---------  Right ventricle Value 05/06/2013 Reference RV pressure, S, DP 27 mm Hg 24 <=30 RV s&', lateral, S 15.9 cm/s 14.1 ---------  Legend: (L) and (H) mark values outside specified reference range.  ------------------------------------------------------------------- Prepared and Electronically Authenticated by  Zoila Shutter MD 2015-08-06T14:53:50  Cardiac Catheterization Procedure Note  Name: Reeda Soohoo  MRN: [id number]  DOB: 08/02/1925  Procedure: Right Heart Cath, Left Heart Cath, Selective Coronary Angiography, LV angiography  Indication: 80 yo WF with history of severe aortic stenosis. Recent Echo demonstrated decline in LV function.  Procedural Details: The right groin was prepped, draped, and anesthetized with 1% lidocaine. Using the modified Seldinger technique a 5 French sheath was placed in the right femoral artery and a 7 French sheath was placed in the right femoral vein. A Swan-Ganz catheter was used for the right heart catheterization. Standard protocol was followed for recording of right heart pressures and sampling of oxygen saturations. Fick cardiac output was calculated. Standard Judkins catheters were used for selective coronary angiography and left ventriculography. There were no immediate procedural complications. The patient was transferred to the post catheterization recovery area for further monitoring.  Procedural Findings:  Hemodynamics  RA 7/5 mean 4 mm Hg  RV  32/8 mm Hg  PA 24/11 mm Hg  PCWP 13/12 mean 10 mm Hg  LV 154/8 mm Hg  AO 115/442 mean 67 mm Hg.  AV mean gradient: 31 mm Hg, peak to peak gradient: 39 mm Hg. AV area: 0.9 cm2 with index 0.6  Oxygen saturations:  PA 71%  AO 93%  Cardiac Output (Fick) 4.82 L/min  Cardiac Index (Fick) 3.11 L/min/m2  Coronary angiography:  Coronary dominance: codominant  Left mainstem: Normal  Left anterior descending (LAD): Mild disease in the proximal vessel to 30%. The first diagonal is normal.  Left circumflex (LCx): Very large codominant vessel with multiple OM branches. Normal.  Right coronary artery (RCA): Normal.  Left ventriculography: Hand injection only. Left ventricular systolic function is normal, LVEF is estimated at 55-65%, there is no significant mitral regurgitation. The aortic valve and mitral annulus are heavily calcified  Final Conclusions:  1. Mild nonobstructive CAD  2. Normal LV function.  3. Normal right heart pressures  4. Severe Aortic stenosis.  Recommendations: Will review Echo data. LVEF appears much better by cath data.  Theron Arista [facility]  05/25/2013, 2:04 PM   Event monitor to date: NSR with PACs and occ. Blocked PAC. Nonsustained PSVT 7-10 beats.  Assessment / Plan: 1.  Aortic stenosis: Severe. On Cath LV function looked good. Right heart pressures were normal. Nonobstructive CAD. I reviewed Echo and I think EF was underestimated. There is some septal dyssynergy due to LBBB but overall function is normal. Given recent syncopal episode I think we need to proceed with consideration for AV replacement. Will refer to multidisciplinary valve clinic. She may be a candidate for TAVR.  2. Syncope. I suspect this was probably an arrhythmia in the setting of severe AS. Event monitor has demonstrated some nonsustained episodes of PSVT. I am reluctant to start beta blocker given some bradycardia noted in hospital and concern for post termination pause. Will continue to collect data  from event monitor.   3.  Paroxysmal atrial fibrillation. Now on Coumadin. No significant sustained high rates.  4. Hyperlipidemia.  5. Hypertension-controlled

## 2013-12-17 NOTE — Telephone Encounter (Signed)
Message copied by Jeannine KittenWALKER, SHARON G on Thu Dec 17, 2013  9:00 AM ------      Message from: Meda KlinefelterPUGH, CHERYL JOHNSON D      Created: Wed Dec 16, 2013  7:14 PM       Patient needs INR check.She left office today and we forgot to do a INR            Thanks Elnita Maxwellheryl ------

## 2013-12-17 NOTE — Telephone Encounter (Signed)
Returned call to patient 12/16/13 she stated she forgot to have INR done.Message sent to coumadin clinic for INR check.[facility]13 [facility]

## 2013-12-17 NOTE — Telephone Encounter (Signed)
Received call from patient's daughter Rebecca [name].She stated she brings her mother to her appointments and she want be able to bring her to INR appointment tomorrow 12/18/13.Stated if we would call her back to reschedule cell # [phone].[facility] res[facility].

## 2013-12-18 ENCOUNTER — Ambulatory Visit: Payer: Medicare Other | Admitting: Pharmacist Clinician (PhC)/ Clinical Pharmacy Specialist

## 2013-12-21 ENCOUNTER — Ambulatory Visit: Payer: Medicare Other | Admitting: Cardiology

## 2013-12-21 ENCOUNTER — Ambulatory Visit (INDEPENDENT_AMBULATORY_CARE_PROVIDER_SITE_OTHER): Payer: Medicare Other | Admitting: Pharmacist Clinician (PhC)/ Clinical Pharmacy Specialist

## 2013-12-21 DIAGNOSIS — Z5181 Encounter for therapeutic drug level monitoring: Secondary | ICD-10-CM

## 2013-12-21 DIAGNOSIS — I480 Paroxysmal atrial fibrillation: Secondary | ICD-10-CM

## 2013-12-21 DIAGNOSIS — Z7901 Long term (current) use of anticoagulants: Secondary | ICD-10-CM

## 2013-12-21 DIAGNOSIS — I4891 Unspecified atrial fibrillation: Secondary | ICD-10-CM

## 2013-12-21 LAB — POCT INR: INR: 3.4

## 2013-12-25 ENCOUNTER — Encounter: Payer: Medicare Other | Admitting: Thoracic Surgery (Cardiothoracic Vascular Surgery)

## 2013-12-25 ENCOUNTER — Encounter: Payer: Self-pay | Admitting: Thoracic Surgery (Cardiothoracic Vascular Surgery)

## 2013-12-25 ENCOUNTER — Institutional Professional Consult (permissible substitution) (INDEPENDENT_AMBULATORY_CARE_PROVIDER_SITE_OTHER): Payer: Medicare Other | Admitting: Thoracic Surgery (Cardiothoracic Vascular Surgery)

## 2013-12-25 VITALS — BP 129/76 | HR 86 | Resp 20 | Ht 63.0 in | Wt 121.0 lb

## 2013-12-25 DIAGNOSIS — I359 Nonrheumatic aortic valve disorder, unspecified: Secondary | ICD-10-CM | POA: Diagnosis not present

## 2013-12-25 DIAGNOSIS — I350 Nonrheumatic aortic (valve) stenosis: Secondary | ICD-10-CM

## 2013-12-25 NOTE — Patient Instructions (Signed)
Schedule office consultation with Dr Excell Seltzer once CT scans have been done

## 2013-12-25 NOTE — Progress Notes (Signed)
[facility]  MULTIDISCIPLINARY HEART VALVE CLINIC    CARDIOTHORACIC SURGERY CONSULTATION REPORT  Referring Provider is [name], Rebecca M, MD PCP is  Rebecca Lope, MD  Chief Complaint  Patient presents with  . Aortic Stenosis    Surgical eval for TAVR    HPI:  Patient is an 80 year old married white female from [place] with known history of severe aortic stenosis, hypertension, and atrial fibrillation who recently suffered a recurrent episode of syncope and has now been referred to consider possible surgical treatment options for management of severe, symptomatic aortic stenosis.  The patient first presented with aortic stenosis in March of 2014. At that time she suffered a near syncopal episode. She was hospitalized briefly and treated for a urinary tract infection.  Transthoracic echocardiogram performed at that time demonstrated what was felt to be moderate to severe aortic stenosis with mean transvalvular gradient across the aortic valve estimated to be 31 mm mercury. Left ventricular systolic function was normal.  She was referred to Dr. [name] who was first evaluated her in April of 2014. At that time the patient denied any exertional symptoms. Because of her advanced age and progressing memory loss, conservative management with close follow up was recommended.  A 30 day event monitor was placed and demonstrated that the patient was having frequent episodes of paroxysmal atrial fibrillation without bradycardia.  She was subsequently placed on long-term anticoagulation using warfarin.  She was seen by Dr. [name] in follow up in January of this year at which time a repeat echocardiogram was performed. This demonstrated the presence of severe aortic stenosis with peak velocity across the aortic valve measured greater than 4 m/s corresponding to estimated mean transvalvular gradient 47 mm mercury. Left ventricular function was felt to have decreased somewhat with ejection fraction  reported 40-45%.  Left and right heart catheterization was performed. Mean transvalvular gradient across the aortic valve at catheterization was measured 31 mm mercury corresponding to a calculated aortic valve area of 0.9 cm. The patient had mild nonobstructive coronary artery disease, normal LV function and normal pulmonary artery pressures.   At that time the patient continued to deny any symptoms of exertional shortness of breath, chest discomfort, or fatigue.  She was followed closely.    Early this month the patient suffered a prolonged syncopal episode. She had gotten up and reported feeling "funny" and "not quite right".  She sat down at the kitchen table to eat breakfast and passed out without warning.  Her husband saw this happen. She was found slumped over the kitchen table by her daughter.  She was unresponsive and breathing slowly. Her family put her down on the floor and she eventually regained full consciousness after about 10 minutes. After the event, she denied lightheadedness, palpitations or chest pain. She was brought to the ED by EMS where she reported feeling normal. EKG revealed NSR with chronic LBBB. BP was mildly elevated. She was admitted to the hospital and ruled out for myocardial infarction by enzymes.  An echocardiogram was performed revealing the presence of aortic stenosis with normal left ventricular systolic function. Mean gradient across the aortic valve actually measured slightly less (32 mmHg, peak velocity 3.7 m/s) than it did at the time of the echocardiogram performed last January.  A 30 day event monitor was placed and she was discharged home. Since hospital discharge she has felt well and she was seen in follow up by Dr. [name] last week.  Her event monitor has demonstrated some nonsustained  episodes of PSVT but no bradycardia.  She has been referred to discuss treatment options for management of severe aortic stenosis.  Patient is married and lives with her husband in  an apartment at her daughter's house.  She has previously remained relatively healthy and physically active all of her life. She was told to quit driving an automobile after her first syncopal episode. She has had some problems with progressive short-term memory loss and other behavioral issues consistent with dementia for which she has been followed by Dr. Marjory [name] for the past few years.  These issues have slowly progressed.  She denies any symptoms of exertional shortness of breath, chest tightness, or fatigue. She admits that she is not very active physically, but she states that she does everything that she wants to do. Her mobility is not impaired. She has not had PND, orthopnea, or lower extremity edema. She has not had palpitations in associated with her known recurrent paroxysmal atrial fibrillation. She has not had complications managing Coumadin therapy over the past year.   Past Medical History  Diagnosis Date  . Hypertension   . High cholesterol   . UTI (lower urinary tract infection)   . Aortic stenosis   . Syncope 08/21/2012  . Paroxysmal atrial fibrillation   . Age-related macular degeneration, wet, right eye   . Heart murmur     "slight"  . Dementia     "slight"/daughter 12/02/2013    Past Surgical History  Procedure Laterality Date  . Tonsillectomy    . Cardiac catheterization  04/2013    Family History  Problem Relation Age of Onset  . Heart attack Mother   . Heart Problems Father   . Cerebral aneurysm Sister     History   Social History  . Marital Status: Married    Spouse Name: Rebecca [name]    Number of Children: 3  . Years of Education: 11th   Occupational History  . Retired    Social History Main Topics  . Smoking status: Former Smoker -- 0.40 packs/day for 30 years    Types: Cigarettes    Quit date: 04/30/1960  . Smokeless tobacco: Former Neurosurgeon  . Alcohol Use: No  . Drug Use: No  . Sexual Activity: Not on file   Other Topics Concern  . Not on file     Social History Narrative   Pt lives at home with her daughter.   Married 65 yrs.   Caffeine Use: 2-3 cups daily    Current Outpatient Prescriptions  Medication Sig Dispense Refill  . atorvastatin (LIPITOR) 20 MG tablet Take 20 mg by mouth daily.      . calcium citrate (CALCITRATE - DOSED IN MG ELEMENTAL CALCIUM) 950 MG tablet Take 1 tablet by mouth daily.      [name] donepezil (ARICEPT) 10 MG tablet Take 1 tablet (10 mg total) by mouth daily.  30 tablet  12  . Multiple Vitamin (MULTIVITAMIN) capsule Take 1 capsule by mouth daily.      [name] NIFEdipine (PROCARDIA XL/ADALAT-CC) 90 MG 24 hr tablet Take 90 mg by mouth daily.      [name] warfarin (COUMADIN) 2.5 MG tablet Take 1.25-2.5 mg by mouth daily. 2.5 daily except take 1.25 mg on Monday and Thursday       No current facility-administered medications for this visit.    No Known Allergies    Review of Systems:   General:  normal appetite, normal energy, no weight gain, no weight loss, no fever  Cardiac:  no chest pain with exertion, no chest pain at rest, no SOB with exertion, no resting SOB, no PND, no orthopnea, no palpitations, + arrhythmia, + atrial fibrillation, no LE edema, + dizzy spells, + syncope  Respiratory:  no shortness of breath, no home oxygen, no productive cough, no dry cough, no bronchitis, no wheezing, no hemoptysis, no asthma, no pain with inspiration or cough, no sleep apnea, no CPAP at night  GI:   no difficulty swallowing, no reflux, no frequent heartburn, no hiatal hernia, no abdominal pain, no constipation, no diarrhea, no hematochezia, no hematemesis, no melena  GU:   no dysuria,  no frequency, no urinary tract infection, no hematuria, no kidney stones, no kidney disease  Vascular:  no pain suggestive of claudication, no pain in feet, no leg cramps, no varicose veins, no DVT, no non-healing foot ulcer  Neuro:   no stroke, no TIA's, no seizures, no headaches, no temporary blindness one eye,  no slurred speech, no  peripheral neuropathy, no chronic pain, no instability of gait, + significant memory/cognitive dysfunction  Musculoskeletal: no arthritis, no joint swelling, no myalgias, no difficulty walking, normal mobility   Skin:   no rash, no itching, no skin infections, no pressure sores or ulcerations  Psych:   no anxiety, no depression, no nervousness, no unusual recent stress  Eyes:   + blurry vision related to macular degeneration, no floaters, + recent vision changes, does not wear glasses or contacts  ENT:   no hearing loss, no loose or painful teeth, no dentures  Hematologic:  + easy bruising, no abnormal bleeding, no clotting disorder, no frequent epistaxis  Endocrine:  no diabetes, does not check CBG's at home           Physical Exam:   BP 129/76  Pulse 86  Resp 20  Ht 5\' 3"  (1.6 m)  Wt 121 lb (54.885 kg)  BMI 21.44 kg/m2  SpO2 97%  General:  Thin, elderly but o/w well-appearing  HEENT:  Unremarkable   Neck:   no JVD, no bruits, no adenopathy   Chest:   clear to auscultation, symmetrical breath sounds, no wheezes, no rhonchi   CV:   Irregular rate and rhythm, grade IV/VI crescendo/decrescendo murmur heard best at sternal border,  no diastolic murmur  Abdomen:  soft, non-tender, no masses   Extremities:  warm, well-perfused, pulses palpable in groin, no LE edema  Rectal/GU  Deferred  Neuro:   Grossly non-focal and symmetrical throughout  Skin:   Clean and dry, no rashes, no breakdown   Diagnostic Tests:  Transthoracic Echocardiography  Patient:    Rebecca [name], Rebecca [name] MR #:       [id number] Study Date: 05/06/2013 Gender:     F Age:        59 Height:     160cm Weight:     54.4kg BSA:        1.3m^2 Pt. Status: Room:    ATTENDING    [name], Rebecca  ORDERING     [name], Rebecca  REFERRING    [name], Rebecca  SONOGRAPHER  Aida Raider, RDCS  PERFORMING   [facility], Outpatient cc:  ------------------------------------------------------------ LV EF: 40% -    45%  ------------------------------------------------------------ Indications:      424.1 Aortic valve disorders.  ------------------------------------------------------------ History:   PMH:  Acquired from the patient and from the patient's chart.  PMH:  Syncope. Paroxysmal Atrial Fibrillation.  Risk factors:  Former tobacco use. Hypertension. Dyslipidemia.  ------------------------------------------------------------ Study Conclusions  - Left ventricle: The  cavity size was normal. Wall thickness   was increased in a pattern of moderate LVH. Systolic   function was mildly to moderately reduced. The estimated   ejection fraction was in the range of 40% to 45%. Diffuse   hypokinesis. There was an increased relative contribution   of atrial contraction to ventricular filling. Doppler   parameters are consistent with abnormal left ventricular   relaxation (grade 1 diastolic dysfunction). - Aortic valve: There was severe stenosis. Mild   regurgitation. - Left atrium: The atrium was mildly dilated. Transthoracic echocardiography.  M-mode, complete 2D, spectral Doppler, and color Doppler.  Height:  Height: 160cm. Height: 63in.  Weight:  Weight: 54.4kg. Weight: 119.8lb.  Body mass index:  BMI: 21.3kg/m^2.  Body surface area:    BSA: 1.96m^2.  Blood pressure:     160/69.  Patient status:  Outpatient.  Location:  [facility]  ------------------------------------------------------------  ------------------------------------------------------------ Left ventricle:  The cavity size was normal. Wall thickness was increased in a pattern of moderate LVH. Systolic function was mildly to moderately reduced. The estimated ejection fraction was in the range of 40% to 45%. Diffuse hypokinesis. There was an increased relative contribution of atrial contraction to ventricular filling. Early diastolic septal annular tissue Doppler velocities Ea were abnormal. Doppler parameters are  consistent with abnormal left ventricular relaxation (grade 1 diastolic dysfunction).  ------------------------------------------------------------ Aortic valve:   Moderately thickened, moderately calcified leaflets.  Doppler:   There was severe stenosis.    Mild regurgitation.    VTI ratio of LVOT to aortic valve: 0.26. Valve area: 0.74cm^2(VTI). Indexed valve area: 0.47cm^2/m^2 (VTI). Peak velocity ratio of LVOT to aortic valve: 0.25. Valve area: 0.71cm^2 (Vmax). Indexed valve area: 0.46cm^2/m^2 (Vmax).    Mean gradient: 47mm Hg (S). Peak gradient: 65mm Hg (S).  ------------------------------------------------------------ Aorta:  Aortic root: The aortic root was normal in size. Ascending aorta: The ascending aorta was normal in size.  ------------------------------------------------------------ Mitral valve:   Moderately thickened, moderately calcified leaflets .  Doppler:   Trivial regurgitation.    Peak gradient: 3mm Hg (D).  ------------------------------------------------------------ Left atrium:  The atrium was mildly dilated.  ------------------------------------------------------------ Right ventricle:  The cavity size was normal. Systolic function was normal.  ------------------------------------------------------------ Pulmonic valve:    Structurally normal valve.   Cusp separation was normal.  Doppler:  Transvalvular velocity was within the normal range.  No regurgitation.  ------------------------------------------------------------ Tricuspid valve:   Structurally normal valve.   Leaflet separation was normal.  Doppler:  Transvalvular velocity was within the normal range.  Trivial regurgitation.  ------------------------------------------------------------ Pulmonary artery:   Systolic pressure was within the normal range.  ------------------------------------------------------------ Right atrium:  The atrium was normal in  size.  ------------------------------------------------------------ Pericardium:  There was no pericardial effusion.  ------------------------------------------------------------ Systemic veins: Inferior vena cava: The vessel was normal in size; the respirophasic diameter changes were in the normal range (= 50%); findings are consistent with normal central venous pressure.  ------------------------------------------------------------  2D measurements        Normal  Doppler measurements   Normal Left ventricle                 Main pulmonary LVID ED,   36.6 mm     43-52   artery chord,                         Pressure,    24 mm Hg  =30 PLAX  S LVID ES,   23.6 mm     23-38   Left ventricle chord,                         Ea, lat    5.48 cm/s   ------ PLAX                           ann, tiss FS, chord,   36 %      >29     DP PLAX                           E/Ea, lat  16.2        ------ LVPW, ED   13.2 mm     ------  ann, tiss IVS/LVPW   1.25        <1.3    DP ratio, ED                      Ea, med    4.06 cm/s   ------ Ventricular septum             ann, tiss IVS, ED    16.5 mm     ------  DP LVOT                           E/Ea, med  21.8        ------ Diam, S      19 mm     ------  ann, tiss     7 Area       2.84 cm^2   ------  DP Diam         19 mm     ------  LVOT Aorta                          Peak vel,   101 cm/s   ------ Root diam,   30 mm     ------  S ED                             VTI, S     25.9 cm     ------ AAo AP       36 mm     ------  Stroke vol 73.4 ml     ------ diam, S                        Stroke     47.1 ml/m^2 ------ Left atrium                    index AP dim       36 mm     ------  Aortic valve AP dim     2.31 cm/m^2 <2.2    Peak vel,   404 cm/s   ------ index                          S                                Mean vel,   328 cm/s   ------  S                                VTI, S      100 cm      ------                                Mean         47 mm Hg  ------                                gradient,                                S                                Peak         65 mm Hg  ------                                gradient,                                S                                VTI ratio  0.26        ------                                LVOT/AV                                Area, VTI  0.74 cm^2   ------                                Area index 0.47 cm^2/m ------                                (VTI)           ^2                                Peak vel   0.25        ------                                ratio,                                LVOT/AV                                Area, Vmax 0.71  cm^2   ------                                Area index 0.46 cm^2/m ------                                (Vmax)          ^2                                Regurg PHT  482 ms     ------                                Mitral valve                                Peak E vel 88.8 cm/s   ------                                Peak A vel  151 cm/s   ------                                Decelerati  363 ms     150-23                                on time                0                                Peak          3 mm Hg  ------                                gradient,                                D                                Peak E/A    0.6        ------                                ratio                                Tricuspid valve                                Regurg      217 cm/s   ------  peak vel                                Peak RV-RA   19 mm Hg  ------                                gradient,                                S                                Systemic veins                                Estimated     5 mm Hg  ------                                CVP                                Right ventricle                                 Pressure,    24 mm Hg  <30                                S                                Sa vel,    14.1 cm/s   ------                                lat ann,                                tiss DP   ------------------------------------------------------------ Prepared and Electronically Authenticated by  Kristeen Miss [id number]        Transthoracic Echocardiography  Patient:    Rebecca [name], Rebecca [name] MR #:       [id number] Study Date: 12/03/2013 Gender:     F Age:        33 Height:     160 cm Weight:     54 kg BSA:        1.55 m^2 Pt. Status: Room:       [id number]   ADMITTING    Marca Ancona, M.D.  ATTENDING    Marca Ancona, M.D.  ORDERING     Marca Ancona, M.D.  REFERRING    Marca Ancona, M.D.  SONOGRAPHER  Arvil Chaco  PERFORMING   [facility], Inpatient  cc:  ------------------------------------------------------------------- LV EF: 55% -   60%  ------------------------------------------------------------------- Indications:      Aortic stenosis 424.1.  ------------------------------------------------------------------- History:   PMH:   Syncope.  Atrial fibrillation.  Risk factors: Dyslipidemia.  ------------------------------------------------------------------- Study  Conclusions  - Left ventricle: The cavity size was normal. There was moderate   concentric hypertrophy. Systolic function was normal. The   estimated ejection fraction was in the range of 55% to 60%.   Incoordinate septal motion. Doppler parameters are consistent   with abnormal left ventricular relaxation (grade 1 diastolic   dysfunction). The E/e&' ratio is >15, suggesting elevated LV   filling pressure. - Aortic valve: The aortic valve is heavily calcified with   restricted leaflet motion. Peak and mean gradients of 56 mmHg and   32 mmHg, respectively. Based on an LVOT diameter of 1.8 cm, the   calculated AVA is 0.7-0.8 cm2. Consistent with severe  aortic   stenosis. The aortic valve gradient is probably undersampled.   Valve area (VTI): 0.7 cm^2. Valve area (Vmax): 0.76 cm^2. - Mitral valve: Heavily calcified mitral annulus. There is mild   mitral stenosis. Mild regurgitation. Valve area by continuity   equation (using LVOT flow): 1.59 cm^2. - Left atrium: Moderately dilated (42 ml/m2).  Impressions:  - Compared to the prior echo in 04/2013, there has been an   improvement in EF to 55-60%. There is still severe aortic   stenosis, however, gradients are undersampled during this study.   There is probably mild mitral stenosis as well.  Transthoracic echocardiography.  M-mode, complete 2D, spectral Doppler, and color Doppler.  Birthdate:  Patient birthdate: 12-17-1925.  Age:  Patient is 80 yr old.  Sex:  Gender: female. BMI: 21.1 kg/m^2.  Blood pressure:     145/73  Patient status: Inpatient.  Study date:  Study date: 12/03/2013. Study time: 07:55 AM.  Location:  Echo laboratory.  -------------------------------------------------------------------  ------------------------------------------------------------------- Left ventricle:  The cavity size was normal. There was moderate concentric hypertrophy. Systolic function was normal. The estimated ejection fraction was in the range of 55% to 60%. Incoordinate septal motion. Doppler parameters are consistent with abnormal left ventricular relaxation (grade 1 diastolic dysfunction). The E/e&' ratio is >15, suggesting elevated LV filling pressure.  ------------------------------------------------------------------- Aortic valve:  The aortic valve is heavily calcified with restricted leaflet motion. Peak and mean gradients of 56 mmHg and 32 mmHg, respectively. Based on an LVOT diameter of 1.8 cm, the calculated AVA is 0.7-0.8 cm2. Consistent with severe aortic stenosis. The aortic valve gradient is probably undersampled. Doppler:     Valve area (VTI): 0.7 cm^2. Indexed valve area  (VTI): 0.45 cm^2/m^2. Valve area (Vmax): 0.76 cm^2. Indexed valve area (Vmax): 0.49 cm^2/m^2.    Mean gradient (S): 32 mm Hg. Peak gradient (S): 56 mm Hg.  ------------------------------------------------------------------- Aorta:  Aortic root: The aortic root was normal in size. Ascending aorta: The ascending aorta was normal in size.  ------------------------------------------------------------------- Mitral valve:  Heavily calcified mitral annulus. There is mild mitral stenosis. Mild regurgitation.  Doppler:     Valve area by pressure half-time: 2.65 cm^2. Indexed valve area by pressure half-time: 1.71 cm^2/m^2. Valve area by continuity equation (using LVOT flow): 1.59 cm^2. Indexed valve area by continuity equation (using LVOT flow): 1.03 cm^2/m^2.    Mean gradient (D): 3 mm Hg. Peak gradient (D): 3 mm Hg.  ------------------------------------------------------------------- Left atrium:  Moderately dilated (42 ml/m2).  ------------------------------------------------------------------- Atrial septum:  No defect or patent foramen ovale was identified.   ------------------------------------------------------------------- Right ventricle:  The cavity size was normal. Wall thickness was normal. Systolic function was normal.  ------------------------------------------------------------------- Pulmonic valve:    The valve appears to be grossly normal. Doppler:  There was no significant regurgitation.  ------------------------------------------------------------------- Tricuspid valve:  Doppler:  There was mild regurgitation.  ------------------------------------------------------------------- Pulmonary artery:   The main pulmonary artery was normal-sized.  ------------------------------------------------------------------- Right atrium:  The atrium was normal in size.  ------------------------------------------------------------------- Pericardium:  There was no pericardial  effusion.  ------------------------------------------------------------------- Systemic veins: Inferior vena cava: The vessel was normal in size. The respirophasic diameter changes were in the normal range (= 50%), consistent with normal central venous pressure.  ------------------------------------------------------------------- Measurements   Left ventricle                 Value          05/06/2013 Reference  LV ID, ED, PLAX        (L)     27.5  mm       36.6       43 - 52  chordal  LV ID, ES, PLAX        (L)     20.7  mm       23.6       23 - 38  chordal  LV fx shortening, PLAX (L)     25    %        36         >=29  chordal  LV PW thickness, ED            13.6  mm       13.2       ---------  IVS/LV PW ratio, ED            0.9            1.25       <=1.3  Stroke volume, 2D              58    ml       ---------- ---------  Stroke volume/bsa, 2D          37    ml/m^2   ---------- ---------  LV e&', lateral                 5.21  cm/s     5.48       ---------  LV E/e&', lateral               16.2           16.2       ---------  LV e&', medial                  3.99  cm/s     4.06       ---------  LV E/e&', medial                21.15          21.87      ---------  LV e&', average                 4.6   cm/s     ---------- ---------  LV E/e&', average               18.35          ---------- ---------    Ventricular septum             Value          05/06/2013 Reference  IVS thickness, ED              12.3  mm       16.5       ---------  LVOT                           Value          05/06/2013 Reference  LVOT ID, S                     18    mm       19         ---------  LVOT area                      2.54  cm^2     2.84       ---------    Aortic valve                   Value          05/06/2013 Reference  Aortic valve peak              374   cm/s     404        ---------  velocity, S  Aortic valve mean              265   cm/s     328        ---------  velocity, S  Aortic valve  VTI, S            83.1  cm       100        ---------  Aortic mean gradient,          32    mm Hg    47         ---------  S  Aortic peak gradient,          56    mm Hg    65         ---------  S  Aortic valve area, VTI         0.7   cm^2     0.74       ---------  Aortic valve area/bsa,         0.45  cm^2/m^2 0.47       ---------  VTI  Aortic valve area,             0.76  cm^2     0.71       ---------  peak velocity  Aortic valve area/bsa,         0.49  cm^2/m^2 0.46       ---------  peak velocity    Aorta                          Value          05/06/2013 Reference  Aortic root ID, ED             30    mm       30         ---------    Left atrium                    Value          05/06/2013 Reference  LA ID, A-P, ES                 32    mm       36         ---------  LA ID/bsa, A-P                 2.06  cm/m^2   2.31       <=2.2    Mitral valve                   Value          05/06/2013 Reference  Mitral E-wave peak             84.4  cm/s     88.8       ---------  velocity  Mitral A-wave peak             135   cm/s     151        ---------  velocity  Mitral mean velocity,          86.2  cm/s     ---------- ---------  D  Mitral deceleration    (H)     275   ms       363        150 - 230  time  Mitral pressure                83    ms       ---------- ---------  half-time  Mitral mean gradient,          3     mm Hg    ---------- ---------  D  Mitral peak gradient,          3     mm Hg    3          ---------  D  Mitral E/A ratio, peak         0.6            0.6        ---------  Mitral valve area,             2.65  cm^2     ---------- ---------  PHT, DP  Mitral valve area/bsa,         1.71  cm^2/m^2 ---------- ---------  PHT, DP  Mitral valve area,             1.59  cm^2     ---------- ---------  LVOT continuity  Mitral valve area/bsa,         1.03  cm^2/m^2 ---------- ---------  LVOT continuity  Mitral annulus VTI, D          36.5  cm       ---------- ---------     Pulmonary arteries             Value          05/06/2013 Reference  PA pressure, S, DP             27    mm Hg    ---------- <=30    Tricuspid valve                Value          05/06/2013 Reference  Tricuspid regurg peak          247   cm/s     217        ---------  velocity  Tricuspid peak RV-RA           24    mm Hg    19         ---------  gradient  Tricuspid maximal              247   cm/s     ---------- ---------  regurg velocity, PISA    Systemic veins                 Value          05/06/2013 Reference  Estimated CVP                  3     mm Hg    5          ---------    Right ventricle                Value          05/06/2013 Reference  RV pressure, S, DP             27    mm Hg    24         <=30  RV s&', lateral, S              15.9  cm/s     14.1       ---------  Legend: (L)  and  (H)  mark values outside specified reference range.  ------------------------------------------------------------------- Prepared and Electronically Authenticated by  Zoila Shutter MD 2015-08-06T14:53:50     Cardiac Catheterization Procedure Note  Name: Rebecca [name] MRN: [id number] DOB: May 06, 1925  Procedure: Right Heart Cath, Left Heart Cath, Selective Coronary Angiography, LV angiography  Indication: 80 yo WF with history of severe aortic stenosis. Recent Echo demonstrated decline in LV function.   Procedural Details: The right groin was prepped, draped, and anesthetized with 1% lidocaine. Using the modified Seldinger technique a 5 French sheath was placed in the right femoral artery and a 7 French sheath was placed in the right femoral vein. A Swan-Ganz catheter was used for the right heart catheterization. Standard protocol was followed for recording of right heart pressures and sampling of oxygen saturations. Fick cardiac output was calculated. Standard Judkins catheters were used for selective coronary angiography and left ventriculography. There were no immediate procedural  complications. The patient was transferred to the post catheterization recovery area for further monitoring.  Procedural Findings: Hemodynamics RA 7/5 mean 4 mm Hg RV 32/8 mm Hg PA 24/11 mm Hg PCWP 13/12 mean 10 mm Hg LV 154/8 mm Hg AO 115/442 mean 67 mm Hg.   AV mean gradient: 31 mm Hg, peak to peak gradient: 39 mm Hg. AV area: 0.9 cm2 with index 0.6  Oxygen saturations: PA 71% AO 93%  Cardiac Output (Fick) 4.82 L/min  Cardiac Index (Fick) 3.11 L/min/m2     Coronary angiography: Coronary dominance: codominant  Left mainstem: Normal  Left anterior descending (LAD): Mild disease in the proximal vessel to 30%. The first diagonal is normal.  Left circumflex (LCx): Very large codominant vessel with multiple OM branches. Normal.  Right coronary artery (RCA): Normal.  Left ventriculography: Hand injection only. Left ventricular systolic function is normal, LVEF is estimated at 55-65%, there is no significant mitral regurgitation. The aortic valve and mitral annulus are heavily calcified   Final Conclusions:   1. Mild nonobstructive CAD 2. Normal LV function. 3. Normal right heart pressures 4. Severe Aortic stenosis.  Recommendations: Will review Echo data. LVEF appears much better by cath data.    Theron Arista [facility] 05/25/2013, 2:04 PM      STS Risk Calculator  Procedure    AVR  Risk of Mortality   4.83% Morbidity or Mortality  20.4% Prolonged LOS   10.0% Short LOS    18.7% Permanent Stroke   2.4% Prolonged Vent Support  14.0% DSW Infection    0.2% Renal Failure    3.9% Reoperation    9.6%    Impression:  Patient has stage D severe symptomatic aortic stenosis with history of multiple syncopal episodes. Left ventricular function is only mildly reduced and diagnostic cardiac catheterization performed last January demonstrates only moderate nonobstructive coronary artery disease. The patient has had recurrent paroxysmal atrial fibrillation, but event monitors  have not demonstrated any episodes of bradycardia which might explain the patient's syncope. I have personally reviewed the patient's transthoracic echocardiograms from January and August of this year. The patient clearly has severe aortic stenosis with heavy calcification and severe leaflet restriction involving all 3 leaflets of the aortic valve.  Peak velocity across the aortic valve measured in excess of 4 m/s at the time of the echocardiogram performed last January. The most recent echocardiogram was notable for a very limited assessment of the transvalvular gradient.  Although the patient has otherwise remained reasonably active and relatively healthy for most of her life, she has developed progressive dementia over the last few years. Although her dementia is clearly not severe, it it has progressed to the point where it would unquestionably affect her ability to recover from surgery. Risks associated with conventional surgical aortic valve replacement would only be moderately elevated using traditional risk models such as the STS risk calculator. However, I feel that risks associated with conventional surgery would be much higher because of the patient's advanced age and underlying dementia.  Transcatheter aortic valve replacement might prove to be a reasonable alternative to high-risk conventional surgery.   Plan:  The patient, her husband and daughter were counseled at length regarding treatment alternatives for management of severe symptomatic aortic stenosis.  Alternative approaches such as conventional aortic valve replacement, transcatheter aortic valve replacement, and palliative medical therapy were compared and contrasted at length.  The risks associated with conventional surgical aortic valve replacement were been discussed in detail, as were expectations for post-operative convalescence. Long-term prognosis with medical therapy and the natural history of aortic stenosis were discussed. This  discussion was placed in the context of the patient's own specific clinical presentation and past medical history.  All of their questions been addressed.  The patient is interested in proceeding with further diagnostic testing to discover whether or not transcatheter aortic valve replacement might prove to be a reasonable treatment option. We will arrange for her to undergo cardiac gated CT angiogram of the heart, CT angiogram of the chest, abdomen, and pelvis, pulmonary function testing, and a physical therapy consultation. The patient will be for referred to Dr. Excell Seltzer for consultation once her CT scans have been completed. She will be followed in the multidisciplinary heart valve clinic to review the results of this testing and discuss treatment options further. We will additionally plan to follow up further results from the patient's ongoing 30 day event monitor when they become available.   I spent in excess of 90 minutes during the conduct of this office consultation and >50% of this time involved direct face-to-face encounter with the patient for counseling and/or coordination of their care.   Salvatore Decent. Cornelius Moras, MD 12/25/2013 3:23 PM

## 2013-12-27 ENCOUNTER — Other Ambulatory Visit: Payer: Self-pay | Admitting: Cardiology

## 2013-12-28 ENCOUNTER — Other Ambulatory Visit: Payer: Self-pay | Admitting: *Deleted

## 2013-12-28 DIAGNOSIS — I359 Nonrheumatic aortic valve disorder, unspecified: Secondary | ICD-10-CM

## 2014-01-06 ENCOUNTER — Ambulatory Visit (HOSPITAL_COMMUNITY): Payer: Medicare Other

## 2014-01-06 ENCOUNTER — Ambulatory Visit (HOSPITAL_COMMUNITY)
Admission: RE | Admit: 2014-01-06 | Discharge: 2014-01-06 | Disposition: A | Discharge status: Home / Self Care | Payer: Medicare Other | Source: Ambulatory Visit | Attending: Thoracic Surgery (Cardiothoracic Vascular Surgery) | Admitting: Thoracic Surgery (Cardiothoracic Vascular Surgery)

## 2014-01-06 VITALS — BP 138/62 | HR 63

## 2014-01-06 DIAGNOSIS — I359 Nonrheumatic aortic valve disorder, unspecified: Secondary | ICD-10-CM | POA: Diagnosis not present

## 2014-01-06 DIAGNOSIS — Q676 Pectus excavatum: Secondary | ICD-10-CM | POA: Diagnosis not present

## 2014-01-06 DIAGNOSIS — I080 Rheumatic disorders of both mitral and aortic valves: Secondary | ICD-10-CM | POA: Diagnosis not present

## 2014-01-06 DIAGNOSIS — E049 Nontoxic goiter, unspecified: Secondary | ICD-10-CM | POA: Diagnosis not present

## 2014-01-06 LAB — PULMONARY FUNCTION TEST
DL/VA % PRED: 95 %
DL/VA: 4.49 ml/min/mmHg/L
DLCO COR: 16.54 ml/min/mmHg
DLCO UNC: 16.54 ml/min/mmHg
DLCO cor % pred: 72 %
DLCO unc % pred: 72 %
FEF 25-75 PRE: 0.84 L/s
FEF 25-75 Post: 1.7 L/sec
FEF2575-%Change-Post: 102 %
FEF2575-%PRED-POST: 161 %
FEF2575-%Pred-Pre: 79 %
FEV1-%CHANGE-POST: 22 %
FEV1-%Pred-Post: 109 %
FEV1-%Pred-Pre: 89 %
FEV1-Post: 1.8 L
FEV1-Pre: 1.47 L
FEV1FVC-%Change-Post: 7 %
FEV1FVC-%PRED-PRE: 90 %
FEV6-%Change-Post: 14 %
FEV6-%PRED-POST: 120 %
FEV6-%PRED-PRE: 105 %
FEV6-POST: 2.53 L
FEV6-Pre: 2.2 L
FEV6FVC-%CHANGE-POST: 1 %
FEV6FVC-%PRED-POST: 106 %
FEV6FVC-%PRED-PRE: 105 %
FVC-%CHANGE-POST: 13 %
FVC-%PRED-PRE: 99 %
FVC-%Pred-Post: 113 %
FVC-POST: 2.53 L
FVC-Pre: 2.23 L
PRE FEV1/FVC RATIO: 66 %
Post FEV1/FVC ratio: 71 %
Post FEV6/FVC ratio: 100 %
Pre FEV6/FVC Ratio: 99 %
RV % pred: 132 %
RV: 3.26 L
TLC % pred: 108 %
TLC: 5.31 L

## 2014-01-06 MED ORDER — IOHEXOL 350 MG/ML SOLN
80.0000 mL | Freq: Once | INTRAVENOUS | Status: AC | PRN
  Administered 2014-01-06: 80 mL via INTRAVENOUS

## 2014-01-06 MED ORDER — ALBUTEROL SULFATE (2.5 MG/3ML) 0.083% IN NEBU
2.5000 mg | INHALATION_SOLUTION | Freq: Once | RESPIRATORY_TRACT | Status: AC
  Administered 2014-01-06: 2.5 mg via RESPIRATORY_TRACT

## 2014-01-06 MED ORDER — METOPROLOL TARTRATE 1 MG/ML IV SOLN
INTRAVENOUS | Status: AC
  Filled 2014-01-06: qty 5

## 2014-01-06 MED ORDER — METOPROLOL TARTRATE 1 MG/ML IV SOLN
5.0000 mg | INTRAVENOUS | Status: DC | PRN
  Administered 2014-01-06: 5 mg via INTRAVENOUS
  Filled 2014-01-06: qty 5

## 2014-01-06 NOTE — Progress Notes (Signed)
Escorted via w/c to resp department with family

## 2014-01-06 NOTE — Progress Notes (Signed)
L shin bumped on w/c, site with small cut and bleeding , area cleansed and 2x2 dsg applied

## 2014-01-07 ENCOUNTER — Ambulatory Visit (HOSPITAL_COMMUNITY): Payer: Medicare Other

## 2014-01-07 ENCOUNTER — Encounter (HOSPITAL_COMMUNITY): Payer: Medicare Other

## 2014-01-11 ENCOUNTER — Encounter: Payer: Self-pay | Admitting: Cardiology

## 2014-01-11 ENCOUNTER — Ambulatory Visit: Payer: Medicare Other | Admitting: Pharmacist Clinician (PhC)/ Clinical Pharmacy Specialist

## 2014-01-12 ENCOUNTER — Encounter: Payer: Self-pay | Admitting: Cardiovascular Disease

## 2014-01-12 ENCOUNTER — Ambulatory Visit (INDEPENDENT_AMBULATORY_CARE_PROVIDER_SITE_OTHER): Payer: Medicare Other | Admitting: Cardiovascular Disease

## 2014-01-12 VITALS — BP 140/72 | HR 78 | Ht 63.0 in | Wt 122.0 lb

## 2014-01-12 DIAGNOSIS — R55 Syncope and collapse: Secondary | ICD-10-CM

## 2014-01-12 DIAGNOSIS — I359 Nonrheumatic aortic valve disorder, unspecified: Secondary | ICD-10-CM | POA: Diagnosis not present

## 2014-01-12 NOTE — Patient Instructions (Signed)
You have been referred to Dr Laneta Simmers at [facility] for TAVR evaluation.  Your physician recommends that you continue on your current medications as directed. Please refer to the Current Medication list given to you today.

## 2014-01-12 NOTE — Progress Notes (Addendum)
HPI:   80 year old woman referred for evaluation of severe symptomatic aortic stenosis. The patient has been followed by Dr. [name]. In the spring of 2014 she was hospitalized for an episode of near syncope. At that time she was noted to have moderate to severe aortic stenosis, but was not considered a good surgical candidate because of her age and memory impairment. She did well until August of this year when she had an episode of frank syncope. She did not feel well when she woke up one morning, and while sitting at the table to be breakfast, she slumped over. This was a witnessed event and the patient was noted to be completely unresponsive with slow breathing. EMS was called and they recommended that the family put the patient on the floor. She slowly regained consciousness about 10 minutes later. She was hospitalized without any clear explanation of why this event may have occurred. She has no history of orthostasis. An echocardiogram showed progressive and severe aortic stenosis and she was referred for further discussion. She has been formally evaluated by Dr. Cornelius Moras with cardiac surgery. He felt that she is a poor candidate for conventional aortic valve replacement considering her advanced age and underlying dementia.  The patient continues to live independently with her husband. They have been married for 67 years. The patient is originally from [place], but has lived in [place] since 1989. Her daughter lives here and works at [employer]. The patient's only surgical history includes a remote tonsillectomy. She has never been hospitalized other than an observation stay for syncope. The patient has not driven a car in the last year because of her syncopal episodes and progressive memory decline. She otherwise has function at a high level. She continues to do some work in her garden and is active around the house. She specifically denies chest pain, shortness of breath, leg  swelling, palpitations, orthopnea, or PND.  The patient has undergone extensive evaluation, including right and left heart catheterization, CTA of the chest abdomen pelvis, and gated cardiac CTA.  Outpatient Encounter Prescriptions as of 01/12/2014  Medication Sig  . atorvastatin (LIPITOR) 20 MG tablet Take 20 mg by mouth daily.  . calcium citrate (CALCITRATE - DOSED IN MG ELEMENTAL CALCIUM) 950 MG tablet Take 1 tablet by mouth daily.  [name] donepezil (ARICEPT) 10 MG tablet Take 1 tablet (10 mg total) by mouth daily.  . Multiple Vitamin (MULTIVITAMIN) capsule Take 1 capsule by mouth daily.  [name] NIFEdipine (PROCARDIA XL/ADALAT-CC) 90 MG 24 hr tablet Take 90 mg by mouth daily.  [name] warfarin (COUMADIN) 2.5 MG tablet Take 1.25-2.5 mg by mouth daily. 2.5 daily except take 1.25 mg on Monday and Thursday  . warfarin (COUMADIN) 2.5 MG tablet Take 1 to 1.5 tablets by mouth daily as directed by coumadin clinic    Review of patient's allergies indicates no known allergies.  Past Medical History  Diagnosis Date  . Hypertension   . High cholesterol   . UTI (lower urinary tract infection)   . Aortic stenosis   . Syncope 08/21/2012  . Paroxysmal atrial fibrillation   . Age-related macular degeneration, wet, right eye   . Heart murmur     "slight"  . Dementia     "slight"/daughter 12/02/2013    Past Surgical History  Procedure Laterality Date  . Tonsillectomy    . Cardiac catheterization  04/2013    History   Social History  . Marital Status: Married    Spouse Name: Rebecca [name]  Number of Children: 3  . Years of Education: 11th   Occupational History  . Retired    Social History Main Topics  . Smoking status: Former Smoker -- 0.40 packs/day for 30 years    Types: Cigarettes    Quit date: 04/30/1960  . Smokeless tobacco: Former Neurosurgeon  . Alcohol Use: No  . Drug Use: No  . Sexual Activity: Not on file   Other Topics Concern  . Not on file   Social History Narrative   Pt lives at home with  her daughter.   Married 65 yrs.   Caffeine Use: 2-3 cups daily    Family History  Problem Relation Age of Onset  . Heart attack Mother   . Heart Problems Father   . Cerebral aneurysm Sister     ROS:  General: no fevers/chills/night sweats Eyes: Positive for vision loss ENT: no sore throat or hearing loss Resp: no cough, wheezing, or hemoptysis CV: no edema or palpitations GI: no abdominal pain, nausea, vomiting, diarrhea, or constipation GU: no dysuria, frequency, or hematuria Skin: no rash Neuro: no headache, numbness, tingling, or weakness of extremities. Positive for syncope, positive for cognitive impairment Musculoskeletal: no joint pain or swelling Heme: no bleeding, DVT, or easy bruising Endo: no polydipsia or polyuria  BP 140/72  Pulse 78  Ht  (1.6 m)  Wt 122 lb (55.339 kg)  BMI 21.62 kg/m2  PHYSICAL EXAM: Pt is alert and oriented, WD, WN, very pleasant elderly woman in no distress. HEENT: normal Neck: JVP normal. Carotid upstrokes markedly diminished and delayed  Lungs: equal expansion, clear bilaterally CV: Apex is discrete and nondisplaced, regular rate and rhythm with grade 3/6 harsh late peaking crescendo decrescendo murmur heard throughout  Abd: soft, NT, +BS, no bruit, no hepatosplenomegaly Back: no CVA tenderness Ext: no C/C/E        DP/PT pulses intact and = Skin: warm and dry without rash Neuro: CNII-XII intact             Strength intact = bilaterally  2D Echo 12/03/2013: Study Conclusions  - Left ventricle: The cavity size was normal. There was moderate concentric hypertrophy. Systolic function was normal. The estimated ejection fraction was in the range of 55% to 60%. Incoordinate septal motion. Doppler parameters are consistent with abnormal left ventricular relaxation (grade 1 diastolic dysfunction). The E/e&' ratio is >15, suggesting elevated LV filling pressure. - Aortic valve: The aortic valve is heavily calcified with restricted  leaflet motion. Peak and mean gradients of 56 mmHg and 32 mmHg, respectively. Based on an LVOT diameter of 1.8 cm, the calculated AVA is 0.7-0.8 cm2. Consistent with severe aortic stenosis. The aortic valve gradient is probably undersampled. Valve area (VTI): 0.7 cm^2. Valve area (Vmax): 0.76 cm^2. - Mitral valve: Heavily calcified mitral annulus. There is mild mitral stenosis. Mild regurgitation. Valve area by continuity equation (using LVOT flow): 1.59 cm^2. - Left atrium: Moderately dilated (42 ml/m2).  Impressions:  - Compared to the prior echo in 04/2013, there has been an improvement in EF to 55-60%. There is still severe aortic stenosis, however, gradients are undersampled during this study. There is probably mild mitral stenosis as well.  CTA Abdomen/Pelvis: VASCULAR MEASUREMENTS PERTINENT TO TAVR:  AORTA:  Minimal Aortic Diameter - 10 x 9 mm  Severity of Aortic Calcification - Severe and often times  circumferential  RIGHT PELVIS:  Right Common Iliac Artery -  Minimal Diameter - 6.3 x 8.4 mm  Tortuosity - Moderate  Calcification -  Moderate  Right External Iliac Artery -  Minimal Diameter - 6.1 x 6.6 mm  Tortuosity - Mild  Calcification - None  Right Common Femoral Artery -  Minimal Diameter - 6.0 x 7.2 mm  Tortuosity - Mild  Calcification - Mild to moderate  LEFT PELVIS:  Left Common Iliac Artery -  Minimal Diameter - 6.6 x 6.1 mm  Tortuosity - Moderate  Calcification - Moderate  Left External Iliac Artery -  Minimal Diameter - 7.0 x 6.6 mm  Tortuosity - Mild  Calcification - None  Left Common Femoral Artery -  Minimal Diameter - 5.6 x 6.5 mm  Tortuosity - Mild  Calcification - Mild  Review of the MIP images confirms the above findings.  IMPRESSION:  1. Vascular findings and measurements pertinent to potential TAVR  procedure, as above. This patient does appear to have suitable  pelvic arterial access.  2. 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 In the lower pole moiety of the left renal  collecting system and proximal lower pole ureter there is  circumferential urothelial thickening, concerning for potential  upper tract urothelial neoplasm. Urologic consultation is  recommended for further evaluation.  3. Large teratoma in the pelvis redemonstrated.  4. New side branch ectasia in the head and proximal body of the  pancreas. This is nonspecific, and could relate to prior episodes of  pancreatitis. However, the possibility of a slow-growing neoplasm  such is intraductal papillary mucinous neoplasm (IPMN) is not  excluded. Followup evaluation with MRI of the abdomen with and  without IV gadolinium and MRCP in 6 months is recommended to ensure  the stability of these findings.  5. Additional incidental findings, as above.  These results will be called to the ordering clinician or  representative by the Radiologist Assistant, and communication  documented in the PACS or zVision Dashboard.  Cardiac CTA; FINDINGS:  Aortic Valve: Trileaflet,  Aorta: Normal size aortic root and thoracic aorta. Mild  calcifications around the origin of brachiocephalic arteries. No  dissection.  Sinotubular Junction: 29 x 27 mm  Ascending Thoracic Aorta: 37 x 36 mm  Aortic Arch: 30 x 30 mm  Descending Thoracic Aorta: 24 x 24 mm  Sinus of Valsalva Measurements:  Non-coronary: 28 mm  Right -coronary: 28 mm  Left -coronary: 30 mm  Coronary Artery Height above Annulus:  Left Main: 9 mm  Right Coronary: 9 mm  Virtual Basal Annulus Measurements:  Maximum/Minimum Diameter: 24 x 22 mm  Perimeter: 76 mm  Area: 403 mm2  Coronary Arteries: Originating in normal position. The study wasn't  designed for coronary arteries evaluation (no use of nitroglycerin).  Optimum Fluoroscopic Angle for Delivery: LAO 7 CAU 7  IMPRESSION:  1. Mildly calcified trileaflet aortic valve with limited leaflet  opening and annular size  suitable for delivery of 23 mm TAVR  Edward-Sapien XT valve.  2. Sufficient coronaries to annular distance.  3. Optimum fluoroscopic angle for delivery LAO 7 CAU 7.  4. Sufficient sheath to tip distance (67 mm).   Cardiac Cath 05/25/2013: Procedural Findings:  Hemodynamics  RA 7/5 mean 4 mm Hg  RV 32/8 mm Hg  PA 24/11 mm Hg  PCWP 13/12 mean 10 mm Hg  LV 154/8 mm Hg  AO 115/442 mean 67 mm Hg.  AV mean gradient: 31 mm Hg, peak to peak gradient: 39 mm Hg. AV area: 0.9 cm2 with index 0.6  Oxygen saturations:  PA 71%  AO 93%  Cardiac Output (Fick) 4.82 L/min  Cardiac Index (Fick) 3.11 L/min/m2  Coronary angiography:  Coronary dominance: codominant  Left mainstem: Normal  Left anterior descending (LAD): Mild disease in the proximal vessel to 30%. The first diagonal is normal.  Left circumflex (LCx): Very large codominant vessel with multiple OM branches. Normal.  Right coronary artery (RCA): Normal.  Left ventriculography: Hand injection only. Left ventricular systolic function is normal, LVEF is estimated at 55-65%, there is no significant mitral regurgitation. The aortic valve and mitral annulus are heavily calcified  Final Conclusions:  1. Mild nonobstructive CAD  2. Normal LV function.  3. Normal right heart pressures  4. Severe Aortic stenosis.  STS Risk Calculator  Procedure AVR  Risk of Mortality 4.83%  Morbidity or Mortality 20.4%  Prolonged LOS 10.0%  Short LOS 18.7%  Permanent Stroke 2.4%  Prolonged Vent Support 14.0%  DSW Infection 0.2%  Renal Failure 3.9%  Reoperation 9.6%  ASSESSMENT AND PLAN: 80 year old woman with severe symptomatic aortic stenosis, manifest with recurrent syncope. I have personally reviewed her echo and cardiac cath images. Also have reviewed her cardiac CTA and CTA of the chest/abdomen/pelvis. She is here today with her husband and daughter. We have discussed the natural history of severe symptomatic aortic stenosis. We have contrasted  potential treatment options, including palliative medical therapy, TAVR, and surgical aortic valve replacement. Her true risk of cardiac surgery his underestimated by the STS risk calculator because of underlying mild to moderate dementia. Other comorbid conditions include advanced age and atrial fibrillation. TAVR appears to be a reasonable treatment alternative for this patient. I have reviewed the risks, indications, and alternatives to transcatheter aortic valve replacement. Her valve annulus does appear to be amenable to the currently available Sapien XT valve (23 mm). Pelvic access also appears adequate for a transfemoral approach.   The patient was noted to have a radiographic abnormality of the pancreas and the differential diagnosis includes scarring from previous pancreatitis versus a slow-growing angry affect neoplasm. Six-month followup imaging has been recommended. This has been discussed with the patient and family today. Regardless of this finding, the patient's severe aortic stenosis would require treatment prior to any further therapy directed at her pancreatic abnormality. The patient and her family have been counseled at length and they agree to proceed with TAVR via a planned transfemoral route at the next available time. She will also require a formal second opinion from cardiac surgery and this will be arranged with Dr. Laneta Simmers.  Following the decision to proceed with transcatheter aortic valve replacement, a discussion has been held regarding what types of management strategies would be attempted intraoperatively in the event of life-threatening complications, including whether or not the patient would be considered a candidate for the use of cardiopulmonary bypass and/or conversion to open sternotomy for attempted surgical intervention.  The patient has been advised of a variety of complications that might develop including but not limited to risks of death, stroke, paravalvular leak,  aortic dissection or other major vascular complications, aortic annulus rupture, device embolization, cardiac rupture or perforation, mitral regurgitation, acute myocardial infarction, arrhythmia, heart block or bradycardia requiring permanent pacemaker placement, congestive heart failure, respiratory failure, renal failure, pneumonia, infection, other late complications related to structural valve deterioration or migration, or other complications that might ultimately cause a temporary or permanent loss of functional independence or other long term morbidity.  The patient provides full informed consent for the procedure as described and all questions were answered.  Casimiro Needle  Aijalon Demuro MD 01/12/2014 2:44 PM   ADDENDUM 01/14/14: Spoke with Dr Llana Aliment to review CTA result/recommendations. Will place recall for MRI/MRCP to follow-up pancreatic abnormality in 6 months. Will refer to urology for evaluation of left urothelial thickening and concern for upper tract neoplasm. Patient requests Dr Retta Diones who sees her husband.   Tonny Bollman MD 01/14/2014 10:43 AM

## 2014-01-13 ENCOUNTER — Encounter (INDEPENDENT_AMBULATORY_CARE_PROVIDER_SITE_OTHER): Payer: Medicare Other | Admitting: Ophthalmology

## 2014-01-13 ENCOUNTER — Telehealth: Payer: Self-pay | Admitting: Cardiovascular Disease

## 2014-01-13 DIAGNOSIS — R9341 Abnormal radiologic findings on diagnostic imaging of renal pelvis, ureter, or bladder: Secondary | ICD-10-CM

## 2014-01-13 NOTE — Telephone Encounter (Signed)
I spoke with Corrie Dandy and she would like to know if Dr Excell Seltzer has spoken with the Radiologist about the abnormality on pt's pancreas. Corrie Dandy would like a phone call to discuss these findings after Dr Excell Seltzer touches base with Radiology.  I will forward this message to Dr Excell Seltzer.

## 2014-01-13 NOTE — Telephone Encounter (Signed)
Will do. I called and left message with Dr Llana Aliment yesterday. Waiting to hear back. thx

## 2014-01-13 NOTE — Telephone Encounter (Signed)
New message    Daughter calling seen in the office on yesterday .  Has some questions what's was seen on the pancreas.

## 2014-01-14 ENCOUNTER — Ambulatory Visit: Payer: Medicare Other | Attending: Thoracic Surgery (Cardiothoracic Vascular Surgery) | Admitting: Physical Therapy

## 2014-01-14 DIAGNOSIS — M6281 Muscle weakness (generalized): Secondary | ICD-10-CM | POA: Insufficient documentation

## 2014-01-14 DIAGNOSIS — IMO0001 Reserved for inherently not codable concepts without codable children: Secondary | ICD-10-CM | POA: Insufficient documentation

## 2014-01-14 NOTE — Telephone Encounter (Signed)
Pt scheduled to see Dr Retta Diones on 02/19/14 at 10:45.

## 2014-01-14 NOTE — Telephone Encounter (Signed)
Order placed for Urology referral.

## 2014-01-14 NOTE — Telephone Encounter (Signed)
Called and left message on Rebecca [name]'s phone. She will call back.

## 2014-01-15 ENCOUNTER — Encounter (INDEPENDENT_AMBULATORY_CARE_PROVIDER_SITE_OTHER): Payer: Medicare Other | Admitting: Ophthalmology

## 2014-01-19 ENCOUNTER — Ambulatory Visit (INDEPENDENT_AMBULATORY_CARE_PROVIDER_SITE_OTHER): Payer: Medicare Other | Admitting: *Deleted

## 2014-01-19 ENCOUNTER — Ambulatory Visit (INDEPENDENT_AMBULATORY_CARE_PROVIDER_SITE_OTHER): Payer: Medicare Other | Admitting: Cardiology

## 2014-01-19 ENCOUNTER — Encounter: Payer: Self-pay | Admitting: Cardiology

## 2014-01-19 VITALS — BP 132/76 | HR 72 | Ht 63.0 in | Wt 122.8 lb

## 2014-01-19 DIAGNOSIS — Z7901 Long term (current) use of anticoagulants: Secondary | ICD-10-CM

## 2014-01-19 DIAGNOSIS — R55 Syncope and collapse: Secondary | ICD-10-CM | POA: Diagnosis not present

## 2014-01-19 DIAGNOSIS — I10 Essential (primary) hypertension: Secondary | ICD-10-CM

## 2014-01-19 DIAGNOSIS — I4891 Unspecified atrial fibrillation: Secondary | ICD-10-CM

## 2014-01-19 DIAGNOSIS — I359 Nonrheumatic aortic valve disorder, unspecified: Secondary | ICD-10-CM | POA: Diagnosis not present

## 2014-01-19 DIAGNOSIS — I480 Paroxysmal atrial fibrillation: Secondary | ICD-10-CM

## 2014-01-19 DIAGNOSIS — I350 Nonrheumatic aortic (valve) stenosis: Secondary | ICD-10-CM

## 2014-01-19 DIAGNOSIS — Z5181 Encounter for therapeutic drug level monitoring: Secondary | ICD-10-CM

## 2014-01-19 LAB — POCT INR: INR: 2.7

## 2014-01-19 NOTE — Progress Notes (Signed)
Rebecca [name] Date of Birth: 10-21-1925 Medical Record #[id number]  History of Present Illness: Rebecca [name] is seen for followup today. She has paroxysmal atrial fibrillation and severe aortic stenosis. She is  on Coumadin. She was recently admitted to the hospital on 12/02/13 with syncope.  She's had no palpitations. She denies any chest pain or shortness of breath.  Overall she is feeling well. She has seen Dr. Cornelius Moras and Dr. Excell Seltzer for evaluation of TAVR for her severe AS. She is scheduled to see Dr. Laneta Simmers on September 30 for a second surgical opinion. She is also scheduled to see Dr. Retta Diones for evaluation of abnormal finding on ureter on CT. Family present today and very supportive.   Current Outpatient Prescriptions on File Prior to Visit  Medication Sig Dispense Refill  . atorvastatin (LIPITOR) 20 MG tablet Take 20 mg by mouth daily.      . calcium citrate (CALCITRATE - DOSED IN MG ELEMENTAL CALCIUM) 950 MG tablet Take 1 tablet by mouth daily.      [name] donepezil (ARICEPT) 10 MG tablet Take 1 tablet (10 mg total) by mouth daily.  30 tablet  12  . Multiple Vitamin (MULTIVITAMIN) capsule Take 1 capsule by mouth daily.      [name] NIFEdipine (PROCARDIA XL/ADALAT-CC) 90 MG 24 hr tablet Take 90 mg by mouth daily.      [name] warfarin (COUMADIN) 2.5 MG tablet Take 1 to 1.5 tablets by mouth daily as directed by coumadin clinic  40 tablet  3   No current facility-administered medications on file prior to visit.    No Known Allergies  Past Medical History  Diagnosis Date  . Hypertension   . High cholesterol   . UTI (lower urinary tract infection)   . Aortic stenosis   . Syncope 08/21/2012  . Paroxysmal atrial fibrillation   . Age-related macular degeneration, wet, right eye   . Heart murmur     "slight"  . Dementia     "slight"/daughter 12/02/2013    Past Surgical History  Procedure Laterality Date  . Tonsillectomy    . Cardiac catheterization  04/2013    History  Smoking status  . Former  Smoker -- 0.40 packs/day for 30 years  . Types: Cigarettes  . Quit date: 04/30/1960  Smokeless tobacco  . Former User    History  Alcohol Use No    Family History  Problem Relation Age of Onset  . Heart attack Mother   . Heart Problems Father   . Cerebral aneurysm Sister     Review of Systems: The review of systems is positive for chronic memory loss. She is on Aricept.  All other systems were reviewed and are negative.  Physical Exam: BP 132/76  Pulse 72  Ht  (1.6 m)  Wt 122 lb 12.8 oz (55.702 kg)  BMI 21.76 kg/m2 She is a pleasant elderly white female in no acute distress. HEENT: Normocephalic, atraumatic. Pupils are equal round and reactive to light accommodation. Extraocular movements are full. Oropharynx is clear. There is good dental care. Neck is supple without JVD, adenopathy, or thyromegaly. Carotid upstrokes are reduced. Lungs: Clear Cardiovascular: Regular rate and rhythm. Normal S1 with diminished S2. There is a harsh grade 2-3/6 systolic murmur at the right upper sternal border. Abdomen: Soft and nontender. No masses or bruits. Bowel sounds positive. Extremities: Normal radial, femoral, and pedal pulses. No cyanosis or edema. Bruising in the right groin without hematoma or bruit. Skin: Dry Neuro: Alert and oriented x3.  Cranial nerves II through XII are intact.  LABORATORY DATA:  Lab Results  Component Value Date   WBC 7.7 12/03/2013   HGB 13.3 12/03/2013   HCT 40.8 12/03/2013   PLT 216 12/03/2013   GLUCOSE 105* 12/03/2013   ALT 19 12/02/2013   AST 22 12/02/2013   NA 142 12/03/2013   K 3.6* 12/03/2013   CL 103 12/03/2013   CREATININE 0.70 12/03/2013   BUN 15 12/03/2013   CO2 26 12/03/2013   TSH 1.400 12/02/2013   INR 2.7 01/19/2014    Assessment / Plan: 1.  Aortic stenosis: Severe. On Cath LV function looked good. Right heart pressures were normal. Nonobstructive CAD. There is some septal dyssynergy due to LBBB but overall function is normal. Given recent syncopal  episode I think we need to proceed with consideration for AV replacement. It appears that she will be a candidate for TAVR but complete work up in progress.   2. Syncope. Probably related to severe AS. Event monitor has demonstrated some nonsustained episodes of PSVT. I am reluctant to start beta blocker given some bradycardia noted in hospital and concern for post termination pause.   3.  Paroxysmal atrial fibrillation. Now on Coumadin. No significant sustained high rates. INR therapeutic at 2.7.  4. Hyperlipidemia.  5. Hypertension-controlled

## 2014-01-21 ENCOUNTER — Encounter: Payer: Self-pay | Admitting: Cardiovascular Disease

## 2014-01-21 NOTE — Progress Notes (Signed)
Reviewed patient's CT findings with Dr Vernie Ammons. He agrees best plan is to proceed with TAVR to treat this patient's severe AS, then work up potential urologic malignancy after she recovers. She will likely require ureteroscopy which is done under general anesthesia. Discussed plan with the patient's daughter. Will schedule for TAVR at next available time as previously planned.   Rebecca [name] 01/21/2014 4:42 PM

## 2014-01-22 NOTE — Progress Notes (Signed)
Done.  Seeing Bartle 01/27/14.

## 2014-01-25 ENCOUNTER — Encounter (INDEPENDENT_AMBULATORY_CARE_PROVIDER_SITE_OTHER): Payer: Medicare Other | Admitting: Ophthalmology

## 2014-01-25 ENCOUNTER — Other Ambulatory Visit: Payer: Self-pay

## 2014-01-25 DIAGNOSIS — H2510 Age-related nuclear cataract, unspecified eye: Secondary | ICD-10-CM

## 2014-01-25 DIAGNOSIS — H43819 Vitreous degeneration, unspecified eye: Secondary | ICD-10-CM

## 2014-01-25 DIAGNOSIS — H35039 Hypertensive retinopathy, unspecified eye: Secondary | ICD-10-CM | POA: Diagnosis not present

## 2014-01-25 DIAGNOSIS — I359 Nonrheumatic aortic valve disorder, unspecified: Secondary | ICD-10-CM

## 2014-01-25 DIAGNOSIS — I10 Essential (primary) hypertension: Secondary | ICD-10-CM | POA: Diagnosis not present

## 2014-01-25 DIAGNOSIS — H3530 Unspecified macular degeneration: Secondary | ICD-10-CM | POA: Diagnosis not present

## 2014-01-25 DIAGNOSIS — H353290 Exudative age-related macular degeneration, unspecified eye, stage unspecified: Secondary | ICD-10-CM | POA: Diagnosis not present

## 2014-01-27 ENCOUNTER — Encounter (HOSPITAL_COMMUNITY): Payer: Self-pay

## 2014-01-27 ENCOUNTER — Institutional Professional Consult (permissible substitution) (INDEPENDENT_AMBULATORY_CARE_PROVIDER_SITE_OTHER): Payer: Medicare Other | Admitting: Surgery

## 2014-01-27 ENCOUNTER — Encounter: Payer: Self-pay | Admitting: Surgery

## 2014-01-27 ENCOUNTER — Encounter: Payer: Medicare Other | Admitting: Surgery

## 2014-01-27 VITALS — BP 137/82 | HR 77 | Ht 63.0 in | Wt 122.0 lb

## 2014-01-27 DIAGNOSIS — I359 Nonrheumatic aortic valve disorder, unspecified: Secondary | ICD-10-CM | POA: Diagnosis not present

## 2014-01-27 DIAGNOSIS — I350 Nonrheumatic aortic (valve) stenosis: Secondary | ICD-10-CM

## 2014-01-27 DIAGNOSIS — Z23 Encounter for immunization: Secondary | ICD-10-CM | POA: Diagnosis not present

## 2014-01-28 NOTE — Pre-Procedure Instructions (Addendum)
Rebecca RoyaltyJennie [name]  01/28/2014   Your procedure is scheduled on:  Tuesday, October 6th  [facility] Admitting at 900 AM.  Call this number if you have problems the morning of surgery: [phone]   Remember:   Do not eat food or drink liquids after midnight.   Take these medicines the morning of surgery with A SIP OF WATER:  Procardia Stop taking Nsaids 7 days prior to surgery. Coumadin was stopped January 28, 2014 per Dr Laneta SimmersBartle.   Do not wear jewelry, make-up or nail polish.  Do not wear lotions, powders, or perfumes. You may wear deodorant.  Do not shave 48 hours prior to surgery. Men may shave face and neck.  Do not bring valuables to the hospital.  [facility] is not responsible for any belongings or valuables.               Contacts, dentures or bridgework may not be worn into surgery.  Leave suitcase in the car. After surgery it may be brought to your room.  For patients admitted to the hospital, discharge time is determined by your treatment team.             Please read over the following fact sheets that you were given: Pain Booklet, Coughing and Deep Breathing, Blood Transfusion Information, MRSA Information and Surgical Site Infection Prevention [facility] - Preparing for Surgery  Before surgery, you can play an important role.  Because skin is not sterile, your skin needs to be as free of germs as possible.  You can reduce the number of germs on you skin by washing with CHG (chlorahexidine gluconate) soap before surgery.  CHG is an antiseptic cleaner which kills germs and bonds with the skin to continue killing germs even after washing.  Please DO NOT use if you have an allergy to CHG or antibacterial soaps.  If your skin becomes reddened/irritated stop using the CHG and inform your nurse when you arrive at Short Stay.  Do not shave (including legs and underarms) for at least 48 hours prior to the first CHG shower.  You may shave your face.  Please follow  these instructions carefully:   1.  Shower with CHG Soap the night before surgery and the morning of Surgery.  2.  If you choose to wash your hair, wash your hair first as usual with your normal shampoo.  3.  After you shampoo, rinse your hair and body thoroughly to remove the shampoo.  4.  Use CHG as you would any other liquid soap.  You can apply CHG directly to the skin and wash gently with scrungie or a clean washcloth.  5.  Apply the CHG Soap to your body ONLY FROM THE NECK DOWN.  Do not use on open wounds or open sores.  Avoid contact with your eyes, ears, mouth and genitals (private parts).  Wash genitals (private parts) with your normal soap.  6.  Wash thoroughly, paying special attention to the area where your surgery will be performed.  7.  Thoroughly rinse your body with warm water from the neck down.  8.  DO NOT shower/wash with your normal soap after using and rinsing off the CHG Soap.  9.  Pat yourself dry with a clean towel.            10.  Wear clean pajamas.            11.  Place clean sheets on your bed the night  of your first shower and do not sleep with pets.  Day of Surgery  Do not apply any lotions/deoderants the morning of surgery.  Please wear clean clothes to the hospital/surgery center.

## 2014-01-29 ENCOUNTER — Ambulatory Visit (HOSPITAL_COMMUNITY)
Admission: RE | Admit: 2014-01-29 | Discharge: 2014-01-29 | Disposition: A | Discharge status: Home / Self Care | Payer: Medicare Other | Source: Ambulatory Visit | Attending: Thoracic Surgery (Cardiothoracic Vascular Surgery) | Admitting: Thoracic Surgery (Cardiothoracic Vascular Surgery)

## 2014-01-29 ENCOUNTER — Encounter (HOSPITAL_COMMUNITY)
Admission: RE | Admit: 2014-01-29 | Discharge: 2014-01-29 | Disposition: A | Discharge status: Home / Self Care | Payer: Medicare Other | Source: Ambulatory Visit | Attending: Cardiovascular Disease | Admitting: Cardiovascular Disease

## 2014-01-29 ENCOUNTER — Encounter (HOSPITAL_COMMUNITY): Payer: Self-pay

## 2014-01-29 ENCOUNTER — Encounter: Payer: Self-pay | Admitting: Surgery

## 2014-01-29 ENCOUNTER — Telehealth: Payer: Self-pay | Admitting: Cardiology

## 2014-01-29 VITALS — BP 147/67 | HR 83 | Temp 97.7°F | Resp 18 | Ht 63.0 in | Wt 123.4 lb

## 2014-01-29 DIAGNOSIS — I350 Nonrheumatic aortic (valve) stenosis: Secondary | ICD-10-CM | POA: Insufficient documentation

## 2014-01-29 DIAGNOSIS — I359 Nonrheumatic aortic valve disorder, unspecified: Secondary | ICD-10-CM

## 2014-01-29 DIAGNOSIS — Z01812 Encounter for preprocedural laboratory examination: Secondary | ICD-10-CM | POA: Insufficient documentation

## 2014-01-29 DIAGNOSIS — Z01810 Encounter for preprocedural cardiovascular examination: Secondary | ICD-10-CM | POA: Diagnosis not present

## 2014-01-29 DIAGNOSIS — I480 Paroxysmal atrial fibrillation: Secondary | ICD-10-CM | POA: Diagnosis not present

## 2014-01-29 DIAGNOSIS — Z7901 Long term (current) use of anticoagulants: Secondary | ICD-10-CM | POA: Insufficient documentation

## 2014-01-29 DIAGNOSIS — R7302 Impaired glucose tolerance (oral): Secondary | ICD-10-CM | POA: Diagnosis not present

## 2014-01-29 LAB — BLOOD GAS, ARTERIAL
Acid-Base Excess: 3 mmol/L — ABNORMAL HIGH (ref 0.0–2.0)
BICARBONATE: 26.7 meq/L — AB (ref 20.0–24.0)
Drawn by: 42180
FIO2: 0.21 %
O2 SAT: 98.2 %
Patient temperature: 98.6
TCO2: 27.9 mmol/L (ref 0–100)
pCO2 arterial: 38.9 mmHg (ref 35.0–45.0)
pH, Arterial: 7.452 — ABNORMAL HIGH (ref 7.350–7.450)
pO2, Arterial: 104 mmHg — ABNORMAL HIGH (ref 80.0–100.0)

## 2014-01-29 LAB — URINALYSIS, ROUTINE W REFLEX MICROSCOPIC
Bilirubin Urine: NEGATIVE
Glucose, UA: NEGATIVE mg/dL
Ketones, ur: NEGATIVE mg/dL
Nitrite: NEGATIVE
PH: 6 (ref 5.0–8.0)
PROTEIN: NEGATIVE mg/dL
Specific Gravity, Urine: 1.01 (ref 1.005–1.030)
Urobilinogen, UA: 0.2 mg/dL (ref 0.0–1.0)

## 2014-01-29 LAB — COMPREHENSIVE METABOLIC PANEL
ALT: 20 U/L (ref 0–35)
AST: 25 U/L (ref 0–37)
Albumin: 3.8 g/dL (ref 3.5–5.2)
Alkaline Phosphatase: 101 U/L (ref 39–117)
Anion gap: 17 — ABNORMAL HIGH (ref 5–15)
BILIRUBIN TOTAL: 0.5 mg/dL (ref 0.3–1.2)
BUN: 13 mg/dL (ref 6–23)
CALCIUM: 9.4 mg/dL (ref 8.4–10.5)
CHLORIDE: 99 meq/L (ref 96–112)
CO2: 22 mEq/L (ref 19–32)
CREATININE: 0.78 mg/dL (ref 0.50–1.10)
GFR calc non Af Amer: 74 mL/min — ABNORMAL LOW (ref >90)
GFR, EST AFRICAN AMERICAN: 86 mL/min — AB (ref >90)
GLUCOSE: 118 mg/dL — AB (ref 70–99)
Potassium: 3.8 mEq/L (ref 3.7–5.3)
Sodium: 138 mEq/L (ref 137–147)
Total Protein: 7.6 g/dL (ref 6.0–8.3)

## 2014-01-29 LAB — APTT: aPTT: 32 seconds (ref 24–37)

## 2014-01-29 LAB — URINE MICROSCOPIC-ADD ON

## 2014-01-29 LAB — CBC
HCT: 41.9 % (ref 36.0–46.0)
Hemoglobin: 13.9 g/dL (ref 12.0–15.0)
MCH: 29.4 pg (ref 26.0–34.0)
MCHC: 33.2 g/dL (ref 30.0–36.0)
MCV: 88.8 fL (ref 78.0–100.0)
Platelets: 197 10*3/uL (ref 150–400)
RBC: 4.72 MIL/uL (ref 3.87–5.11)
RDW: 13 % (ref 11.5–15.5)
WBC: 7.6 10*3/uL (ref 4.0–10.5)

## 2014-01-29 LAB — PROTIME-INR
INR: 1.34 (ref 0.00–1.49)
Prothrombin Time: 16.6 seconds — ABNORMAL HIGH (ref 11.6–15.2)

## 2014-01-29 LAB — ABO/RH: ABO/RH(D): A NEG

## 2014-01-29 LAB — SURGICAL PCR SCREEN
MRSA, PCR: NEGATIVE
STAPHYLOCOCCUS AUREUS: NEGATIVE

## 2014-01-29 NOTE — Telephone Encounter (Signed)
Rebecca [name] wanted to know when to have the pt. Stop her coumadin prior to the pt.s procedure with Dr. Excell Seltzerooper and Dr. Cornelius Moraswen, which is a valve replacement operation, you can let me know or Elnita MaxwellCheryl know and i can [facility]

## 2014-01-29 NOTE — Progress Notes (Signed)
Patient ID: Rebecca [name], female   DOB: April 07, 1926, 80 y.o.   MRN: [id number]   [facility]  [facility]IPLIN[facility] VALVE CLINIC    CARDIOTHORACIC SURGERY CONSULTATION REPORT  Referring Provider is Rebecca Bollman, MD PCP is  Rebecca Lope, MD  Chief Complaint  Patient presents with  . new cardiac    severe symptomatic aortic stenosis 2nd opinion consult    HPI:  The patient is an 80 year old woman with aortic stenosis diagnosed in March 2014 after she presented with a near syncopal episode. TEE at that time showed moderate to severe AS with a mean gradient of 31 mm Hg. She was referred to Dr. [name] who was first evaluated her in April of 2014. At that time the patient denied any exertional symptoms. Because of her advanced age and progressing memory loss, conservative management with close follow up was recommended. A 30 day event monitor was placed and demonstrated that the patient was having frequent episodes of paroxysmal atrial fibrillation without bradycardia. She was subsequently placed on long-term anticoagulation using warfarin. She was seen by Dr. [name] in follow up in January of this year at which time a repeat echocardiogram was performed. This demonstrated the presence of severe aortic stenosis with peak velocity across the aortic valve measured greater than 4 m/s corresponding to estimated mean transvalvular gradient 47 mm mercury. Left ventricular function was felt to have decreased somewhat with ejection fraction reported 40-45%. Left and right heart catheterization was performed. Mean transvalvular gradient across the aortic valve at catheterization was measured 31 mm mercury corresponding to a calculated aortic valve area of 0.9 cm. The patient had mild nonobstructive coronary artery disease, normal LV function, and normal pulmonary artery pressures. She continued to deny any symptoms and remained very active working in her yard and doing housework. Then in August  she has a prolonged syncopal episode. She had gotten up and reported feeling "funny" and "not quite right". She sat down at the kitchen table to eat breakfast and passed out without warning. Her husband saw this happen. She was found slumped over the kitchen table by her daughter. She was unresponsive and breathing slowly. Her family put her down on the floor and she eventually regained full consciousness after about 10 minutes. She was admitted to the hospital and ruled out for myocardial infarction by enzymes. An echocardiogram was performed revealing the presence of aortic stenosis with normal left ventricular systolic function. Mean gradient across the aortic valve measured slightly less (32 mmHg, peak velocity 3.7 m/s) than it did at the time of the echocardiogram performed last January. A 30 day event monitor was placed and she was discharged home.  Patient is married and lives with her husband in an apartment at her daughter's house. Her daughter works for [employer]. She has previously remained relatively healthy and physically active all of her life. Her only hospital admission was for observation after her syncopal episode. She was told to quit driving an automobile after her first syncopal episode. She has had some problems with progressive short-term memory loss and other behavioral issues consistent with dementia for which she has been followed by Dr. Marjory [name] for the past few years. She has been on Aricept and is completely functional and independent.   Past Medical History  Diagnosis Date  . Hypertension   . High cholesterol   . UTI (lower urinary tract infection)   . Aortic stenosis   . Syncope 08/21/2012  . Paroxysmal atrial fibrillation   .  Age-related macular degeneration, wet, right eye   . Heart murmur     "slight"  . Dementia     "slight"/daughter 12/02/2013    Past Surgical History  Procedure Laterality Date  . Tonsillectomy    . Cardiac catheterization   04/2013  . Eye surgery      cataracts    Family History  Problem Relation Age of Onset  . Heart attack Mother   . Heart Problems Father   . Cerebral aneurysm Sister     History   Social History  . Marital Status: Married    Spouse Name: Rebecca [name]    Number of Children: 3  . Years of Education: 11th   Occupational History  . Retired    Social History Main Topics  . Smoking status: Former Smoker -- 0.40 packs/day for 30 years    Types: Cigarettes    Quit date: 04/30/1960  . Smokeless tobacco: Never Used  . Alcohol Use: No  . Drug Use: No  . Sexual Activity: Not on file   Other Topics Concern  . Not on file   Social History Narrative   Pt lives at home with her daughter.   Married 65 yrs.   Caffeine Use: 2-3 cups daily    Current Outpatient Prescriptions  Medication Sig Dispense Refill  . atorvastatin (LIPITOR) 20 MG tablet Take 20 mg by mouth daily.      [name] Besifloxacin HCl (BESIVANCE) 0.6 % SUSP Place 1 drop into the right eye 3 (three) times daily. Only use for 3 days after eye injections      . calcium citrate (CALCITRATE - DOSED IN MG ELEMENTAL CALCIUM) 950 MG tablet Take 1 tablet by mouth daily.      [name] donepezil (ARICEPT) 10 MG tablet Take 1 tablet (10 mg total) by mouth daily.  30 tablet  12  . Multiple Vitamin (MULTIVITAMIN) capsule Take 1 capsule by mouth daily.      [name] NIFEdipine (PROCARDIA XL/ADALAT-CC) 90 MG 24 hr tablet Take 90 mg by mouth daily.      [name] warfarin (COUMADIN) 2.5 MG tablet Take 2.5-3.75 mg by mouth daily. Take 3.75 mg every Monday & thurs then take 2.5 mg the rest of the week       No current facility-administered medications for this visit.    No Known Allergies    Review of Systems:  General: normal appetite, normal energy, no weight gain, no weight loss, no fever  Cardiac: no chest pain with exertion, no chest pain at rest, no SOB with exertion, no resting SOB, no PND, no orthopnea, no palpitations, + arrhythmia, + atrial  fibrillation, no LE edema, + dizzy spells, + syncope  Respiratory: no shortness of breath, no home oxygen, no productive cough, no dry cough, no bronchitis, no wheezing, no hemoptysis, no asthma, no pain with inspiration or cough, no sleep apnea, no CPAP at night  GI: no difficulty swallowing, no reflux, no frequent heartburn, no hiatal hernia, no abdominal pain, no constipation, no diarrhea, no hematochezia, no hematemesis, no melena  GU: no dysuria, no frequency, no urinary tract infection, no hematuria, no kidney stones, no kidney disease  Vascular: no pain suggestive of claudication, no pain in feet, no leg cramps, no varicose veins, no DVT, no non-healing foot ulcer  Neuro: no stroke, no TIA's, no seizures, no headaches, no temporary blindness one eye, no slurred speech, no peripheral neuropathy, no chronic pain, no instability of gait, + significant memory/cognitive dysfunction  Musculoskeletal: no arthritis,  no joint swelling, no myalgias, no difficulty walking, normal mobility  Skin: no rash, no itching, no skin infections, no pressure sores or ulcerations  Psych: no anxiety, no depression, no nervousness, no unusual recent stress  Eyes: + blurry vision related to macular degeneration, no floaters, + recent vision changes, does not wear glasses or contacts  ENT: no hearing loss, no loose or painful teeth, no dentures  Hematologic: + easy bruising, no abnormal bleeding, no clotting disorder, no frequent epistaxis  Endocrine: no diabetes, does not check CBG's at home            Physical Exam:   BP 137/82  Pulse 77  Ht 5\' 3"  (1.6 m)  Wt 122 lb (55.339 kg)  BMI 21.62 kg/m2  SpO2 93%  General:  Elderly, alert and conversant,   well-appearing  HEENT:  Unremarkable. NCAT/PERLA/EOMI, oropharynx clear  Neck:   no JVD, no bruits, no adenopathy or thyromegaly.   Chest:   clear to auscultation, symmetrical breath sounds, no wheezes, no rhonchi   CV:   RRR, grade III/VI crescendo/decrescendo  murmur heard throughout the                                                         precordium  Abdomen:  soft, non-tender, no masses or organomegaly  Extremities:  warm, well-perfused, pulses palpable, no LE edema  Rectal/GU  Deferred  Neuro:   Grossly non-focal and symmetrical throughout  Skin:   Clean and dry, no rashes, no breakdown   Diagnostic Tests:  2D Echo 12/03/2013:  Study Conclusions  - Left ventricle: The cavity size was normal. There was moderate concentric hypertrophy. Systolic function was normal. The estimated ejection fraction was in the range of 55% to 60%. Incoordinate septal motion. Doppler parameters are consistent with abnormal left ventricular relaxation (grade 1 diastolic dysfunction). The E/e&' ratio is >15, suggesting elevated LV filling pressure. - Aortic valve: The aortic valve is heavily calcified with restricted leaflet motion. Peak and mean gradients of 56 mmHg and 32 mmHg, respectively. Based on an LVOT diameter of 1.8 cm, the calculated AVA is 0.7-0.8 cm2. Consistent with severe aortic stenosis. The aortic valve gradient is probably undersampled. Valve area (VTI): 0.7 cm^2. Valve area (Vmax): 0.76 cm^2. - Mitral valve: Heavily calcified mitral annulus. There is mild mitral stenosis. Mild regurgitation. Valve area by continuity equation (using LVOT flow): 1.59 cm^2. - Left atrium: Moderately dilated (42 ml/m2).  Impressions:  - Compared to the prior echo in 04/2013, there has been an improvement in EF to 55-60%. There is still severe aortic stenosis, however, gradients are undersampled during this study. There is probably mild mitral stenosis as well.    CTA Abdomen/Pelvis:  VASCULAR MEASUREMENTS PERTINENT TO TAVR:  AORTA:  Minimal Aortic Diameter - 10 x 9 mm  Severity of Aortic Calcification - Severe and often times  circumferential  RIGHT PELVIS:  Right Common Iliac Artery -  Minimal Diameter - 6.3 x 8.4 mm  Tortuosity - Moderate    Calcification - Moderate  Right External Iliac Artery -  Minimal Diameter - 6.1 x 6.6 mm  Tortuosity - Mild  Calcification - None  Right Common Femoral Artery -  Minimal Diameter - 6.0 x 7.2 mm  Tortuosity - Mild  Calcification - Mild to moderate  LEFT PELVIS:  Left Common Iliac Artery -  Minimal Diameter - 6.6 x 6.1 mm  Tortuosity - Moderate  Calcification - Moderate  Left External Iliac Artery -  Minimal Diameter - 7.0 x 6.6 mm  Tortuosity - Mild  Calcification - None  Left Common Femoral Artery -  Minimal Diameter - 5.6 x 6.5 mm  Tortuosity - Mild  Calcification - Mild  Review of the MIP images confirms the above findings.  IMPRESSION:  1. Vascular findings and measurements pertinent to potential TAVR  procedure, as above. This patient does appear to have suitable  pelvic arterial access.  2. 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 In the lower pole moiety of the left renal  collecting system and proximal lower pole ureter there is  circumferential urothelial thickening, concerning for potential  upper tract urothelial neoplasm. Urologic consultation is  recommended for further evaluation.  3. Large teratoma in the pelvis redemonstrated.  4. New side branch ectasia in the head and proximal body of the  pancreas. This is nonspecific, and could relate to prior episodes of  pancreatitis. However, the possibility of a slow-growing neoplasm  such is intraductal papillary mucinous neoplasm (IPMN) is not  excluded. Followup evaluation with MRI of the abdomen with and  without IV gadolinium and MRCP in 6 months is recommended to ensure  the stability of these findings.  5. Additional incidental findings, as above.  These results will be called to the ordering clinician or  representative by the Radiologist Assistant, and communication  documented in the PACS or zVision Dashboard.     Cardiac CTA  FINDINGS:  Aortic Valve:  Trileaflet,  Aorta: Normal size aortic root and thoracic aorta. Mild  calcifications around the origin of brachiocephalic arteries. No  dissection.  Sinotubular Junction: 29 x 27 mm  Ascending Thoracic Aorta: 37 x 36 mm  Aortic Arch: 30 x 30 mm  Descending Thoracic Aorta: 24 x 24 mm  Sinus of Valsalva Measurements:  Non-coronary: 28 mm  Right -coronary: 28 mm  Left -coronary: 30 mm  Coronary Artery Height above Annulus:  Left Main: 9 mm  Right Coronary: 9 mm  Virtual Basal Annulus Measurements:  Maximum/Minimum Diameter: 24 x 22 mm  Perimeter: 76 mm  Area: 403 mm2  Coronary Arteries: Originating in normal position. The study wasn't  designed for coronary arteries evaluation (no use of nitroglycerin).  Optimum Fluoroscopic Angle for Delivery: LAO 7 CAU 7  IMPRESSION:  1. Mildly calcified trileaflet aortic valve with limited leaflet  opening and annular size suitable for delivery of 23 mm TAVR  Edward-Sapien XT valve.  2. Sufficient coronaries to annular distance.  3. Optimum fluoroscopic angle for delivery LAO 7 CAU 7.  4. Sufficient sheath to tip distance (67 mm).     Cardiac Cath 05/25/2013:  Procedural Findings:  Hemodynamics  RA 7/5 mean 4 mm Hg  RV 32/8 mm Hg  PA 24/11 mm Hg  PCWP 13/12 mean 10 mm Hg  LV 154/8 mm Hg  AO 115/442 mean 67 mm Hg.  AV mean gradient: 31 mm Hg, peak to peak gradient: 39 mm Hg. AV area: 0.9 cm2 with index 0.6  Oxygen saturations:  PA 71%  AO 93%  Cardiac Output (Fick) 4.82 L/min  Cardiac Index (Fick) 3.11 L/min/m2  Coronary angiography:  Coronary dominance: codominant  Left mainstem: Normal  Left anterior descending (LAD): Mild disease in the proximal vessel to 30%. The first diagonal is normal.  Left  circumflex (LCx): Very large codominant vessel with multiple OM branches. Normal.  Right coronary artery (RCA): Normal.  Left ventriculography: Hand injection only. Left ventricular systolic function is normal, LVEF is estimated at  55-65%, there is no significant mitral regurgitation. The aortic valve and mitral annulus are heavily calcified  Final Conclusions:  1. Mild nonobstructive CAD  2. Normal LV function.  3. Normal right heart pressures  4. Severe Aortic stenosis.    STS Risk Calculator  Procedure AVR  Risk of Mortality 4.83%  Morbidity or Mortality 20.4%  Prolonged LOS 10.0%  Short LOS 18.7%  Permanent Stroke 2.4%  Prolonged Vent Support 14.0%  DSW Infection 0.2%  Renal Failure 3.9%  Reoperation 9.6%     Impression:  She has severe symptomatic aortic stenosis with multiple syncopal episodes, the last of which was 10 minutes in duration. She has recurrent paroxysmal atrial fibrillation, but event monitors have not demonstrated any episodes of bradycardia which might explain the patient's syncope. She is still very active but is 80 years old with progressive dementia over the past few years and I think this would certainly affect her ability to recover from open surgical AVR and would increase her risk above that predicted by the STS Risk Calculator. She would get through the surgery but would likely have a very slow and prolonged recovery and may not get back to her current functional level. I agree that TAVR is a reasonable alternative that would probably have less overall risk for her and lead to a better functional recovery. I discussed all of this with the patient, her husband, and daughter and answered their questions.   Plan:  She will proceed with TAVR by Dr. Cornelius Moras and Dr. Excell Seltzer on 02/02/2014.    Alleen Borne, MD 01/27/2014

## 2014-01-30 LAB — HEMOGLOBIN A1C
HEMOGLOBIN A1C: 6 % — AB (ref <5.7)
MEAN PLASMA GLUCOSE: 126 mg/dL — AB (ref <117)

## 2014-01-31 NOTE — H&P (Addendum)
[address]       [place] [address]             [phone]          CARDIOTHORACIC SURGERY HISTORY AND PHYSICAL EXAM  Referring Provider is [name], [name], MD PCP is  [name], MD    Chief Complaint   Patient presents with   .  Aortic Stenosis       Surgical eval for TAVR     HPI:  Patient is an 80 year old married white female from [place] with known history of severe aortic stenosis, hypertension, and atrial fibrillation who recently suffered a recurrent episode of syncope and has now been referred to consider possible surgical treatment options for management of severe, symptomatic aortic stenosis.  The patient first presented with aortic stenosis in March of 2014. At that time she suffered a near syncopal episode. She was hospitalized briefly and treated for a urinary tract infection.  Transthoracic echocardiogram performed at that time demonstrated what was felt to be moderate to severe aortic stenosis with mean transvalvular gradient across the aortic valve estimated to be 31 mm mercury. Left ventricular systolic function was normal.  She was referred to Dr. [name] who was first evaluated her in April of 2014. At that time the patient denied any exertional symptoms. Because of her advanced age and progressing memory loss, conservative management with close follow up was recommended.  A 30 day event monitor was placed and demonstrated that the patient was having frequent episodes of paroxysmal atrial fibrillation without bradycardia.  She was subsequently placed on long-term anticoagulation using warfarin.  She was seen by Dr. [name] in follow up in January of this year at which time a repeat echocardiogram was performed. This demonstrated the presence of severe aortic stenosis with peak velocity across the aortic valve measured greater than 4 m/s corresponding to estimated mean transvalvular gradient 47 mm mercury. Left ventricular function was felt to  have decreased somewhat with ejection fraction reported 40-45%.  Left and right heart catheterization was performed. Mean transvalvular gradient across the aortic valve at catheterization was measured 31 mm mercury corresponding to a calculated aortic valve area of 0.9 cm. The patient had mild nonobstructive coronary artery disease, normal LV function and normal pulmonary artery pressures.   At that time the patient continued to deny any symptoms of exertional shortness of breath, chest discomfort, or fatigue.  She was followed closely.    Early in August the patient suffered a prolonged syncopal episode. She had gotten up and reported feeling "funny" and "not quite right".  She sat down at the kitchen table to eat breakfast and passed out without warning.  Her husband saw this happen. She was found slumped over the kitchen table by her daughter.  She was unresponsive and breathing slowly. Her family put her down on the floor and she eventually regained full consciousness after about 10 minutes. After the event, she denied lightheadedness, palpitations or chest pain. She was brought to the ED by EMS where she reported feeling normal. EKG revealed NSR with chronic LBBB. BP was mildly elevated. She was admitted to the hospital and ruled out for myocardial infarction by enzymes.  An echocardiogram was performed revealing the presence of aortic stenosis with normal left ventricular systolic function. Mean gradient across the aortic valve actually measured slightly less (32 mmHg, peak velocity 3.7 m/s) than it did at the time of the echocardiogram performed last January.  A 30 day event monitor was placed and she was discharged home. Since hospital discharge she has felt well and she was seen in follow up by Dr. [name].  Her event monitor has demonstrated some nonsustained episodes of PSVT but no bradycardia.  She was referred to discuss treatment options for management of severe aortic stenosis.  Patient is married  and lives with her husband in an apartment at her daughter's house.  She has previously remained relatively healthy and physically active all of her life. She was told to quit driving an automobile after her first syncopal episode. She has had some problems with progressive short-term memory loss and other behavioral issues consistent with dementia for which she has been followed by Dr. [name] for the past few years.  These issues have slowly progressed.  She denies any symptoms of exertional shortness of breath, chest tightness, or fatigue. She admits that she is not very active physically, but she states that she does everything that she wants to do. Her mobility is not impaired. She has not had PND, orthopnea, or lower extremity edema. She has not had palpitations in associated with her known recurrent paroxysmal atrial fibrillation. She has not had complications managing Coumadin therapy over the past year.   Past Medical History  Diagnosis Date  . Hypertension   . High cholesterol   . UTI (lower urinary tract infection)   . Aortic stenosis   . Syncope 08/21/2012  . Paroxysmal atrial fibrillation   . Age-related macular degeneration, wet, right eye   . Heart murmur     "slight"  . Dementia     "slight"/daughter 12/02/2013    Past Surgical History  Procedure Laterality Date  . Tonsillectomy    . Cardiac catheterization  04/2013  . Eye surgery      cataracts    Family History  Problem Relation Age of Onset  . Heart attack Mother   . Heart Problems Father   . Cerebral aneurysm Sister     Social History History  Substance Use Topics  . Smoking status: Former Smoker -- 0.40 packs/day for 30 years    Types: Cigarettes    Quit date: 04/30/1960  . Smokeless tobacco: Never Used  . Alcohol Use: No    Prior to Admission medications   Medication Sig Start Date End Date Taking? Authorizing Provider  atorvastatin (LIPITOR) 20 MG tablet Take 20 mg by mouth daily.   Yes Historical  Provider, MD  Besifloxacin HCl (BESIVANCE) 0.6 % SUSP Place 1 drop into the right eye 3 (three) times daily. Only use for 3 days after eye injections   Yes Historical Provider, MD  calcium citrate (CALCITRATE - DOSED IN MG ELEMENTAL CALCIUM) 950 MG tablet Take 1 tablet by mouth daily.   Yes Historical Provider, MD  donepezil (ARICEPT) 10 MG tablet Take 1 tablet (10 mg total) by mouth daily. 11/16/13  Yes Suanne Marker, MD  Multiple Vitamin (MULTIVITAMIN) capsule Take 1 capsule by mouth daily.   Yes Historical Provider, MD  NIFEdipine (PROCARDIA XL/ADALAT-CC) 90 MG 24 hr tablet Take 90 mg by mouth daily. 09/07/13  Yes Historical Provider, MD  warfarin (COUMADIN) 2.5 MG tablet Take 2.5-3.75 mg by mouth daily. Take 3.75 mg every Monday & thurs then take 2.5 mg the rest of the week   Yes Historical Provider, MD    No Known Allergies    Review of Systems:              General:  normal appetite, normal energy, no weight gain, no weight loss, no fever             Cardiac:                      no chest pain with exertion, no chest pain at rest, no SOB with exertion, no resting SOB, no PND, no orthopnea, no palpitations, + arrhythmia, + atrial fibrillation, no LE edema, + dizzy spells, + syncope             Respiratory:                no shortness of breath, no home oxygen, no productive cough, no dry cough, no bronchitis, no wheezing, no hemoptysis, no asthma, no pain with inspiration or cough, no sleep apnea, no CPAP at night             GI:                                no difficulty swallowing, no reflux, no frequent heartburn, no hiatal hernia, no abdominal pain, no constipation, no diarrhea, no hematochezia, no hematemesis, no melena             GU:                              no dysuria,  no frequency, no urinary tract infection, no hematuria, no kidney stones, no kidney disease             Vascular:                     no pain suggestive of claudication, no pain in feet,  no leg cramps, no varicose veins, no DVT, no non-healing foot ulcer             Neuro:                         no stroke, no TIA's, no seizures, no headaches, no temporary blindness one eye,  no slurred speech, no peripheral neuropathy, no chronic pain, no instability of gait, + significant memory/cognitive dysfunction             Musculoskeletal:         no arthritis, no joint swelling, no myalgias, no difficulty walking, normal mobility               Skin:                            no rash, no itching, no skin infections, no pressure sores or ulcerations             Psych:                         no anxiety, no depression, no nervousness, no unusual recent stress             Eyes:                           + blurry vision related to macular degeneration, no floaters, + recent vision changes, does not wear glasses or contacts             ENT:  no hearing loss, no loose or painful teeth, no dentures             Hematologic:               + easy bruising, no abnormal bleeding, no clotting disorder, no frequent epistaxis             Endocrine:                   no diabetes, does not check CBG's at home                                                       Physical Exam:              BP 129/76  Pulse 86  Resp 20  Ht 5\' 3"  (1.6 m)  Wt 121 lb (54.885 kg)  BMI 21.44 kg/m2  SpO2 97%             General:                      Thin, elderly but o/w well-appearing             HEENT:                       Unremarkable               Neck:                           no JVD, no bruits, no adenopathy               Chest:                         clear to auscultation, symmetrical breath sounds, no wheezes, no rhonchi               CV:                              Irregular rate and rhythm, grade IV/VI crescendo/decrescendo murmur heard best at sternal border,  no diastolic murmur             Abdomen:                    soft, non-tender, no masses               Extremities:                  warm, well-perfused, pulses palpable in groin, no LE edema             Rectal/GU                   Deferred             Neuro:                         Grossly non-focal and symmetrical throughout             Skin:  Clean and dry, no rashes, no breakdown   Diagnostic Tests:    Transthoracic Echocardiography  Patient:    Audreana, Hancox MR #:       [id number] Study Date: 05/06/2013 Gender:     F Age:        51 Height:     160cm Weight:     54.4kg BSA:        1.22m^2 Pt. Status: Room:    ATTENDING    [name], Peter  ORDERING     [name], Peter  REFERRING    [name], Peter  SONOGRAPHER  Aida Raider, RDCS  PERFORMING   [facility], Outpatient cc:  ------------------------------------------------------------ LV EF: 40% -   45%  ------------------------------------------------------------ Indications:      424.1 Aortic valve disorders.  ------------------------------------------------------------ History:   PMH:  Acquired from the patient and from the patient's chart.  PMH:  Syncope. Paroxysmal Atrial Fibrillation.  Risk factors:  Former tobacco use. Hypertension. Dyslipidemia.  ------------------------------------------------------------ Study Conclusions  - Left ventricle: The cavity size was normal. Wall thickness   was increased in a pattern of moderate LVH. Systolic   function was mildly to moderately reduced. The estimated   ejection fraction was in the range of 40% to 45%. Diffuse   hypokinesis. There was an increased relative contribution   of atrial contraction to ventricular filling. Doppler   parameters are consistent with abnormal left ventricular   relaxation (grade 1 diastolic dysfunction). - Aortic valve: There was severe stenosis. Mild   regurgitation. - Left atrium: The atrium was mildly dilated. Transthoracic echocardiography.  M-mode, complete 2D, spectral Doppler, and color Doppler.  Height:  Height: 160cm. Height: 63in.   Weight:  Weight: 54.4kg. Weight: 119.8lb.  Body mass index:  BMI: 21.3kg/m^2.  Body surface area:    BSA: 1.80m^2.  Blood pressure:     160/69.  Patient status:  Outpatient.  Location:  [facility]  ------------------------------------------------------------  ------------------------------------------------------------ Left ventricle:  The cavity size was normal. Wall thickness was increased in a pattern of moderate LVH. Systolic function was mildly to moderately reduced. The estimated ejection fraction was in the range of 40% to 45%. Diffuse hypokinesis. There was an increased relative contribution of atrial contraction to ventricular filling. Early diastolic septal annular tissue Doppler velocities Ea were abnormal. Doppler parameters are consistent with abnormal left ventricular relaxation (grade 1 diastolic dysfunction).  ------------------------------------------------------------ Aortic valve:   Moderately thickened, moderately calcified leaflets.  Doppler:   There was severe stenosis.    Mild regurgitation.    VTI ratio of LVOT to aortic valve: 0.26. Valve area: 0.74cm^2(VTI). Indexed valve area: 0.47cm^2/m^2 (VTI). Peak velocity ratio of LVOT to aortic valve: 0.25. Valve area: 0.71cm^2 (Vmax). Indexed valve area: 0.46cm^2/m^2 (Vmax).    Mean gradient: 47mm Hg (S). Peak gradient: 65mm Hg (S).  ------------------------------------------------------------ Aorta:  Aortic root: The aortic root was normal in size. Ascending aorta: The ascending aorta was normal in size.  ------------------------------------------------------------ Mitral valve:   Moderately thickened, moderately calcified leaflets .  Doppler:   Trivial regurgitation.    Peak gradient: 3mm Hg (D).  ------------------------------------------------------------ Left atrium:  The atrium was mildly dilated.  ------------------------------------------------------------ Right ventricle:  The cavity size was  normal. Systolic function was normal.  ------------------------------------------------------------ Pulmonic valve:    Structurally normal valve.   Cusp separation was normal.  Doppler:  Transvalvular velocity was within the normal range.  No regurgitation.  ------------------------------------------------------------ Tricuspid valve:   Structurally normal valve.   Leaflet separation was normal.  Doppler:  Transvalvular velocity was within the normal range.  Trivial regurgitation.  ------------------------------------------------------------ Pulmonary artery:   Systolic pressure was within the normal range.  ------------------------------------------------------------ Right atrium:  The atrium was normal in size.  ------------------------------------------------------------ Pericardium:  There was no pericardial effusion.  ------------------------------------------------------------ Systemic veins: Inferior vena cava: The vessel was normal in size; the respirophasic diameter changes were in the normal range (= 50%); findings are consistent with normal central venous pressure.  ------------------------------------------------------------  2D measurements        Normal  Doppler measurements   Normal Left ventricle                 Main pulmonary LVID ED,   36.6 mm     43-52   artery chord,                         Pressure,    24 mm Hg  =30 PLAX                           S LVID ES,   23.6 mm     23-38   Left ventricle chord,                         Ea, lat    5.48 cm/s   ------ PLAX                           ann, tiss FS, chord,   36 %      >29     DP PLAX                           E/Ea, lat  16.2        ------ LVPW, ED   13.2 mm     ------  ann, tiss IVS/LVPW   1.25        <1.3    DP ratio, ED                      Ea, med    4.06 cm/s   ------ Ventricular septum             ann, tiss IVS, ED    16.5 mm     ------  DP LVOT                           E/Ea, med  21.8         ------ Diam, S      19 mm     ------  ann, tiss     7 Area       2.84 cm^2   ------  DP Diam         19 mm     ------  LVOT Aorta                          Peak vel,   101 cm/s   ------ Root diam,   30 mm     ------  S ED                             VTI, S     25.9 cm     ------  AAo AP       36 mm     ------  Stroke vol 73.4 ml     ------ diam, S                        Stroke     47.1 ml/m^2 ------ Left atrium                    index AP dim       36 mm     ------  Aortic valve AP dim     2.31 cm/m^2 <2.2    Peak vel,   404 cm/s   ------ index                          S                                Mean vel,   328 cm/s   ------                                S                                VTI, S      100 cm     ------                                Mean         47 mm Hg  ------                                gradient,                                S                                Peak         65 mm Hg  ------                                gradient,                                S                                VTI ratio  0.26        ------                                LVOT/AV                                Area, VTI  0.74 cm^2   ------  Area index 0.47 cm^2/m ------                                (VTI)           ^2                                Peak vel   0.25        ------                                ratio,                                LVOT/AV                                Area, Vmax 0.71 cm^2   ------                                Area index 0.46 cm^2/m ------                                (Vmax)          ^2                                Regurg PHT  482 ms     ------                                Mitral valve                                Peak E vel 88.8 cm/s   ------                                Peak A vel  151 cm/s   ------                                Decelerati  363 ms     150-23                                on time                 0                                Peak          3 mm Hg  ------                                gradient,  D                                Peak E/A    0.6        ------                                ratio                                Tricuspid valve                                Regurg      217 cm/s   ------                                peak vel                                Peak RV-RA   19 mm Hg  ------                                gradient,                                S                                Systemic veins                                Estimated     5 mm Hg  ------                                CVP                                Right ventricle                                Pressure,    24 mm Hg  <30                                S                                Sa vel,    14.1 cm/s   ------                                lat ann,  tiss DP   ------------------------------------------------------------ Prepared and Electronically Authenticated by  Kristeen Miss [id number]          Transthoracic Echocardiography  Patient:    Aalivia, Mcgraw MR #:       [id number] Study Date: 12/03/2013 Gender:     F Age:        56 Height:     160 cm Weight:     54 kg BSA:        1.55 m^2 Pt. Status: Room:       [id number]   ADMITTING    Marca Ancona, M.D.  ATTENDING    Marca Ancona, M.D.  ORDERING     Marca Ancona, M.D.  REFERRING    Marca Ancona, M.D.  SONOGRAPHER  Arvil Chaco  PERFORMING   [facility] Inpatient  cc:  ------------------------------------------------------------------- LV EF: 55% -   60%  ------------------------------------------------------------------- Indications:      Aortic stenosis 424.1.  ------------------------------------------------------------------- History:   PMH:   Syncope.  Atrial fibrillation.  Risk  factors: Dyslipidemia.  ------------------------------------------------------------------- Study Conclusions  - Left ventricle: The cavity size was normal. There was moderate   concentric hypertrophy. Systolic function was normal. The   estimated ejection fraction was in the range of 55% to 60%.   Incoordinate septal motion. Doppler parameters are consistent   with abnormal left ventricular relaxation (grade 1 diastolic   dysfunction). The E/e&' ratio is >15, suggesting elevated LV   filling pressure. - Aortic valve: The aortic valve is heavily calcified with   restricted leaflet motion. Peak and mean gradients of 56 mmHg and   32 mmHg, respectively. Based on an LVOT diameter of 1.8 cm, the   calculated AVA is 0.7-0.8 cm2. Consistent with severe aortic   stenosis. The aortic valve gradient is probably undersampled.   Valve area (VTI): 0.7 cm^2. Valve area (Vmax): 0.76 cm^2. - Mitral valve: Heavily calcified mitral annulus. There is mild   mitral stenosis. Mild regurgitation. Valve area by continuity   equation (using LVOT flow): 1.59 cm^2. - Left atrium: Moderately dilated (42 ml/m2).  Impressions:  - Compared to the prior echo in 04/2013, there has been an   improvement in EF to 55-60%. There is still severe aortic   stenosis, however, gradients are undersampled during this study.   There is probably mild mitral stenosis as well.  Transthoracic echocardiography.  M-mode, complete 2D, spectral Doppler, and color Doppler.  Birthdate:  Patient birthdate: 1926/02/13.  Age:  Patient is 80 yr old.  Sex:  Gender: female. BMI: 21.1 kg/m^2.  Blood pressure:     145/73  Patient status: Inpatient.  Study date:  Study date: 12/03/2013. Study time: 07:55 AM.  Location:  Echo laboratory.  -------------------------------------------------------------------  ------------------------------------------------------------------- Left ventricle:  The cavity size was normal. There was  moderate concentric hypertrophy. Systolic function was normal. The estimated ejection fraction was in the range of 55% to 60%. Incoordinate septal motion. Doppler parameters are consistent with abnormal left ventricular relaxation (grade 1 diastolic dysfunction). The E/e&' ratio is >15, suggesting elevated LV filling pressure.  ------------------------------------------------------------------- Aortic valve:  The aortic valve is heavily calcified with restricted leaflet motion. Peak and mean gradients of 56 mmHg and 32 mmHg, respectively. Based on an LVOT diameter of 1.8 cm, the calculated AVA is 0.7-0.8 cm2. Consistent with severe aortic stenosis. The aortic valve gradient is probably undersampled. Doppler:     Valve area (VTI): 0.7 cm^2. Indexed valve area (VTI): 0.45 cm^2/m^2. Valve area (Vmax): 0.76 cm^2. Indexed valve area (Vmax):  0.49 cm^2/m^2.    Mean gradient (S): 32 mm Hg. Peak gradient (S): 56 mm Hg.  ------------------------------------------------------------------- Aorta:  Aortic root: The aortic root was normal in size. Ascending aorta: The ascending aorta was normal in size.  ------------------------------------------------------------------- Mitral valve:  Heavily calcified mitral annulus. There is mild mitral stenosis. Mild regurgitation.  Doppler:     Valve area by pressure half-time: 2.65 cm^2. Indexed valve area by pressure half-time: 1.71 cm^2/m^2. Valve area by continuity equation (using LVOT flow): 1.59 cm^2. Indexed valve area by continuity equation (using LVOT flow): 1.03 cm^2/m^2.    Mean gradient (D): 3 mm Hg. Peak gradient (D): 3 mm Hg.  ------------------------------------------------------------------- Left atrium:  Moderately dilated (42 ml/m2).  ------------------------------------------------------------------- Atrial septum:  No defect or patent foramen ovale was identified.    ------------------------------------------------------------------- Right ventricle:  The cavity size was normal. Wall thickness was normal. Systolic function was normal.  ------------------------------------------------------------------- Pulmonic valve:    The valve appears to be grossly normal. Doppler:  There was no significant regurgitation.  ------------------------------------------------------------------- Tricuspid valve:   Doppler:  There was mild regurgitation.  ------------------------------------------------------------------- Pulmonary artery:   The main pulmonary artery was normal-sized.  ------------------------------------------------------------------- Right atrium:  The atrium was normal in size.  ------------------------------------------------------------------- Pericardium:  There was no pericardial effusion.  ------------------------------------------------------------------- Systemic veins: Inferior vena cava: The vessel was normal in size. The respirophasic diameter changes were in the normal range (= 50%), consistent with normal central venous pressure.  ------------------------------------------------------------------- Measurements   Left ventricle                 Value          05/06/2013 Reference  LV ID, ED, PLAX        (L)     27.5  mm       36.6       43 - 52  chordal  LV ID, ES, PLAX        (L)     20.7  mm       23.6       23 - 38  chordal  LV fx shortening, PLAX (L)     25    %        36         >=29  chordal  LV PW thickness, ED            13.6  mm       13.2       ---------  IVS/LV PW ratio, ED            0.9            1.25       <=1.3  Stroke volume, 2D              58    ml       ---------- ---------  Stroke volume/bsa, 2D          37    ml/m^2   ---------- ---------  LV e&', lateral                 5.21  cm/s     5.48       ---------  LV E/e&', lateral               16.2           16.2       ---------  LV e&', medial  3.99  cm/s     4.06       ---------  LV E/e&', medial                21.15          21.87      ---------  LV e&', average                 4.6   cm/s     ---------- ---------  LV E/e&', average               18.35          ---------- ---------    Ventricular septum             Value          05/06/2013 Reference  IVS thickness, ED              12.3  mm       16.5       ---------    LVOT                           Value          05/06/2013 Reference  LVOT ID, S                     18    mm       19         ---------  LVOT area                      2.54  cm^2     2.84       ---------    Aortic valve                   Value          05/06/2013 Reference  Aortic valve peak              374   cm/s     404        ---------  velocity, S  Aortic valve mean              265   cm/s     328        ---------  velocity, S  Aortic valve VTI, S            83.1  cm       100        ---------  Aortic mean gradient,          32    mm Hg    47         ---------  S  Aortic peak gradient,          56    mm Hg    65         ---------  S  Aortic valve area, VTI         0.7   cm^2     0.74       ---------  Aortic valve area/bsa,         0.45  cm^2/m^2 0.47       ---------  VTI  Aortic valve area,             0.76  cm^2     0.71       ---------  peak velocity  Aortic valve area/bsa,         0.49  cm^2/m^2 0.46       ---------  peak velocity    Aorta                          Value          05/06/2013 Reference  Aortic root ID, ED             30    mm       30         ---------    Left atrium                    Value          05/06/2013 Reference  LA ID, A-P, ES                 32    mm       36         ---------  LA ID/bsa, A-P                 2.06  cm/m^2   2.31       <=2.2    Mitral valve                   Value          05/06/2013 Reference  Mitral E-wave peak             84.4  cm/s     88.8       ---------  velocity  Mitral A-wave peak             135   cm/s     151        ---------  velocity   Mitral mean velocity,          86.2  cm/s     ---------- ---------  D  Mitral deceleration    (H)     275   ms       363        150 - 230  time  Mitral pressure                83    ms       ---------- ---------  half-time  Mitral mean gradient,          3     mm Hg    ---------- ---------  D  Mitral peak gradient,          3     mm Hg    3          ---------  D  Mitral E/A ratio, peak         0.6            0.6        ---------  Mitral valve area,             2.65  cm^2     ---------- ---------  PHT, DP  Mitral valve area/bsa,         1.71  cm^2/m^2 ---------- ---------  PHT, DP  Mitral valve area,             1.59  cm^2     ---------- ---------  LVOT continuity  Mitral valve area/bsa,         1.03  cm^2/m^2 ---------- ---------  LVOT continuity  Mitral annulus VTI, D          36.5  cm       ---------- ---------    Pulmonary arteries             Value          05/06/2013 Reference  PA pressure, S, DP             27    mm Hg    ---------- <=30    Tricuspid valve                Value          05/06/2013 Reference  Tricuspid regurg peak          247   cm/s     217        ---------  velocity  Tricuspid peak RV-RA           24    mm Hg    19         ---------  gradient  Tricuspid maximal              247   cm/s     ---------- ---------  regurg velocity, PISA    Systemic veins                 Value          05/06/2013 Reference  Estimated CVP                  3     mm Hg    5          ---------    Right ventricle                Value          05/06/2013 Reference  RV pressure, S, DP             27    mm Hg    24         <=30  RV s&', lateral, S              15.9  cm/s     14.1       ---------  Legend: (L)  and  (H)  mark values outside specified reference range.  ------------------------------------------------------------------- Prepared and Electronically Authenticated by  Zoila Shutter MD 2015-08-06T14:53:50     Cardiac Catheterization Procedure Note  Name: Jody Aguinaga MRN: [id number] DOB: 02-28-1926  Procedure: Right Heart Cath, Left Heart Cath, Selective Coronary Angiography, LV angiography  Indication: 80 yo WF with history of severe aortic stenosis. Recent Echo demonstrated decline in LV function.   Procedural Details: The right groin was prepped, draped, and anesthetized with 1% lidocaine. Using the modified Seldinger technique a 5 French sheath was placed in the right femoral artery and a 7 French sheath was placed in the right femoral vein. A Swan-Ganz catheter was used for the right heart catheterization. Standard protocol was followed for recording of right heart pressures and sampling of oxygen saturations. Fick cardiac output was calculated. Standard Judkins catheters were used for selective coronary angiography and left ventriculography. There were no immediate procedural complications. The patient was transferred to the post catheterization recovery area for further monitoring.  Procedural Findings: Hemodynamics RA 7/5 mean 4 mm Hg RV 32/8 mm Hg PA 24/11 mm Hg PCWP 13/12 mean 10 mm Hg LV 154/8 mm Hg AO 115/442 mean 67 mm Hg.  AV mean gradient: 31 mm Hg, peak to peak gradient: 39 mm Hg. AV area: 0.9 cm2 with index 0.6  Oxygen saturations: PA 71% AO 93%  Cardiac Output (Fick) 4.82 L/min   Cardiac Index (Fick) 3.11 L/min/m2     Coronary angiography: Coronary dominance: codominant  Left mainstem: Normal  Left anterior descending (LAD): Mild disease in the proximal vessel to 30%. The first diagonal is normal.  Left circumflex (LCx): Very large codominant vessel with multiple OM branches. Normal.  Right coronary artery (RCA): Normal.  Left ventriculography: Hand injection only. Left ventricular systolic function is normal, LVEF is estimated at 55-65%, there is no significant mitral regurgitation. The aortic valve and mitral annulus are heavily calcified   Final Conclusions:    1. Mild nonobstructive CAD 2. Normal LV  function. 3. Normal right heart pressures 4. Severe Aortic stenosis.  Recommendations: Will review Echo data. LVEF appears much better by cath data.    Theron Arista [facility] 05/25/2013, 2:04 PM      Cardiac TAVR CT  TECHNIQUE:  The patient was scanned on a Philips 256 scanner. A 120 kV  retrospective scan was triggered in the descending thoracic aorta at  111 HU's. Gantry rotation speed was 270 msecs and collimation was .9  mm. 5 mg of iv Metoprolol and no iv nitro were given. The 3D data  set was reconstructed in 5% intervals of the R-R cycle. Systolic and  diastolic phases were analyzed on a dedicated work station using  MPR, MIP and VRT modes. The patient received 80 cc of contrast.  FINDINGS:  Aortic Valve: Trileaflet,  Aorta: Normal size aortic root and thoracic aorta. Mild  calcifications around the origin of brachiocephalic arteries. No  dissection.  Sinotubular Junction: 29 x 27 mm  Ascending Thoracic Aorta: 37 x 36 mm  Aortic Arch: 30 x 30 mm  Descending Thoracic Aorta: 24 x 24 mm  Sinus of Valsalva Measurements:  Non-coronary: 28 mm  Right -coronary: 28 mm  Left -coronary: 30 mm  Coronary Artery Height above Annulus:  Left Main: 9 mm  Right Coronary: 9 mm  Virtual Basal Annulus Measurements:  Maximum/Minimum Diameter: 24 x 22 mm  Perimeter: 76 mm  Area: 403 mm2  Coronary Arteries: Originating in normal position. The study wasn't  designed for coronary arteries evaluation (no use of nitroglycerin).  Optimum Fluoroscopic Angle for Delivery: LAO 7 CAU 7  IMPRESSION:  1. Mildly calcified trileaflet aortic valve with limited leaflet  opening and annular size suitable for delivery of 23 mm TAVR  Edward-Sapien XT valve.  2. Sufficient coronaries to annular distance.  3. Optimum fluoroscopic angle for delivery LAO 7 CAU 7.  4. Sufficient sheath to tip distance (67 mm).  Tobias Alexander  Electronically Signed  By: Tobias Alexander  On: 01/06/2014 14:12        Study Result    EXAM:  OVER-READ INTERPRETATION CT CHEST  The following report is an over-read performed by radiologist Dr.  [facility] on 01/06/2014. This  over-read does not include interpretation of cardiac or coronary  anatomy or pathology. The coronary calcium score/coronary CTA  interpretation by the cardiologist is attached.  COMPARISON: Chest CT 07/11/2007.  FINDINGS:  Within the visualized portions of the thorax there is no enlarged  mediastinal or hilar lymph nodes. Small calcified granuloma in the  apex of the right upper lobe. No other suspicious appearing  pulmonary nodules or masses are noted in the visualized portions of  the lungs. No  acute consolidative airspace disease. No pleural  effusions. No pneumothorax. Pectus excavatum. Esophagus is  unremarkable in appearance. Heterogeneous appearance of the thyroid  gland, with multiple nodules, largest of which extends from the  inferior aspect of the isthmus measuring 2.0 x 1.6 cm.  IMPRESSION:  1. Heterogeneous appearance of the thyroid gland with multiple small  nodules, largest of which measures 2.0 x 1.6 cm in the region of the  isthmus. Further evaluation with nonemergent thyroid ultrasound in  the near future is recommended to better characterize these  findings.  2. Pectus excavatum.  Electronically Signed:  By: Trudie Reed M.D.  On: 01/06/2014 12:15      CTA ABDOMEN AND PELVIS WITH CONTRAST  TECHNIQUE:  Multidetector CT imaging of the abdomen and pelvis was performed  using the standard protocol during bolus administration of  intravenous contrast. Multiplanar reconstructed images and MIPs were  obtained and reviewed to evaluate the vascular anatomy.  CONTRAST: 80mL OMNIPAQUE IOHEXOL 350 MG/ML SOLN  COMPARISON: CT of the abdomen and pelvis 08/31/2007.  FINDINGS:  CT ABDOMEN AND PELVIS FINDINGS  Abdomen/Pelvis: Again noted are multiple well-defined  low-attenuation  lesions in the liver, similar in size, number and  distribution to the prior examination 08/31/2007, compatible with  multifocal cysts. The largest of these currently measures 3.0 cm in  segment 2. One of the larger cysts noted on the prior study in the  inferior aspect of the right hepatic lobe has significantly  involuted, currently measuring only 1.3 cm in diameter, slightly  intermediate in attenuation (likely with proteinaceous contents). No  suspicious hepatic lesions are noted.  The appearance of the gallbladder, spleen and bilateral adrenal  glands is unremarkable. There appears to be side branch ductal  ectasia in the head and proximal body of the pancreas (best  appreciated on images 78 and 64 of series 501) is what is which is  new compared to the prior examination. The main pancreatic duct is  normal in caliber, as is the common bile duct. Multifocal areas of  cortical thinning in the right kidney, compatible with chronic post  infectious or inflammatory scarring. There is an exophytic 3.4 x 2.7  cm low-attenuation lesion in the lower pole of the left kidney,  compatible with a simple cyst. 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 Importantly, there is marked urothelial thickening of the in  lower pole moiety in the region of the renal pelvis and proximal  ureter, best appreciated on images 88 through 97 of series 501,  concerning for potential upper tract urothelial neoplasm. Associated  with this, there is mild lower pole hydronephrosis in the left  kidney.  No significant volume of ascites. No pneumoperitoneum. No pathologic  distention of small bowel. No lymphadenopathy noted in the abdomen  or pelvis. Normal appendix. Numerous colonic diverticulae are noted,  particularly in the sigmoid colon, without surrounding inflammatory  changes to suggest an acute diverticulitis at this time. Status post   hysterectomy. There is again a large mass in the pelvis measuring  8.6 x 9.9 x 7.0 cm, demonstrating mixed attenuation with  predominantly fatty internal attenuation, of some small amount of  soft tissue component, and extensive coarse calcifications, most  compatible with a teratoma. This is favored to be from the left  ovary.  Musculoskeletal: There are no aggressive appearing lytic or blastic  lesions noted in the visualized portions of the skeleton.  VASCULAR MEASUREMENTS  PERTINENT TO TAVR:  AORTA:  Minimal Aortic Diameter - 10 x 9 mm  Severity of Aortic Calcification - Severe and often times  circumferential  RIGHT PELVIS:  Right Common Iliac Artery -  Minimal Diameter - 6.3 x 8.4 mm  Tortuosity - Moderate  Calcification - Moderate  Right External Iliac Artery -  Minimal Diameter - 6.1 x 6.6 mm  Tortuosity - Mild  Calcification - None  Right Common Femoral Artery -  Minimal Diameter - 6.0 x 7.2 mm  Tortuosity - Mild  Calcification - Mild to moderate  LEFT PELVIS:  Left Common Iliac Artery -  Minimal Diameter - 6.6 x 6.1 mm  Tortuosity - Moderate  Calcification - Moderate  Left External Iliac Artery -  Minimal Diameter - 7.0 x 6.6 mm  Tortuosity - Mild  Calcification - None  Left Common Femoral Artery -  Minimal Diameter - 5.6 x 6.5 mm  Tortuosity - Mild  Calcification - Mild  Review of the MIP images confirms the above findings.  IMPRESSION:  1. Vascular findings and measurements pertinent to potential TAVR  procedure, as above. This patient does appear to have suitable  pelvic arterial access.  2. 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 In the lower pole moiety of the left renal  collecting system and proximal lower pole ureter there is  circumferential urothelial thickening, concerning for potential  upper tract urothelial neoplasm. Urologic consultation is  recommended for further evaluation.  3. Large teratoma  in the pelvis redemonstrated.  4. New side branch ectasia in the head and proximal body of the  pancreas. This is nonspecific, and could relate to prior episodes of  pancreatitis. However, the possibility of a slow-growing neoplasm  such is intraductal papillary mucinous neoplasm (IPMN) is not  excluded. Followup evaluation with MRI of the abdomen with and  without IV gadolinium and MRCP in 6 months is recommended to ensure  the stability of these findings.  5. Additional incidental findings, as above.  These results will be called to the ordering clinician or  representative by the Radiologist Assistant, and communication  documented in the PACS or zVision Dashboard.  Electronically Signed  By: Trudie Reed M.D.  On: 01/06/2014 13:30    STS Risk Calculator  Procedure                                         AVR  Risk of Mortality                                4.83% Morbidity or Mortality                       20.4% Prolonged LOS                                   10.0% Short LOS                                           18.7% Permanent Stroke  2.4% Prolonged Vent Support                      14.0% DSW Infection                                     0.2% Renal Failure                                       3.9% Reoperation                                        9.6%    Impression:  Patient has stage D severe symptomatic aortic stenosis with history of multiple syncopal episodes. Left ventricular function is only mildly reduced and diagnostic cardiac catheterization performed last January demonstrates only moderate nonobstructive coronary artery disease. The patient has had recurrent paroxysmal atrial fibrillation, but event monitors have not demonstrated any episodes of bradycardia which might explain the patient's syncope. I have personally reviewed the patient's transthoracic echocardiograms from January and August of this year. The patient clearly has  severe aortic stenosis with heavy calcification and severe leaflet restriction involving all 3 leaflets of the aortic valve.  Peak velocity across the aortic valve measured in excess of 4 m/s at the time of the echocardiogram performed last January. The most recent echocardiogram was notable for a very limited assessment of the transvalvular gradient.  Although the patient has otherwise remained reasonably active and relatively healthy for most of her life, she has developed progressive dementia over the last few years. Although her dementia is clearly not severe, it it has progressed to the point where it would unquestionably affect her ability to recover from surgery. Risks associated with conventional surgical aortic valve replacement would only be moderately elevated using traditional risk models such as the STS risk calculator. However, I feel that risks associated with conventional surgery would be much higher because of the patient's advanced age and underlying dementia.  Transcatheter aortic valve replacement might prove to be a reasonable alternative to high-risk conventional surgery.   Plan:  The patient, her husband and daughter were counseled at length regarding treatment alternatives for management of severe symptomatic aortic stenosis.  Alternative approaches such as conventional aortic valve replacement, transcatheter aortic valve replacement, and palliative medical therapy were compared and contrasted at length.  The risks associated with conventional surgical aortic valve replacement were been discussed in detail, as were expectations for post-operative convalescence. Long-term prognosis with medical therapy and the natural history of aortic stenosis were discussed. This discussion was placed in the context of the patient's own specific clinical presentation and past medical history.  All of their questions been addressed.   During the course of the patient's preoperative work up they have  been evaluated comprehensively by a multidisciplinary team of specialists coordinated through the Multidisciplinary Heart Valve Clinic in the [facility].  They have been demonstrated to suffer from symptomatic severe aortic stenosis as noted above. The patient has been counseled extensively as to the relative risks and benefits of all options for the treatment of severe aortic stenosis including long term medical therapy, conventional surgery for aortic valve replacement, and transcatheter aortic valve replacement.  The  patient has been independently evaluated by two cardiac surgeons including myself and Dr. Laneta Simmers, and both of [place] feel that the patient would be a poor candidate for conventional surgery (predicted risk of mortality >15% and/or predicted risk of permanent morbidity >50%).    Based upon review of all of the patient's preoperative diagnostic tests they are felt to be candidate for transcatheter aortic valve replacement using the transfemoral approach as an alternative to high risk conventional surgery.    Following the decision to proceed with transcatheter aortic valve replacement, a discussion has been held regarding what types of management strategies would be attempted intraoperatively in the event of life-threatening complications, including whether or not the patient would be considered a candidate for the use of cardiopulmonary bypass and/or conversion to open sternotomy for attempted surgical intervention.   The patient specifically requests that we would use cardiopulmonary bypass and/or proceed with emergent median sternotomy for surgical repair if the patient were to develop technical complications that are felt to be potentially salvageable using conventional surgical techniques. The patient has been advised of a variety of complications that might develop including but not limited to risks of death, stroke, paravalvular leak, aortic dissection or other major  vascular complications, aortic annulus rupture, device embolization, cardiac rupture or perforation, mitral regurgitation, acute myocardial infarction, arrhythmia, heart block or bradycardia requiring permanent pacemaker placement, congestive heart failure, respiratory failure, renal failure, pneumonia, infection, other late complications related to structural valve deterioration or migration, or other complications that might ultimately cause a temporary or permanent loss of functional independence or other long term morbidity.  The patient provides full informed consent for the procedure as described and all questions were answered.    OWEN,CLARENCE H

## 2014-02-01 ENCOUNTER — Encounter (HOSPITAL_COMMUNITY): Payer: Self-pay | Admitting: Anesthesiology

## 2014-02-01 ENCOUNTER — Encounter: Payer: Self-pay | Admitting: Thoracic Surgery (Cardiothoracic Vascular Surgery)

## 2014-02-01 ENCOUNTER — Ambulatory Visit (INDEPENDENT_AMBULATORY_CARE_PROVIDER_SITE_OTHER): Payer: Medicare Other | Admitting: Thoracic Surgery (Cardiothoracic Vascular Surgery)

## 2014-02-01 VITALS — BP 144/84 | HR 81 | Ht 63.0 in | Wt 123.0 lb

## 2014-02-01 DIAGNOSIS — I350 Nonrheumatic aortic (valve) stenosis: Secondary | ICD-10-CM

## 2014-02-01 MED ORDER — SODIUM CHLORIDE 0.9 % IV SOLN
INTRAVENOUS | Status: DC
  Filled 2014-02-01: qty 30

## 2014-02-01 MED ORDER — NITROGLYCERIN IN D5W 200-5 MCG/ML-% IV SOLN
2.0000 ug/min | INTRAVENOUS | Status: DC
  Filled 2014-02-01: qty 250

## 2014-02-01 MED ORDER — POTASSIUM CHLORIDE 2 MEQ/ML IV SOLN
80.0000 meq | INTRAVENOUS | Status: DC
  Filled 2014-02-01: qty 40

## 2014-02-01 MED ORDER — NOREPINEPHRINE BITARTRATE 1 MG/ML IV SOLN
0.0000 ug/min | INTRAVENOUS | Status: DC
  Filled 2014-02-01: qty 4

## 2014-02-01 MED ORDER — DOPAMINE-DEXTROSE 3.2-5 MG/ML-% IV SOLN
2.0000 ug/kg/min | INTRAVENOUS | Status: DC
  Filled 2014-02-01: qty 250

## 2014-02-01 MED ORDER — VANCOMYCIN HCL 10 G IV SOLR
1250.0000 mg | INTRAVENOUS | Status: AC
  Administered 2014-02-02: 1250 mg via INTRAVENOUS
  Filled 2014-02-01: qty 1250

## 2014-02-01 MED ORDER — DEXMEDETOMIDINE HCL IN NACL 400 MCG/100ML IV SOLN
0.1000 ug/kg/h | INTRAVENOUS | Status: DC
  Filled 2014-02-01: qty 100

## 2014-02-01 MED ORDER — MAGNESIUM SULFATE 50 % IJ SOLN
40.0000 meq | INTRAMUSCULAR | Status: DC
  Filled 2014-02-01: qty 10

## 2014-02-01 MED ORDER — EPINEPHRINE HCL 1 MG/ML IJ SOLN
0.5000 ug/min | INTRAVENOUS | Status: DC
  Filled 2014-02-01: qty 4

## 2014-02-01 MED ORDER — SODIUM CHLORIDE 0.9 % IV SOLN
INTRAVENOUS | Status: DC
  Filled 2014-02-01: qty 2.5

## 2014-02-01 MED ORDER — DEXTROSE 5 % IV SOLN
1.5000 g | INTRAVENOUS | Status: AC
  Administered 2014-02-02: 1.5 g via INTRAVENOUS
  Filled 2014-02-01: qty 1.5

## 2014-02-01 MED ORDER — PHENYLEPHRINE HCL 10 MG/ML IJ SOLN
30.0000 ug/min | INTRAVENOUS | Status: DC
  Filled 2014-02-01: qty 2

## 2014-02-01 NOTE — Progress Notes (Signed)
[address]       [place] [address]             [phone]     CARDIOTHORACIC SURGERY OFFICE NOTE  Referring Provider is Tonny Bollman, MD PCP is  [name], MD   HPI:  The patient returns for followup of severe symptomatic aortic stenosis.  Since then she has been evaluated comprehensively by Dr. Excell Seltzer and a multidisciplinary team of specialists coordinated through the [facility] in the [facility].    The patient has been independently evaluated by two cardiac surgeons including myself and Dr. Laneta Simmers, and both of [place] feel that the patient would be a poor candidate for conventional surgery (predicted risk of mortality >15% and/or predicted risk of permanent morbidity >50%) because of advanced age and underlying dementia.  Based upon review of all of the patient's preoperative diagnostic tests she is felt to be candidate for transcatheter aortic valve replacement using the transfemoral approach as an alternative to high risk conventional surgery.  She returns to the office today with plans to proceed with surgery tomorrow.  She reports no new problems or complaints over the past month. She denies any chest pain or shortness of breath.  She has not had any further episodes of syncope or near syncope. She is accompanied to the office today by her daughter and husband. She is eager to proceed with surgery.    Current Outpatient Prescriptions  Medication Sig Dispense Refill  . atorvastatin (LIPITOR) 20 MG tablet Take 20 mg by mouth daily.      [name] Besifloxacin HCl (BESIVANCE) 0.6 % SUSP Place 1 drop into the right eye 3 (three) times daily. Only use for 3 days after eye injections      . calcium citrate (CALCITRATE - DOSED IN MG ELEMENTAL CALCIUM) 950 MG tablet Take 1 tablet by mouth daily.      [name] donepezil (ARICEPT) 10 MG tablet Take 1 tablet (10 mg total) by mouth daily.  30 tablet  12  . Multiple Vitamin  (MULTIVITAMIN) capsule Take 1 capsule by mouth daily.      [name] NIFEdipine (PROCARDIA XL/ADALAT-CC) 90 MG 24 hr tablet Take 90 mg by mouth daily.      [name] warfarin (COUMADIN) 2.5 MG tablet Take 2.5-3.75 mg by mouth daily. Take 3.75 mg every Monday & thurs then take 2.5 mg the rest of the week       No current facility-administered medications for this visit.   Facility-Administered Medications Ordered in Other Visits  Medication Dose Route Frequency Provider Last Rate Last Dose  . [START ON 02/02/2014] cefUROXime (ZINACEF) 1.5 g in dextrose 5 % 50 mL IVPB  1.5 g Intravenous To OR Tonny Bollman, MD      . Melene Muller ON 02/02/2014] dexmedetomidine (PRECEDEX) 400 MCG/100ML (4 mcg/mL) infusion  0.1-0.7 mcg/kg/hr Intravenous To OR Tonny Bollman, MD      . Melene Muller ON 02/02/2014] DOPamine (INTROPIN) 800 mg in dextrose 5 % 250 mL (3.2 mg/mL) infusion  2-20 mcg/kg/min Intravenous To OR Tonny Bollman, MD      . Melene Muller ON 02/02/2014] EPINEPHrine (ADRENALIN) 4 mg in dextrose 5 % 250 mL (0.016 mg/mL) infusion  0.5-20 mcg/min Intravenous To OR Tonny Bollman, MD      . Melene Muller ON 02/02/2014] heparin 30,000 units/NS 1000 mL solution for CELLSAVER   Other To OR Tonny Bollman, MD      . Melene Muller ON 02/02/2014] insulin  regular (NOVOLIN R,HUMULIN R) 250 Units in sodium chloride 0.9 % 250 mL (1 Units/mL) infusion   Intravenous To OR Tonny Bollman, MD      . Melene Muller ON 02/02/2014] magnesium sulfate (IV Push/IM) injection 40 mEq  40 mEq Other To OR Tonny Bollman, MD      . Melene Muller ON 02/02/2014] nitroGLYCERIN 50 mg in dextrose 5 % 250 mL (0.2 mg/mL) infusion  2-200 mcg/min Intravenous To OR Tonny Bollman, MD      . Melene Muller ON 02/02/2014] norepinephrine (LEVOPHED) 4 mg in dextrose 5 % 250 mL infusion  0-10 mcg/min Intravenous To OR Tonny Bollman, MD      . Melene Muller ON 02/02/2014] phenylephrine (NEO-SYNEPHRINE) 20 mg in dextrose 5 % 250 mL (0.08 mg/mL) infusion  30-200 mcg/min Intravenous To OR Tonny Bollman, MD      . Melene Muller ON 02/02/2014]  potassium chloride injection 80 mEq  80 mEq Other To OR Tonny Bollman, MD      . Melene Muller ON 02/02/2014] vancomycin (VANCOCIN) 1,250 mg in sodium chloride 0.9 % 250 mL IVPB  1,250 mg Intravenous To OR Tonny Bollman, MD          Physical Exam:   BP 144/84  Pulse 81  Ht 5\' 3"  (1.6 m)  Wt 123 lb (55.792 kg)  BMI 21.79 kg/m2  SpO2 97%  General:  Elderly and frail but well appearing  Chest:   clear  CV:   RRR w/ systolic murmur  Incisions:  n/a  Abdomen:  soft  Extremities:  warm  Diagnostic Tests:  Cardiac TAVR CT  TECHNIQUE:   The patient was scanned on a Philips 256 scanner. A 120 kV   retrospective scan was triggered in the descending thoracic aorta at   111 HU's. Gantry rotation speed was 270 msecs and collimation was .9   mm. 5 mg of iv Metoprolol and no iv nitro were given. The 3D data   set was reconstructed in 5% intervals of the R-R cycle. Systolic and   diastolic phases were analyzed on a dedicated work station using   MPR, MIP and VRT modes. The patient received 80 cc of contrast.   FINDINGS:   Aortic Valve: Trileaflet,   Aorta: Normal size aortic root and thoracic aorta. Mild   calcifications around the origin of brachiocephalic arteries. No   dissection.   Sinotubular Junction: 29 x 27 mm   Ascending Thoracic Aorta: 37 x 36 mm   Aortic Arch: 30 x 30 mm   Descending Thoracic Aorta: 24 x 24 mm   Sinus of Valsalva Measurements:   Non-coronary: 28 mm   Right -coronary: 28 mm   Left -coronary: 30 mm   Coronary Artery Height above Annulus:   Left Main: 9 mm   Right Coronary: 9 mm   Virtual Basal Annulus Measurements:   Maximum/Minimum Diameter: 24 x 22 mm   Perimeter: 76 mm   Area: 403 mm2   Coronary Arteries: Originating in normal position. The study wasn't   designed for coronary arteries evaluation (no use of nitroglycerin).   Optimum Fluoroscopic Angle for Delivery: LAO 7 CAU 7   IMPRESSION:   1. Mildly calcified trileaflet aortic valve with limited  leaflet   opening and annular size suitable for delivery of 23 mm TAVR   Edward-Sapien XT valve.   2. Sufficient coronaries to annular distance.   3. Optimum fluoroscopic angle for delivery LAO 7 CAU 7.   4. Sufficient sheath to tip distance (67 mm).  Tobias Alexander   Electronically Signed   By: Tobias Alexander   On: 01/06/2014 14:12             Study Result        EXAM:    OVER-READ INTERPRETATION CT CHEST   The following report is an over-read performed by radiologist Dr.   Royal Piedra [facility] on 01/06/2014. This   over-read does not include interpretation of cardiac or coronary   anatomy or pathology. The coronary calcium score/coronary CTA   interpretation by the cardiologist is attached.   COMPARISON: Chest CT 07/11/2007.   FINDINGS:   Within the visualized portions of the thorax there is no enlarged   mediastinal or hilar lymph nodes. Small calcified granuloma in the   apex of the right upper lobe. No other suspicious appearing   pulmonary nodules or masses are noted in the visualized portions of   the lungs. No acute consolidative airspace disease. No pleural   effusions. No pneumothorax. Pectus excavatum. Esophagus is   unremarkable in appearance. Heterogeneous appearance of the thyroid   gland, with multiple nodules, largest of which extends from the   inferior aspect of the isthmus measuring 2.0 x 1.6 cm.   IMPRESSION:   1. Heterogeneous appearance of the thyroid gland with multiple small   nodules, largest of which measures 2.0 x 1.6 cm in the region of the   isthmus. Further evaluation with nonemergent thyroid ultrasound in   the near future is recommended to better characterize these   findings.   2. Pectus excavatum.   Electronically Signed:   By: Trudie Reed M.D.   On: 01/06/2014 12:15       CTA ABDOMEN AND PELVIS WITH CONTRAST  TECHNIQUE:   Multidetector CT imaging of the abdomen and pelvis was performed   using the standard  protocol during bolus administration of   intravenous contrast. Multiplanar reconstructed images and MIPs were   obtained and reviewed to evaluate the vascular anatomy.   CONTRAST: 80mL OMNIPAQUE IOHEXOL 350 MG/ML SOLN   COMPARISON: CT of the abdomen and pelvis 08/31/2007.   FINDINGS:   CT ABDOMEN AND PELVIS FINDINGS   Abdomen/Pelvis: Again noted are multiple well-defined   low-attenuation lesions in the liver, similar in size, number and   distribution to the prior examination 08/31/2007, compatible with   multifocal cysts. The largest of these currently measures 3.0 cm in   segment 2. One of the larger cysts noted on the prior study in the   inferior aspect of the right hepatic lobe has significantly   involuted, currently measuring only 1.3 cm in diameter, slightly   intermediate in attenuation (likely with proteinaceous contents). No   suspicious hepatic lesions are noted.   The appearance of the gallbladder, spleen and bilateral adrenal   glands is unremarkable. There appears to be side branch ductal   ectasia in the head and proximal body of the pancreas (best   appreciated on images 78 and 64 of series 501) is what is which is   new compared to the prior examination. The main pancreatic duct is   normal in caliber, as is the common bile duct. Multifocal areas of   cortical thinning in the right kidney, compatible with chronic post   infectious or inflammatory scarring. There is an exophytic 3.4 x 2.7   cm low-attenuation lesion in the lower pole of the left kidney,   compatible with a simple cyst. 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 Importantly, there is marked urothelial thickening of the in   lower pole moiety in the region of the renal pelvis and proximal   ureter, best appreciated on images 88 through 97 of series 501,   concerning for potential upper tract urothelial neoplasm. Associated   with this,  there is mild lower pole hydronephrosis in the left   kidney.   No significant volume of ascites. No pneumoperitoneum. No pathologic   distention of small bowel. No lymphadenopathy noted in the abdomen   or pelvis. Normal appendix. Numerous colonic diverticulae are noted,   particularly in the sigmoid colon, without surrounding inflammatory   changes to suggest an acute diverticulitis at this time. Status post   hysterectomy. There is again a large mass in the pelvis measuring   8.6 x 9.9 x 7.0 cm, demonstrating mixed attenuation with   predominantly fatty internal attenuation, of some small amount of   soft tissue component, and extensive coarse calcifications, most   compatible with a teratoma. This is favored to be from the left   ovary.   Musculoskeletal: There are no aggressive appearing lytic or blastic   lesions noted in the visualized portions of the skeleton.   VASCULAR MEASUREMENTS PERTINENT TO TAVR:   AORTA:   Minimal Aortic Diameter - 10 x 9 mm   Severity of Aortic Calcification - Severe and often times   circumferential   RIGHT PELVIS:   Right Common Iliac Artery -   Minimal Diameter - 6.3 x 8.4 mm   Tortuosity - Moderate   Calcification - Moderate   Right External Iliac Artery -   Minimal Diameter - 6.1 x 6.6 mm   Tortuosity - Mild   Calcification - None   Right Common Femoral Artery -   Minimal Diameter - 6.0 x 7.2 mm   Tortuosity - Mild   Calcification - Mild to moderate   LEFT PELVIS:   Left Common Iliac Artery -   Minimal Diameter - 6.6 x 6.1 mm   Tortuosity - Moderate   Calcification - Moderate   Left External Iliac Artery -   Minimal Diameter - 7.0 x 6.6 mm   Tortuosity - Mild   Calcification - None   Left Common Femoral Artery -   Minimal Diameter - 5.6 x 6.5 mm   Tortuosity - Mild   Calcification - Mild   Review of the MIP images confirms the above findings.   IMPRESSION:   1. Vascular findings and measurements pertinent to potential TAVR     procedure, as above. This patient does appear to have suitable   pelvic arterial access.   2. 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 In the lower pole moiety of the left renal   collecting system and proximal lower pole ureter there is   circumferential urothelial thickening, concerning for potential   upper tract urothelial neoplasm. Urologic consultation is   recommended for further evaluation.   3. Large teratoma in the pelvis redemonstrated.   4. New side branch ectasia in the head and proximal body of the   pancreas. This is nonspecific, and could relate to prior episodes of   pancreatitis. However, the possibility of a slow-growing neoplasm   such is intraductal papillary mucinous neoplasm (IPMN) is not   excluded. Followup evaluation with MRI of the abdomen with and   without IV gadolinium  and MRCP in 6 months is recommended to ensure   the stability of these findings.   5. Additional incidental findings, as above.   These results will be called to the ordering clinician or   representative by the Radiologist Assistant, and communication   documented in the PACS or zVision Dashboard.   Electronically Signed   By: Trudie Reed M.D.   On: 01/06/2014 13:30    STS Risk Calculator  Procedure                                         AVR  Risk of Mortality                                4.83% Morbidity or Mortality                       20.4% Prolonged LOS                                   10.0% Short LOS                                           18.7% Permanent Stroke                            2.4% Prolonged Vent Support                      14.0% DSW Infection                                     0.2% Renal Failure                                       3.9% Reoperation                                        9.6%    Impression:  Patient has stage D severe symptomatic aortic stenosis with history of multiple syncopal  episodes. Left ventricular function is only mildly reduced and diagnostic cardiac catheterization performed last January demonstrates only moderate nonobstructive coronary artery disease. The patient has had recurrent paroxysmal atrial fibrillation, but event monitors have not demonstrated any episodes of bradycardia which might explain the patient's syncope. I have personally reviewed the patient's transthoracic echocardiograms from January and August of this year. The patient clearly has severe aortic stenosis with heavy calcification and severe leaflet restriction involving all 3 leaflets of the aortic valve. Peak velocity across the aortic valve measured in excess of 4 m/s at the time of the echocardiogram performed last January. The most recent echocardiogram was notable for a very limited assessment of the transvalvular gradient. Although the patient has otherwise remained reasonably active and relatively healthy for most of her life, she has developed progressive dementia over the last  few years. Although her dementia is clearly not severe, it it has progressed to the point where it would unquestionably affect her ability to recover from surgery. Risks associated with conventional surgical aortic valve replacement would only be moderately elevated using traditional risk models such as the STS risk calculator. However, I feel that risks associated with conventional surgery would be much higher because of the patient's advanced age and underlying dementia. Transcatheter aortic valve replacement might prove to be a reasonable alternative to high-risk conventional surgery.   Plan:  Following the decision to proceed with transcatheter aortic valve replacement, a discussion has been held regarding what types of management strategies would be attempted intraoperatively in the event of life-threatening complications, including whether or not the patient would be considered a candidate for the use of  cardiopulmonary bypass and/or conversion to open sternotomy for attempted surgical intervention.  The patient specifically requests that we would use cardiopulmonary bypass and/or proceed with emergent median sternotomy for surgical repair if the patient were to develop technical complications that are felt to be potentially salvageable using conventional surgical techniques.  The patient has been advised of a variety of complications that might develop including but not limited to risks of death, stroke, paravalvular leak, aortic dissection or other major vascular complications, aortic annulus rupture, device embolization, cardiac rupture or perforation, mitral regurgitation, acute myocardial infarction, arrhythmia, heart block or bradycardia requiring permanent pacemaker placement, congestive heart failure, respiratory failure, renal failure, pneumonia, infection, other late complications related to structural valve deterioration or migration, or other complications that might ultimately cause a temporary or permanent loss of functional independence or other long term morbidity.  The patient provides full informed consent for the procedure as described and all questions were answered.   I spent in excess of 15 minutes during the conduct of this office consultation and >50% of this time involved direct face-to-face encounter with the patient for counseling and/or coordination of their care.  Salvatore Decentlarence H. Cornelius Moraswen, MD 02/01/2014 7:48 PM

## 2014-02-01 NOTE — Patient Instructions (Signed)
Nothing to eat or drink after midnight tonight  Do not take any of your regularly scheduled medications tomorrow morning

## 2014-02-01 NOTE — Progress Notes (Signed)
Anesthesia Chart Review:  Patient is a 80 year old female scheduled for TAVR, transfemoral approach on 02/02/14 by Dr. Excell Seltzerooper. He is also meeting with CT surgeon Dr. Cornelius Moraswen, with H&P in Epic and appointment scheduled with him late this afternoon.  History includes severe symptomatic AS, paroxsymal afib (on warfarin), syncope '14 and 11/2013, HTN, hypercholesterolemia, "wet" macular degeneration right eye, dementia, tonsillectomy. Of note, CT on 01/06/14 showed circumferential urothelial thickening in teh power pole moiety of the left renal collecting system and proximal lower pole ureter concerning for potential upper tract urothelial neoplasm. Dr. Excell Seltzerooper discussed results with urologist Dr. Vernie Ammonsttelin with plan to proceed with TAVR then work-up for potential urologic malignancy after she recovers. PCP is Dr. Duane LopeAlan Ross.  Primary cardiologist is Dr. Peter [place]. Her Coumadin was held starting 01/28/14.  EKG on 01/29/14 showed NSR, LAD, left BBB.  EKG was felt stable since 12/04/13. 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 There was moderate concentric hypertrophy. Systolic function was normal. The estimated ejection fraction was in the range of 55% to 60%. Incoordinate septal motion. Doppler parameters are consistent with abnormal left ventricular relaxation (grade 1 diastolic dysfunction). The E/e&' ratio is >15, suggesting elevated LV filling pressure. - Aortic valve: The aortic valve is heavily calcified with restricted leaflet motion. Peak and mean gradients of 56 mmHg and 32 mmHg, respectively. Based on an LVOT diameter of 1.8 cm, the calculated AVA is 0.7-0.8 cm2. Consistent with severe aortic stenosis. The aortic valve gradient is probably undersampled. Valve area (VTI): 0.7 cm^2. Valve area (Vmax): 0.76 cm^2. - Mitral valve: Heavily calcified mitral annulus. There is mild mitral stenosis. Mild regurgitation. Valve area by  continuity equation (using LVOT flow): 1.59 cm^2. - Left atrium: Moderately dilated (42 ml/m2). Impressions: Compared to the prior echo in 04/2013, there has been an improvement in EF to 55-60%. There is still severe aortic stenosis, however, gradients are undersampled during this study. There is probably mild mitral stenosis as well.  Holter monitor from 12/05/13-01/03/14 showed: a-fib present on 1 day but unable to calculate AF burden due to artifact or rhythm.  Cardiac cath on 05/25/13 showed:  Final Conclusions:  1. Mild nonobstructive CAD (30% proximal LAD, otherwise normal). 2. Normal LV function.  3. Normal right heart pressures  4. Severe Aortic stenosis.  Carotid duplex on 01/29/14 showed: No significant extracranial carotid artery stenosis demonstrated. Vertebrals are patent with antegrade flow.  Cardiac CTA on 01/07/04 showed:  1. Mildly calcified trileaflet aortic valve with limited leaflet opening and annular size suitable for delivery of 23 mm TAVR Edward-Sapien XT valve.  2. Sufficient coronaries to annular distance.  3. Optimum fluoroscopic angle for delivery LAO 7 CAU 7.  4. Sufficient sheath to tip distance (67 mm).  5. Heterogeneous appearance of the thyroid gland with multiple small nodules, largest of which measures 2.0 x 1.6 cm in the region of the isthmus. Further evaluation with nonemergent thyroid ultrasound in the near future is recommended to better characterize these findings.  5. Pectus excavatum.  CTA of the abdomen/pelvis on 01/06/14 showed: 1. Vascular findings and measurements pertinent to potential TAVR procedure, as above. This patient does appear to have suitable pelvic arterial access.  2. 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 In the lower pole moiety of the left renal collecting system and proximal lower pole ureter there is circumferential urothelial  thickening, concerning for potential upper tract urothelial  neoplasm. Urologic consultation is recommended for further evaluation.  3. Large teratoma in the pelvis redemonstrated.  4. New side branch ectasia in the head and proximal body of the pancreas. This is nonspecific, and could relate to prior episodes of pancreatitis. However, the possibility of a slow-growing neoplasm such is intraductal papillary mucinous neoplasm (IPMN) is not excluded. Followup evaluation with MRI of the abdomen with and without IV gadolinium and MRCP in 6 months is recommended to ensure the stability of these findings.  5. Additional incidental findings, as above. (See full report under Results Review tab.) These results will be called to the ordering clinician or representative by the Radiologist Assistant, and communication documented in the PACS or zVision Dashboard.   CXR on 01/29/14 showed: Chronic changes without acute abnormality.    PFTs on 01/06/14 showed: FVC 2.23 (99%), FEV1 1.47 (89%), DLCOunc 16.54 (72%).  Preoperative labs noted.  Cr 0.78. H/H 13.9/41.9. PT/INR 16.6/1.34.  PT slightly elevated, but now off warfarin so anticipate it will be WNL by the DOS. Will order repeat PT/INR for the day of surgery. PTT 32. A1C 6.0. T&S done. UA moderate leukocytes, moderate hemoglobin, negative nitrites, few bacteria.  Labs are marked as reviewed by Dr. Cornelius Moras.    If no acute changes then I would anticipate that she could proceed as planned.  Velna Ochs [facility] Short Stay Center/Anesthesiology Phone [phone] 02/01/2014 11:29 AM

## 2014-02-02 ENCOUNTER — Inpatient Hospital Stay (HOSPITAL_COMMUNITY)
Admission: RE | Admit: 2014-02-02 | Discharge: 2014-02-04 | DRG: 267 | Disposition: A | Discharge status: Home / Self Care | Payer: Medicare Other | Source: Ambulatory Visit | Attending: Cardiovascular Disease | Admitting: Cardiovascular Disease

## 2014-02-02 ENCOUNTER — Encounter (HOSPITAL_COMMUNITY): Payer: Self-pay | Admitting: Anesthesiology

## 2014-02-02 ENCOUNTER — Inpatient Hospital Stay (HOSPITAL_COMMUNITY): Payer: Medicare Other

## 2014-02-02 ENCOUNTER — Encounter (HOSPITAL_COMMUNITY)
Admission: RE | Disposition: A | Discharge status: Home / Self Care | Payer: Medicare Other | Source: Ambulatory Visit | Attending: Cardiovascular Disease

## 2014-02-02 ENCOUNTER — Inpatient Hospital Stay (HOSPITAL_COMMUNITY): Payer: Medicare Other | Admitting: Anesthesiology

## 2014-02-02 ENCOUNTER — Encounter (HOSPITAL_COMMUNITY): Payer: Medicare Other | Admitting: Vascular Surgery

## 2014-02-02 DIAGNOSIS — H3532 Exudative age-related macular degeneration: Secondary | ICD-10-CM | POA: Diagnosis present

## 2014-02-02 DIAGNOSIS — Z006 Encounter for examination for normal comparison and control in clinical research program: Secondary | ICD-10-CM | POA: Diagnosis not present

## 2014-02-02 DIAGNOSIS — E876 Hypokalemia: Secondary | ICD-10-CM | POA: Diagnosis not present

## 2014-02-02 DIAGNOSIS — I272 Other secondary pulmonary hypertension: Secondary | ICD-10-CM | POA: Diagnosis present

## 2014-02-02 DIAGNOSIS — I480 Paroxysmal atrial fibrillation: Secondary | ICD-10-CM | POA: Diagnosis present

## 2014-02-02 DIAGNOSIS — Z952 Presence of prosthetic heart valve: Secondary | ICD-10-CM

## 2014-02-02 DIAGNOSIS — Z8249 Family history of ischemic heart disease and other diseases of the circulatory system: Secondary | ICD-10-CM | POA: Diagnosis not present

## 2014-02-02 DIAGNOSIS — E785 Hyperlipidemia, unspecified: Secondary | ICD-10-CM | POA: Diagnosis present

## 2014-02-02 DIAGNOSIS — I447 Left bundle-branch block, unspecified: Secondary | ICD-10-CM | POA: Diagnosis present

## 2014-02-02 DIAGNOSIS — Z7901 Long term (current) use of anticoagulants: Secondary | ICD-10-CM

## 2014-02-02 DIAGNOSIS — F0390 Unspecified dementia without behavioral disturbance: Secondary | ICD-10-CM | POA: Diagnosis present

## 2014-02-02 DIAGNOSIS — J9811 Atelectasis: Secondary | ICD-10-CM | POA: Diagnosis not present

## 2014-02-02 DIAGNOSIS — I2510 Atherosclerotic heart disease of native coronary artery without angina pectoris: Secondary | ICD-10-CM | POA: Diagnosis present

## 2014-02-02 DIAGNOSIS — I10 Essential (primary) hypertension: Secondary | ICD-10-CM | POA: Diagnosis present

## 2014-02-02 DIAGNOSIS — I350 Nonrheumatic aortic (valve) stenosis: Secondary | ICD-10-CM | POA: Diagnosis not present

## 2014-02-02 DIAGNOSIS — Z87891 Personal history of nicotine dependence: Secondary | ICD-10-CM | POA: Diagnosis not present

## 2014-02-02 DIAGNOSIS — D649 Anemia, unspecified: Secondary | ICD-10-CM | POA: Diagnosis not present

## 2014-02-02 DIAGNOSIS — Z954 Presence of other heart-valve replacement: Secondary | ICD-10-CM | POA: Diagnosis not present

## 2014-02-02 DIAGNOSIS — D696 Thrombocytopenia, unspecified: Secondary | ICD-10-CM | POA: Diagnosis not present

## 2014-02-02 DIAGNOSIS — I059 Rheumatic mitral valve disease, unspecified: Secondary | ICD-10-CM | POA: Diagnosis not present

## 2014-02-02 DIAGNOSIS — Z452 Encounter for adjustment and management of vascular access device: Secondary | ICD-10-CM | POA: Diagnosis not present

## 2014-02-02 DIAGNOSIS — I359 Nonrheumatic aortic valve disorder, unspecified: Secondary | ICD-10-CM | POA: Diagnosis not present

## 2014-02-02 HISTORY — DX: Presence of prosthetic heart valve: Z95.2

## 2014-02-02 HISTORY — PX: TRANSCATHETER AORTIC VALVE REPLACEMENT, TRANSFEMORAL: SHX6400

## 2014-02-02 HISTORY — PX: INTRAOPERATIVE TRANSESOPHAGEAL ECHOCARDIOGRAM: SHX5062

## 2014-02-02 LAB — POCT I-STAT, CHEM 8
BUN: 7 mg/dL (ref 6–23)
BUN: 8 mg/dL (ref 6–23)
BUN: 7 mg/dL (ref 6–23)
BUN: 6 mg/dL (ref 6–23)
CALCIUM ION: 1.08 mmol/L — AB (ref 1.13–1.30)
CALCIUM ION: 1.08 mmol/L — AB (ref 1.13–1.30)
CREATININE: 0.4 mg/dL — AB (ref 0.50–1.10)
Calcium, Ion: 1.16 mmol/L (ref 1.13–1.30)
Calcium, Ion: 1.16 mmol/L (ref 1.13–1.30)
Chloride: 101 mEq/L (ref 96–112)
Chloride: 102 mEq/L (ref 96–112)
Chloride: 102 mEq/L (ref 96–112)
Chloride: 105 mEq/L (ref 96–112)
Creatinine, Ser: 0.4 mg/dL — ABNORMAL LOW (ref 0.50–1.10)
Creatinine, Ser: 0.3 mg/dL — ABNORMAL LOW (ref 0.50–1.10)
Creatinine, Ser: 0.7 mg/dL (ref 0.50–1.10)
GLUCOSE: 144 mg/dL — AB (ref 70–99)
GLUCOSE: 146 mg/dL — AB (ref 70–99)
GLUCOSE: 87 mg/dL (ref 70–99)
Glucose, Bld: 123 mg/dL — ABNORMAL HIGH (ref 70–99)
HCT: 36 % (ref 36.0–46.0)
HCT: 34 % — ABNORMAL LOW (ref 36.0–46.0)
HEMATOCRIT: 31 % — AB (ref 36.0–46.0)
HEMATOCRIT: 31 % — AB (ref 36.0–46.0)
HEMOGLOBIN: 10.5 g/dL — AB (ref 12.0–15.0)
HEMOGLOBIN: 12.2 g/dL (ref 12.0–15.0)
HEMOGLOBIN: 11.6 g/dL — AB (ref 12.0–15.0)
Hemoglobin: 10.5 g/dL — ABNORMAL LOW (ref 12.0–15.0)
POTASSIUM: 3.8 meq/L (ref 3.7–5.3)
Potassium: 2.9 mEq/L — CL (ref 3.7–5.3)
Potassium: 3.1 mEq/L — ABNORMAL LOW (ref 3.7–5.3)
Potassium: 3.1 mEq/L — ABNORMAL LOW (ref 3.7–5.3)
SODIUM: 143 meq/L (ref 137–147)
SODIUM: 143 meq/L (ref 137–147)
Sodium: 142 mEq/L (ref 137–147)
Sodium: 142 mEq/L (ref 137–147)
TCO2: 28 mmol/L (ref 0–100)
TCO2: 32 mmol/L (ref 0–100)
TCO2: 27 mmol/L (ref 0–100)
TCO2: 26 mmol/L (ref 0–100)

## 2014-02-02 LAB — PROTIME-INR
INR: 1.12 (ref 0.00–1.49)
INR: 1.3 (ref 0.00–1.49)
PROTHROMBIN TIME: 16.2 s — AB (ref 11.6–15.2)
Prothrombin Time: 14.4 seconds (ref 11.6–15.2)

## 2014-02-02 LAB — CBC
HCT: 34.7 % — ABNORMAL LOW (ref 36.0–46.0)
HEMATOCRIT: 34.2 % — AB (ref 36.0–46.0)
HEMOGLOBIN: 11.3 g/dL — AB (ref 12.0–15.0)
Hemoglobin: 11.3 g/dL — ABNORMAL LOW (ref 12.0–15.0)
MCH: 29.2 pg (ref 26.0–34.0)
MCH: 29.5 pg (ref 26.0–34.0)
MCHC: 32.6 g/dL (ref 30.0–36.0)
MCHC: 33 g/dL (ref 30.0–36.0)
MCV: 89.7 fL (ref 78.0–100.0)
MCV: 89.3 fL (ref 78.0–100.0)
PLATELETS: 164 10*3/uL (ref 150–400)
Platelets: 156 10*3/uL (ref 150–400)
RBC: 3.87 MIL/uL (ref 3.87–5.11)
RBC: 3.83 MIL/uL — ABNORMAL LOW (ref 3.87–5.11)
RDW: 12.9 % (ref 11.5–15.5)
RDW: 13 % (ref 11.5–15.5)
WBC: 8.8 10*3/uL (ref 4.0–10.5)
WBC: 9.4 10*3/uL (ref 4.0–10.5)

## 2014-02-02 LAB — CREATININE, SERUM
Creatinine, Ser: 0.64 mg/dL (ref 0.50–1.10)
GFR calc Af Amer: >90 mL/min (ref >90)
GFR, EST NON AFRICAN AMERICAN: 79 mL/min — AB (ref >90)

## 2014-02-02 LAB — POCT I-STAT 4, (NA,K, GLUC, HGB,HCT)
Glucose, Bld: 85 mg/dL (ref 70–99)
HEMATOCRIT: 34 % — AB (ref 36.0–46.0)
Hemoglobin: 11.6 g/dL — ABNORMAL LOW (ref 12.0–15.0)
Potassium: 3 mEq/L — ABNORMAL LOW (ref 3.7–5.3)
SODIUM: 143 meq/L (ref 137–147)

## 2014-02-02 LAB — POCT I-STAT 3, ART BLOOD GAS (G3+)
ACID-BASE EXCESS: 2 mmol/L (ref 0.0–2.0)
BICARBONATE: 28.9 meq/L — AB (ref 20.0–24.0)
O2 Saturation: 100 %
PH ART: 7.349 — AB (ref 7.350–7.450)
TCO2: 30 mmol/L (ref 0–100)
pCO2 arterial: 52.5 mmHg — ABNORMAL HIGH (ref 35.0–45.0)
pO2, Arterial: 222 mmHg — ABNORMAL HIGH (ref 80.0–100.0)

## 2014-02-02 LAB — GLUCOSE, CAPILLARY
GLUCOSE-CAPILLARY: 104 mg/dL — AB (ref 70–99)
GLUCOSE-CAPILLARY: 116 mg/dL — AB (ref 70–99)
GLUCOSE-CAPILLARY: 120 mg/dL — AB (ref 70–99)
Glucose-Capillary: 84 mg/dL (ref 70–99)

## 2014-02-02 LAB — PREPARE RBC (CROSSMATCH)

## 2014-02-02 LAB — APTT: aPTT: 33 seconds (ref 24–37)

## 2014-02-02 SURGERY — IMPLANTATION, AORTIC VALVE, TRANSCATHETER, FEMORAL APPROACH
Anesthesia: General | Site: Chest

## 2014-02-02 MED ORDER — HEPARIN SODIUM (PORCINE) 1000 UNIT/ML IJ SOLN
INTRAMUSCULAR | Status: DC | PRN
  Administered 2014-02-02: 9 mL via INTRAVENOUS

## 2014-02-02 MED ORDER — MIDAZOLAM HCL 2 MG/2ML IJ SOLN: 2.0000 mg | INTRAMUSCULAR | Status: DC | PRN

## 2014-02-02 MED ORDER — NEOSTIGMINE METHYLSULFATE 10 MG/10ML IV SOLN
INTRAVENOUS | Status: DC | PRN
  Administered 2014-02-02: 4 mg via INTRAVENOUS

## 2014-02-02 MED ORDER — POTASSIUM CHLORIDE 10 MEQ/50ML IV SOLN
10.0000 meq | INTRAVENOUS | Status: AC
  Administered 2014-02-02 (×3): 10 meq via INTRAVENOUS

## 2014-02-02 MED ORDER — TRAMADOL HCL 50 MG PO TABS
50.0000 mg | ORAL_TABLET | ORAL | Status: DC | PRN
  Administered 2014-02-03 (×2): 50 mg via ORAL
  Filled 2014-02-02 (×2): qty 1

## 2014-02-02 MED ORDER — PHENYLEPHRINE 40 MCG/ML (10ML) SYRINGE FOR IV PUSH (FOR BLOOD PRESSURE SUPPORT)
PREFILLED_SYRINGE | INTRAVENOUS | Status: AC
  Filled 2014-02-02: qty 20

## 2014-02-02 MED ORDER — SODIUM CHLORIDE 0.9 % IV SOLN
250.0000 [IU] | INTRAVENOUS | Status: DC | PRN
  Administered 2014-02-02: 2.5 [IU]/h via INTRAVENOUS

## 2014-02-02 MED ORDER — IODIXANOL 320 MG/ML IV SOLN
INTRAVENOUS | Status: DC | PRN
  Administered 2014-02-02: 65 mL via INTRAVENOUS

## 2014-02-02 MED ORDER — SODIUM CHLORIDE 0.9 % IV SOLN
200.0000 ug | INTRAVENOUS | Status: DC | PRN
  Administered 2014-02-02: 0.2 ug/kg/h via INTRAVENOUS

## 2014-02-02 MED ORDER — ALBUMIN HUMAN 5 % IV SOLN
250.0000 mL | INTRAVENOUS | Status: DC | PRN
  Filled 2014-02-02: qty 250

## 2014-02-02 MED ORDER — PROTAMINE SULFATE 10 MG/ML IV SOLN
INTRAVENOUS | Status: AC
  Filled 2014-02-02: qty 25

## 2014-02-02 MED ORDER — SODIUM CHLORIDE 0.9 % IR SOLN
Status: DC | PRN
  Administered 2014-02-02 (×3)

## 2014-02-02 MED ORDER — METOPROLOL TARTRATE 12.5 MG HALF TABLET
12.5000 mg | ORAL_TABLET | Freq: Once | ORAL | Status: AC
  Administered 2014-02-02: 12.5 mg via ORAL
  Filled 2014-02-02: qty 1

## 2014-02-02 MED ORDER — 0.9 % SODIUM CHLORIDE (POUR BTL) OPTIME
TOPICAL | Status: DC | PRN
  Administered 2014-02-02: 6000 mL

## 2014-02-02 MED ORDER — ASPIRIN EC 325 MG PO TBEC
325.0000 mg | DELAYED_RELEASE_TABLET | Freq: Every day | ORAL | Status: DC
  Filled 2014-02-02: qty 1

## 2014-02-02 MED ORDER — CHLORHEXIDINE GLUCONATE 4 % EX LIQD
60.0000 mL | Freq: Once | CUTANEOUS | Status: DC
  Filled 2014-02-02: qty 60

## 2014-02-02 MED ORDER — ACETAMINOPHEN 500 MG PO TABS
1000.0000 mg | ORAL_TABLET | Freq: Four times a day (QID) | ORAL | Status: DC
  Administered 2014-02-03: 1000 mg via ORAL
  Filled 2014-02-02 (×4): qty 2

## 2014-02-02 MED ORDER — FAMOTIDINE IN NACL 20-0.9 MG/50ML-% IV SOLN
20.0000 mg | Freq: Two times a day (BID) | INTRAVENOUS | Status: DC
Start: 2014-02-02 — End: 2014-02-03
  Administered 2014-02-02: 20 mg via INTRAVENOUS

## 2014-02-02 MED ORDER — MIDAZOLAM HCL 2 MG/2ML IJ SOLN: 2.0000 mg | Freq: Once | INTRAMUSCULAR | Status: DC

## 2014-02-02 MED ORDER — ROCURONIUM BROMIDE 50 MG/5ML IV SOLN
INTRAVENOUS | Status: AC
  Filled 2014-02-02: qty 1

## 2014-02-02 MED ORDER — PHENYLEPHRINE HCL 10 MG/ML IJ SOLN: 10.0000 mg | INTRAVENOUS | Status: DC | PRN

## 2014-02-02 MED ORDER — ALBUMIN HUMAN 5 % IV SOLN
INTRAVENOUS | Status: DC | PRN
  Administered 2014-02-02: 12:00:00 via INTRAVENOUS

## 2014-02-02 MED ORDER — FENTANYL CITRATE 0.05 MG/ML IJ SOLN
INTRAMUSCULAR | Status: DC | PRN
Start: 2014-02-02 — End: 2014-02-02
  Administered 2014-02-02: 50 ug via INTRAVENOUS
  Administered 2014-02-02: 100 ug via INTRAVENOUS

## 2014-02-02 MED ORDER — METOPROLOL TARTRATE 1 MG/ML IV SOLN
2.5000 mg | INTRAVENOUS | Status: DC | PRN
Start: 2014-02-02 — End: 2014-02-04

## 2014-02-02 MED ORDER — PROPOFOL 10 MG/ML IV BOLUS
INTRAVENOUS | Status: AC
  Filled 2014-02-02: qty 20

## 2014-02-02 MED ORDER — METOPROLOL TARTRATE 12.5 MG HALF TABLET
12.5000 mg | ORAL_TABLET | Freq: Two times a day (BID) | ORAL | Status: DC
  Filled 2014-02-02 (×3): qty 1

## 2014-02-02 MED ORDER — PROTAMINE SULFATE 10 MG/ML IV SOLN
INTRAVENOUS | Status: DC | PRN
  Administered 2014-02-02: 90 mg via INTRAVENOUS

## 2014-02-02 MED ORDER — PROPOFOL 10 MG/ML IV BOLUS
INTRAVENOUS | Status: DC | PRN
  Administered 2014-02-02: 120 mg via INTRAVENOUS

## 2014-02-02 MED ORDER — LACTATED RINGERS IV SOLN
INTRAVENOUS | Status: DC | PRN
  Administered 2014-02-02 (×2): via INTRAVENOUS

## 2014-02-02 MED ORDER — VANCOMYCIN HCL IN DEXTROSE 1-5 GM/200ML-% IV SOLN
1000.0000 mg | Freq: Once | INTRAVENOUS | Status: AC
  Administered 2014-02-03: 1000 mg via INTRAVENOUS
  Filled 2014-02-02: qty 200

## 2014-02-02 MED ORDER — LACTATED RINGERS IV SOLN: 500.0000 mL | Freq: Once | INTRAVENOUS | Status: AC | PRN

## 2014-02-02 MED ORDER — ONDANSETRON HCL 4 MG/2ML IJ SOLN: 4.0000 mg | Freq: Four times a day (QID) | INTRAMUSCULAR | Status: DC | PRN

## 2014-02-02 MED ORDER — GLYCOPYRROLATE 0.2 MG/ML IJ SOLN
INTRAMUSCULAR | Status: DC | PRN
  Administered 2014-02-02: 0.6 mg via INTRAVENOUS

## 2014-02-02 MED ORDER — LIDOCAINE HCL (CARDIAC) 20 MG/ML IV SOLN
INTRAVENOUS | Status: AC
  Filled 2014-02-02: qty 15

## 2014-02-02 MED ORDER — FENTANYL CITRATE 0.05 MG/ML IJ SOLN
INTRAMUSCULAR | Status: AC
  Administered 2014-02-02: 50 ug
  Filled 2014-02-02: qty 2

## 2014-02-02 MED ORDER — PANTOPRAZOLE SODIUM 40 MG PO TBEC
40.0000 mg | DELAYED_RELEASE_TABLET | Freq: Every day | ORAL | Status: DC
  Administered 2014-02-04: 40 mg via ORAL
  Filled 2014-02-02: qty 1

## 2014-02-02 MED ORDER — SODIUM CHLORIDE 0.9 % IV SOLN
1.0000 mL/kg/h | INTRAVENOUS | Status: AC
  Administered 2014-02-02: 1 mL/kg/h via INTRAVENOUS

## 2014-02-02 MED ORDER — DEXTROSE 5 % IV SOLN
1.5000 g | Freq: Two times a day (BID) | INTRAVENOUS | Status: DC
  Administered 2014-02-03 – 2014-02-04 (×3): 1.5 g via INTRAVENOUS
  Filled 2014-02-02 (×5): qty 1.5

## 2014-02-02 MED ORDER — LIDOCAINE HCL (CARDIAC) 20 MG/ML IV SOLN
INTRAVENOUS | Status: DC | PRN
  Administered 2014-02-02 (×4): 50 mg via INTRAVENOUS

## 2014-02-02 MED ORDER — DEXMEDETOMIDINE HCL IN NACL 200 MCG/50ML IV SOLN: 0.1000 ug/kg/h | INTRAVENOUS | Status: DC

## 2014-02-02 MED ORDER — MIDAZOLAM HCL 2 MG/2ML IJ SOLN
INTRAMUSCULAR | Status: AC
  Administered 2014-02-02: 1 mg
  Filled 2014-02-02: qty 2

## 2014-02-02 MED ORDER — ACETAMINOPHEN 160 MG/5ML PO SOLN: 1000.0000 mg | Freq: Four times a day (QID) | ORAL | Status: DC

## 2014-02-02 MED ORDER — INSULIN ASPART 100 UNIT/ML ~~LOC~~ SOLN
0.0000 [IU] | SUBCUTANEOUS | Status: DC
  Administered 2014-02-02 – 2014-02-03 (×2): 2 [IU] via SUBCUTANEOUS

## 2014-02-02 MED ORDER — POTASSIUM CHLORIDE 10 MEQ/50ML IV SOLN
10.0000 meq | INTRAVENOUS | Status: DC | PRN
  Administered 2014-02-02: 10 meq via INTRAVENOUS
  Filled 2014-02-02: qty 50

## 2014-02-02 MED ORDER — CHLORHEXIDINE GLUCONATE 4 % EX LIQD
30.0000 mL | CUTANEOUS | Status: DC
  Filled 2014-02-02: qty 30

## 2014-02-02 MED ORDER — SODIUM CHLORIDE 0.9 % IV SOLN
INTRAVENOUS | Status: DC
  Filled 2014-02-02: qty 2.5

## 2014-02-02 MED ORDER — ARTIFICIAL TEARS OP OINT
TOPICAL_OINTMENT | OPHTHALMIC | Status: AC
  Filled 2014-02-02: qty 3.5

## 2014-02-02 MED ORDER — POTASSIUM CHLORIDE 10 MEQ/50ML IV SOLN: 10.0000 meq | INTRAVENOUS | Status: DC

## 2014-02-02 MED ORDER — LACTATED RINGERS IV SOLN
INTRAVENOUS | Status: DC
Start: 2014-02-02 — End: 2014-02-02
  Administered 2014-02-02: 10:00:00 via INTRAVENOUS

## 2014-02-02 MED ORDER — ONDANSETRON HCL 4 MG/2ML IJ SOLN
INTRAMUSCULAR | Status: AC
  Filled 2014-02-02: qty 2

## 2014-02-02 MED ORDER — NEOSTIGMINE METHYLSULFATE 10 MG/10ML IV SOLN
INTRAVENOUS | Status: AC
Start: 2014-02-02 — End: 2014-02-02
  Filled 2014-02-02: qty 1

## 2014-02-02 MED ORDER — ATORVASTATIN CALCIUM 20 MG PO TABS
20.0000 mg | ORAL_TABLET | Freq: Every day | ORAL | Status: DC
  Administered 2014-02-03: 20 mg via ORAL
  Filled 2014-02-02 (×3): qty 1

## 2014-02-02 MED ORDER — POTASSIUM CHLORIDE 10 MEQ/50ML IV SOLN
10.0000 meq | INTRAVENOUS | Status: AC
  Administered 2014-02-02: 10 meq via INTRAVENOUS
  Filled 2014-02-02: qty 50

## 2014-02-02 MED ORDER — METOPROLOL TARTRATE 25 MG/10 ML ORAL SUSPENSION
12.5000 mg | Freq: Two times a day (BID) | ORAL | Status: DC
  Filled 2014-02-02 (×3): qty 5

## 2014-02-02 MED ORDER — CETYLPYRIDINIUM CHLORIDE 0.05 % MT LIQD
7.0000 mL | Freq: Two times a day (BID) | OROMUCOSAL | Status: DC
  Administered 2014-02-02 – 2014-02-03 (×2): 7 mL via OROMUCOSAL

## 2014-02-02 MED ORDER — ACETAMINOPHEN 650 MG RE SUPP
650.0000 mg | Freq: Once | RECTAL | Status: AC
  Administered 2014-02-02: 650 mg via RECTAL

## 2014-02-02 MED ORDER — DONEPEZIL HCL 10 MG PO TABS
10.0000 mg | ORAL_TABLET | Freq: Every day | ORAL | Status: DC
  Administered 2014-02-02 – 2014-02-03 (×2): 10 mg via ORAL
  Filled 2014-02-02 (×3): qty 1

## 2014-02-02 MED ORDER — OXYCODONE HCL 5 MG PO TABS
5.0000 mg | ORAL_TABLET | ORAL | Status: DC | PRN
  Administered 2014-02-03: 5 mg via ORAL
  Filled 2014-02-02: qty 1

## 2014-02-02 MED ORDER — ONDANSETRON HCL 4 MG/2ML IJ SOLN
INTRAMUSCULAR | Status: DC | PRN
  Administered 2014-02-02: 4 mg via INTRAVENOUS

## 2014-02-02 MED ORDER — PHENYLEPHRINE HCL 10 MG/ML IJ SOLN
10.0000 mg | INTRAVENOUS | Status: DC | PRN
  Administered 2014-02-02: 50 ug/min via INTRAVENOUS

## 2014-02-02 MED ORDER — INSULIN REGULAR BOLUS VIA INFUSION
0.0000 [IU] | Freq: Three times a day (TID) | INTRAVENOUS | Status: DC
  Filled 2014-02-02: qty 10

## 2014-02-02 MED ORDER — NITROGLYCERIN IN D5W 200-5 MCG/ML-% IV SOLN
0.0000 ug/min | INTRAVENOUS | Status: DC
  Administered 2014-02-02: 20 ug/min via INTRAVENOUS

## 2014-02-02 MED ORDER — ROCURONIUM BROMIDE 100 MG/10ML IV SOLN
INTRAVENOUS | Status: DC | PRN
  Administered 2014-02-02: 40 mg via INTRAVENOUS
  Administered 2014-02-02: 5 mg via INTRAVENOUS

## 2014-02-02 MED ORDER — FENTANYL CITRATE 0.05 MG/ML IJ SOLN: 100.0000 ug | Freq: Once | INTRAMUSCULAR | Status: DC

## 2014-02-02 MED ORDER — SODIUM CHLORIDE 0.9 % IV SOLN
Freq: Once | INTRAVENOUS | Status: DC
  Administered 2014-02-02: 10 mL/h via INTRAVENOUS

## 2014-02-02 MED ORDER — ASPIRIN 81 MG PO CHEW: 324.0000 mg | CHEWABLE_TABLET | Freq: Every day | ORAL | Status: DC

## 2014-02-02 MED ORDER — MORPHINE SULFATE 2 MG/ML IJ SOLN: 2.0000 mg | INTRAMUSCULAR | Status: DC | PRN

## 2014-02-02 MED ORDER — DEXTROSE 5 % IV SOLN
0.0000 ug/min | INTRAVENOUS | Status: DC
  Filled 2014-02-02: qty 2

## 2014-02-02 MED ORDER — PHENOL 1.4 % MT LIQD
2.0000 | OROMUCOSAL | Status: DC | PRN
  Filled 2014-02-02: qty 177

## 2014-02-02 MED ORDER — MORPHINE SULFATE 2 MG/ML IJ SOLN: 1.0000 mg | INTRAMUSCULAR | Status: AC | PRN

## 2014-02-02 MED ORDER — GLYCOPYRROLATE 0.2 MG/ML IJ SOLN
INTRAMUSCULAR | Status: AC
  Filled 2014-02-02: qty 2

## 2014-02-02 MED ORDER — ACETAMINOPHEN 160 MG/5ML PO SOLN: 650.0000 mg | Freq: Once | ORAL | Status: AC

## 2014-02-02 MED ORDER — DEXTROSE 5 % IV SOLN: 10.0000 mg | INTRAVENOUS | Status: DC | PRN

## 2014-02-02 MED ORDER — FENTANYL CITRATE 0.05 MG/ML IJ SOLN
INTRAMUSCULAR | Status: AC
  Filled 2014-02-02: qty 5

## 2014-02-02 SURGICAL SUPPLY — 116 items
ADAPTER CARDIOPLEGIA (MISCELLANEOUS) IMPLANT
ANTEGRADE CPLG (MISCELLANEOUS) IMPLANT
ATTRACTOMAT 16X20 MAGNETIC DRP (DRAPES) IMPLANT
BAG BANDED W/RUBBER/TAPE 36X54 (MISCELLANEOUS) ×3 IMPLANT
BAG DECANTER FOR FLEXI CONT (MISCELLANEOUS) IMPLANT
BAG SNAP BAND KOVER 36X36 (MISCELLANEOUS) ×6 IMPLANT
BLADE 10 SAFETY STRL DISP (BLADE) ×3 IMPLANT
BLADE STERNUM SYSTEM 6 (BLADE) ×3 IMPLANT
BLADE SURG ROTATE 9660 (MISCELLANEOUS) IMPLANT
CABLE PACING FASLOC BIEGE (MISCELLANEOUS) ×6 IMPLANT
CABLE PACING FASLOC BLUE (MISCELLANEOUS) ×3 IMPLANT
CANISTER SUCTION 2500CC (MISCELLANEOUS) IMPLANT
CANNULA FEM VENOUS REMOTE 22FR (CANNULA) IMPLANT
CANNULA FEMORAL ART 14 SM (MISCELLANEOUS) IMPLANT
CANNULA GUNDRY RCSP 15FR (MISCELLANEOUS) IMPLANT
CANNULA OPTISITE PERFUSION 16F (CANNULA) IMPLANT
CANNULA OPTISITE PERFUSION 18F (CANNULA) IMPLANT
CANNULA SOFTFLOW AORTIC 7M21FR (CANNULA) IMPLANT
CANNULA VENOUS LOW PROF 34X46 (CANNULA) IMPLANT
CATH DIAG EXPO 6F VENT PIG 145 (CATHETERS) ×6 IMPLANT
CATH EXPO 5FR AL1 (CATHETERS) ×3 IMPLANT
CATH HEART VENT LEFT (CATHETERS) IMPLANT
CATH S G BIP PACING (SET/KITS/TRAYS/PACK) ×6 IMPLANT
CATH SOFT-VU 4F 65 STRAIGHT (CATHETERS) ×2 IMPLANT
CATH SOFT-VU STRAIGHT 4F 65CM (CATHETERS) ×1
CLIP TI MEDIUM 24 (CLIP) ×6 IMPLANT
CLIP TI WIDE RED SMALL 24 (CLIP) ×6 IMPLANT
CONN ST 1/4X3/8  BEN (MISCELLANEOUS)
CONN ST 1/4X3/8 BEN (MISCELLANEOUS) IMPLANT
CONNECTOR 1/2X3/8X1/2 3 WAY (MISCELLANEOUS)
CONNECTOR 1/2X3/8X1/2 3WAY (MISCELLANEOUS) IMPLANT
COVER DOME SNAP 22 D (MISCELLANEOUS) ×3 IMPLANT
COVER MAYO STAND STRL (DRAPES) ×3 IMPLANT
COVER PROBE W GEL 5X96 (DRAPES) IMPLANT
COVER SURGICAL LIGHT HANDLE (MISCELLANEOUS) ×3 IMPLANT
COVER TABLE BACK 60X90 (DRAPES) ×3 IMPLANT
CRADLE DONUT ADULT HEAD (MISCELLANEOUS) ×3 IMPLANT
DERMABOND ADVANCED (GAUZE/BANDAGES/DRESSINGS) ×1
DERMABOND ADVANCED .7 DNX12 (GAUZE/BANDAGES/DRESSINGS) ×2 IMPLANT
DRAIN CHANNEL 28F RND 3/8 FF (WOUND CARE) IMPLANT
DRAIN CHANNEL 32F RND 10.7 FF (WOUND CARE) IMPLANT
DRAPE INCISE IOBAN 66X45 STRL (DRAPES) IMPLANT
DRAPE SLUSH/WARMER DISC (DRAPES) ×3 IMPLANT
DRAPE TABLE COVER HEAVY DUTY (DRAPES) ×6 IMPLANT
DRSG TEGADERM 4X4.75 (GAUZE/BANDAGES/DRESSINGS) ×3 IMPLANT
ELECT REM PT RETURN 9FT ADLT (ELECTROSURGICAL) ×6
ELECTRODE REM PT RTRN 9FT ADLT (ELECTROSURGICAL) ×4 IMPLANT
FELT TEFLON 6X6 (MISCELLANEOUS) ×3 IMPLANT
FEMORAL VENOUS CANN RAP (CANNULA) IMPLANT
GAUZE SPONGE 4X4 12PLY STRL (GAUZE/BANDAGES/DRESSINGS) ×3 IMPLANT
GLOVE ECLIPSE 7.5 STRL STRAW (GLOVE) ×3 IMPLANT
GLOVE ECLIPSE 8.0 STRL XLNG CF (GLOVE) ×6 IMPLANT
GLOVE EUDERMIC 7 POWDERFREE (GLOVE) ×3 IMPLANT
GLOVE ORTHO TXT STRL SZ7.5 (GLOVE) ×3 IMPLANT
GOWN STRL REUS W/ TWL LRG LVL3 (GOWN DISPOSABLE) ×6 IMPLANT
GOWN STRL REUS W/ TWL XL LVL3 (GOWN DISPOSABLE) ×12 IMPLANT
GOWN STRL REUS W/TWL LRG LVL3 (GOWN DISPOSABLE) ×3
GOWN STRL REUS W/TWL XL LVL3 (GOWN DISPOSABLE) ×6
GUIDEWIRE SAF TJ AMPL .035X180 (WIRE) ×3 IMPLANT
GUIDEWIRE SAFE TJ AMPLATZ EXST (WIRE) ×3 IMPLANT
GUIDEWIRE STRAIGHT .035 260CM (WIRE) ×3 IMPLANT
HEMOSTAT POWDER SURGIFOAM 1G (HEMOSTASIS) IMPLANT
INSERT FOGARTY 61MM (MISCELLANEOUS) IMPLANT
INSERT FOGARTY SM (MISCELLANEOUS) ×6 IMPLANT
INSERT FOGARTY XLG (MISCELLANEOUS) IMPLANT
KIT BASIN OR (CUSTOM PROCEDURE TRAY) ×3 IMPLANT
KIT DILATOR VASC 18G NDL (KITS) IMPLANT
KIT HEART LEFT (KITS) ×3 IMPLANT
KIT ROOM TURNOVER OR (KITS) ×3 IMPLANT
KIT SUCTION CATH 14FR (SUCTIONS) ×6 IMPLANT
LEAD PACING MYOCARDI (MISCELLANEOUS) IMPLANT
NEEDLE PERC 18GX7CM (NEEDLE) ×6 IMPLANT
NS IRRIG 1000ML POUR BTL (IV SOLUTION) ×9 IMPLANT
PACK AORTA (CUSTOM PROCEDURE TRAY) ×3 IMPLANT
PAD ARMBOARD 7.5X6 YLW CONV (MISCELLANEOUS) ×6 IMPLANT
PAD ELECT DEFIB RADIOL ZOLL (MISCELLANEOUS) ×3 IMPLANT
PATCH TACHOSII LRG 9.5X4.8 (VASCULAR PRODUCTS) IMPLANT
SET CANNULATION TOURNIQUET (MISCELLANEOUS) IMPLANT
SHEATH PINNACLE 6F 10CM (SHEATH) ×6 IMPLANT
SPONGE GAUZE 4X4 12PLY STER LF (GAUZE/BANDAGES/DRESSINGS) ×3 IMPLANT
SPONGE LAP 4X18 X RAY DECT (DISPOSABLE) ×3 IMPLANT
STOPCOCK MORSE 400PSI 3WAY (MISCELLANEOUS) ×3 IMPLANT
SUT ETHIBOND 2 0 SH (SUTURE) ×1
SUT ETHIBOND 2 0 SH 36X2 (SUTURE) ×2 IMPLANT
SUT ETHIBOND X763 2 0 SH 1 (SUTURE) ×6 IMPLANT
SUT MNCRL AB 3-0 PS2 18 (SUTURE) ×3 IMPLANT
SUT PDS AB 1 CTX 36 (SUTURE) IMPLANT
SUT PROLENE 2 0 MH 48 (SUTURE) IMPLANT
SUT PROLENE 3 0 SH1 36 (SUTURE) IMPLANT
SUT PROLENE 4 0 RB 1 (SUTURE)
SUT PROLENE 4-0 RB1 .5 CRCL 36 (SUTURE) IMPLANT
SUT PROLENE 5 0 C 1 36 (SUTURE) ×9 IMPLANT
SUT PROLENE 6 0 C 1 30 (SUTURE) ×9 IMPLANT
SUT SILK  1 MH (SUTURE) ×1
SUT SILK 1 MH (SUTURE) ×2 IMPLANT
SUT SILK 2 0 SH CR/8 (SUTURE) ×3 IMPLANT
SUT TEM PAC WIRE 2 0 SH (SUTURE) IMPLANT
SUT VIC AB 2-0 CTX 36 (SUTURE) IMPLANT
SUT VIC AB 3-0 SH 27 (SUTURE) ×1
SUT VIC AB 3-0 SH 27X BRD (SUTURE) ×2 IMPLANT
SUT VIC AB 3-0 SH 8-18 (SUTURE) ×6 IMPLANT
SYR 30ML LL (SYRINGE) ×6 IMPLANT
SYR 50ML LL SCALE MARK (SYRINGE) ×3 IMPLANT
SYR 5ML LL (SYRINGE) ×3 IMPLANT
SYSTEM SAHARA CHEST DRAIN ATS (WOUND CARE) ×3 IMPLANT
TAPE CLOTH SURG 4X10 WHT LF (GAUZE/BANDAGES/DRESSINGS) ×3 IMPLANT
TOWEL OR 17X24 6PK STRL BLUE (TOWEL DISPOSABLE) ×9 IMPLANT
TOWEL OR 17X26 10 PK STRL BLUE (TOWEL DISPOSABLE) ×6 IMPLANT
TRAY FOLEY IC TEMP SENS 14FR (CATHETERS) ×3 IMPLANT
TUBE SUCT INTRACARD DLP 20F (MISCELLANEOUS) IMPLANT
TUBING HIGH PRESSURE 120CM (CONNECTOR) ×3 IMPLANT
UNDERPAD 30X30 INCONTINENT (UNDERPADS AND DIAPERS) ×3 IMPLANT
VALVE HRT TRANSCATH NOVAFL 23M (Valve) ×3 IMPLANT
VENT LEFT HEART 12002 (CATHETERS)
WATER STERILE IRR 1000ML POUR (IV SOLUTION) ×6 IMPLANT
WIRE AMPLATZ SS-J .035X180CM (WIRE) ×3 IMPLANT

## 2014-02-02 NOTE — Telephone Encounter (Signed)
Dr. Cornelius Moraswen and Dr. Excell Seltzerooper can answer this question.  Rebecca Montefusco [place] MD, [facility]

## 2014-02-02 NOTE — Anesthesia Procedure Notes (Signed)
Procedure Name: Intubation Performed by: Rakisha Pincock Pre-anesthesia Checklist: Patient identified, Emergency Drugs available, Suction available and Patient being monitored Patient Re-evaluated:Patient Re-evaluated prior to inductionOxygen Delivery Method: Circle system utilized Preoxygenation: Pre-oxygenation with 100% oxygen Intubation Type: IV induction Ventilation: Mask ventilation without difficulty Laryngoscope Size: Miller and 2 Grade View: Grade I Tube type: Oral Tube size: 7.5 mm Number of attempts: 1 Airway Equipment and Method: Stylet Dental Injury: Teeth and Oropharynx as per pre-operative assessment

## 2014-02-02 NOTE — Anesthesia Postprocedure Evaluation (Signed)
  Anesthesia Post-op Note  Patient: Rebecca RoyaltyJennie Frede  Procedure(s) Performed: Procedure(s): TRANSCATHETER AORTIC VALVE REPLACEMENT, TRANSFEMORAL (N/A) INTRAOPERATIVE TRANSESOPHAGEAL ECHOCARDIOGRAM (N/A)  Patient Location: SICU  Anesthesia Type:General  Level of Consciousness: awake, alert  and oriented  Airway and Oxygen Therapy: Patient Spontanous Breathing and Patient connected to nasal cannula oxygen  Post-op Pain: mild  Post-op Assessment: Post-op Vital signs reviewed, Patient's Cardiovascular Status Stable, Respiratory Function Stable, Patent Airway, No signs of Nausea or vomiting and Pain level controlled  Post-op Vital Signs: stable  Last Vitals:  Filed Vitals:   02/02/14 1715  BP: 130/58  Pulse: 65  Temp: 36.5 C  Resp: 16    Complications: No apparent anesthesia complications

## 2014-02-02 NOTE — Transfer of Care (Signed)
Immediate Anesthesia Transfer of Care Note  Patient: Rebecca RoyaltyJennie [name]  Procedure(s) Performed: Procedure(s): TRANSCATHETER AORTIC VALVE REPLACEMENT, TRANSFEMORAL (N/A) INTRAOPERATIVE TRANSESOPHAGEAL ECHOCARDIOGRAM (N/A)  Patient Location: ICU  Anesthesia Type:General  Level of Consciousness: awake, alert  and oriented  Airway & Oxygen Therapy: Patient Spontanous Breathing and Patient connected to face mask oxygen  Post-op Assessment: Report given to PACU RN  Post vital signs: Reviewed and stable  Complications: No apparent anesthesia complications

## 2014-02-02 NOTE — Op Note (Signed)
[facility]  TAVR OPERATIVE NOTE   Date of Procedure:  02/02/2014  Preoperative Diagnosis: Severe Aortic Stenosis   Postoperative Diagnosis: Same   Procedure:    Transcatheter Aortic Valve Replacement - Transfemoral Approach  Edwards Sapien XT THV (size 23 mm, model # 9300TFX, serial # [id number])   Co-Surgeons:  Salvatore Decentlarence H. Cornelius Moraswen, MD and Tonny BollmanMichael Khamille Beynon, MD  Assistants:   Verne Carrowhristopher McAlhany, MD  Anesthesiologist:  Kipp Broodavid Joslin, MD  Echocardiographer:  Tobias AlexanderKatarina Nelson, MD  Pre-operative Echo Findings:  Severe aortic stenosis  Normal left ventricular systolic function  Mild MR  Post-operative Echo Findings:  No paravalvular leak  Normal left ventricular systolic function  Mild MR  BRIEF CLINICAL NOTE AND INDICATIONS FOR SURGERY  80 year old woman with severe symptomatic aortic stenosis. The patient has been followed by Dr. Swazil[place]Jordan. In the spring of 2014 she was hospitalized for an episode of near syncope. At that time she was noted to have moderate to severe aortic stenosis, but was not considered a good surgical candidate because of her age and memory impairment. She did well until August of this year when she had an episode of frank syncope. She did not feel well when she woke up one morning, and while sitting at the table to be breakfast, she slumped over. This was a witnessed event and the patient was noted to be completely unresponsive with slow breathing. EMS was called and they recommended that the family put the patient on the floor. She slowly regained consciousness about 10 minutes later. She was hospitalized without any clear explanation of why this event may have occurred. She has no history of orthostasis. An echocardiogram showed progressive and severe aortic stenosis and she was referred for further discussion. She has been formally evaluated by Dr. Cornelius Moraswen and Dr Laneta SimmersBartle with cardiac surgery who felt that she is a poor candidate for conventional aortic  valve replacement considering her advanced age and underlying dementia.   During the course of the patient's preoperative work up they have been evaluated comprehensively by a multidisciplinary team of specialists coordinated through the [facility].  They have been demonstrated to suffer from symptomatic severe aortic stenosis as noted above. The patient has been counseled extensively as to the relative risks and benefits of all options for the treatment of severe aortic stenosis including long term medical therapy, conventional surgery for aortic valve replacement, and transcatheter aortic valve replacement.  The patient has been independently evaluated by two cardiac surgeons including Dr Cornelius Moraswen and Dr. Laneta SimmersBartle, and they are felt to be at high risk for conventional surgical aortic valve replacement based upon a predicted risk of mortality using the Society of Thor[employer] Surgeons risk calculator of 4.8%. Both surgeons indicated the patient would be a poor candidate for conventional surgery (predicted risk of mortality >15% and/or predicted risk of permanent morbidity >50%) because of comorbidities including moderate dementia and advanced age.   Based upon review of all of the patient's preoperative diagnostic tests they are felt to be candidate for transcatheter aortic valve replacement using the transfemoral approach as an alternative to high risk conventional surgery.    Following the decision to proceed with transcatheter aortic valve replacement, a discussion has been held regarding what types of management strategies would be attempted intraoperatively in the event of life-threatening complications, including whether or not the patient would be considered a candidate for the use of cardiopulmonary bypass and/or conversion to open sternotomy for  attempted surgical intervention.  The patient has been advised of a variety of complications that  might develop peculiar to this approach including but not limited to risks of death, stroke, paravalvular leak, aortic dissection or other major vascular complications, aortic annulus rupture, device embolization, cardiac rupture or perforation, acute myocardial infarction, arrhythmia, heart block or bradycardia requiring permanent pacemaker placement, congestive heart failure, respiratory failure, renal failure, pneumonia, infection, other late complications related to structural valve deterioration or migration, or other complications that might ultimately cause a temporary or permanent loss of functional independence or other long term morbidity.  The patient provides full informed consent for the procedure as described and all questions were answered preoperatively.    DETAILS OF THE OPERATIVE PROCEDURE  PREPARATION:    The patient is brought to the operating room on the above mentioned date and central monitoring was established by the anesthesia team including placement of Swan-Ganz catheter and radial arterial line. The patient is placed in the supine position on the operating table.  Intravenous antibiotics are administered. General endotracheal anesthesia is induced uneventfully. A Foley catheter is placed.  Baseline transesophageal echocardiogram was performed. The patient's chest, abdomen, both groins, and both lower extremities are prepared and draped in a sterile manner. A time out procedure is performed.   PERIPHERAL ACCESS:    Using the modified Seldinger technique, femoral arterial and venous access was obtained with placement of 6 Fr sheaths on the left side.  A pigtail diagnostic catheter was passed through the left femoral arterial sheath under fluoroscopic guidance into the aortic root.  A temporary transvenous pacemaker catheter was passed through the left femoral venous sheath under fluoroscopic guidance into the right ventricle.  The pacemaker was tested to ensure stable lead  placement and pacemaker capture. Aortic root angiography was performed in order to determine the optimal angiographic angle for valve deployment.   TRANSFEMORAL ACCESS:   A femoral arterial cutdown was performed by Dr Cornelius Moras. Please see his separate operative note for details. The patient was heparinized systemically and ACT verified > 250 seconds.    A 16 Fr transfemoral sheath was introduced into the right femoral artery after progressively dilating over an Amplatz superstiff wire. An AL-1 catheter was used to direct a straight-tip exchange length wire across the native aortic valve into the left ventricle. This was exchanged out for a pigtail catheter and position was confirmed in the LV apex.  Simultaneous LV and Ao pressures were recorded.  The pigtail catheter was then exchanged for an Amplatz Extra-stiff wire in the LV apex. At that point, BAV was performed using a 20 mm valvuloplasty balloon.  Once optimal position was achieved, BAV was done under rapid ventricular pacing at 180 bpm. The patient recovered well hemodynamically.   TRANSCATHETER HEART VALVE DEPLOYMENT:  An Edwards Sapien XT THV (size 23 mm) was prepared and crimped per manufacturer's guidelines, and the proper orientation of the valve is confirmed on the [employer] delivery system. The valve was advanced through the introducer sheath using normal technique until in an appropriate position in the abdominal aorta beyond the sheath tip. The balloon was then retracted and using the fine-tuning wheel was centered on the valve. The valve was then advanced across the aortic arch using appropriate flexion of the catheter. The valve was carefully positioned across the aortic valve annulus. The Novaflex catheter was retracted using normal technique. Once final position of the valve has been confirmed by angiographic assessment, the valve is deployed while temporarily holding  ventilation and during rapid ventricular pacing to maintain  systolic blood pressure < 50 mmHg and pulse pressure < 10 mmHg. The balloon inflation is held for >3 seconds after reaching full deployment volume. Once the balloon has fully deflated the balloon is retracted into the ascending aorta and valve function is assessed using TEE. There is felt to be no paravalvular leak and no central aortic insufficiency.  The patient's hemodynamic recovery following valve deployment is good.  The deployment balloon and guidewire are both removed. Echo demostrated acceptable post-procedural gradients, stable mitral valve function, and no AI.   PROCEDURE COMPLETION:  The sheath was then removed and arteriotomy repaired by Dr Cornelius Moras. Please see his separate report for details. Distal abdominal aortography was performed to evaluate for any arterial injury related to the procedure.  There was no evidence dissection, perforation, or other vascular injury in the abdominal aorta, iliac artery, or femoral artery.  Protamine was administered once femoral arterial repair was complete. The temporary pacemaker, pigtail catheters and femoral sheaths were removed with manual pressure used for hemostasis.   The patient tolerated the procedure well and is transported to the surgical intensive care in stable condition. There were no immediate intraoperative complications. All sponge instrument and needle counts are verified correct at completion of the operation.   No blood products were administered during the operation.  The patient received a total of 65 mL of intravenous contrast during the procedure.  Tonny Bollman MD 02/02/2014 1:56 PM

## 2014-02-02 NOTE — Anesthesia Preprocedure Evaluation (Signed)
Anesthesia Evaluation  Patient identified by MRN, date of birth, ID band Patient awake    Reviewed: Allergy & Precautions, H&P , NPO status , Patient's Chart, lab work & pertinent test results, reviewed documented beta blocker date and time   Airway       Dental   Pulmonary former smoker,          Cardiovascular hypertension, Pt. on medications + Valvular Problems/Murmurs     Neuro/Psych PSYCHIATRIC DISORDERS    GI/Hepatic   Endo/Other    Renal/GU      Musculoskeletal   Abdominal   Peds  Hematology   Anesthesia Other Findings   Reproductive/Obstetrics                           Anesthesia Physical Anesthesia Plan  ASA: IV  Anesthesia Plan: General   Post-op Pain Management:    Induction: Intravenous  Airway Management Planned: Oral ETT  Additional Equipment: Arterial line, CVP, PA Cath and 3D TEE  Intra-op Plan:   Post-operative Plan: Extubation in OR and Possible Post-op intubation/ventilation  Informed Consent: I have reviewed the patients History and Physical, chart, labs and discussed the procedure including the risks, benefits and alternatives for the proposed anesthesia with the patient or authorized representative who has indicated his/her understanding and acceptance.   Dental advisory given  Plan Discussed with: CRNA, Anesthesiologist and Surgeon  Anesthesia Plan Comments:         Anesthesia Quick Evaluation

## 2014-02-02 NOTE — Progress Notes (Signed)
Utilization Review Completed.Rebecca [name] T10/09/2013  

## 2014-02-02 NOTE — Progress Notes (Signed)
  Echocardiogram 2D Echocardiogram has been performed.  Arvil ChacoFoster, Rufus Cypert 02/02/2014, 2:00 PM

## 2014-02-02 NOTE — Progress Notes (Signed)
CT surgery p.m. Rounds   Patient alert and responsive following T. AVR Bleeding around right IJ Cordis has stopped Sinus rhythm, vital signs stable Neuro intact

## 2014-02-02 NOTE — Interval H&P Note (Signed)
History and Physical Interval Note:  02/02/2014 10:42 AM  Rebecca [name]  has presented today for surgery, with the diagnosis of aortic stenosis  The various methods of treatment have been discussed with the patient and family. After consideration of risks, benefits and other options for treatment, the patient has consented to  Procedure(s): TRANSCATHETER AORTIC VALVE REPLACEMENT, TRANSFEMORAL (N/A) INTRAOPERATIVE TRANSESOPHAGEAL ECHOCARDIOGRAM (N/A) as a surgical intervention .  The patient's history has been reviewed, patient examined, no change in status, stable for surgery.  I have reviewed the patient's chart and labs.  Questions were answered to the patient's satisfaction.    Please see note above of Dr Cornelius Moraswen who evaluated the patient yesterday. We have reviewed her case extensively with the Multidisciplinary Heart Team and plan TAVR via a right transfemoral approach (23 mm Sapien XT valve). No changes to add to the H&P above. All questions answered.  Tonny BollmanMichael Stepen Prins MD

## 2014-02-02 NOTE — Op Note (Signed)
CARDIOTHORACIC SURGERY OPERATIVE NOTE  Date of Procedure:  02/02/2014  Preoperative Diagnosis: Severe Aortic Stenosis   Postoperative Diagnosis: Same   Procedure:    Transcatheter Aortic Valve Replacement - Open Right Transfemoral Approach  Edwards Sapien XT (size 23 mm, model # 9300TFX, serial # [id number])   Co-Surgeons:  Salvatore Decentlarence H. Cornelius Moraswen, MD and Tonny BollmanMichael Cooper, MD  Assistants:   Verne Carrowhristopher McAlhany, MD and Coral CeoGina Collins, PA-C  Anesthesiologist:  Kipp Broodavid Joslin, MD  Echocardiographer:  Tobias AlexanderKatarina Nelson, MD  Pre-operative Echo Findings:  Severe aortic stenosis  Normal left ventricular systolic function  Post-operative Echo Findings:  No paravalvular leak  Normal left ventricular systolic function      DETAILS OF THE OPERATIVE PROCEDURE  The majority of the procedure is documented separately in a procedure note by Dr. Excell Seltzerooper.   TRANSFEMORAL ACCESS:   A small incision is made in the right groin immediately over the common femoral artery. The subcutaneous tissues are divided with electrocautery and the anterior surface of the common femoral artery is identified. Sharp dissection is utilized to free up the artery proximally and distally and the vessel is encircled with a vessel loop.  A pair of CV-4 Gore-tex sutures are place as diamond-shaped purse-strings on the anterior surface of the femoral artery.  The patient is heparinized systemically and ACT verified > 250 seconds.  The common femoral artery is punctured using an 18 gauge needle and a soft J-tipped guidewire is passed into the common iliac artery under fluoroscopic guidance.  A 6 Fr straight diagnostic catheter is placed over the guidewire and the guidewire is removed.  An Amplatz super stiff guidewire is passed through the sheath into the descending thoracic aorta and the introducing diagnostic catheter is removed.  Serial dilators are passed over the guidewire under continuous fluoroscopic guidance, making certain that  each dilator passes easily all of the way into the distal abdominal aorta.  A 16 Fr Edwards Novoflex Plus introducer sheath is passed over the guidewire into the abdominal aorta.  The introducing dilator is removed, the sheath is flushed with heparinized saline, and the sheath is secured to the skin.    FEMORAL SHEATH REMOVAL AND ARTERIAL CLOSURE:  After the completion of successful valve deployment as documented separately by Dr. Excell Seltzerooper, the femoral artery sheath is removed and the arteriotomy is closed using the previously placed Gore-tex purse-string sutures. Once the repair has been completed protamine was administered to reverse the anticoagulation. A digitally-subtracted arteriogram is obtained from just above the aortic bifurcation to below the arteriotomy to confirm the integrity of the vascular repair.  The incision is irrigated with saline solution and subsequently closed in multiple layers using absorbable suture.  The skin incision is closed using a subcuticular skin closure.     Salvatore Decentlarence H. Cornelius Moraswen MD 02/02/2014 2:08 PM

## 2014-02-03 ENCOUNTER — Inpatient Hospital Stay (HOSPITAL_COMMUNITY): Payer: Medicare Other

## 2014-02-03 ENCOUNTER — Encounter: Payer: Medicare Other | Admitting: Surgery

## 2014-02-03 DIAGNOSIS — I359 Nonrheumatic aortic valve disorder, unspecified: Secondary | ICD-10-CM

## 2014-02-03 DIAGNOSIS — I059 Rheumatic mitral valve disease, unspecified: Secondary | ICD-10-CM

## 2014-02-03 LAB — CBC
HCT: 31.2 % — ABNORMAL LOW (ref 36.0–46.0)
HEMOGLOBIN: 10.5 g/dL — AB (ref 12.0–15.0)
MCH: 29.6 pg (ref 26.0–34.0)
MCHC: 33.7 g/dL (ref 30.0–36.0)
MCV: 87.9 fL (ref 78.0–100.0)
Platelets: 142 10*3/uL — ABNORMAL LOW (ref 150–400)
RBC: 3.55 MIL/uL — AB (ref 3.87–5.11)
RDW: 13 % (ref 11.5–15.5)
WBC: 8 10*3/uL (ref 4.0–10.5)

## 2014-02-03 LAB — BASIC METABOLIC PANEL
Anion gap: 11 (ref 5–15)
BUN: 8 mg/dL (ref 6–23)
CALCIUM: 8 mg/dL — AB (ref 8.4–10.5)
CO2: 27 meq/L (ref 19–32)
CREATININE: 0.78 mg/dL (ref 0.50–1.10)
Chloride: 103 mEq/L (ref 96–112)
GFR calc Af Amer: 86 mL/min — ABNORMAL LOW (ref >90)
GFR, EST NON AFRICAN AMERICAN: 74 mL/min — AB (ref >90)
GLUCOSE: 148 mg/dL — AB (ref 70–99)
Potassium: 3.2 mEq/L — ABNORMAL LOW (ref 3.7–5.3)
SODIUM: 141 meq/L (ref 137–147)

## 2014-02-03 LAB — GLUCOSE, CAPILLARY
GLUCOSE-CAPILLARY: 88 mg/dL (ref 70–99)
Glucose-Capillary: 132 mg/dL — ABNORMAL HIGH (ref 70–99)
Glucose-Capillary: 118 mg/dL — ABNORMAL HIGH (ref 70–99)
Glucose-Capillary: 106 mg/dL — ABNORMAL HIGH (ref 70–99)

## 2014-02-03 LAB — MAGNESIUM: Magnesium: 1.8 mg/dL (ref 1.5–2.5)

## 2014-02-03 MED ORDER — WARFARIN - PHARMACIST DOSING INPATIENT: Freq: Every day | Status: DC

## 2014-02-03 MED ORDER — WARFARIN SODIUM 5 MG PO TABS
5.0000 mg | ORAL_TABLET | Freq: Once | ORAL | Status: AC
  Administered 2014-02-03: 5 mg via ORAL
  Filled 2014-02-03: qty 1

## 2014-02-03 MED ORDER — ASPIRIN 81 MG PO CHEW: 81.0000 mg | CHEWABLE_TABLET | Freq: Every day | ORAL | Status: DC

## 2014-02-03 MED ORDER — POTASSIUM CHLORIDE 10 MEQ/50ML IV SOLN
10.0000 meq | INTRAVENOUS | Status: AC
  Administered 2014-02-03 (×3): 10 meq via INTRAVENOUS

## 2014-02-03 MED ORDER — SODIUM CHLORIDE 0.9 % IJ SOLN
3.0000 mL | Freq: Two times a day (BID) | INTRAMUSCULAR | Status: DC
  Administered 2014-02-03 (×2): 3 mL via INTRAVENOUS

## 2014-02-03 MED ORDER — ASPIRIN EC 81 MG PO TBEC
81.0000 mg | DELAYED_RELEASE_TABLET | Freq: Every day | ORAL | Status: DC
  Administered 2014-02-03 – 2014-02-04 (×2): 81 mg via ORAL
  Filled 2014-02-03 (×2): qty 1

## 2014-02-03 MED ORDER — HEPARIN SODIUM (PORCINE) 5000 UNIT/ML IJ SOLN
5000.0000 [IU] | Freq: Three times a day (TID) | INTRAMUSCULAR | Status: DC
  Administered 2014-02-03 – 2014-02-04 (×3): 5000 [IU] via SUBCUTANEOUS
  Filled 2014-02-03 (×4): qty 1

## 2014-02-03 MED ORDER — POTASSIUM CHLORIDE CRYS ER 20 MEQ PO TBCR
20.0000 meq | EXTENDED_RELEASE_TABLET | Freq: Every day | ORAL | Status: DC
  Administered 2014-02-03 – 2014-02-04 (×2): 20 meq via ORAL
  Filled 2014-02-03 (×2): qty 1

## 2014-02-03 NOTE — Progress Notes (Signed)
  Echocardiogram 2D Echocardiogram has been performed.  Rebecca [name] FRANCES 02/03/2014, 10:22 AM

## 2014-02-03 NOTE — Progress Notes (Addendum)
      [address]       [place] [id number]             [phone]      1 Day Post-Op Procedure(s) (LRB): TRANSCATHETER AORTIC VALVE REPLACEMENT, TRANSFEMORAL (N/A) INTRAOPERATIVE TRANSESOPHAGEAL ECHOCARDIOGRAM (N/A)  Subjective:  Ms. Rebecca [name] complains she is tired this morning.  She looks great.  Objective: Vital signs in last 24 hours: Temp:  [96.3 F (35.7 C)-99.1 F (37.3 C)] 98.4 F (36.9 C) (10/07 0743) Pulse Rate:  [54-86] 74 (10/07 0700) Cardiac Rhythm:  [-] Normal sinus rhythm (10/07 0700) Resp:  [8-23] 15 (10/07 0700) BP: (122-149)/(46-68) 125/47 mmHg (10/07 0700) SpO2:  [90 %-100 %] 93 % (10/07 0700) Arterial Line BP: (119-163)/(44-68) 119/44 mmHg (10/07 0700) Weight:  [123 lb (55.792 kg)-125 lb (56.7 kg)] 125 lb (56.7 kg) (10/07 0500)  Hemodynamic parameters for last 24 hours: PAP: (21-36)/(8-23) 22/9 mmHg CO:  [3.3 L/min-4 L/min] 3.3 L/min CI:  [2.1 L/min/m2-2.6 L/min/m2] 2.1 L/min/m2  Intake/Output from previous day: 10/06 0701 - 10/07 0700 In: 3436.6 [P.O.:180; I.V.:2406.6; IV Piggyback:850] Out: 3380 [Urine:3280; Blood:100]  General appearance: alert, cooperative and no distress Heart: regular rate and rhythm Lungs: clear to auscultation bilaterally Abdomen: soft, non-tender; bowel sounds normal; no masses,  no organomegaly Extremities: edema non appreciated Wound: clean and dry  Lab Results:  Recent Labs  02/02/14 1930 02/02/14 2208 02/03/14 0345  WBC 9.4  --  8.0  HGB 11.3* 11.6* 10.5*  HCT 34.2* 34.0* 31.2*  PLT 156  --  142*   BMET:  Recent Labs  02/02/14 2208 02/03/14 0345  NA 143 141  K 3.8 3.2*  CL 105 103  CO2  --  27  GLUCOSE 87 148*  BUN 6 8  CREATININE 0.70 0.78  CALCIUM  --  8.0*    PT/INR:  Recent Labs  02/02/14 1400  LABPROT 16.2*  INR 1.30   ABG    Component Value Date/Time   PHART 7.349* 02/02/2014 1448   HCO3 28.9* 02/02/2014 1448   TCO2 26 02/02/2014 2208   O2SAT 100.0 02/02/2014 1448     CBG (last 3)   Recent Labs  02/02/14 2334 02/03/14 0320 02/03/14 0741  GLUCAP 88 132* 118*    Assessment/Plan: S/P Procedure(s) (LRB): TRANSCATHETER AORTIC VALVE REPLACEMENT, TRANSFEMORAL (N/A) INTRAOPERATIVE TRANSESOPHAGEAL ECHOCARDIOGRAM (N/A)  1. CV- H/O A. Fib,  maintaining NSR, on Lopressor 2. Pulm- wean oxygen as tolerated, + atelectasis on CXR, no pulmonary edema 3. Renal- +Hypokalemia, supplementation ordered per Cardiology 4. Dispo- patient looks great, care per Cardiology   LOS: 1 day    BARRETT, ERIN 02/03/2014  I have seen and examined the patient and agree with the assessment and plan as outlined.  Doing very well.  Routing postop.  Routine f/u ECHO today.  Transfer to floor.  OWEN,CLARENCE H 02/03/2014 9:48 AM

## 2014-02-03 NOTE — Progress Notes (Signed)
ANTICOAGULATION CONSULT NOTE - Initial Consult  Pharmacy Consult for Coumadin with heparin bridge  Indication: atrial fibrillation  No Known Allergies  Patient Measurements: Height: 5\' 3"  (160 cm) Weight: 125 lb (56.7 kg) IBW/kg (Calculated) : 52.4  Vital Signs: Temp: 98.4 F (36.9 C) (10/07 0743) Temp Source: Oral (10/07 0743) BP: 124/49 mmHg (10/07 0900) Pulse Rate: 73 (10/07 0900)  Labs:  Recent Labs  02/02/14 0927  02/02/14 1400  02/02/14 1930 02/02/14 2208 02/03/14 0345  HGB  --   < > 11.3*  < > 11.3* 11.6* 10.5*  HCT  --   < > 34.7*  < > 34.2* 34.0* 31.2*  PLT  --   --  164  --  156  --  142*  APTT  --   --  33  --   --   --   --   LABPROT 14.4  --  16.2*  --   --   --   --   INR 1.12  --  1.30  --   --   --   --   CREATININE  --   < >  --   < > 0.64 0.70 0.78  < > = values in this interval not displayed.  Estimated Creatinine Clearance: 42.5 ml/min (by C-G formula based on Cr of 0.78).   Medical History: Past Medical History  Diagnosis Date  . Hypertension   . High cholesterol   . UTI (lower urinary tract infection)   . Aortic stenosis   . Syncope 08/21/2012  . Paroxysmal atrial fibrillation   . Age-related macular degeneration, wet, right eye   . Heart murmur     "slight"  . Dementia     "slight"/daughter 12/02/2013  . S/P TAVR (transcatheter aortic valve replacement) 02/02/2014    23 mm Edwards Sapien XT transcatheter heart valve placed via open right transfemoral approach    Medications:  Prescriptions prior to admission  Medication Sig Dispense Refill  . atorvastatin (LIPITOR) 20 MG tablet Take 20 mg by mouth daily.      [name]. Besifloxacin HCl (BESIVANCE) 0.6 % SUSP Place 1 drop into the right eye 3 (three) times daily. Only use for 3 days after eye injections      . calcium citrate (CALCITRATE - DOSED IN MG ELEMENTAL CALCIUM) 950 MG tablet Take 1 tablet by mouth daily.      [name]. donepezil (ARICEPT) 10 MG tablet Take 1 tablet (10 mg total) by mouth  daily.  30 tablet  12  . Multiple Vitamin (MULTIVITAMIN) capsule Take 1 capsule by mouth daily.      [name]. NIFEdipine (PROCARDIA XL/ADALAT-CC) 90 MG 24 hr tablet Take 90 mg by mouth daily.      [name]. warfarin (COUMADIN) 2.5 MG tablet Take 2.5-3.75 mg by mouth daily. Take 3.75 mg every Monday & thurs then take 2.5 mg the rest of the week        Assessment: [age] YOF with severe aortic stenosis s/p TAVR POD #1. Pharmacy consulted to resume warfarin for atrial fibrillation with heparin bridge. INR yesterday was 1.3. H/H and plt are low. Likely ABLA.   Home Coumadin dose: 3.75 mg on Mon/Thur; 2.5 mg on all other days   Goal of Therapy:  INR 2-3 Monitor platelets by anticoagulation protocol: Yes   Plan:  1) Give Coumadin 5 mg x 1 dose today 2) Start heparin 5000 units [place] until INR > 2  3) Monitor daily PT/INR and s/s of bleeding  Albertina Parr, PharmD.  Clinical Pharmacist Pager [phone]

## 2014-02-03 NOTE — Progress Notes (Signed)
Anesthesiology Follow-up:  Awake and alert, neuro intact, hemodynamically stable, sitting in chair, complaining of mild sore throat and discomfort at central line site.  VS: T- 37 BP- 122/56 HR- 73 (SR with PACs) RR- 21 O2 Sat 93% on 2L   K-3.2 glucose- 126 BUN/Cr.- 8/0.78 H/H- 10.5/31.2 Platelets- 142,000  POD #1 S/P TAVR with Edwards Sapien XT 23 mm  Doing well overall, extubated in OR yesterday prior to transport to ICU.  Kipp Broodavid Bemnet Trovato, MD

## 2014-02-03 NOTE — Addendum Note (Signed)
Addendum created 02/03/14 0716 by Kipp Broodavid Raylan Hanton, MD   Modules edited: Notes Section   Notes Section:  File: [id number]; Pend: [id number]; Pend: [id number]

## 2014-02-03 NOTE — Progress Notes (Addendum)
    Subjective:  No chest pain or shortness of breath. C/O neck pain, sore throat.   Objective:  Vital Signs in the last 24 hours: Temp:  [96.3 F (35.7 C)-99.1 F (37.3 C)] 98.4 F (36.9 C) (10/07 0743) Pulse Rate:  [54-86] 74 (10/07 0700) Resp:  [8-23] 15 (10/07 0700) BP: (122-149)/(46-68) 125/47 mmHg (10/07 0700) SpO2:  [90 %-100 %] 93 % (10/07 0700) Arterial Line BP: (119-163)/(44-68) 119/44 mmHg (10/07 0700) Weight:  [123 lb (55.792 kg)-125 lb (56.7 kg)] 125 lb (56.7 kg) (10/07 0500)  Intake/Output from previous day: 10/06 0701 - 10/07 0700 In: 3436.6 [P.O.:180; I.V.:2406.6; IV Piggyback:850] Out: 3380 [Urine:3280; Blood:100]  Physical Exam: Pt is alert and oriented, pleasant elderly woman in NAD HEENT: normal Neck: JVP - normal Lungs: CTA bilaterally CV: RRR with soft systolic murmur at the RUSB Abd: soft, NT, Positive BS, no hepatomegaly Ext: no C/C/E, groin sites clear bilaterally Skin: warm/dry no rash   Lab Results:  Recent Labs  02/02/14 1930 02/02/14 2208 02/03/14 0345  WBC 9.4  --  8.0  HGB 11.3* 11.6* 10.5*  PLT 156  --  142*    Recent Labs  02/02/14 2208 02/03/14 0345  NA 143 141  K 3.8 3.2*  CL 105 103  CO2  --  27  GLUCOSE 87 148*  BUN 6 8  CREATININE 0.70 0.78   No results found for this basename: TROPONINI, CK, MB,  in the last 72 hours  Cardiac Studies: 2D Echo pending  Tele: Sinus rhythm, few PAC's  Assessment/Plan:  1. Severe AS s/p TAVR POD #1 2. Hypokalemia 3. HTN 4. Syncope 5. PAF - maintaining sinus rhythm  Pt doing well. D/C cordis and foley catheter. Tx 2W today. Resume warfarin, replete potassium. Check 2D echo. Hold on Procardia as BP currently 120/50. No metoprolol as patient with hx of bradyarrhythmia and post-termination pauses. Mobilize.  Tonny BollmanMichael Dayvon Dax, M.D. 02/03/2014, 8:05 AM

## 2014-02-03 NOTE — Progress Notes (Signed)
CARDIAC REHAB PHASE I   PRE:  Rate/Rhythm: 67 SR    BP: sitting 136/60    SaO2: 88 RA  MODE:  Ambulation: 500 ft   POST:  Rate/Rhythm: 90 SR    BP: sitting 148/60     SaO2: 90 RA  Pt eager to walk. Unsteady upon standing slightly. Used RW and steadier, assist x2 (supervision). Pt SaO2 88-91 RA. Pt c/o "pinching" in knee and groin.  Encouraged IS today. Would benefit from another walk tonight.. Will f/u am. Pt anxious to go home tomorrow.   [phone]  Rebecca LovettReeve, Rebecca [name] CES, ACSM 02/03/2014 2:23 PM

## 2014-02-04 ENCOUNTER — Encounter (HOSPITAL_COMMUNITY): Payer: Self-pay | Admitting: Physician Assistant

## 2014-02-04 ENCOUNTER — Telehealth: Payer: Self-pay | Admitting: Cardiology

## 2014-02-04 DIAGNOSIS — I350 Nonrheumatic aortic (valve) stenosis: Principal | ICD-10-CM

## 2014-02-04 LAB — BASIC METABOLIC PANEL
ANION GAP: 13 (ref 5–15)
BUN: 11 mg/dL (ref 6–23)
CO2: 27 meq/L (ref 19–32)
CREATININE: 0.66 mg/dL (ref 0.50–1.10)
Calcium: 8.5 mg/dL (ref 8.4–10.5)
Chloride: 100 mEq/L (ref 96–112)
GFR calc Af Amer: >90 mL/min (ref >90)
GFR calc non Af Amer: 78 mL/min — ABNORMAL LOW (ref >90)
Glucose, Bld: 122 mg/dL — ABNORMAL HIGH (ref 70–99)
Potassium: 3.8 mEq/L (ref 3.7–5.3)
Sodium: 140 mEq/L (ref 137–147)

## 2014-02-04 LAB — PROTIME-INR
INR: 1.18 (ref 0.00–1.49)
Prothrombin Time: 15 seconds (ref 11.6–15.2)

## 2014-02-04 MED ORDER — WARFARIN SODIUM 5 MG PO TABS
5.0000 mg | ORAL_TABLET | Freq: Once | ORAL | Status: DC
  Filled 2014-02-04: qty 1

## 2014-02-04 MED ORDER — BISACODYL 5 MG PO TBEC: 5.0000 mg | DELAYED_RELEASE_TABLET | Freq: Every day | ORAL | Status: DC | PRN

## 2014-02-04 MED ORDER — OXYCODONE HCL 5 MG PO TABS: 5.0000 mg | ORAL_TABLET | ORAL | Status: DC | PRN

## 2014-02-04 MED ORDER — BISACODYL 10 MG RE SUPP: 10.0000 mg | Freq: Every day | RECTAL | Status: DC | PRN

## 2014-02-04 MED ORDER — DOCUSATE SODIUM 100 MG PO CAPS: 200.0000 mg | ORAL_CAPSULE | Freq: Two times a day (BID) | ORAL | Status: DC

## 2014-02-04 MED FILL — Dextrose Inj 5%: INTRAVENOUS | Qty: 250 | Status: AC

## 2014-02-04 MED FILL — Dexmedetomidine HCl IV Soln 200 MCG/2ML: INTRAVENOUS | Qty: 2 | Status: AC

## 2014-02-04 MED FILL — Magnesium Sulfate Inj 50%: INTRAMUSCULAR | Qty: 10 | Status: AC

## 2014-02-04 MED FILL — Norepinephrine Bitartrate IV Soln 1 MG/ML (Base Equivalent): INTRAVENOUS | Qty: 4 | Status: AC

## 2014-02-04 MED FILL — Heparin Sodium (Porcine) Inj 1000 Unit/ML: INTRAMUSCULAR | Qty: 30 | Status: AC

## 2014-02-04 MED FILL — Potassium Chloride Inj 2 mEq/ML: INTRAVENOUS | Qty: 40 | Status: AC

## 2014-02-04 NOTE — Progress Notes (Signed)
CARDIAC REHAB PHASE I   PRE:  Rate/Rhythm: 87 SR    BP: sitting 140/60    SaO2: 91 RA  MODE:  Ambulation: 550 ft   POST:  Rate/Rhythm: 110 ST    BP: sitting 130/73     SaO2: 90-91 RA  Tolerated well. Steadier today. SaO2 still low normal. Pt needs to practice IS more. Inspiring 500 ml, struggles with to do mechanics correctly. Needs to use RW at home. She is aware of this. Not interested in CRPII. Gave walking gl. [phone]  Rebecca LovettReeve, Godfrey Tritschler DalhartKristan CES, ACSM 02/04/2014 10:36 AM

## 2014-02-04 NOTE — Progress Notes (Signed)
02/04/2014 2:49 PM Nursing note Discharge avs form, medications already taken today and those due this evening given and explained to patient, husband and daughter. INR check and new coumadin regimen reviewed. RX given and reviewed. Follow up appointments, incision site care, activity restrictions and when to call MD reviewed. D/c iv line. D/c tele. D/c home per orders.  Lee Kalt, Blanchard KelchStephanie Ingold

## 2014-02-04 NOTE — Progress Notes (Signed)
ANTICOAGULATION CONSULT NOTE - Initial Consult  Pharmacy Consult for Coumadin with heparin bridge (sq px) Indication: atrial fibrillation  No Known Allergies  Patient Measurements: Height: 5\' 3"  (160 cm) Weight: 121 lb 4.1 oz (55 kg) IBW/kg (Calculated) : 52.4  Vital Signs: Temp: 98.3 F (36.8 C) (10/08 0624) Temp Source: Oral (10/08 0624) BP: 136/52 mmHg (10/08 0926) Pulse Rate: 87 (10/08 0926)  Labs:  Recent Labs  02/02/14 0927  02/02/14 1400  02/02/14 1930 02/02/14 2208 02/03/14 0345 02/04/14 0355  HGB  --   < > 11.3*  < > 11.3* 11.6* 10.5*  --   HCT  --   < > 34.7*  < > 34.2* 34.0* 31.2*  --   PLT  --   --  164  --  156  --  142*  --   APTT  --   --  33  --   --   --   --   --   LABPROT 14.4  --  16.2*  --   --   --   --  15.0  INR 1.12  --  1.30  --   --   --   --  1.18  CREATININE  --   < >  --   < > 0.64 0.70 0.78  --   < > = values in this interval not displayed.  Estimated Creatinine Clearance: 42.5 ml/min (by C-G formula based on Cr of 0.78).   Medical History: Past Medical History  Diagnosis Date  . Hypertension   . High cholesterol   . UTI (lower urinary tract infection)   . Aortic stenosis   . Syncope 08/21/2012  . Paroxysmal atrial fibrillation   . Age-related macular degeneration, wet, right eye   . Heart murmur     "slight"  . Dementia     "slight"/daughter 12/02/2013  . S/P TAVR (transcatheter aortic valve replacement) 02/02/2014    23 mm Edwards Sapien XT transcatheter heart valve placed via open right transfemoral approach    Medications:  Prescriptions prior to admission  Medication Sig Dispense Refill  . atorvastatin (LIPITOR) 20 MG tablet Take 20 mg by mouth daily.      [name]. Besifloxacin HCl (BESIVANCE) 0.6 % SUSP Place 1 drop into the right eye 3 (three) times daily. Only use for 3 days after eye injections      . calcium citrate (CALCITRATE - DOSED IN MG ELEMENTAL CALCIUM) 950 MG tablet Take 1 tablet by mouth daily.      [name]. donepezil  (ARICEPT) 10 MG tablet Take 1 tablet (10 mg total) by mouth daily.  30 tablet  12  . Multiple Vitamin (MULTIVITAMIN) capsule Take 1 capsule by mouth daily.      [name]. NIFEdipine (PROCARDIA XL/ADALAT-CC) 90 MG 24 hr tablet Take 90 mg by mouth daily.      [name]. warfarin (COUMADIN) 2.5 MG tablet Take 2.5-3.75 mg by mouth daily. Take 3.75 mg every Monday & thurs then take 2.5 mg the rest of the week        Assessment: [age] YOF with severe aortic stenosis s/p TAVR POD #2. Pharmacy consulted to resume warfarin for atrial fibrillation with sq heparin for VTE prophylaxis until INR therapeutic. INR  1.18. H/H and plt are low. Likely ABLA.   Home Coumadin dose: 3.75 mg on Mon/Thur; 2.5 mg on all other days   Goal of Therapy:  INR 2-3 Monitor platelets by anticoagulation protocol: Yes   Plan:  1) Give Coumadin  5 mg x 1 dose today, if goes home, recommend to resume home dose tomorrow and INR f/u Friday or Monday. 2) Start heparin 5000 units [place] until INR > 2  3) Monitor daily PT/INR and s/s of bleeding   Bayard Hugger, PharmD, BCPS  Clinical Pharmacist  Pager: [phone]

## 2014-02-04 NOTE — Care Management Note (Signed)
    Page 1 of 1   02/04/2014     2:20:25 PM CARE MANAGEMENT NOTE 02/04/2014  Patient:  Granda,Udell   Account Number:  [id number]  Date Initiated:  02/04/2014  Documentation initiated by:  Seerat Peaden  Subjective/Objective Assessment:   Pt s/p TAVR on 02/02/14.  PTA, pt independent, lives with spouse.     Action/Plan:   Pt for dc home today.  Denies any needs for home.  Has RW at home, if needed.   Anticipated DC Date:  02/04/2014   Anticipated DC Plan:  HOME/SELF CARE      DC Planning Services  CM consult      Choice offered to / List presented to:             Status of service:  Completed, signed off Medicare Important Message given?  NA - LOS <3 / Initial given by admissions (If response is "NO", the following Medicare IM given date fields will be blank) Date Medicare IM given:   Medicare IM given by:   Date Additional Medicare IM given:   Additional Medicare IM given by:    Discharge Disposition:  HOME/SELF CARE  Per UR Regulation:  Reviewed for med. necessity/level of care/duration of stay  If discussed at Long Length of Stay Meetings, dates discussed:    Comments:

## 2014-02-04 NOTE — Discharge Summary (Signed)
CARDIOLOGY DISCHARGE SUMMARY   Patient ID: Girtha Kilgore MRN: [id number] DOB/AGE: Apr 12, 1926 80 y.o.  Admit date: 02/02/2014 Discharge date: 02/04/2014  PCP:  [name], MD Primary Cardiologist: Dr. [name], Dr. Excell Seltzer  Primary Discharge Diagnosis:  Severe aortic stenosis -   S/P TAVR (transcatheter aortic valve replacement) - Edwards Sapien XT THV (size 23 mm, model # 9300TFX, serial # [id number]) on 10/06  Secondary Discharge Diagnosis:  Chronic anticoagulation HTN Hypokalemia Anemia - postop Thrombocytopenia - postoperative, mild  Consults: Dr. Cornelius Moras, TCTS  Procedures: Transcatheter Aortic Valve Replacement - Open Right Transfemoral Approach, intraoperative TEE, 2-D echocardiogram   Hospital Course: Sedra Morfin is a 80 y.o. female with a history of severe aortic stenosis. She was evaluated by Dr. Excell Seltzer and Dr. Cornelius Moras. She was felt to be at acceptable operative risk for TAVR, and was admitted for the procedure on 10/06.  She had the procedure on 10/06 with Dr. Excell Seltzer and Dr. Cornelius Moras operating and Dr. Delton See assisting with a TEE. She tolerated the procedure well.  She was extubated in the OR and progressed well after that. She had an echocardiogram on 10/07, results below. Her valve was well seated with no perivalvular leak. She was seen by cardiac rehabilitation and begin increasing her ambulation. She had some fatigue and weakness with activity, but was overall doing well.  She had some hypokalemia post operatively and was supplemented. She was mildly anemic after the surgery but her MCV was within normal limits and her hemoglobin and hematocrit were stable. She had mild thrombocytopenia but no acute bleeding issues.  On 10/08, she was seen by Dr. Excell Seltzer, Dr. Cornelius Moras and cardiac rehabilitation. She had no signs or symptoms of volume overload. Her operative site was without hematoma or bleeding. She was able to ambulate greater than 500 feet. She has assistance at home. No further  inpatient workup is indicated and she is considered stable for discharge, to follow up as an outpatient.  Labs:   Lab Results  Component Value Date   WBC 8.0 02/03/2014   HGB 10.5* 02/03/2014   HCT 31.2* 02/03/2014   MCV 87.9 02/03/2014   PLT 142* 02/03/2014    Recent Labs Lab 01/29/14 1503  02/04/14 1225  NA 138  < > 140  K 3.8  < > 3.8  CL 99  < > 100  CO2 22  < > 27  BUN 13  < > 11  CREATININE 0.78  < > 0.66  CALCIUM 9.4  < > 8.5  PROT 7.6  --   --   BILITOT 0.5  --   --   ALKPHOS 101  --   --   ALT 20  --   --   AST 25  --   --   GLUCOSE 118*  < > 122*  < > = values in this interval not displayed.   Recent Labs  02/04/14 0355  INR 1.18      Radiology:Dg Chest Portable 1 View In Am 02/03/2014   CLINICAL DATA:  Postop aortic valve replacement, shortness of breath with weakness  EXAM: PORTABLE CHEST - 1 VIEW  COMPARISON:  Portable chest x-ray of 02/02/2014  FINDINGS: The Swan-Ganz catheter has been removed and a venous sheath remains overlying the region of the upper SVC. No pneumothorax is noted. Mild basilar atelectasis is noted on the left. Cardiomegaly is stable  IMPRESSION: Swan-Ganz catheter removed. Mild left basilar atelectasis. No pneumothorax.   Electronically Signed   By: Renae Fickle  Gery PrayBarry M.D.   On: 02/03/2014 08:20   Dg Chest Portable 1 View 02/02/2014   CLINICAL DATA:  Aortic valve replacement. Initial evaluation postop.  EXAM: PORTABLE CHEST - 1 VIEW  COMPARISON:  Chest x-ray 01/29/2014.  FINDINGS: Swan-Ganz catheter noted with its tip in the pulmonary outflow tract. Mediastinum hilar structures are normal . Mild cardiomegaly, normal pulmonary vascularity. Aortic valve replacement. No focal infiltrate. No pleural effusion or pneumothorax.  IMPRESSION: 1. Swan-Ganz catheter noted with tip projected over pulmonary outflow tract. 2. Prior aortic valve replacement. Mild cardiomegaly, no CHF. No acute pulmonary disease.   Electronically Signed   By: Maisie Fushomas  Register   On:  02/02/2014 15:05   EKG: 02/03/2014 Sinus rhythm, no acute ischemic changes Vent. rate 77 BPM PR interval 142 ms QRS duration 120 ms QT/QTc 426/482 ms P-R-T axes 43 -37 96  Echo: 02/03/2014 Study Conclusions - Left ventricle: E/e&'>20 suggestive of elevated LV filling pressure. The cavity size was normal. There was mild concentric hypertrophy. Systolic function was normal. The estimated ejection fraction was in the range of 55% to 60%. Wall motion was normal; there were no regional wall motion abnormalities. There was an increased relative contribution of atrial contraction to ventricular filling. Doppler parameters are consistent with abnormal left ventricular relaxation (grade 1 diastolic dysfunction). - Aortic valve: S/P TAVR with AVR well seated with no perivalvular AI. Mean AV gradient is 10mmHg. Valve area (VTI): 1.59 cm^2. Valve area (Vmax): 1.54 cm^2. Valve area (Vmean): 1.62 cm^2. - Mitral valve: Severely calcified annulus. Mild diffuse thickening and calcification, with mild involvement of chords. There was mild regurgitation. - Pulmonic valve: There was trivial regurgitation. - Pulmonary arteries: PA peak pressure: 43 mm Hg (S). Impressions: - The right ventricular systolic pressure was increased consistent with moderate pulmonary hypertension.    FOLLOW UP PLANS AND APPOINTMENTS No Known Allergies   Medication List    STOP taking these medications       NIFEdipine 90 MG 24 hr tablet  Commonly known as:  PROCARDIA XL/ADALAT-CC      TAKE these medications       atorvastatin 20 MG tablet  Commonly known as:  LIPITOR  Take 20 mg by mouth daily.     BESIVANCE 0.6 % Susp  Generic drug:  Besifloxacin HCl  Place 1 drop into the right eye 3 (three) times daily. Only use for 3 days after eye injections     bisacodyl 5 MG EC tablet  Commonly known as:  bisacodyl  Take 1 tablet (5 mg total) by mouth daily as needed for mild constipation or moderate  constipation.     calcium citrate 950 MG tablet  Commonly known as:  CALCITRATE - dosed in mg elemental calcium  Take 1 tablet by mouth daily.     docusate sodium 100 MG capsule  Commonly known as:  COLACE  Take 2 capsules (200 mg total) by mouth 2 (two) times daily. Take to prevent constipation. Hold for diarrhea.     donepezil 10 MG tablet  Commonly known as:  ARICEPT  Take 1 tablet (10 mg total) by mouth daily.     multivitamin capsule  Take 1 capsule by mouth daily.     oxyCODONE 5 MG immediate release tablet  Commonly known as:  Oxy IR/ROXICODONE  Take 1-2 tablets (5-10 mg total) by mouth every 3 (three) hours as needed for severe pain.     warfarin 2.5 MG tablet  Commonly known as:  COUMADIN  Take 2.5-3.75 mg  by mouth daily. Take 3.75 mg every Monday & thurs then take 2.5 mg the rest of the week        Discharge Instructions   Diet - low sodium heart healthy    Complete by:  As directed      Increase activity slowly    Complete by:  As directed           Follow-up Information   Follow up with Peter [name], MD. (Coumadin check and followup appointment with Dr. [name] or a PA/NP next week, the office will call.)    Specialty:  Cardiology   Contact information:   [address] [place] [address] [phone]       Follow up with Tonny Bollman, MD. (2-D echocardiogram and a followup appointment in 30 days, the office will call.)    Specialty:  Cardiology   Contact information:   [address] [place] [address] [phone]       BRING ALL MEDICATIONS WITH YOU TO FOLLOW UP APPOINTMENTS  Time spent with patient to include physician time: 38 min Signed: Theodore Demark, PA-C 02/04/2014, 2:23 PM Co-Sign MD

## 2014-02-04 NOTE — Progress Notes (Signed)
Patient Name: Rebecca [name] Date of Encounter: 02/04/2014  Principal Problem:   S/P TAVR (transcatheter aortic valve replacement) Active Problems:   Severe aortic stenosis    Patient Profile: 80 yo female w/ hx AS, admitted for TAVR 10/06.  SUBJECTIVE: Feels well, ambulated > 500 ft this am. Husband at home with her during the day, daughter there at night.   OBJECTIVE Filed Vitals:   02/03/14 1339 02/03/14 1929 02/04/14 0624 02/04/14 0926  BP: 130/53 132/50 144/68 136/52  Pulse: 68 72 89 87  Temp: 98.5 F (36.9 C) 98.5 F (36.9 C) 98.3 F (36.8 C)   TempSrc: Oral Oral Oral   Resp: 18 18 18    Height:      Weight:   121 lb 4.1 oz (55 kg)   SpO2: 95% 90% 93%     Intake/Output Summary (Last 24 hours) at 02/04/14 1010 Last data filed at 02/04/14 0740  Gross per 24 hour  Intake    530 ml  Output      0 ml  Net    530 ml   Filed Weights   02/02/14 0902 02/03/14 0500 02/04/14 0624  Weight: 123 lb (55.792 kg) 125 lb (56.7 kg) 121 lb 4.1 oz (55 kg)    PHYSICAL EXAM General: Well developed, well nourished, female in no acute distress. Head: Normocephalic, atraumatic.  Neck: Supple without bruits, JVD not elevated. Lungs:  Resp regular and unlabored, few rales, good air exchange. Heart: RRR, S1, S2, no S3, S4, short murmur; no rub. Abdomen: Soft, non-tender, non-distended, BS + x 4.  Extremities: No clubbing, cyanosis, no edema.  Neuro: Alert and oriented X 3. Moves all extremities spontaneously. Psych: Normal affect.  LABS: CBC: Recent Labs  02/02/14 1930 02/02/14 2208 02/03/14 0345  WBC 9.4  --  8.0  HGB 11.3* 11.6* 10.5*  HCT 34.2* 34.0* 31.2*  MCV 89.3  --  87.9  PLT 156  --  142*   INR: Recent Labs  02/04/14 0355  INR 1.18   Basic Metabolic Panel: Recent Labs  02/02/14 2208 02/03/14 0345  NA 143 141  K 3.8 3.2*  CL 105 103  CO2  --  27  GLUCOSE 87 148*  BUN 6 8  CREATININE 0.70 0.78  CALCIUM  --  8.0*  MG  --  1.8   TELE:  SR, ST,  occ PACs, HR > 120 at times, ?with ambulation.    Radiology/Studies: Dg Chest Portable 1 View In Am 02/03/2014   CLINICAL DATA:  Postop aortic valve replacement, shortness of breath with weakness  EXAM: PORTABLE CHEST - 1 VIEW  COMPARISON:  Portable chest x-ray of 02/02/2014  FINDINGS: The Swan-Ganz catheter has been removed and a venous sheath remains overlying the region of the upper SVC. No pneumothorax is noted. Mild basilar atelectasis is noted on the left. Cardiomegaly is stable  IMPRESSION: Swan-Ganz catheter removed. Mild left basilar atelectasis. No pneumothorax.   Electronically Signed   By: Dwyane DeePaul  Barry M.D.   On: 02/03/2014 08:20   Dg Chest Portable 1 View 02/02/2014   CLINICAL DATA:  Aortic valve replacement. Initial evaluation postop.  EXAM: PORTABLE CHEST - 1 VIEW  COMPARISON:  Chest x-ray 01/29/2014.  FINDINGS: Swan-Ganz catheter noted with its tip in the pulmonary outflow tract. Mediastinum hilar structures are normal . Mild cardiomegaly, normal pulmonary vascularity. Aortic valve replacement. No focal infiltrate. No pleural effusion or pneumothorax.  IMPRESSION: 1. Swan-Ganz catheter noted with tip projected over pulmonary outflow tract.  2. Prior aortic valve replacement. Mild cardiomegaly, no CHF. No acute pulmonary disease.   Electronically Signed   By: Maisie Fus  Register   On: 02/02/2014 15:05     Current Medications:  . antiseptic oral rinse  7 mL Mouth Rinse BID  . aspirin EC  81 mg Oral Daily  . atorvastatin  20 mg Oral q1800  . cefUROXime (ZINACEF)  IV  1.5 g Intravenous Q12H  . donepezil  10 mg Oral QHS  . heparin subcutaneous  5,000 Units Subcutaneous 3 times per day  . pantoprazole  40 mg Oral Daily  . potassium chloride  20 mEq Oral Daily  . sodium chloride  3 mL Intravenous Q12H  . Warfarin - Pharmacist Dosing Inpatient   Does not apply q1800      ASSESSMENT AND PLAN: Principal Problem:   S/P TAVR (transcatheter aortic valve replacement) - s/p Edwards Sapien XT THV  (size 23 mm, model # 9300TFX, serial # [id number]) on 10/06. Doing well post-op  Active Problems:   Severe aortic stenosis - see above.    Chronic anticoagulation - coumadin restarted 10/07, follow. Does not require bridging w/ heparin/lovenox    HTN - SBP 120s-140s now, PTA on Procardia 90 mg daily    Hypokalemia - K+ 3.2 yesterday, will recheck today. S/p 20 meq x 1 10/07. Not on KDur PTA  Plan - d/c today, f/u on BMET.  Signed, Theodore Demark , PA-C 10:10 AM 02/04/2014  Patient seen, examined. Available data reviewed. Agree with findings, assessment, and plan as outlined by Theodore Demark, PA-C. The patient was independently interviewed and examined. Heart is regular rate and rhythm with a grade 2/6 systolic ejection murmur at the right upper sternal border. Extremities are without edema. Lungs are clear. Telemetry shows sinus rhythm. The patient's only complaint is constipation. We'll give her a laxative today. Will update a metabolic panels and she was hypokalemic yesterday. Anticipate discharge home this afternoon. Discussed followup plans with her daughter. Will arrange for a Coumadin clinic visit early next week in day PA/NP transition of care visit next week. She will then have a 30 day echocardiogram and valve clinic appointment. As her BP is normal with a wide pulse pressure at present, will hold Procardia XL until she is seen in followup. Consider restarting that medication next week depending on her blood pressure.  Tonny Bollman, M.D. 02/04/2014 10:43 AM

## 2014-02-04 NOTE — Discharge Instructions (Signed)
PLEASE REMEMBER TO BRING ALL OF YOUR MEDICATIONS TO EACH OF YOUR FOLLOW-UP OFFICE VISITS.  PLEASE ATTEND ALL SCHEDULED FOLLOW-UP APPOINTMENTS.   Activity: Increase activity slowly as tolerated. You may shower, but no soaking baths (or swimming) for 1 week. No driving for 7 days. No lifting over 5 lbs for 1 week. No sexual activity for 1 week.   Wound Care: You may wash cath site gently with soap and water. Keep cath site clean and dry. If you notice pain, swelling, bleeding or pus at your cath site, please call [phone].     Site Care Refer to this sheet in the next few weeks. These instructions provide you with information on caring for yourself after your procedure. Your caregiver may also give you more specific instructions. Your treatment has been planned according to current medical practices, but problems sometimes occur. Call your caregiver if you have any problems or questions after your procedure. HOME CARE INSTRUCTIONS  You may shower 24 hours after the procedure. Remove the bandage (dressing) and gently wash the site with plain soap and water. Gently pat the site dry.   Do not apply powder or lotion to the site.   Do not sit in a bathtub, swimming pool, or whirlpool for 5 to 7 days.   No bending, squatting, or lifting anything over 10 pounds (4.5 kg) as directed by your caregiver.   Inspect the site at least twice daily.   Do not drive home if you are discharged the same day of the procedure. Have someone else drive you.   You may drive 24 hours after the procedure unless otherwise instructed by your caregiver.  What to expect:  Any bruising will usually fade within 1 to 2 weeks.   Blood that collects in the tissue (hematoma) may be painful to the touch. It should usually decrease in size and tenderness within 1 to 2 weeks.  SEEK IMMEDIATE MEDICAL CARE IF:  You have unusual pain at the site or down the affected limb.   You have redness, warmth, swelling, or pain at the  site.   You have drainage (other than a small amount of blood on the dressing).   You have chills.   You have a fever or persistent symptoms for more than 72 hours.   You have a fever and your symptoms suddenly get worse.   Your leg becomes pale, cool, tingly, or numb.   You have heavy bleeding from the site. Hold pressure on the site.  Document Released: 05/19/2010 Document Revised: 04/05/2011 Document Reviewed: 05/19/2010 [facility] Patient Information 2012 [place], [place].

## 2014-02-04 NOTE — Telephone Encounter (Signed)
Closed encounter °

## 2014-02-04 NOTE — Progress Notes (Addendum)
[address]       [place] [id number]             [id number]          2 Days Post-Op Procedure(s) (LRB): TRANSCATHETER AORTIC VALVE REPLACEMENT, TRANSFEMORAL (N/A) INTRAOPERATIVE TRANSESOPHAGEAL ECHOCARDIOGRAM (N/A)  Subjective: Comfortable, still gets weak with activity, but overall feels well.   Objective: Vital signs in last 24 hours: Patient Vitals for the past 24 hrs:  BP Temp Temp src Pulse Resp SpO2 Weight  02/04/14 0624 144/68 mmHg 98.3 F (36.8 C) Oral 89 18 93 % 121 lb 4.1 oz (55 kg)  02/03/14 1929 132/50 mmHg 98.5 F (36.9 C) Oral 72 18 90 % -  02/03/14 1339 130/53 mmHg 98.5 F (36.9 C) Oral 68 18 95 % -  02/03/14 1201 155/54 mmHg 98.3 F (36.8 C) Oral 69 17 94 % -  02/03/14 1000 - - - 70 20 95 % -  02/03/14 0900 124/49 mmHg - - 73 15 95 % -  02/03/14 0800 130/51 mmHg - - 79 19 99 % -   Current Weight  02/04/14 121 lb 4.1 oz (55 kg)     Intake/Output from previous day: 10/07 0701 - 10/08 0700 In: 410 [P.O.:360; IV Piggyback:50] Out: 125 [Urine:125]    PHYSICAL EXAM:  Heart: RRR Lungs: Clear Wound: Clean and dry Extremities: No edema    Lab Results: CBC: Recent Labs  02/02/14 1930 02/02/14 2208 02/03/14 0345  WBC 9.4  --  8.0  HGB 11.3* 11.6* 10.5*  HCT 34.2* 34.0* 31.2*  PLT 156  --  142*   BMET:  Recent Labs  02/02/14 2208 02/03/14 0345  NA 143 141  K 3.8 3.2*  CL 105 103  CO2  --  27  GLUCOSE 87 148*  BUN 6 8  CREATININE 0.70 0.78  CALCIUM  --  8.0*    PT/INR:  Recent Labs  02/04/14 0355  LABPROT 15.0  INR 1.18   Echo (10/7): Study Conclusions  - Left ventricle: E/e&'>20 suggestive of elevated LV filling pressure. The cavity size was normal. There was mild concentric hypertrophy. Systolic function was normal. The estimated ejection fraction was in the range of 55% to 60%. Wall motion was normal; there were no regional wall motion abnormalities. There was an increased relative  contribution of atrial contraction to ventricular filling. Doppler parameters are consistent with abnormal left ventricular relaxation (grade 1 diastolic dysfunction). - Aortic valve: S/P TAVR with AVR well seated with no perivalvular AI. Mean AV gradient is 10mmHg. Valve area (VTI): 1.59 cm^2. Valve area (Vmax): 1.54 cm^2. Valve area (Vmean): 1.62 cm^2. - Mitral valve: Severely calcified annulus. Mild diffuse thickening and calcification, with mild involvement of chords. There was mild regurgitation. - Pulmonic valve: There was trivial regurgitation. - Pulmonary arteries: PA peak pressure: 43 mm Hg (S).  Impressions:  - The right ventricular systolic pressure was increased consistent with moderate pulmonary hypertension.     Assessment/Plan: S/P Procedure(s) (LRB): TRANSCATHETER AORTIC VALVE REPLACEMENT, TRANSFEMORAL (N/A) INTRAOPERATIVE TRANSESOPHAGEAL ECHOCARDIOGRAM (N/A)  CV- maintaining SR, BPs starting to trend up.  Resume Procardia at cardiology's discretion. No beta blocker due to h/o bradycardia.  Coumadin restarted. Echo stable with no perivalvular leak.  Mobilize, continue rehab.   LOS: 2 days    COLLINS,GINA H 02/04/2014  I have seen and examined the patient and agree with the assessment and plan as outlined.  Looks good.  Groin incision clean and dry.  Potentially ready for d/c soon.   Sharece Fleischhacker H 02/04/2014 8:33 AM

## 2014-02-05 ENCOUNTER — Encounter (HOSPITAL_COMMUNITY): Payer: Self-pay | Admitting: Cardiovascular Disease

## 2014-02-05 LAB — TYPE AND SCREEN
ABO/RH(D): A NEG
Antibody Screen: NEGATIVE
UNIT DIVISION: 0
UNIT DIVISION: 0
UNIT DIVISION: 0
UNIT DIVISION: 0
Unit division: 0
Unit division: 0

## 2014-02-09 ENCOUNTER — Encounter: Payer: Self-pay | Admitting: Cardiology

## 2014-02-09 ENCOUNTER — Ambulatory Visit (INDEPENDENT_AMBULATORY_CARE_PROVIDER_SITE_OTHER): Payer: Medicare Other | Admitting: Cardiology

## 2014-02-09 ENCOUNTER — Ambulatory Visit (INDEPENDENT_AMBULATORY_CARE_PROVIDER_SITE_OTHER): Payer: Medicare Other | Admitting: *Deleted

## 2014-02-09 VITALS — BP 150/88 | HR 71 | Ht 62.0 in | Wt 121.2 lb

## 2014-02-09 DIAGNOSIS — Z954 Presence of other heart-valve replacement: Secondary | ICD-10-CM

## 2014-02-09 DIAGNOSIS — I480 Paroxysmal atrial fibrillation: Secondary | ICD-10-CM

## 2014-02-09 DIAGNOSIS — Z952 Presence of prosthetic heart valve: Secondary | ICD-10-CM

## 2014-02-09 DIAGNOSIS — Z5181 Encounter for therapeutic drug level monitoring: Secondary | ICD-10-CM

## 2014-02-09 LAB — POCT INR: INR: 1.1

## 2014-02-09 NOTE — Patient Instructions (Signed)
No changes have been made today in your current medications or treatment plan.  Please keep these future appointments:   1. Dr Excell Seltzerooper on 03/04/2014 at 10:45a   2. Dr SwazilandJordan on 04/28/2014 at 3:45p

## 2014-02-09 NOTE — Progress Notes (Signed)
Patient ID: Rebecca RoyaltyJennie [name], female   DOB: 12-06-1925, 80 y.o.   MRN: [id number]    02/09/2014 Rebecca [name]   12-06-1925  [id number]  Primary Physicia  Duane Lopeoss, Alan, MD Primary Cardiologist: Dr. SwazilandJordan  HPI:  The patient is an 80 year old female followed by Dr. SwazilandJordan, with a history of severe aortic stenosis status post TAVR by Dr. Excell Seltzerooper and Dr. Cornelius Moraswen 02/02/2014. She tolerated the procedure well without complication. She presents to clinic today for post-hospital followup. She is accompanied by both her husband and her daughter. She looks remarkably well and she denies any major issues since discharge. Prior to undergoing her procedure, her main symptom was syncope. Since undergoing the procedure, she denies any recurrent syncopal/near syncopal episodes. She also denies chest pain or dyspnea. No orthopnea, PND or lower extremity edema. Her right femoral access site is well healed and free of complication. She denies any anterior groin, flank and low back pain.  She is also on chronic warfarin therapy. Her INR was checked in clinic today and is subtherapeutic at 1.1. She reports full compliance with her warfarin. She states that she has been eating broccoli frequently with her meals.   Current Outpatient Prescriptions  Medication Sig Dispense Refill  . atorvastatin (LIPITOR) 20 MG tablet Take 20 mg by mouth daily.      [name]. Besifloxacin HCl (BESIVANCE) 0.6 % SUSP Place 1 drop into the right eye 3 (three) times daily. Only use for 3 days after eye injections      . bisacodyl (BISACODYL) 5 MG EC tablet Take 1 tablet (5 mg total) by mouth daily as needed for mild constipation or moderate constipation.    0  . calcium citrate (CALCITRATE - DOSED IN MG ELEMENTAL CALCIUM) 950 MG tablet Take 1 tablet by mouth daily.      [name]. docusate sodium (COLACE) 100 MG capsule Take 2 capsules (200 mg total) by mouth 2 (two) times daily. Take to prevent constipation. Hold for diarrhea.    0  . donepezil (ARICEPT) 10 MG  tablet Take 1 tablet (10 mg total) by mouth daily.  30 tablet  12  . Multiple Vitamin (MULTIVITAMIN) capsule Take 1 capsule by mouth daily.      [name]. oxyCODONE (OXY IR/ROXICODONE) 5 MG immediate release tablet Take 1-2 tablets (5-10 mg total) by mouth every 3 (three) hours as needed for severe pain.  10 tablet  0  . warfarin (COUMADIN) 2.5 MG tablet Take 2.5-3.75 mg by mouth daily. Take 3.75 mg every Monday & thurs then take 2.5 mg the rest of the week       No current facility-administered medications for this visit.    No Known Allergies  History   Social History  . Marital Status: Married    Spouse Name: [name]    Number of Children: 3  . Years of Education: 11th   Occupational History  . Retired    Social History Main Topics  . Smoking status: Former Smoker -- 0.40 packs/day for 30 years    Types: Cigarettes    Quit date: 04/30/1960  . Smokeless tobacco: Never Used  . Alcohol Use: No  . Drug Use: No  . Sexual Activity: Not on file   Other Topics Concern  . Not on file   Social History Narrative   Pt lives at home with her daughter.   Married 65 yrs.   Caffeine Use: 2-3 cups daily     Review of Systems: General: negative for chills, fever, night sweats  or weight changes.  Cardiovascular: negative for chest pain, dyspnea on exertion, edema, orthopnea, palpitations, paroxysmal nocturnal dyspnea or shortness of breath Dermatological: negative for rash Respiratory: negative for cough or wheezing Urologic: negative for hematuria Abdominal: negative for nausea, vomiting, diarrhea, bright red blood per rectum, melena, or hematemesis Neurologic: negative for visual changes, syncope, or dizziness All other systems reviewed and are otherwise negative except as noted above.    Blood pressure 150/88, pulse 71, height 5\' 2"  (1.575 m), weight 121 lb 3.2 oz (54.976 kg).  General appearance: alert, cooperative and no distress Neck: no carotid bruit and no JVD Lungs: clear to  auscultation bilaterally Heart: regular rate and rhythm and 3/6 SM Extremities: no LEE Pulses: 2+ and symmetric Skin: warm and dry Neurologic: Grossly normal  EKG NSR 71 bmp. LBB  ASSESSMENT AND PLAN:   1. Severe Aortic stenosis: 1 week status post TAVR. Doing well without any major issues. No recurrent syncope. No chest pain or dyspnea. EKG demonstrates normal sinus rhythm. Heart rate and blood pressure both control. She is scheduled for 30 day followup with Dr. Excell Seltzerooper as well as a 30 day echocardiogram.  2. chronic warfarin therapy: INR today is subtherapeutic at 1.1. Our office pharmacist was notified of the result. New dosing recommendations were given. Patient was instructed to take 3.75 mg x4 days then resume 2.5 mg daily with plans to repeat INR in one week. She was instructed to avoid consumption of foods high in vitamin K, particularly broccoli. She was also provided a list of foods high in vitamin K in which she should avoid.  PLAN  Keep 30 day post-TAVR appointment with Dr. Excell Seltzerooper. She already has followup scheduled with her primary cardiologist, Dr. SwazilandJordan, on 04/28/2014. Patient was instructed to keep this appointment.  SIMMONS, BRITTAINYPA-C 02/09/2014 2:44 PM

## 2014-02-11 ENCOUNTER — Encounter (HOSPITAL_COMMUNITY): Payer: Self-pay | Admitting: Cardiovascular Disease

## 2014-02-15 ENCOUNTER — Ambulatory Visit (INDEPENDENT_AMBULATORY_CARE_PROVIDER_SITE_OTHER): Payer: Medicare Other | Admitting: Pharmacist Clinician (PhC)/ Clinical Pharmacy Specialist

## 2014-02-15 ENCOUNTER — Ambulatory Visit (HOSPITAL_COMMUNITY)
Admission: RE | Admit: 2014-02-15 | Discharge: 2014-02-15 | Disposition: A | Discharge status: Home / Self Care | Payer: Medicare Other | Source: Ambulatory Visit | Attending: Cardiology | Admitting: Cardiology

## 2014-02-15 ENCOUNTER — Telehealth: Payer: Self-pay | Admitting: Cardiovascular Disease

## 2014-02-15 DIAGNOSIS — Z7901 Long term (current) use of anticoagulants: Secondary | ICD-10-CM | POA: Diagnosis not present

## 2014-02-15 DIAGNOSIS — I4891 Unspecified atrial fibrillation: Secondary | ICD-10-CM | POA: Diagnosis not present

## 2014-02-15 DIAGNOSIS — R1030 Lower abdominal pain, unspecified: Secondary | ICD-10-CM | POA: Diagnosis not present

## 2014-02-15 DIAGNOSIS — R1031 Right lower quadrant pain: Secondary | ICD-10-CM

## 2014-02-15 DIAGNOSIS — Z5181 Encounter for therapeutic drug level monitoring: Secondary | ICD-10-CM | POA: Diagnosis not present

## 2014-02-15 DIAGNOSIS — I480 Paroxysmal atrial fibrillation: Secondary | ICD-10-CM | POA: Diagnosis not present

## 2014-02-15 DIAGNOSIS — I729 Aneurysm of unspecified site: Secondary | ICD-10-CM | POA: Insufficient documentation

## 2014-02-15 LAB — POCT INR: INR: 1.2

## 2014-02-15 NOTE — Telephone Encounter (Signed)
New Massage     Pt's daughter Corrie DandyMary wanting Dr. Excell Seltzerooper to give her a call back in regards to pt having trouble walking due to knee since she had a TAVR procedure. Pt is unable to sleep due to the pain and the daughter would like to know if this is normal and what they should do about it. Please call back and advise.

## 2014-02-15 NOTE — Progress Notes (Signed)
Right Groin Duplex Completed. No evidence for a pseudoaneurysm. Brianna L Mazza,RVT

## 2014-02-15 NOTE — Addendum Note (Signed)
Addended by: Meda KlinefelterPUGH, Alquan Morrish JOHNSON D on: 02/15/2014 10:34 AM   Modules accepted: Orders

## 2014-02-15 NOTE — Telephone Encounter (Signed)
Pt states the discomfort from her right knee to her right groin has gotten worse since discharge from hospital 02/04/14. She states  her mobility is limited because of the pain. Pt denies bruising at site of right transfemoral approach to TAVR but does state she feels some pressure in her right groin. Pt denies numbness,tingling or discoloration in her right foot or toes.  I reviewed with Dr Patty SermonsBrackbill and he recommended pt have right groin site checked today. [facility]10[facility] so Dr Pat[facility] SermonsBrackbill [facility]. I spoke with Elnita Maxwellheryl [facility] and follow up will be [facility].  I spoke with patient and made her aware of this.  She asked me to call her daughter, Corrie DandyMary and advise her of this plan.  I spoke with Corrie DandyMary and she verbalized understanding.

## 2014-02-15 NOTE — Telephone Encounter (Signed)
Pt states that she is having pain from her right knee to her right groin.  She states this started when she left the hospital 02/04/14.

## 2014-02-15 NOTE — Telephone Encounter (Signed)
Returned call to patient's daughter Corrie DandyMary spoke to DOD Dr.Harding he advised needs to have doppler to check rt groin.Doppler scheduled at 2:30 pm today.

## 2014-02-19 DIAGNOSIS — N1330 Unspecified hydronephrosis: Secondary | ICD-10-CM | POA: Diagnosis not present

## 2014-02-22 ENCOUNTER — Ambulatory Visit (INDEPENDENT_AMBULATORY_CARE_PROVIDER_SITE_OTHER): Payer: Medicare Other | Admitting: Pharmacist Clinician (PhC)/ Clinical Pharmacy Specialist

## 2014-02-22 DIAGNOSIS — I480 Paroxysmal atrial fibrillation: Secondary | ICD-10-CM | POA: Diagnosis not present

## 2014-02-22 DIAGNOSIS — Z5181 Encounter for therapeutic drug level monitoring: Secondary | ICD-10-CM | POA: Diagnosis not present

## 2014-02-22 DIAGNOSIS — I4891 Unspecified atrial fibrillation: Secondary | ICD-10-CM | POA: Diagnosis not present

## 2014-02-22 DIAGNOSIS — Z7901 Long term (current) use of anticoagulants: Secondary | ICD-10-CM | POA: Diagnosis not present

## 2014-02-22 LAB — POCT INR: INR: 4.2

## 2014-03-01 ENCOUNTER — Encounter (INDEPENDENT_AMBULATORY_CARE_PROVIDER_SITE_OTHER): Payer: Medicare Other | Admitting: Ophthalmology

## 2014-03-01 DIAGNOSIS — I10 Essential (primary) hypertension: Secondary | ICD-10-CM | POA: Diagnosis not present

## 2014-03-01 DIAGNOSIS — H43813 Vitreous degeneration, bilateral: Secondary | ICD-10-CM | POA: Diagnosis not present

## 2014-03-01 DIAGNOSIS — H35033 Hypertensive retinopathy, bilateral: Secondary | ICD-10-CM | POA: Diagnosis not present

## 2014-03-01 DIAGNOSIS — H3532 Exudative age-related macular degeneration: Secondary | ICD-10-CM

## 2014-03-01 DIAGNOSIS — H3531 Nonexudative age-related macular degeneration: Secondary | ICD-10-CM | POA: Diagnosis not present

## 2014-03-03 ENCOUNTER — Ambulatory Visit (INDEPENDENT_AMBULATORY_CARE_PROVIDER_SITE_OTHER): Payer: Medicare Other | Admitting: Pharmacist Clinician (PhC)/ Clinical Pharmacy Specialist

## 2014-03-03 DIAGNOSIS — Z7901 Long term (current) use of anticoagulants: Secondary | ICD-10-CM | POA: Diagnosis not present

## 2014-03-03 DIAGNOSIS — I4891 Unspecified atrial fibrillation: Secondary | ICD-10-CM | POA: Diagnosis not present

## 2014-03-03 DIAGNOSIS — I480 Paroxysmal atrial fibrillation: Secondary | ICD-10-CM | POA: Diagnosis not present

## 2014-03-03 DIAGNOSIS — Z5181 Encounter for therapeutic drug level monitoring: Secondary | ICD-10-CM | POA: Diagnosis not present

## 2014-03-03 LAB — POCT INR: INR: 2.1

## 2014-03-04 ENCOUNTER — Encounter: Payer: Self-pay | Admitting: Cardiovascular Disease

## 2014-03-04 ENCOUNTER — Ambulatory Visit (INDEPENDENT_AMBULATORY_CARE_PROVIDER_SITE_OTHER): Payer: Medicare Other | Admitting: Cardiovascular Disease

## 2014-03-04 ENCOUNTER — Other Ambulatory Visit (HOSPITAL_COMMUNITY): Payer: Self-pay | Admitting: Cardiovascular Disease

## 2014-03-04 ENCOUNTER — Ambulatory Visit (HOSPITAL_COMMUNITY): Payer: Medicare Other | Attending: Cardiology | Admitting: Radiology

## 2014-03-04 VITALS — BP 158/72 | HR 82 | Ht 62.0 in | Wt 118.8 lb

## 2014-03-04 DIAGNOSIS — Z954 Presence of other heart-valve replacement: Secondary | ICD-10-CM

## 2014-03-04 DIAGNOSIS — Z952 Presence of prosthetic heart valve: Secondary | ICD-10-CM

## 2014-03-04 DIAGNOSIS — I359 Nonrheumatic aortic valve disorder, unspecified: Secondary | ICD-10-CM | POA: Diagnosis not present

## 2014-03-04 DIAGNOSIS — Z87891 Personal history of nicotine dependence: Secondary | ICD-10-CM | POA: Insufficient documentation

## 2014-03-04 DIAGNOSIS — I350 Nonrheumatic aortic (valve) stenosis: Secondary | ICD-10-CM | POA: Diagnosis not present

## 2014-03-04 NOTE — Patient Instructions (Signed)
Your physician recommends that you continue on your current medications as directed. Please refer to the Current Medication list given to you today.  Your physician wants you to follow-up in: 1 YEAR with Dr Excell Seltzerooper.  You will receive a reminder letter in the mail two months in advance. If you don't receive a letter, please call our office to schedule the follow-up appointment.  Your physician has requested that you have an echocardiogram in 1 YEAR. Echocardiography is a painless test that uses sound waves to create images of your heart. It provides your doctor with information about the size and shape of your heart and how well your heart's chambers and valves are working. This procedure takes approximately one hour. There are no restrictions for this procedure.

## 2014-03-04 NOTE — Progress Notes (Signed)
Echocardiogram performed.  

## 2014-03-05 ENCOUNTER — Encounter: Payer: Self-pay | Admitting: Cardiovascular Disease

## 2014-03-05 NOTE — Progress Notes (Signed)
HPI:  80 year-old woman presenting for follow-up after undergoing 49TAVR via a transfemoral approach 02/02/2014. She was treated with a 23 mm Sapien XT valve.  The patient feels well. She denies chest pain or shortness of breath. She complains of discomfort in her right leg at the site of her femoral cutdown. The pain is slowly improving. She denies right leg weakness or loss of sensation. No other complaints today.  Studies:  2D Echo: Left ventricle: The cavity size was normal. There was moderate focal basal and mild concentric hypertrophy. Systolic function was normal. The estimated ejection fraction was in the range of 55% to 60%. Wall motion was normal; there were no regional wall motion abnormalities. There was an increased relative contribution of atrial contraction to ventricular filling. Doppler parameters are consistent with abnormal left ventricular relaxation (grade 1 diastolic dysfunction).  ------------------------------------------------------------------- Aortic valve:  Trileaflet; normal thickness leaflets. Mobility was not restricted. Doppler: Transvalvular velocity was within the normal range. There was no stenosis. There was trivial perivalvular regurgitation.   VTI ratio of LVOT to aortic valve: 0.48. Mean velocity ratio of LVOT to aortic valve: 0.45.  Mean gradient (S): 14 mm Hg.  ------------------------------------------------------------------- Aorta: Aortic root: The aortic root was normal in size.  ------------------------------------------------------------------- Mitral valve:  Severely calcified annulus. Moderate diffuse thickening and calcification, with mild involvement of chords. Mobility was not restricted. Doppler: Transvalvular velocity was within the normal range. There was no evidence for stenosis. There was mild regurgitation.  Peak gradient (D): 3 mm Hg.  ------------------------------------------------------------------- Left  atrium: The atrium was normal in size.  ------------------------------------------------------------------- Right ventricle: The cavity size was normal. Wall thickness was normal. Systolic function was normal.  ------------------------------------------------------------------- Pulmonic valve:  Structurally normal valve.  Cusp separation was normal. Doppler: Transvalvular velocity was within the normal range. There was no evidence for stenosis. There was no regurgitation.  ------------------------------------------------------------------- Tricuspid valve:  Structurally normal valve.  Doppler: Transvalvular velocity was within the normal range. There was mild regurgitation.  ------------------------------------------------------------------- Pulmonary artery:  The main pulmonary artery was normal-sized. Systolic pressure was within the normal range.  ------------------------------------------------------------------- Right atrium: The atrium was normal in size.  ------------------------------------------------------------------- Pericardium: There was no pericardial effusion.  ------------------------------------------------------------------- Systemic veins: Inferior vena cava: The vessel was normal in size.   Outpatient Encounter Prescriptions as of 03/04/2014  Medication Sig  . atorvastatin (LIPITOR) 20 MG tablet Take 20 mg by mouth daily.  [name]. Besifloxacin HCl (BESIVANCE) 0.6 % SUSP Place 1 drop into the right eye 3 (three) times daily. Only use for 3 days after eye injections  . bisacodyl (BISACODYL) 5 MG EC tablet Take 1 tablet (5 mg total) by mouth daily as needed for mild constipation or moderate constipation.  . calcium citrate (CALCITRATE - DOSED IN MG ELEMENTAL CALCIUM) 950 MG tablet Take 1 tablet by mouth daily.  [name]. docusate sodium (COLACE) 100 MG capsule Take 2 capsules (200 mg total) by mouth 2 (two) times daily. Take to prevent constipation. Hold for  diarrhea.  . donepezil (ARICEPT) 10 MG tablet Take 1 tablet (10 mg total) by mouth daily.  . Multiple Vitamin (MULTIVITAMIN) capsule Take 1 capsule by mouth daily.  [name]. NIFEdipine (PROCARDIA XL/ADALAT-CC) 90 MG 24 hr tablet   . oxyCODONE (OXY IR/ROXICODONE) 5 MG immediate release tablet Take 1-2 tablets (5-10 mg total) by mouth every 3 (three) hours as needed for severe pain.  [name]. warfarin (COUMADIN) 2.5 MG tablet Take 2.5-3.75 mg by mouth daily. Take 3.75 mg every Monday & thurs  then take 2.5 mg the rest of the week    No Known Allergies  Past Medical History  Diagnosis Date  . Hypertension   . High cholesterol   . UTI (lower urinary tract infection)   . Aortic stenosis   . Syncope 08/21/2012  . Paroxysmal atrial fibrillation   . Age-related macular degeneration, wet, right eye   . Heart murmur     "slight"  . Dementia     "slight"/daughter 12/02/2013  . S/P TAVR (transcatheter aortic valve replacement) 02/02/2014    23 mm Edwards Sapien XT transcatheter heart valve placed via open right transfemoral approach    family history includes Cerebral aneurysm in her sister; Heart Problems in her father; Heart attack in her mother.   ROS: Negative except as per HPI  BP 158/72 mmHg  Pulse 82  Ht 5\' 2"  (1.575 m)  Wt 118 lb 12.8 oz (53.887 kg)  BMI 21.72 kg/m2  PHYSICAL EXAM: Pt is alert and oriented, pleasant elderly woman in NAD HEENT: normal Neck: JVP - normal, carotids 2+= without bruits Lungs: CTA bilaterally CV: RRR with 2/6 SEM at the LSB, no diastolic murmur Abd: soft, NT, Positive BS, no hepatomegaly Ext: no C/C/E, distal pulses intact and equal, bilateral groin sites/right groin incision clear without hematoma or ecchymosis.  Skin: warm/dry no rash  ASSESSMENT AND PLAN: 1. Aortic valve disorder status post TAVR 2. HTN, essential 3. PAF 4. Mild dementia 5. Chronic diastolic heart failure, currently with New York Heart Association functional class I symptoms.  The patient  is progressing well. She is tolerating warfarin. Blood pressure is elevated today but only to have been good. Her right groin site appears to be healing and I do not see any evidence of mass or infection. I think her symptoms will continue to improve. She is scheduled to see Dr. SwazilandJordan next month. Plan to see her back for a 1 year follow-up visit with an echocardiogram at that time. Today's echocardiogram was reviewed and demonstrates normal bioprosthetic valve function without significant aortic insufficiency.  Tonny BollmanMichael Alder Murri, MD 03/05/2014 2:46 PM

## 2014-03-22 ENCOUNTER — Ambulatory Visit (INDEPENDENT_AMBULATORY_CARE_PROVIDER_SITE_OTHER): Payer: Medicare Other | Admitting: Pharmacist Clinician (PhC)/ Clinical Pharmacy Specialist

## 2014-03-22 DIAGNOSIS — I480 Paroxysmal atrial fibrillation: Secondary | ICD-10-CM

## 2014-03-22 DIAGNOSIS — I4891 Unspecified atrial fibrillation: Secondary | ICD-10-CM | POA: Diagnosis not present

## 2014-03-22 DIAGNOSIS — Z7901 Long term (current) use of anticoagulants: Secondary | ICD-10-CM | POA: Diagnosis not present

## 2014-03-22 DIAGNOSIS — Z5181 Encounter for therapeutic drug level monitoring: Secondary | ICD-10-CM | POA: Diagnosis not present

## 2014-03-22 LAB — POCT INR: INR: 1.9

## 2014-04-01 ENCOUNTER — Other Ambulatory Visit: Payer: Self-pay | Admitting: *Deleted

## 2014-04-01 MED ORDER — WARFARIN SODIUM 2.5 MG PO TABS: 2.5000 mg | ORAL_TABLET | ORAL | Status: DC

## 2014-04-08 ENCOUNTER — Encounter (HOSPITAL_COMMUNITY): Payer: Self-pay | Admitting: Cardiology

## 2014-04-12 ENCOUNTER — Encounter (INDEPENDENT_AMBULATORY_CARE_PROVIDER_SITE_OTHER): Payer: Medicare Other | Admitting: Ophthalmology

## 2014-04-12 DIAGNOSIS — H2512 Age-related nuclear cataract, left eye: Secondary | ICD-10-CM

## 2014-04-12 DIAGNOSIS — H3532 Exudative age-related macular degeneration: Secondary | ICD-10-CM

## 2014-04-12 DIAGNOSIS — H35033 Hypertensive retinopathy, bilateral: Secondary | ICD-10-CM | POA: Diagnosis not present

## 2014-04-12 DIAGNOSIS — H43813 Vitreous degeneration, bilateral: Secondary | ICD-10-CM | POA: Diagnosis not present

## 2014-04-12 DIAGNOSIS — H3531 Nonexudative age-related macular degeneration: Secondary | ICD-10-CM | POA: Diagnosis not present

## 2014-04-12 DIAGNOSIS — I10 Essential (primary) hypertension: Secondary | ICD-10-CM

## 2014-04-28 ENCOUNTER — Ambulatory Visit (INDEPENDENT_AMBULATORY_CARE_PROVIDER_SITE_OTHER): Payer: Medicare Other | Admitting: Pharmacist Clinician (PhC)/ Clinical Pharmacy Specialist

## 2014-04-28 ENCOUNTER — Encounter: Payer: Self-pay | Admitting: Cardiology

## 2014-04-28 ENCOUNTER — Ambulatory Visit (INDEPENDENT_AMBULATORY_CARE_PROVIDER_SITE_OTHER): Payer: Medicare Other | Admitting: Cardiology

## 2014-04-28 VITALS — BP 140/74 | HR 82 | Ht 63.0 in | Wt 122.4 lb

## 2014-04-28 DIAGNOSIS — Z7901 Long term (current) use of anticoagulants: Secondary | ICD-10-CM

## 2014-04-28 DIAGNOSIS — Z5181 Encounter for therapeutic drug level monitoring: Secondary | ICD-10-CM

## 2014-04-28 DIAGNOSIS — Z952 Presence of prosthetic heart valve: Secondary | ICD-10-CM

## 2014-04-28 DIAGNOSIS — I10 Essential (primary) hypertension: Secondary | ICD-10-CM

## 2014-04-28 DIAGNOSIS — I480 Paroxysmal atrial fibrillation: Secondary | ICD-10-CM | POA: Diagnosis not present

## 2014-04-28 DIAGNOSIS — Z954 Presence of other heart-valve replacement: Secondary | ICD-10-CM | POA: Diagnosis not present

## 2014-04-28 DIAGNOSIS — I4891 Unspecified atrial fibrillation: Secondary | ICD-10-CM

## 2014-04-28 DIAGNOSIS — I350 Nonrheumatic aortic (valve) stenosis: Secondary | ICD-10-CM | POA: Diagnosis not present

## 2014-04-28 LAB — POCT INR: INR: 1.2

## 2014-04-28 NOTE — Patient Instructions (Signed)
Continue your current therapy  I will see you again in 6 months.   

## 2014-04-28 NOTE — Progress Notes (Signed)
Knox RoyaltyJennie Reish Date of Birth: [birth year] Medical Record #[id number]  History of Present Illness: Mrs. Rebecca [name] is seen for followup AVR. She has paroxysmal atrial fibrillation and severe aortic stenosis. She is  on Coumadin. She is s/p TAVR on 02/02/14 from a transfemoral approach. She did very well from this procedure. Follow up Echo in November was very good. She denies any chest pain or SOB. No dizziness. Stays very active. She has some discomfort of her anterior right thigh since her procedure. US was negative.   Current Outpatient Prescriptions on File Prior to Visit  Medication Sig Dispense Refill  . atorvastatin (LIPITOR) 20 MG tablet Take 20 mg by mouth daily.    [name]. Besifloxacin HCl (BESIVANCE) 0.6 % SUSP Place 1 drop into the right eye 3 (three) times daily. Only use for 3 days after eye injections    . bisacodyl (BISACODYL) 5 MG EC tablet Take 1 tablet (5 mg total) by mouth daily as needed for mild constipation or moderate constipation.  0  . calcium citrate (CALCITRATE - DOSED IN MG ELEMENTAL CALCIUM) 950 MG tablet Take 1 tablet by mouth daily.    [name]. docusate sodium (COLACE) 100 MG capsule Take 2 capsules (200 mg total) by mouth 2 (two) times daily. Take to prevent constipation. Hold for diarrhea.  0  . donepezil (ARICEPT) 10 MG tablet Take 1 tablet (10 mg total) by mouth daily. 30 tablet 12  . Multiple Vitamin (MULTIVITAMIN) capsule Take 1 capsule by mouth daily.    [name]. NIFEdipine (PROCARDIA XL/ADALAT-CC) 90 MG 24 hr tablet   1  . oxyCODONE (OXY IR/ROXICODONE) 5 MG immediate release tablet Take 1-2 tablets (5-10 mg total) by mouth every 3 (three) hours as needed for severe pain. 10 tablet 0  . warfarin (COUMADIN) 2.5 MG tablet Take 1 tablet (2.5 mg total) by mouth as directed. 40 tablet 3   No current facility-administered medications on file prior to visit.    No Known Allergies  Past Medical History  Diagnosis Date  . Hypertension   . High cholesterol   . UTI (lower urinary tract  infection)   . Aortic stenosis   . Syncope 08/21/2012  . Paroxysmal atrial fibrillation   . Age-related macular degeneration, wet, right eye   . Heart murmur     "slight"  . Dementia     "slight"/daughter 12/02/2013  . S/P TAVR (transcatheter aortic valve replacement) 02/02/2014    23 mm Edwards Sapien XT transcatheter heart valve placed via open right transfemoral approach    Past Surgical History  Procedure Laterality Date  . Tonsillectomy    . Cardiac catheterization  04/2013  . Eye surgery      cataracts  . Transcatheter aortic valve replacement, transfemoral  02/02/2014    Edwards Sapien XT THV (size 23 mm, model # 9300TFX, serial # [id number])  . Transcatheter aortic valve replacement, transfemoral N/A 02/02/2014    Procedure: TRANSCATHETER AORTIC VALVE REPLACEMENT, TRANSFEMORAL;  Surgeon: Tonny BollmanMichael Cooper, MD;  [facility];  Service: Open Heart Surgery;  Laterality: N/A;  . Intraoperative transesophageal echocardiogram N/A 02/02/2014    Procedure: INTRAOPERATIVE TRANSESOPHAGEAL ECHOCARDIOGRAM;  Surgeon: Tonny BollmanMichael Cooper, MD;  [facility];  Service: Open Heart Surgery;  Laterality: N/A;  . Left and right heart catheterization with coronary angiogram N/A 05/25/2013    Procedure: LEFT AND RIGHT HEART CATHETERIZATION WITH CORONARY ANGIOGRAM;  Surgeon: Kiyanna Biegler M SwazilandJordan, MD;  [facility];  Service: Cardiovascular;  Laterality: N/A;    History  Smoking  status  . Former Smoker -- 0.40 packs/day for 30 years  . Types: Cigarettes  . Quit date: 04/30/1960  Smokeless tobacco  . Never Used    History  Alcohol Use No    Family History  Problem Relation Age of Onset  . Heart attack Mother   . Heart Problems Father   . Cerebral aneurysm Sister     Review of Systems: The review of systems is positive for chronic memory loss. She is on Aricept.  All other systems were reviewed and are negative.  Physical Exam: BP 140/74 mmHg  Pulse 82  Ht 5\' 3"  (1.6 m)  Wt 122 lb 6.4 oz  (55.52 kg)  BMI 21.69 kg/m2 She is a pleasant elderly white female in no acute distress. HEENT: Normocephalic, atraumatic. Pupils are equal round and reactive to light accommodation. Extraocular movements are full. Oropharynx is clear. There is good dental care. Neck is supple without JVD, adenopathy, or thyromegaly. Carotid upstrokes are normal. Lungs: Clear Cardiovascular: Regular rate and rhythm. Normal S1 with diminished S2. There is a soft grade 1-2/6 systolic murmur at the right upper sternal border. Abdomen: Soft and nontender. No masses or bruits. Bowel sounds positive. Extremities: Normal radial, femoral, and pedal pulses. No cyanosis or edema. Bruising in the right groin without hematoma or bruit. Skin: Dry Neuro: Alert and oriented x3. Cranial nerves II through XII are intact.  LABORATORY DATA:  Lab Results  Component Value Date   WBC 8.0 02/03/2014   HGB 10.5* 02/03/2014   HCT 31.2* 02/03/2014   PLT 142* 02/03/2014   GLUCOSE 122* 02/04/2014   ALT 20 01/29/2014   AST 25 01/29/2014   NA 140 02/04/2014   K 3.8 02/04/2014   CL 100 02/04/2014   CREATININE 0.66 02/04/2014   BUN 11 02/04/2014   CO2 27 02/04/2014   TSH 1.400 12/02/2013   INR 1.2 04/28/2014   HGBA1C 6.0* 01/29/2014    Assessment / Plan: 1.  Aortic stenosis: Severe. Now s/p TAVR with excellent result.   2. Syncope. Resolved.  3.  Paroxysmal atrial fibrillation. Now on Coumadin. No significant sustained high rates. INR low today and dose adjusted  4. Hyperlipidemia.  5. Hypertension-controlled  6. Chronic diastolic CHF well compensated.   I will follow up in 6 months.

## 2014-05-10 ENCOUNTER — Ambulatory Visit (INDEPENDENT_AMBULATORY_CARE_PROVIDER_SITE_OTHER): Payer: Medicare Other | Admitting: Pharmacist Clinician (PhC)/ Clinical Pharmacy Specialist

## 2014-05-10 DIAGNOSIS — Z7901 Long term (current) use of anticoagulants: Secondary | ICD-10-CM | POA: Diagnosis not present

## 2014-05-10 DIAGNOSIS — I4891 Unspecified atrial fibrillation: Secondary | ICD-10-CM | POA: Diagnosis not present

## 2014-05-10 DIAGNOSIS — Z5181 Encounter for therapeutic drug level monitoring: Secondary | ICD-10-CM | POA: Diagnosis not present

## 2014-05-10 DIAGNOSIS — I480 Paroxysmal atrial fibrillation: Secondary | ICD-10-CM

## 2014-05-10 LAB — POCT INR: INR: 5.8

## 2014-05-17 ENCOUNTER — Encounter (INDEPENDENT_AMBULATORY_CARE_PROVIDER_SITE_OTHER): Payer: Medicare Other | Admitting: Ophthalmology

## 2014-05-17 DIAGNOSIS — H3531 Nonexudative age-related macular degeneration: Secondary | ICD-10-CM | POA: Diagnosis not present

## 2014-05-17 DIAGNOSIS — H43813 Vitreous degeneration, bilateral: Secondary | ICD-10-CM | POA: Diagnosis not present

## 2014-05-17 DIAGNOSIS — I10 Essential (primary) hypertension: Secondary | ICD-10-CM

## 2014-05-17 DIAGNOSIS — H3532 Exudative age-related macular degeneration: Secondary | ICD-10-CM

## 2014-05-17 DIAGNOSIS — H35033 Hypertensive retinopathy, bilateral: Secondary | ICD-10-CM

## 2014-05-19 ENCOUNTER — Ambulatory Visit (INDEPENDENT_AMBULATORY_CARE_PROVIDER_SITE_OTHER): Payer: Medicare Other | Admitting: Pharmacist Clinician (PhC)/ Clinical Pharmacy Specialist

## 2014-05-19 DIAGNOSIS — I4891 Unspecified atrial fibrillation: Secondary | ICD-10-CM

## 2014-05-19 DIAGNOSIS — I480 Paroxysmal atrial fibrillation: Secondary | ICD-10-CM | POA: Diagnosis not present

## 2014-05-19 DIAGNOSIS — Z5181 Encounter for therapeutic drug level monitoring: Secondary | ICD-10-CM | POA: Diagnosis not present

## 2014-05-19 DIAGNOSIS — Z7901 Long term (current) use of anticoagulants: Secondary | ICD-10-CM | POA: Diagnosis not present

## 2014-05-19 LAB — POCT INR: INR: 2.4

## 2014-05-20 DIAGNOSIS — N1330 Unspecified hydronephrosis: Secondary | ICD-10-CM | POA: Diagnosis not present

## 2014-05-20 DIAGNOSIS — N281 Cyst of kidney, acquired: Secondary | ICD-10-CM | POA: Diagnosis not present

## 2014-05-20 DIAGNOSIS — N200 Calculus of kidney: Secondary | ICD-10-CM | POA: Diagnosis not present

## 2014-05-20 DIAGNOSIS — K7689 Other specified diseases of liver: Secondary | ICD-10-CM | POA: Diagnosis not present

## 2014-05-24 DIAGNOSIS — N1330 Unspecified hydronephrosis: Secondary | ICD-10-CM | POA: Diagnosis not present

## 2014-05-24 DIAGNOSIS — Q625 Duplication of ureter: Secondary | ICD-10-CM | POA: Diagnosis not present

## 2014-05-31 ENCOUNTER — Ambulatory Visit (INDEPENDENT_AMBULATORY_CARE_PROVIDER_SITE_OTHER): Payer: Medicare Other | Admitting: Pharmacist Clinician (PhC)/ Clinical Pharmacy Specialist

## 2014-05-31 DIAGNOSIS — Z7901 Long term (current) use of anticoagulants: Secondary | ICD-10-CM | POA: Diagnosis not present

## 2014-05-31 DIAGNOSIS — I480 Paroxysmal atrial fibrillation: Secondary | ICD-10-CM

## 2014-05-31 DIAGNOSIS — Z5181 Encounter for therapeutic drug level monitoring: Secondary | ICD-10-CM | POA: Diagnosis not present

## 2014-05-31 DIAGNOSIS — I4891 Unspecified atrial fibrillation: Secondary | ICD-10-CM | POA: Diagnosis not present

## 2014-05-31 LAB — POCT INR: INR: 1.6

## 2014-06-03 ENCOUNTER — Encounter (HOSPITAL_BASED_OUTPATIENT_CLINIC_OR_DEPARTMENT_OTHER): Payer: Self-pay | Admitting: Emergency Medicine

## 2014-06-03 ENCOUNTER — Emergency Department (HOSPITAL_BASED_OUTPATIENT_CLINIC_OR_DEPARTMENT_OTHER): Payer: Medicare Other

## 2014-06-03 ENCOUNTER — Emergency Department (HOSPITAL_BASED_OUTPATIENT_CLINIC_OR_DEPARTMENT_OTHER)
Admission: EM | Admit: 2014-06-03 | Discharge: 2014-06-03 | Disposition: A | Discharge status: Home / Self Care | Payer: Medicare Other | Attending: Emergency Medicine | Admitting: Emergency Medicine

## 2014-06-03 DIAGNOSIS — Z87440 Personal history of urinary (tract) infections: Secondary | ICD-10-CM | POA: Insufficient documentation

## 2014-06-03 DIAGNOSIS — F0390 Unspecified dementia without behavioral disturbance: Secondary | ICD-10-CM | POA: Diagnosis not present

## 2014-06-03 DIAGNOSIS — R1031 Right lower quadrant pain: Secondary | ICD-10-CM | POA: Diagnosis present

## 2014-06-03 DIAGNOSIS — E780 Pure hypercholesterolemia: Secondary | ICD-10-CM | POA: Diagnosis not present

## 2014-06-03 DIAGNOSIS — R011 Cardiac murmur, unspecified: Secondary | ICD-10-CM | POA: Insufficient documentation

## 2014-06-03 DIAGNOSIS — Z7901 Long term (current) use of anticoagulants: Secondary | ICD-10-CM | POA: Diagnosis not present

## 2014-06-03 DIAGNOSIS — N281 Cyst of kidney, acquired: Secondary | ICD-10-CM | POA: Diagnosis not present

## 2014-06-03 DIAGNOSIS — I10 Essential (primary) hypertension: Secondary | ICD-10-CM | POA: Insufficient documentation

## 2014-06-03 DIAGNOSIS — K5730 Diverticulosis of large intestine without perforation or abscess without bleeding: Secondary | ICD-10-CM | POA: Diagnosis not present

## 2014-06-03 DIAGNOSIS — Z79899 Other long term (current) drug therapy: Secondary | ICD-10-CM | POA: Insufficient documentation

## 2014-06-03 DIAGNOSIS — Z87891 Personal history of nicotine dependence: Secondary | ICD-10-CM | POA: Diagnosis not present

## 2014-06-03 DIAGNOSIS — K7689 Other specified diseases of liver: Secondary | ICD-10-CM | POA: Diagnosis not present

## 2014-06-03 DIAGNOSIS — I480 Paroxysmal atrial fibrillation: Secondary | ICD-10-CM | POA: Insufficient documentation

## 2014-06-03 LAB — BASIC METABOLIC PANEL
Anion gap: 7 (ref 5–15)
BUN: 11 mg/dL (ref 6–23)
CO2: 29 mmol/L (ref 19–32)
CREATININE: 0.69 mg/dL (ref 0.50–1.10)
Calcium: 8.9 mg/dL (ref 8.4–10.5)
Chloride: 105 mmol/L (ref 96–112)
GFR calc non Af Amer: 77 mL/min — ABNORMAL LOW (ref >90)
GFR, EST AFRICAN AMERICAN: 89 mL/min — AB (ref >90)
GLUCOSE: 105 mg/dL — AB (ref 70–99)
Potassium: 3.2 mmol/L — ABNORMAL LOW (ref 3.5–5.1)
SODIUM: 141 mmol/L (ref 135–145)

## 2014-06-03 LAB — CBC WITH DIFFERENTIAL/PLATELET
BASOS PCT: 0 % (ref 0–1)
Basophils Absolute: 0 10*3/uL (ref 0.0–0.1)
EOS PCT: 6 % — AB (ref 0–5)
Eosinophils Absolute: 0.4 10*3/uL (ref 0.0–0.7)
HEMATOCRIT: 44.6 % (ref 36.0–46.0)
Hemoglobin: 14.8 g/dL (ref 12.0–15.0)
LYMPHS PCT: 29 % (ref 12–46)
Lymphs Abs: 2.1 10*3/uL (ref 0.7–4.0)
MCH: 28.8 pg (ref 26.0–34.0)
MCHC: 33.2 g/dL (ref 30.0–36.0)
MCV: 86.8 fL (ref 78.0–100.0)
MONO ABS: 0.6 10*3/uL (ref 0.1–1.0)
Monocytes Relative: 8 % (ref 3–12)
NEUTROS ABS: 4.2 10*3/uL (ref 1.7–7.7)
Neutrophils Relative %: 57 % (ref 43–77)
Platelets: 189 10*3/uL (ref 150–400)
RBC: 5.14 MIL/uL — ABNORMAL HIGH (ref 3.87–5.11)
RDW: 14.4 % (ref 11.5–15.5)
WBC: 7.4 10*3/uL (ref 4.0–10.5)

## 2014-06-03 LAB — URINALYSIS, ROUTINE W REFLEX MICROSCOPIC
Bilirubin Urine: NEGATIVE
GLUCOSE, UA: NEGATIVE mg/dL
KETONES UR: NEGATIVE mg/dL
LEUKOCYTES UA: NEGATIVE
Nitrite: NEGATIVE
Protein, ur: NEGATIVE mg/dL
SPECIFIC GRAVITY, URINE: 1.007 (ref 1.005–1.030)
Urobilinogen, UA: 0.2 mg/dL (ref 0.0–1.0)
pH: 7 (ref 5.0–8.0)

## 2014-06-03 LAB — PROTIME-INR
INR: 1.56 — ABNORMAL HIGH (ref 0.00–1.49)
PROTHROMBIN TIME: 18.7 s — AB (ref 11.6–15.2)

## 2014-06-03 LAB — URINE MICROSCOPIC-ADD ON

## 2014-06-03 LAB — LIPASE, BLOOD: Lipase: 28 U/L (ref 11–59)

## 2014-06-03 MED ORDER — MORPHINE SULFATE 4 MG/ML IJ SOLN
4.0000 mg | Freq: Once | INTRAMUSCULAR | Status: AC
  Administered 2014-06-03: 4 mg via INTRAVENOUS
  Filled 2014-06-03: qty 1

## 2014-06-03 MED ORDER — IOHEXOL 300 MG/ML  SOLN
100.0000 mL | Freq: Once | INTRAMUSCULAR | Status: AC | PRN
  Administered 2014-06-03: 100 mL via INTRAVENOUS

## 2014-06-03 MED ORDER — ONDANSETRON HCL 4 MG/2ML IJ SOLN
4.0000 mg | Freq: Once | INTRAMUSCULAR | Status: AC
  Administered 2014-06-03: 4 mg via INTRAVENOUS
  Filled 2014-06-03: qty 2

## 2014-06-03 MED ORDER — IOHEXOL 300 MG/ML  SOLN
25.0000 mL | Freq: Once | INTRAMUSCULAR | Status: AC | PRN
  Administered 2014-06-03: 25 mL via ORAL

## 2014-06-03 MED ORDER — HYDROCODONE-ACETAMINOPHEN 5-325 MG PO TABS: 1.0000 | ORAL_TABLET | ORAL | Status: DC | PRN

## 2014-06-03 MED ORDER — SODIUM CHLORIDE 0.9 % IV SOLN
1000.0000 mL | INTRAVENOUS | Status: DC
  Administered 2014-06-03: 1000 mL via INTRAVENOUS

## 2014-06-03 NOTE — Discharge Instructions (Signed)

## 2014-06-03 NOTE — ED Provider Notes (Addendum)
CSN: [id number]     Arrival date & time 06/03/14  0711 History   First MD Initiated Contact with Patient 06/03/14 0757     Chief Complaint  Patient presents with  . Abdominal Pain    Patient is a 81 y.o. female presenting with abdominal pain. The history is provided by the patient.  Abdominal Pain Pain location:  RLQ Pain quality: aching   Pain radiation: down her legs. Pain severity:  Moderate Duration:  2 days Timing:  Intermittent Chronicity:  New Relieved by:  Nothing Ineffective treatments:  OTC medications Associated symptoms: no chest pain, no constipation, no dysuria, no fever, no hematuria, no nausea and no shortness of breath     Past Medical History  Diagnosis Date  . Hypertension   . High cholesterol   . UTI (lower urinary tract infection)   . Aortic stenosis   . Syncope 08/21/2012  . Paroxysmal atrial fibrillation   . Age-related macular degeneration, wet, right eye   . Heart murmur     "slight"  . Dementia     "slight"/daughter 12/02/2013  . S/P TAVR (transcatheter aortic valve replacement) 02/02/2014    23 mm Edwards Sapien XT transcatheter heart valve placed via open right transfemoral approach   Past Surgical History  Procedure Laterality Date  . Tonsillectomy    . Cardiac catheterization  04/2013  . Eye surgery      cataracts  . Transcatheter aortic valve replacement, transfemoral  02/02/2014    Edwards Sapien XT THV (size 23 mm, model # 9300TFX, serial # [id number])  . Transcatheter aortic valve replacement, transfemoral N/A 02/02/2014    Procedure: TRANSCATHETER AORTIC VALVE REPLACEMENT, TRANSFEMORAL;  Surgeon: Tonny Bollman, MD;  Location: [facility] OR;  Service: Open Heart Surgery;  Laterality: N/A;  . Intraoperative transesophageal echocardiogram N/A 02/02/2014    Procedure: INTRAOPERATIVE TRANSESOPHAGEAL ECHOCARDIOGRAM;  Surgeon: Tonny Bollman, MD;  Location: [facility] OR;  Service: Open Heart Surgery;  Laterality: N/A;  . Left and right heart catheterization with  coronary angiogram N/A 05/25/2013    Procedure: LEFT AND RIGHT HEART CATHETERIZATION WITH CORONARY ANGIOGRAM;  Surgeon: [name] [name], MD;  Location: [facility] CATH LAB;  Service: Cardiovascular;  Laterality: N/A;   Family History  Problem Relation Age of Onset  . Heart attack Mother   . Heart Problems Father   . Cerebral aneurysm Sister    History  Substance Use Topics  . Smoking status: Former Smoker -- 0.40 packs/day for 30 years    Types: Cigarettes    Quit date: 04/30/1960  . Smokeless tobacco: Never Used  . Alcohol Use: No   OB History    No data available     Review of Systems  Constitutional: Negative for fever.  Respiratory: Negative for shortness of breath.   Cardiovascular: Negative for chest pain.  Gastrointestinal: Positive for abdominal pain. Negative for nausea and constipation.  Genitourinary: Negative for dysuria and hematuria.  Musculoskeletal: Positive for back pain (little bit in the lower back).  All other systems reviewed and are negative.     Allergies  Review of patient's allergies indicates no known allergies.  Home Medications   Prior to Admission medications   Medication Sig Start Date End Date Taking? Authorizing Provider  atorvastatin (LIPITOR) 20 MG tablet Take 20 mg by mouth daily.    Historical Provider, MD  Besifloxacin HCl (BESIVANCE) 0.6 % SUSP Place 1 drop into the right eye 3 (three) times daily. Only use for 3 days after eye injections  Historical Provider, MD  bisacodyl (BISACODYL) 5 MG EC tablet Take 1 tablet (5 mg total) by mouth daily as needed for mild constipation or moderate constipation. 02/04/14   Rhonda G Barrett, PA-C  calcium citrate (CALCITRATE - DOSED IN MG ELEMENTAL CALCIUM) 950 MG tablet Take 1 tablet by mouth daily.    Historical Provider, MD  docusate sodium (COLACE) 100 MG capsule Take 2 capsules (200 mg total) by mouth 2 (two) times daily. Take to prevent constipation. Hold for diarrhea. 02/04/14   Rhonda G Barrett, PA-C   donepezil (ARICEPT) 10 MG tablet Take 1 tablet (10 mg total) by mouth daily. 11/16/13   Suanne Marker, MD  HYDROcodone-acetaminophen (NORCO/VICODIN) 5-325 MG per tablet Take 1 tablet by mouth every 4 (four) hours as needed. 06/03/14   Linwood Dibbles, MD  Multiple Vitamin (MULTIVITAMIN) capsule Take 1 capsule by mouth daily.    Historical Provider, MD  NIFEdipine (PROCARDIA XL/ADALAT-CC) 90 MG 24 hr tablet  12/06/13   Historical Provider, MD  oxyCODONE (OXY IR/ROXICODONE) 5 MG immediate release tablet Take 1-2 tablets (5-10 mg total) by mouth every 3 (three) hours as needed for severe pain. 02/04/14   Rhonda G Barrett, PA-C  warfarin (COUMADIN) 2.5 MG tablet Take 1 tablet (2.5 mg total) by mouth as directed. Patient taking differently: Take 2.5 mg by mouth as directed. Pt takes 1.5 tablets on mondays and fridays, 1 tablet daily all other days. 04/01/14   [name] [name], MD   BP 150/91 mmHg  Pulse 67  Temp(Src) 98.1 F (36.7 C) (Oral)  Resp 16  Ht  (1.6 m)  Wt 122 lb (55.339 kg)  BMI 21.62 kg/m2  SpO2 97% Physical Exam  Constitutional: No distress.  HENT:  Head: Normocephalic and atraumatic.  Right Ear: External ear normal.  Left Ear: External ear normal.  Eyes: Conjunctivae are normal. Right eye exhibits no discharge. Left eye exhibits no discharge. No scleral icterus.  Neck: Neck supple. No tracheal deviation present.  Cardiovascular: Normal rate, regular rhythm and intact distal pulses.   Pulses:      Dorsalis pedis pulses are 2+ on the right side, and 2+ on the left side.  Pulmonary/Chest: Effort normal and breath sounds normal. No stridor. No respiratory distress. She has no wheezes. She has no rales.  Abdominal: Soft. Bowel sounds are normal. She exhibits mass. She exhibits no distension. There is tenderness in the right lower quadrant. There is rigidity and CVA tenderness (right side). There is no rebound and no guarding. No hernia.  Musculoskeletal: She exhibits no edema or  tenderness.  Neurological: She is alert. She has normal strength. No cranial nerve deficit (no facial droop, extraocular movements intact, no slurred speech) or sensory deficit. She exhibits normal muscle tone. She displays no seizure activity. Coordination normal.  Skin: Skin is warm and dry. No rash noted. She is not diaphoretic.  Psychiatric: She has a normal mood and affect.  Nursing note and vitals reviewed.   ED Course  Procedures (including critical care time) Labs Review Labs Reviewed  URINALYSIS, ROUTINE W REFLEX MICROSCOPIC - Abnormal; Notable for the following:    Hgb urine dipstick SMALL (*)    All other components within normal limits  PROTIME-INR - Abnormal; Notable for the following:    Prothrombin Time 18.7 (*)    INR 1.56 (*)    All other components within normal limits  BASIC METABOLIC PANEL - Abnormal; Notable for the following:    Potassium 3.2 (*)  Glucose, Bld 105 (*)    GFR calc non Af Amer 77 (*)    GFR calc Af Amer 89 (*)    All other components within normal limits  CBC WITH DIFFERENTIAL/PLATELET - Abnormal; Notable for the following:    RBC 5.14 (*)    Eosinophils Relative 6 (*)    All other components within normal limits  LIPASE, BLOOD  URINE MICROSCOPIC-ADD ON    Imaging Review Ct Abdomen Pelvis W Contrast  06/03/2014   CLINICAL DATA:  Right lower quadrant pain for 2 days, nausea  EXAM: CT ABDOMEN AND PELVIS WITH CONTRAST  TECHNIQUE: Multidetector CT imaging of the abdomen and pelvis was performed using the standard protocol following bolus administration of intravenous contrast.  CONTRAST:  25mL OMNIPAQUE IOHEXOL 300 MG/ML SOLN, 100mL OMNIPAQUE IOHEXOL 300 MG/ML SOLN  COMPARISON:  05/20/2014  FINDINGS: Lung bases are unremarkable. Sagittal images of the spine shows diffuse osteopenia. Stable degenerative changes thoracolumbar spine. Stable multiple hepatic cyst. No intrahepatic biliary ductal dilatation. The pancreas, spleen and adrenal glands are  unremarkable.  No gastric outlet obstruction.  Kidneys are symmetrical in size and enhancement. Again noted duplicated left renal collecting system. There is stable chronic dilatation of lower pole extrarenal pelvis. Stable cyst in lower pole of the left kidney.  Delayed renal images shows bilateral renal symmetrical excretion. Duplicated proximal left ureter again noted. Extensive atherosclerotic calcifications of abdominal aorta and iliac arteries.  No small bowel obstruction. Normal appendix. No pericecal inflammation. The terminal ileum is unremarkable.  Scattered diverticula are noted descending colon. Multiple sigmoid colon diverticula. No evidence of acute diverticulitis. Again noted fat containing lesion with calcification midline posterior or pelvis measures 8.8 by 8.4 cm stable in size from appearance from prior exam. This is consistent with a ovarian dermoid or adult cystic teratoma.  No pelvic ascites or adenopathy.  IMPRESSION: 1. Stable hepatic cysts. 2. Again noted duplicated left renal collecting system. Stable cyst in lower pole of the left kidney. Bilateral duplicated proximal left ureter again noted. 3. No small bowel or colonic obstruction. 4. Normal appendix.  No pericecal inflammation. 5. Again noted colonic diverticula descending colon and sigmoid colon. No evidence of acute diverticulitis. 6. 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   Electronically Signed   By: Natasha MeadLiviu  Pop M.D.   On: 06/03/2014 09:37     MDM   Final diagnoses:  RLQ abdominal pain    The patient's CT scan is otherwise unremarkable. Laboratory tests are reassuring. Pain may be related to a musculoskeletal etiology, questionable lumbar radiculopathy with her discomfort in her legs.  She does not have any acute focal weakness.  Discussed the findings with the patient and her family. Plan to discharge home with medications for pain. Follow  up with her primary doctor.  At this time there does not appear to be any evidence of an acute emergency medical condition and the patient appears stable for discharge with appropriate outpatient follow up.     Linwood DibblesJon Tachina Spoonemore, MD 06/03/14 1018  PE added.   Linwood DibblesJon Evann Erazo, MD 06/08/14 1240

## 2014-06-03 NOTE — ED Notes (Signed)
Pt having right lower abdominal pain x 2 days, worse last night with bilateral leg aching.  Some nausea, no vomiting, no diarrhea, no fever.  Last bm yesterday and was normal.

## 2014-06-21 ENCOUNTER — Ambulatory Visit (INDEPENDENT_AMBULATORY_CARE_PROVIDER_SITE_OTHER): Payer: Medicare Other | Admitting: Pharmacist Clinician (PhC)/ Clinical Pharmacy Specialist

## 2014-06-21 ENCOUNTER — Encounter (INDEPENDENT_AMBULATORY_CARE_PROVIDER_SITE_OTHER): Payer: Medicare Other | Admitting: Ophthalmology

## 2014-06-21 DIAGNOSIS — I480 Paroxysmal atrial fibrillation: Secondary | ICD-10-CM | POA: Diagnosis not present

## 2014-06-21 DIAGNOSIS — I10 Essential (primary) hypertension: Secondary | ICD-10-CM | POA: Diagnosis not present

## 2014-06-21 DIAGNOSIS — Z7901 Long term (current) use of anticoagulants: Secondary | ICD-10-CM | POA: Diagnosis not present

## 2014-06-21 DIAGNOSIS — Z5181 Encounter for therapeutic drug level monitoring: Secondary | ICD-10-CM

## 2014-06-21 DIAGNOSIS — H3531 Nonexudative age-related macular degeneration: Secondary | ICD-10-CM | POA: Diagnosis not present

## 2014-06-21 DIAGNOSIS — I4891 Unspecified atrial fibrillation: Secondary | ICD-10-CM | POA: Diagnosis not present

## 2014-06-21 DIAGNOSIS — H43813 Vitreous degeneration, bilateral: Secondary | ICD-10-CM

## 2014-06-21 DIAGNOSIS — H3532 Exudative age-related macular degeneration: Secondary | ICD-10-CM

## 2014-06-21 DIAGNOSIS — H35033 Hypertensive retinopathy, bilateral: Secondary | ICD-10-CM | POA: Diagnosis not present

## 2014-06-21 LAB — POCT INR: INR: 2.6

## 2014-07-19 ENCOUNTER — Ambulatory Visit (INDEPENDENT_AMBULATORY_CARE_PROVIDER_SITE_OTHER): Payer: Medicare Other | Admitting: Pharmacist Clinician (PhC)/ Clinical Pharmacy Specialist

## 2014-07-19 DIAGNOSIS — I480 Paroxysmal atrial fibrillation: Secondary | ICD-10-CM | POA: Diagnosis not present

## 2014-07-19 DIAGNOSIS — Z5181 Encounter for therapeutic drug level monitoring: Secondary | ICD-10-CM | POA: Diagnosis not present

## 2014-07-19 DIAGNOSIS — Z7901 Long term (current) use of anticoagulants: Secondary | ICD-10-CM | POA: Diagnosis not present

## 2014-07-19 DIAGNOSIS — I4891 Unspecified atrial fibrillation: Secondary | ICD-10-CM

## 2014-07-19 LAB — POCT INR: INR: 2.1

## 2014-08-02 ENCOUNTER — Encounter (INDEPENDENT_AMBULATORY_CARE_PROVIDER_SITE_OTHER): Payer: Medicare Other | Admitting: Ophthalmology

## 2014-08-02 DIAGNOSIS — H35033 Hypertensive retinopathy, bilateral: Secondary | ICD-10-CM | POA: Diagnosis not present

## 2014-08-02 DIAGNOSIS — I10 Essential (primary) hypertension: Secondary | ICD-10-CM | POA: Diagnosis not present

## 2014-08-02 DIAGNOSIS — H43813 Vitreous degeneration, bilateral: Secondary | ICD-10-CM

## 2014-08-02 DIAGNOSIS — H3531 Nonexudative age-related macular degeneration: Secondary | ICD-10-CM | POA: Diagnosis not present

## 2014-08-02 DIAGNOSIS — H3532 Exudative age-related macular degeneration: Secondary | ICD-10-CM

## 2014-08-18 ENCOUNTER — Ambulatory Visit (INDEPENDENT_AMBULATORY_CARE_PROVIDER_SITE_OTHER): Payer: Medicare Other | Admitting: Pharmacist Clinician (PhC)/ Clinical Pharmacy Specialist

## 2014-08-18 DIAGNOSIS — Z5181 Encounter for therapeutic drug level monitoring: Secondary | ICD-10-CM

## 2014-08-18 DIAGNOSIS — Z7901 Long term (current) use of anticoagulants: Secondary | ICD-10-CM | POA: Diagnosis not present

## 2014-08-18 DIAGNOSIS — I480 Paroxysmal atrial fibrillation: Secondary | ICD-10-CM

## 2014-08-18 DIAGNOSIS — I4891 Unspecified atrial fibrillation: Secondary | ICD-10-CM | POA: Diagnosis not present

## 2014-08-18 LAB — POCT INR: INR: 2.3

## 2014-08-26 DIAGNOSIS — R05 Cough: Secondary | ICD-10-CM | POA: Diagnosis not present

## 2014-09-06 ENCOUNTER — Encounter (INDEPENDENT_AMBULATORY_CARE_PROVIDER_SITE_OTHER): Payer: Medicare Other | Admitting: Ophthalmology

## 2014-09-06 DIAGNOSIS — H3531 Nonexudative age-related macular degeneration: Secondary | ICD-10-CM | POA: Diagnosis not present

## 2014-09-06 DIAGNOSIS — H35033 Hypertensive retinopathy, bilateral: Secondary | ICD-10-CM | POA: Diagnosis not present

## 2014-09-06 DIAGNOSIS — I10 Essential (primary) hypertension: Secondary | ICD-10-CM | POA: Diagnosis not present

## 2014-09-06 DIAGNOSIS — H2512 Age-related nuclear cataract, left eye: Secondary | ICD-10-CM

## 2014-09-06 DIAGNOSIS — H3532 Exudative age-related macular degeneration: Secondary | ICD-10-CM | POA: Diagnosis not present

## 2014-09-06 DIAGNOSIS — H43813 Vitreous degeneration, bilateral: Secondary | ICD-10-CM

## 2014-09-13 ENCOUNTER — Ambulatory Visit (INDEPENDENT_AMBULATORY_CARE_PROVIDER_SITE_OTHER): Payer: Medicare Other | Admitting: Pharmacist Clinician (PhC)/ Clinical Pharmacy Specialist

## 2014-09-13 DIAGNOSIS — Z7901 Long term (current) use of anticoagulants: Secondary | ICD-10-CM

## 2014-09-13 DIAGNOSIS — I480 Paroxysmal atrial fibrillation: Secondary | ICD-10-CM

## 2014-09-13 DIAGNOSIS — Z5181 Encounter for therapeutic drug level monitoring: Secondary | ICD-10-CM

## 2014-09-13 DIAGNOSIS — I4891 Unspecified atrial fibrillation: Secondary | ICD-10-CM | POA: Diagnosis not present

## 2014-09-13 LAB — POCT INR: INR: 1.3

## 2014-09-22 ENCOUNTER — Other Ambulatory Visit: Payer: Self-pay

## 2014-09-22 DIAGNOSIS — R932 Abnormal findings on diagnostic imaging of liver and biliary tract: Secondary | ICD-10-CM

## 2014-09-30 ENCOUNTER — Ambulatory Visit (INDEPENDENT_AMBULATORY_CARE_PROVIDER_SITE_OTHER): Payer: Medicare Other | Admitting: Pharmacist Clinician (PhC)/ Clinical Pharmacy Specialist

## 2014-09-30 DIAGNOSIS — Z5181 Encounter for therapeutic drug level monitoring: Secondary | ICD-10-CM

## 2014-09-30 DIAGNOSIS — Z7901 Long term (current) use of anticoagulants: Secondary | ICD-10-CM

## 2014-09-30 DIAGNOSIS — I4891 Unspecified atrial fibrillation: Secondary | ICD-10-CM

## 2014-09-30 DIAGNOSIS — I480 Paroxysmal atrial fibrillation: Secondary | ICD-10-CM | POA: Diagnosis not present

## 2014-09-30 LAB — POCT INR: INR: 1.2

## 2014-09-30 NOTE — Patient Instructions (Signed)
Bring warfarin bottle to next appointment

## 2014-10-01 ENCOUNTER — Telehealth: Payer: Self-pay | Admitting: Cardiology

## 2014-10-01 NOTE — Telephone Encounter (Signed)
New message         Pt calling about a letter she received concerning a test

## 2014-10-01 NOTE — Telephone Encounter (Signed)
Returned call to patient she stated she received a letter in mail about MRA of chest.Advised she is scheduled for a MRA chest at [facility] 10/08/14 arrive a 4:00 pm.NPO 4 hours prior.

## 2014-10-07 ENCOUNTER — Ambulatory Visit (INDEPENDENT_AMBULATORY_CARE_PROVIDER_SITE_OTHER): Payer: Medicare Other | Admitting: Pharmacist Clinician (PhC)/ Clinical Pharmacy Specialist

## 2014-10-07 DIAGNOSIS — Z5181 Encounter for therapeutic drug level monitoring: Secondary | ICD-10-CM | POA: Diagnosis not present

## 2014-10-07 DIAGNOSIS — Z7901 Long term (current) use of anticoagulants: Secondary | ICD-10-CM | POA: Diagnosis not present

## 2014-10-07 DIAGNOSIS — I4891 Unspecified atrial fibrillation: Secondary | ICD-10-CM | POA: Diagnosis not present

## 2014-10-07 DIAGNOSIS — I480 Paroxysmal atrial fibrillation: Secondary | ICD-10-CM

## 2014-10-07 LAB — POCT INR: INR: 2

## 2014-10-08 ENCOUNTER — Ambulatory Visit (HOSPITAL_COMMUNITY): Payer: Medicare Other

## 2014-10-18 ENCOUNTER — Telehealth: Payer: Self-pay | Admitting: Pharmacist Clinician (PhC)/ Clinical Pharmacy Specialist

## 2014-10-18 NOTE — Telephone Encounter (Signed)
No answer when called - left message informing caller I would defer to [facility], she can advise.

## 2014-10-18 NOTE — Telephone Encounter (Signed)
Pt's daughter is calling in to speak with Belenda Cruise about her mother's noncompliance with her medications. Corrie Dandy is wanting to know is there another medication that she can take to where she does not have come in every week to have her coumadin checked. Please call  Thanks

## 2014-10-19 ENCOUNTER — Ambulatory Visit (INDEPENDENT_AMBULATORY_CARE_PROVIDER_SITE_OTHER): Payer: Medicare Other | Admitting: Pharmacist

## 2014-10-19 DIAGNOSIS — Z5181 Encounter for therapeutic drug level monitoring: Secondary | ICD-10-CM | POA: Diagnosis not present

## 2014-10-19 DIAGNOSIS — I480 Paroxysmal atrial fibrillation: Secondary | ICD-10-CM | POA: Diagnosis not present

## 2014-10-19 DIAGNOSIS — I4891 Unspecified atrial fibrillation: Secondary | ICD-10-CM

## 2014-10-19 DIAGNOSIS — Z7901 Long term (current) use of anticoagulants: Secondary | ICD-10-CM

## 2014-10-19 LAB — POCT INR: INR: 1.2

## 2014-10-20 ENCOUNTER — Other Ambulatory Visit (HOSPITAL_COMMUNITY): Payer: Self-pay | Admitting: Cardiovascular Disease

## 2014-10-20 ENCOUNTER — Ambulatory Visit (HOSPITAL_COMMUNITY): Payer: Medicare Other

## 2014-10-20 ENCOUNTER — Ambulatory Visit (HOSPITAL_COMMUNITY)
Admission: RE | Admit: 2014-10-20 | Discharge: 2014-10-20 | Disposition: A | Discharge status: Home / Self Care | Payer: Medicare Other | Source: Ambulatory Visit | Attending: Cardiovascular Disease | Admitting: Cardiovascular Disease

## 2014-10-20 DIAGNOSIS — R935 Abnormal findings on diagnostic imaging of other abdominal regions, including retroperitoneum: Secondary | ICD-10-CM | POA: Diagnosis not present

## 2014-10-20 DIAGNOSIS — K760 Fatty (change of) liver, not elsewhere classified: Secondary | ICD-10-CM | POA: Diagnosis not present

## 2014-10-20 DIAGNOSIS — K7689 Other specified diseases of liver: Secondary | ICD-10-CM | POA: Insufficient documentation

## 2014-10-20 DIAGNOSIS — Q676 Pectus excavatum: Secondary | ICD-10-CM | POA: Insufficient documentation

## 2014-10-20 DIAGNOSIS — R932 Abnormal findings on diagnostic imaging of liver and biliary tract: Secondary | ICD-10-CM | POA: Diagnosis not present

## 2014-10-20 DIAGNOSIS — Q638 Other specified congenital malformations of kidney: Secondary | ICD-10-CM | POA: Diagnosis not present

## 2014-10-20 LAB — CREATININE, SERUM
Creatinine, Ser: 0.88 mg/dL (ref 0.44–1.00)
GFR, EST NON AFRICAN AMERICAN: 58 mL/min — AB (ref >60)

## 2014-10-20 MED ORDER — GADOBENATE DIMEGLUMINE 529 MG/ML IV SOLN
12.0000 mL | Freq: Once | INTRAVENOUS | Status: AC | PRN
  Administered 2014-10-20: 20 mL via INTRAVENOUS

## 2014-10-21 NOTE — Telephone Encounter (Signed)
Spoke with pt's daughter.  Reviewed her past INRs and she has been extremely subtherapeutic the past 6 weeks.  Unfortunately she has had a TAVR so that makes her ability to start a DOAC questionable.  Will send a note to Dr. Excell Seltzer and Dr. [name] to review and get their thoughts on Coumadin versus DOAC for patient.  Once decision is made, will contact daughter with further instructions.

## 2014-10-24 NOTE — Telephone Encounter (Signed)
If her coumadin has been that difficult to regulate then a NOAC would be a reasonable alternative. Given her advanced age and low body weight a lower dose of Eliquis 2.5 mg bid might be safest.  Peter [name] MD, [facility]

## 2014-10-25 ENCOUNTER — Encounter (INDEPENDENT_AMBULATORY_CARE_PROVIDER_SITE_OTHER): Payer: Medicare Other | Admitting: Ophthalmology

## 2014-10-26 ENCOUNTER — Telehealth: Payer: Self-pay | Admitting: Diagnostic Neuroimaging

## 2014-10-26 NOTE — Telephone Encounter (Signed)
Will switch to Eliquis 2.5 mg bid.  Explained dosing to daughter, will have her pull warfarin tablets from home.  Rx sent to [facility].

## 2014-10-26 NOTE — Telephone Encounter (Signed)
Rebecca [name], pt's daughter called wanting to speak with Dr. Marjory LiesPenumalli to see if he can possibly order an MRI or some type of scan to see how much worse the patient's dementia has gotten. Please call and advise. Rebecca [name] ca  Be reached @ [phone]

## 2014-10-26 NOTE — Telephone Encounter (Signed)
Spoke to daughter, she believes mom has not been taking meds, pt very upset because husband is now in palliative care.  Will [place] to [place]

## 2014-10-27 ENCOUNTER — Other Ambulatory Visit: Payer: Self-pay | Admitting: Pharmacist Clinician (PhC)/ Clinical Pharmacy Specialist

## 2014-10-27 ENCOUNTER — Other Ambulatory Visit: Payer: Self-pay | Admitting: *Deleted

## 2014-10-27 MED ORDER — APIXABAN 2.5 MG PO TABS: 2.5000 mg | ORAL_TABLET | Freq: Two times a day (BID) | ORAL | Status: DC

## 2014-10-27 NOTE — Telephone Encounter (Signed)
Called and left a message for the daughter informing her that Dr. Marjory LiesPenumalli does not check for progression or monitor progression of dementia with MRI, he does that with MMSE and neruo exam. I informed her of the appt on 11/11/14 at 0300 pm and asked that the pt be here at 0230 pm or a few minutes later and stated that we would check those things then. I asked her to call back with any further issues

## 2014-10-27 NOTE — Telephone Encounter (Signed)
Spoke to the pts daughter on the phone, she returned my call and did not listen to my voicemail. I explained to her what I have written below. She thanked me for being so prompt and calling

## 2014-10-28 ENCOUNTER — Encounter (INDEPENDENT_AMBULATORY_CARE_PROVIDER_SITE_OTHER): Payer: Medicare Other | Admitting: Ophthalmology

## 2014-10-28 DIAGNOSIS — H35033 Hypertensive retinopathy, bilateral: Secondary | ICD-10-CM | POA: Diagnosis not present

## 2014-10-28 DIAGNOSIS — H43813 Vitreous degeneration, bilateral: Secondary | ICD-10-CM | POA: Diagnosis not present

## 2014-10-28 DIAGNOSIS — H3532 Exudative age-related macular degeneration: Secondary | ICD-10-CM

## 2014-10-28 DIAGNOSIS — I10 Essential (primary) hypertension: Secondary | ICD-10-CM | POA: Diagnosis not present

## 2014-10-28 DIAGNOSIS — H3531 Nonexudative age-related macular degeneration: Secondary | ICD-10-CM

## 2014-11-11 ENCOUNTER — Encounter: Payer: Self-pay | Admitting: Diagnostic Neuroimaging

## 2014-11-11 ENCOUNTER — Ambulatory Visit (INDEPENDENT_AMBULATORY_CARE_PROVIDER_SITE_OTHER): Payer: Medicare Other | Admitting: Diagnostic Neuroimaging

## 2014-11-11 VITALS — BP 156/86 | HR 83 | Wt 121.4 lb

## 2014-11-11 DIAGNOSIS — F0390 Unspecified dementia without behavioral disturbance: Secondary | ICD-10-CM

## 2014-11-11 DIAGNOSIS — F03A0 Unspecified dementia, mild, without behavioral disturbance, psychotic disturbance, mood disturbance, and anxiety: Secondary | ICD-10-CM

## 2014-11-11 MED ORDER — MEMANTINE HCL 10 MG PO TABS: 10.0000 mg | ORAL_TABLET | Freq: Two times a day (BID) | ORAL | Status: DC

## 2014-11-11 NOTE — Patient Instructions (Signed)
Start memantine  at bedtime for 1 week, then increase to twice a day.
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
## 2014-11-17 DIAGNOSIS — Z23 Encounter for immunization: Secondary | ICD-10-CM | POA: Diagnosis not present

## 2014-11-17 DIAGNOSIS — R7301 Impaired fasting glucose: Secondary | ICD-10-CM | POA: Diagnosis not present

## 2014-11-17 DIAGNOSIS — E780 Pure hypercholesterolemia: Secondary | ICD-10-CM | POA: Diagnosis not present

## 2014-11-25 ENCOUNTER — Other Ambulatory Visit: Payer: Self-pay

## 2014-11-25 DIAGNOSIS — Z952 Presence of prosthetic heart valve: Secondary | ICD-10-CM

## 2014-11-25 DIAGNOSIS — I350 Nonrheumatic aortic (valve) stenosis: Secondary | ICD-10-CM

## 2014-12-01 ENCOUNTER — Other Ambulatory Visit: Payer: Self-pay | Admitting: Diagnostic Neuroimaging

## 2014-12-30 ENCOUNTER — Encounter (INDEPENDENT_AMBULATORY_CARE_PROVIDER_SITE_OTHER): Payer: Medicare Other | Admitting: Ophthalmology

## 2015-01-05 ENCOUNTER — Encounter (INDEPENDENT_AMBULATORY_CARE_PROVIDER_SITE_OTHER): Payer: Medicare Other | Admitting: Ophthalmology

## 2015-01-05 DIAGNOSIS — H43813 Vitreous degeneration, bilateral: Secondary | ICD-10-CM | POA: Diagnosis not present

## 2015-01-05 DIAGNOSIS — H35033 Hypertensive retinopathy, bilateral: Secondary | ICD-10-CM | POA: Diagnosis not present

## 2015-01-05 DIAGNOSIS — H3532 Exudative age-related macular degeneration: Secondary | ICD-10-CM

## 2015-01-05 DIAGNOSIS — I10 Essential (primary) hypertension: Secondary | ICD-10-CM | POA: Diagnosis not present

## 2015-01-05 DIAGNOSIS — H3531 Nonexudative age-related macular degeneration: Secondary | ICD-10-CM | POA: Diagnosis not present

## 2015-01-08 IMAGING — CR DG SHOULDER 2+V*L*
3 series · 3 of 3 positions shown · non-contrast
Comparison: None.

CLINICAL DATA: Left shoulder pain status post fall.

LEFT SHOULDER - 2+ VIEW

[w shoulder ap internal left]
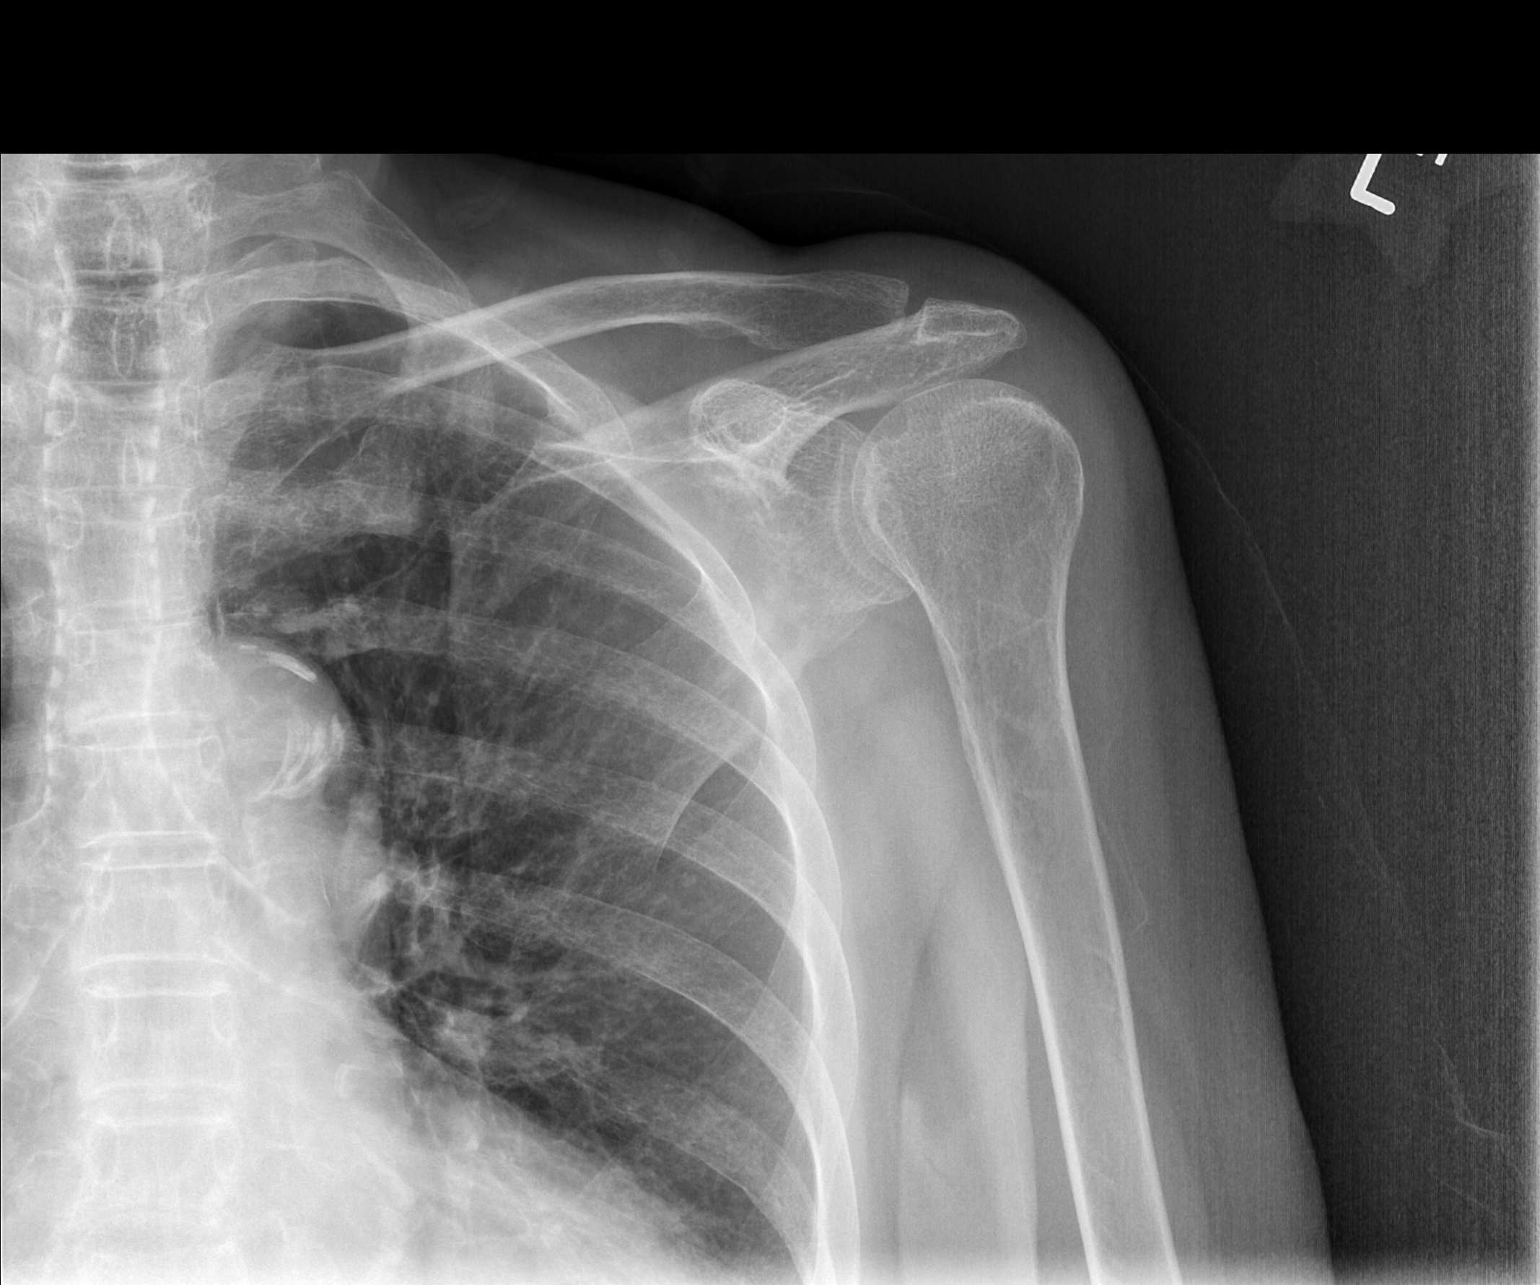

[w shoulder ap external left]
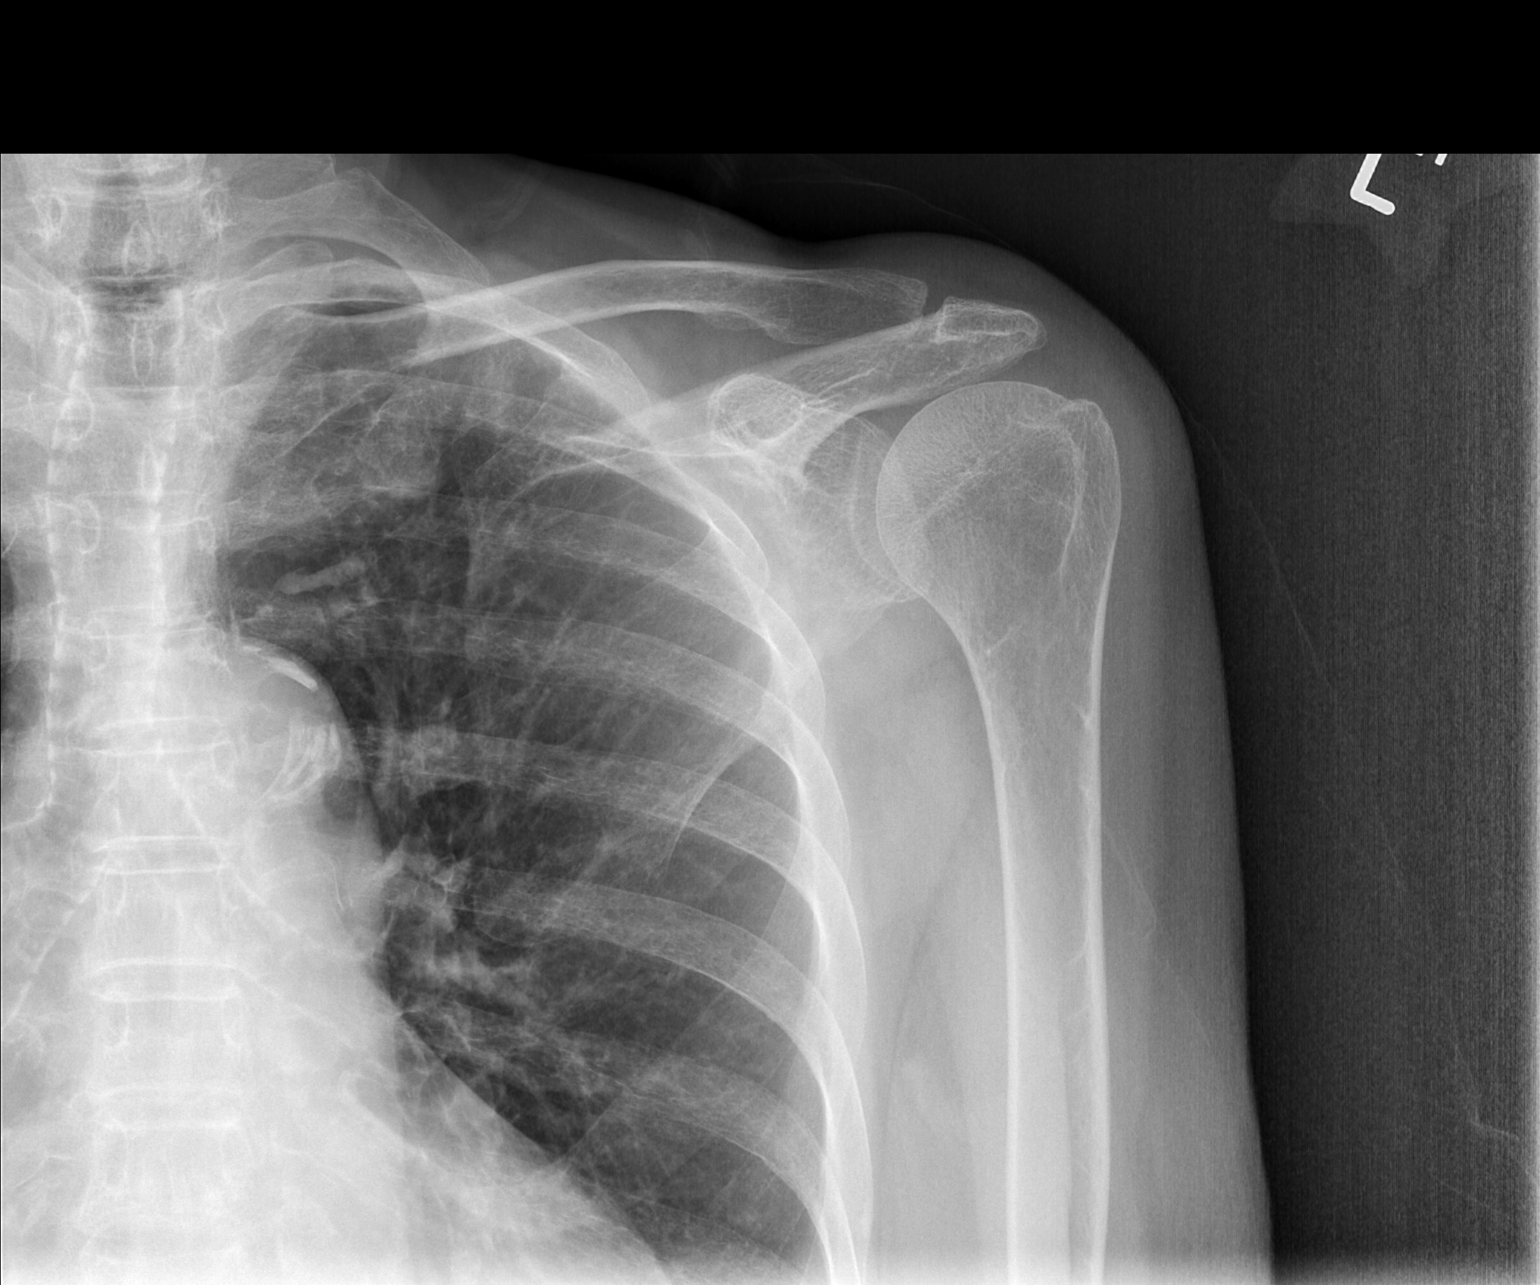

[w shoulder y view left]
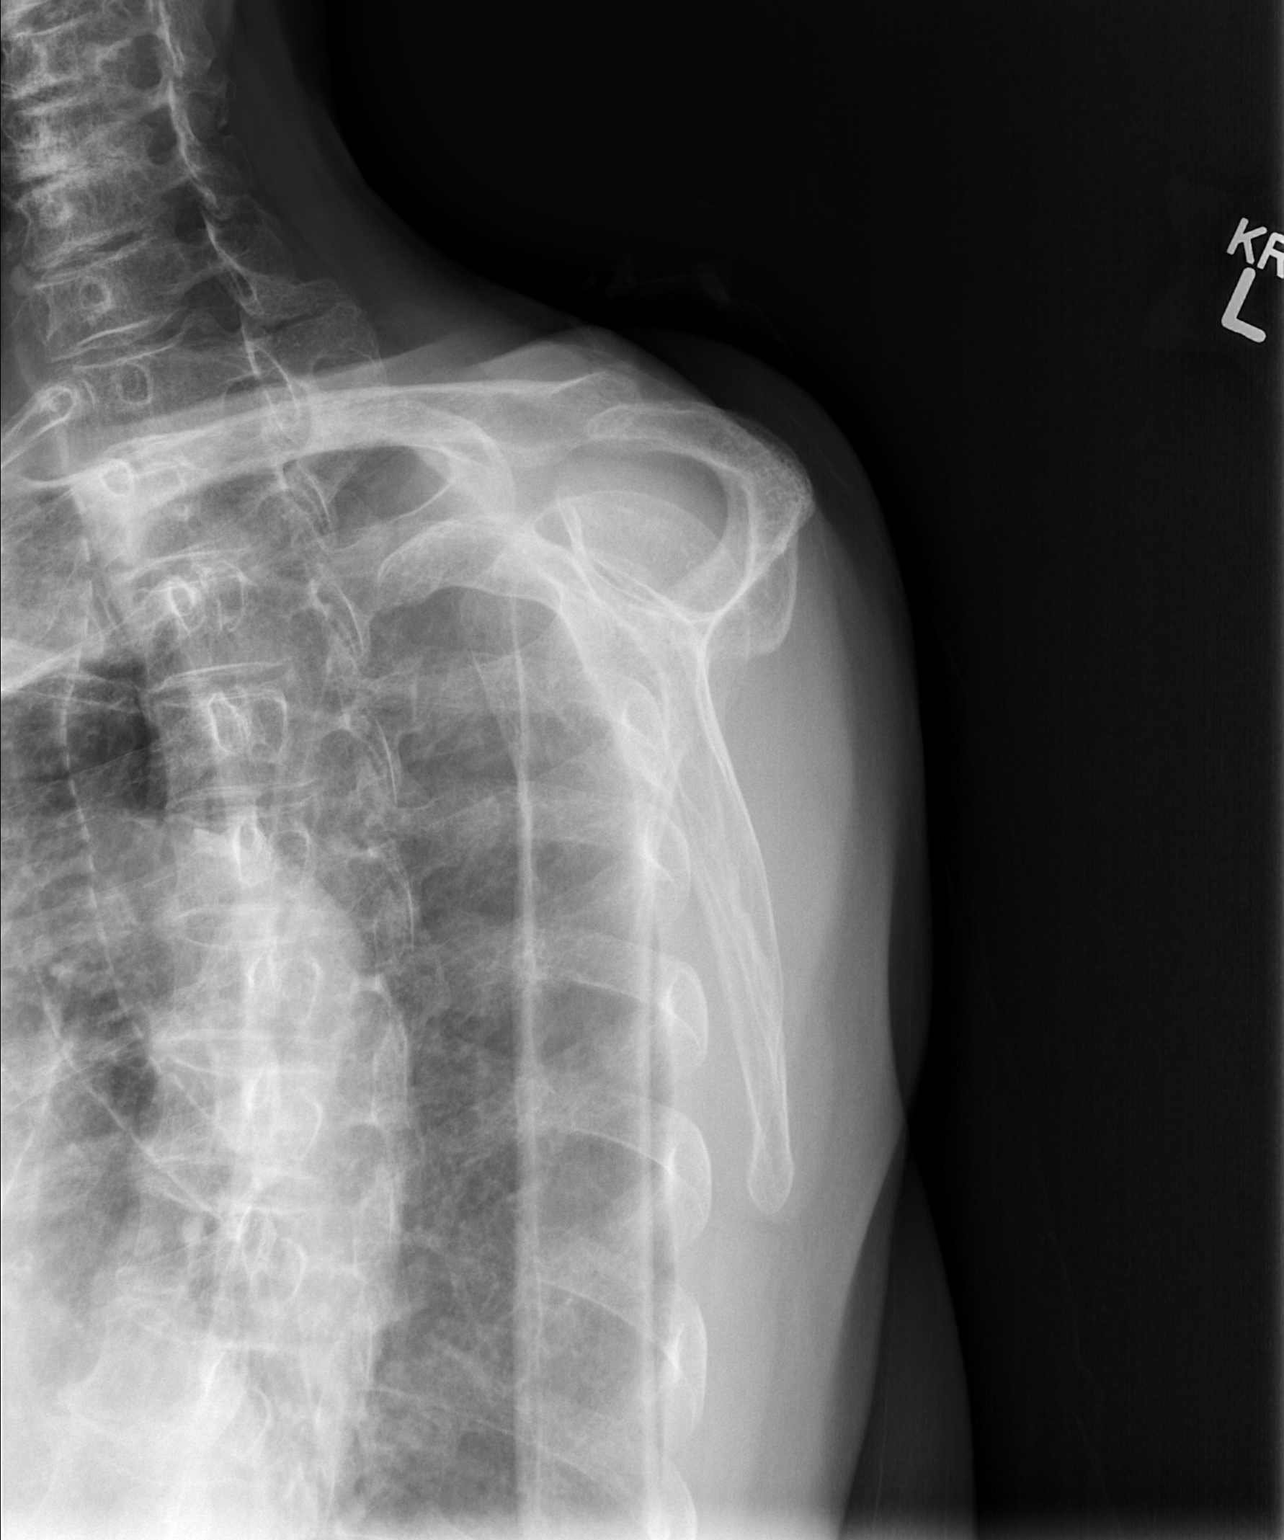

[3 of 3 positions shown; findings below may reference images not displayed]

FINDINGS: The bones appear mildly demineralized.  There is no
evidence of acute fracture or dislocation.  The subacromial space
is preserved.  Aortic atherosclerosis is noted.
IMPRESSION: No evidence of acute fracture or dislocation.

## 2015-01-08 IMAGING — CR DG LUMBAR SPINE COMPLETE 4+V
5 series · 5 of 5 positions shown · non-contrast
Comparison: Lumbar spine MRI 08/02/2008.

CLINICAL DATA: Low back pain status post fall.

LUMBAR SPINE - COMPLETE 4+ VIEW

[t l-spine a.p.]
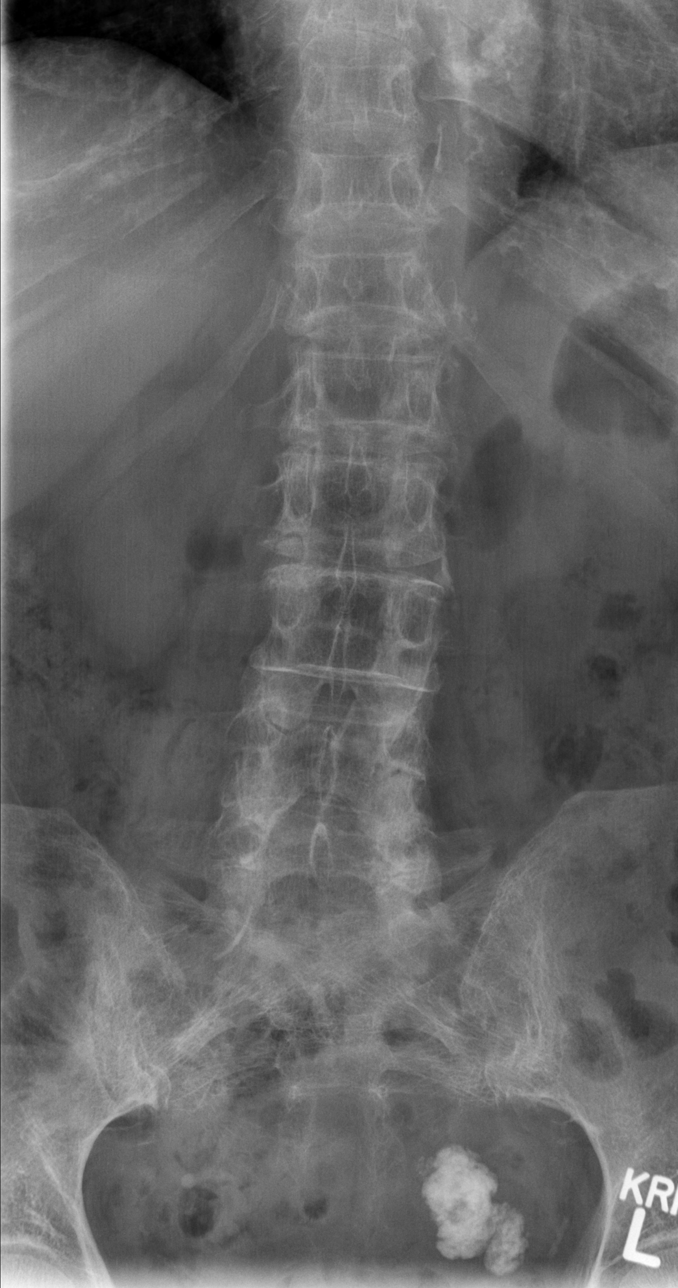

[t l-spine oblique exposure (1 of 2)]
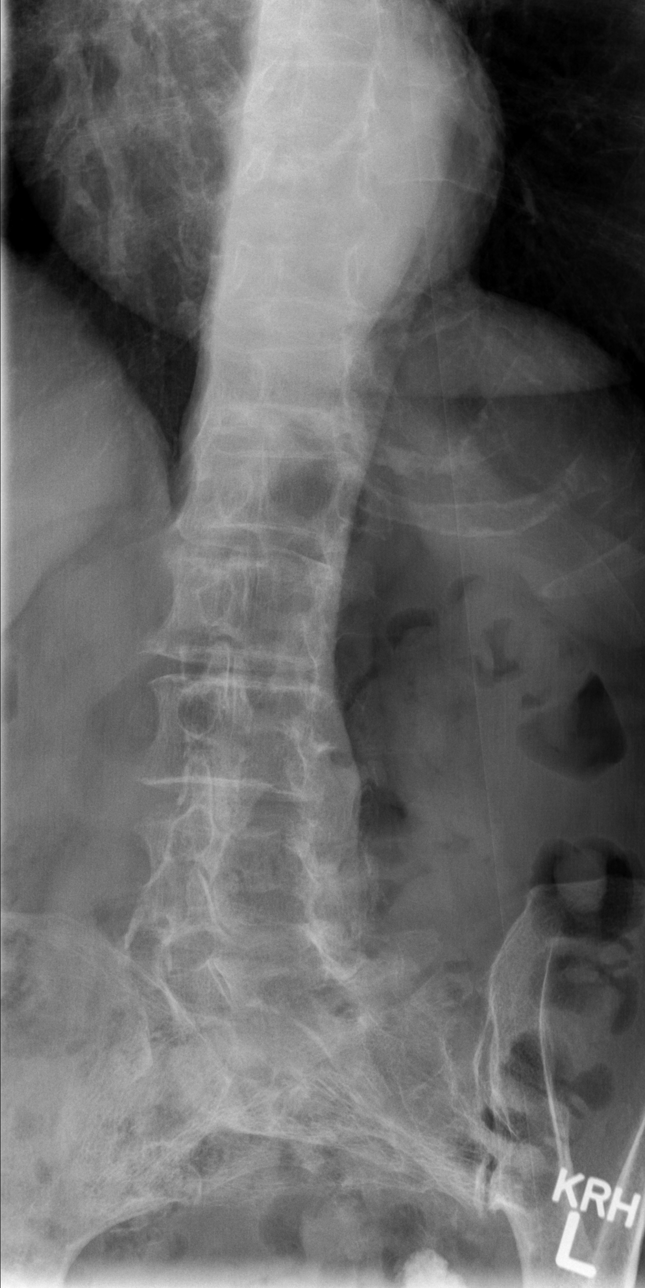

[t l-spine oblique exposure (2 of 2)]
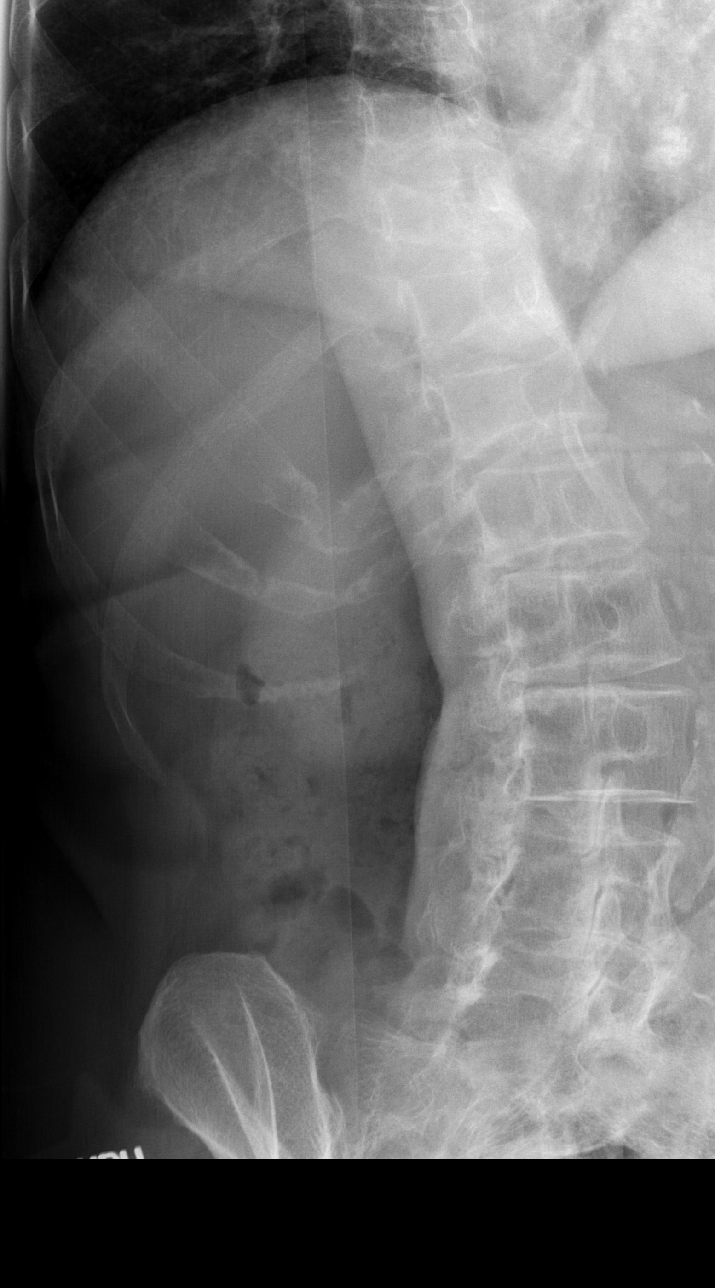

[t l-spine lat]
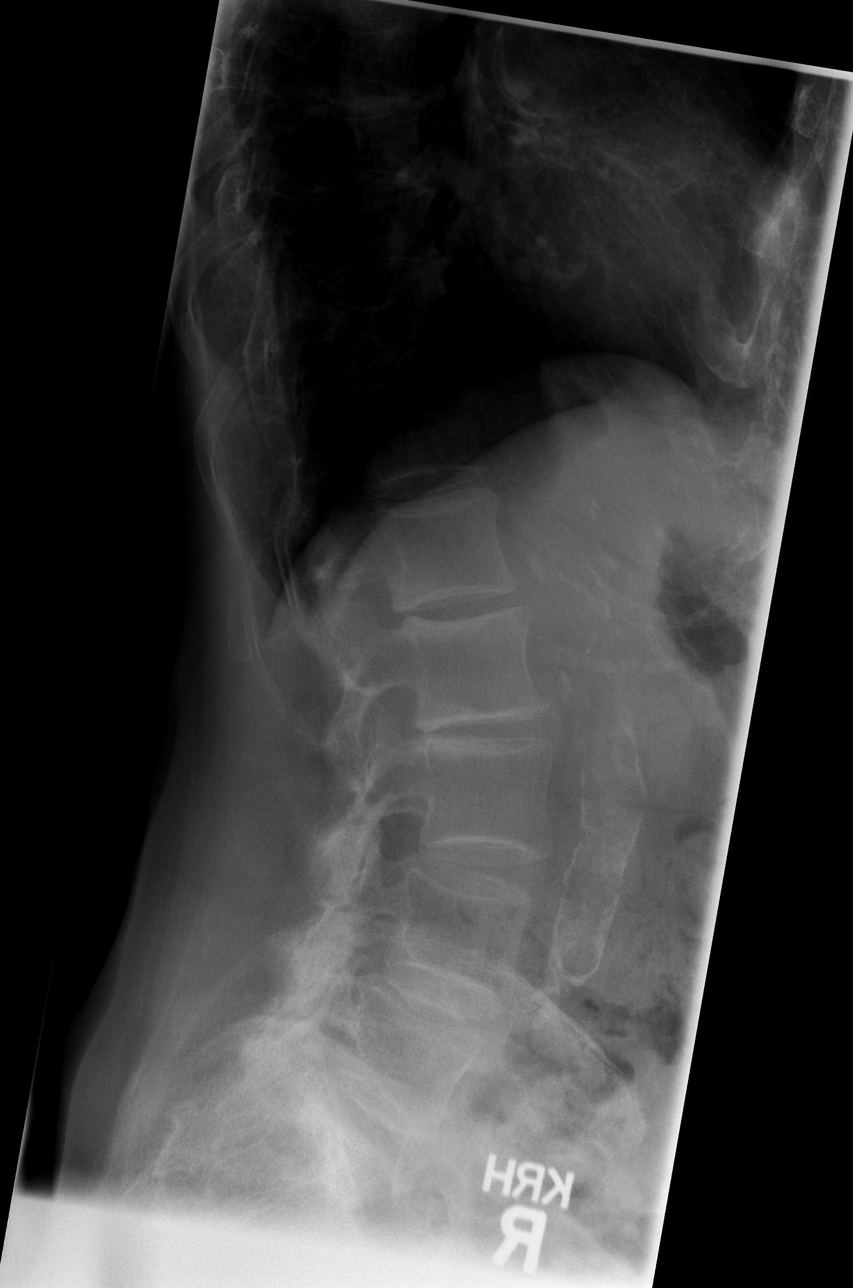

[t l-spine l5-s1 spot]
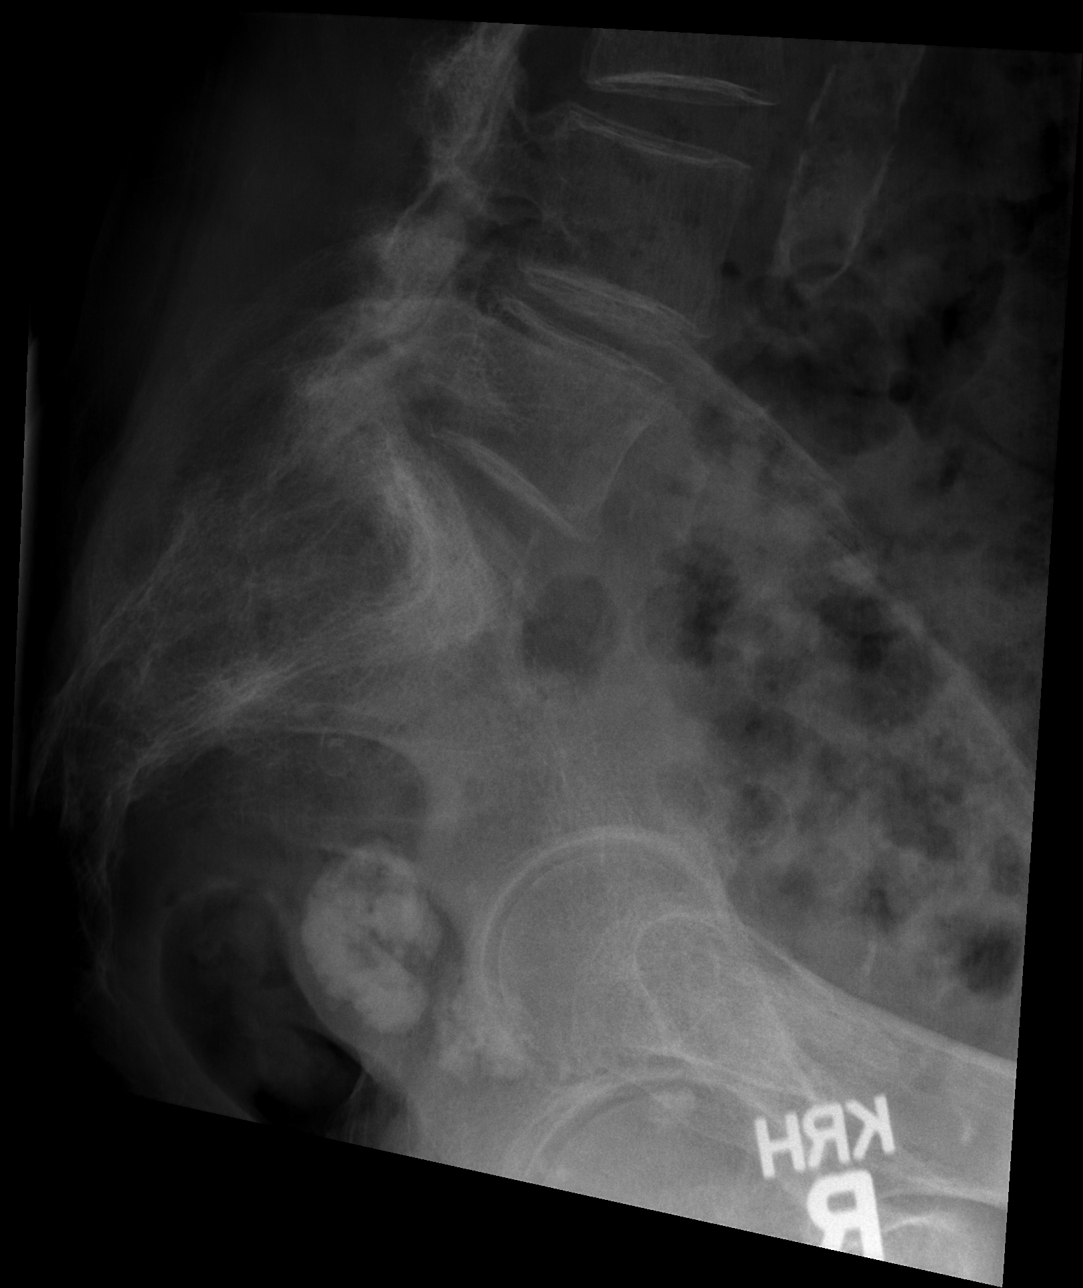

[5 of 5 positions shown; findings below may reference images not displayed]

FINDINGS: The bones appear mildly demineralized. Trabecular
thickening in the sacrum is unchanged.  There are five lumbar type
vertebral bodies.  The alignment is stable with a slight
anterolisthesis at L3-L4 and L4-L5.  There is dense of acute
fracture or pars defect.  Mild disc space loss and facet disease is
noted.  There is aorto iliac atherosclerosis without demonstrated
aneurysm.  Left pelvic calcifications are unchanged, corresponding
with a large dermoid on prior CT.
IMPRESSION: No acute osseous findings or significant malalignment.  Stable
osteopenia and spondylosis.

## 2015-02-07 ENCOUNTER — Telehealth: Payer: Self-pay | Admitting: Diagnostic Neuroimaging

## 2015-02-07 NOTE — Telephone Encounter (Signed)
Spoke with daughter who states she would like her mother to be seen. She states that since her father passed, she has noticed decline in her mother and wants her to be seen. Scheduled FU for 02/15/15. Daughter verbalized understanding, appreciation.

## 2015-02-07 NOTE — Telephone Encounter (Signed)
Pt's daughter called and states that pt's husband passed away last week, they feel that she has a little depression and they are wondering if she might need something. Family came in from out of town and seem to think her dementia has gotten worse. Pt's daughter is willing to set up appt if needed. Please call and advise [phone]

## 2015-02-10 DIAGNOSIS — Z23 Encounter for immunization: Secondary | ICD-10-CM | POA: Diagnosis not present

## 2015-02-10 DIAGNOSIS — R0981 Nasal congestion: Secondary | ICD-10-CM | POA: Diagnosis not present

## 2015-02-13 ENCOUNTER — Other Ambulatory Visit: Payer: Self-pay | Admitting: Cardiology

## 2015-02-15 ENCOUNTER — Encounter: Payer: Self-pay | Admitting: Diagnostic Neuroimaging

## 2015-02-15 ENCOUNTER — Ambulatory Visit (INDEPENDENT_AMBULATORY_CARE_PROVIDER_SITE_OTHER): Payer: Medicare Other | Admitting: Diagnostic Neuroimaging

## 2015-02-15 VITALS — BP 145/75 | HR 84 | Ht 63.0 in | Wt 124.8 lb

## 2015-02-15 DIAGNOSIS — F03A0 Unspecified dementia, mild, without behavioral disturbance, psychotic disturbance, mood disturbance, and anxiety: Secondary | ICD-10-CM

## 2015-02-15 DIAGNOSIS — F0390 Unspecified dementia without behavioral disturbance: Secondary | ICD-10-CM

## 2015-02-15 NOTE — Patient Instructions (Signed)
Thank you for coming to see [place] at [facility]. I hope we have been able to provide you high quality care today.  You may receive a patient satisfaction survey over the next few weeks. We would appreciate your feedback and comments so that we may continue to improve ourselves and the health of our patients.  - continue donepezil - consider tapering memantine off    ~~~~~~~~~~~~~~~~~~~~~~~~~~~~~~~~~~~~~~~~~~~~~~~~~~~~~~~~~~~~~~~~~  DR. Jaree Trinka'S GUIDE TO HAPPY AND HEALTHY LIVING These are some of my general health and wellness recommendations. Some of them may apply to you better than others. Please use common sense as you try these suggestions and feel free to ask me any questions.   ACTIVITY/FITNESS Mental, social, emotional and physical stimulation are very important for brain and body health. Try learning a new activity (arts, music, language, sports, games).  Keep moving your body to the best of your abilities.   NUTRITION Eat more plants: colorful vegetables, nuts, seeds and berries.  Eat less sugar, salt, preservatives and processed foods.  Avoid toxins such as cigarettes and alcohol.  Drink water when you are thirsty. Warm water with a slice of lemon is an excellent morning drink to start the day.  Consider these websites for more information The Nutrition Source (https://www.henry-hernandez.biz/) [employer] Nutrition (WindowBlog.ch)   RELAXATION Consider practicing mindfulness meditation or other relaxation techniques such as deep breathing, prayer, yoga, tai chi, massage. See website mindful.org or the apps Headspace or Calm to help get started.   SLEEP Try to get at least 7-8+ hours sleep per day. Regular exercise and reduced caffeine will help you sleep better. Practice good sleep hygeine techniques. See website sleep.org for more information.   PLANNING Prepare estate planning, living will, healthcare  POA documents. Sometimes this is best planned with the help of an attorney. Theconversationproject.org and agingwithdignity.org are excellent resources.
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
## 2015-03-10 DIAGNOSIS — H6123 Impacted cerumen, bilateral: Secondary | ICD-10-CM | POA: Diagnosis not present

## 2015-03-17 ENCOUNTER — Other Ambulatory Visit: Payer: Self-pay

## 2015-03-17 ENCOUNTER — Ambulatory Visit (HOSPITAL_COMMUNITY): Payer: Medicare Other | Attending: Cardiovascular Disease

## 2015-03-17 ENCOUNTER — Ambulatory Visit (INDEPENDENT_AMBULATORY_CARE_PROVIDER_SITE_OTHER): Payer: Medicare Other | Admitting: Cardiovascular Disease

## 2015-03-17 ENCOUNTER — Encounter: Payer: Self-pay | Admitting: Cardiovascular Disease

## 2015-03-17 VITALS — BP 100/80 | HR 79 | Ht 63.0 in | Wt 120.8 lb

## 2015-03-17 DIAGNOSIS — I517 Cardiomegaly: Secondary | ICD-10-CM | POA: Diagnosis not present

## 2015-03-17 DIAGNOSIS — I10 Essential (primary) hypertension: Secondary | ICD-10-CM | POA: Diagnosis not present

## 2015-03-17 DIAGNOSIS — I071 Rheumatic tricuspid insufficiency: Secondary | ICD-10-CM | POA: Diagnosis not present

## 2015-03-17 DIAGNOSIS — Z954 Presence of other heart-valve replacement: Secondary | ICD-10-CM

## 2015-03-17 DIAGNOSIS — I350 Nonrheumatic aortic (valve) stenosis: Secondary | ICD-10-CM | POA: Diagnosis not present

## 2015-03-17 DIAGNOSIS — Z952 Presence of prosthetic heart valve: Secondary | ICD-10-CM | POA: Diagnosis not present

## 2015-03-17 NOTE — Patient Instructions (Signed)
Medication Instructions:  Your physician recommends that you continue on your current medications as directed. Please refer to the Current Medication list given to you today.  Labwork: No new orders.   Testing/Procedures: No new orders.   Follow-Up: Your physician recommends that you continue routine cardiology follow-up with Dr SwazilandJordan.    Any Other Special Instructions Will Be Listed Below (If Applicable).     If you need a refill on your cardiac medications before your next appointment, please call your pharmacy.

## 2015-03-17 NOTE — Progress Notes (Signed)
Cardiology Office Note Date:  03/17/2015   ID:  Blossom Crume, DOB February 12, 1926, MRN [id number]  PCP:   [name], MD  Cardiologist:  Tonny Bollman, MD    Chief Complaint  Patient presents with  . Follow-up    TAVR follow-up     History of Present Illness: Devika Dragovich is a 81 y.o. female who presents for one year TAVR follow-up. She had severe symptomatic AS and was treated with a 23 mm Sapien XT valve via a transfemoral approach 02-15-2014. Her husband passed away from progressive kidney disease in September. They were married for 68 years. She is here with her daughter today.  The patient is doing well from a cardiac perspective. She denies chest pain, chest pressure, shortness of breath, edema, or heart palpitations. She is chronically anticoagulated with Eliquis and denies any bleeding problems.  Past Medical History  Diagnosis Date  . Hypertension   . High cholesterol   . UTI (lower urinary tract infection)   . Aortic stenosis   . Syncope 08/21/2012  . Paroxysmal atrial fibrillation (HCC)   . Age-related macular degeneration, wet, right eye (HCC)   . Heart murmur     "slight"  . Dementia     "slight"/daughter 12/02/2013  . S/P TAVR (transcatheter aortic valve replacement) 2014/02/15    23 mm Edwards Sapien XT transcatheter heart valve placed via open right transfemoral approach    Past Surgical History  Procedure Laterality Date  . Tonsillectomy    . Cardiac catheterization  04/2013  . Eye surgery      cataracts  . Transcatheter aortic valve replacement, transfemoral  02/15/14    Edwards Sapien XT THV (size 23 mm, model # 9300TFX, serial # [id number])  . Transcatheter aortic valve replacement, transfemoral N/A 02/15/14    Procedure: TRANSCATHETER AORTIC VALVE REPLACEMENT, TRANSFEMORAL;  Surgeon: Tonny Bollman, MD;  Location: [facility] OR;  Service: Open Heart Surgery;  Laterality: N/A;  . Intraoperative transesophageal echocardiogram N/A 2014-02-15    Procedure:  INTRAOPERATIVE TRANSESOPHAGEAL ECHOCARDIOGRAM;  Surgeon: Tonny Bollman, MD;  Location: [facility] OR;  Service: Open Heart Surgery;  Laterality: N/A;  . Left and right heart catheterization with coronary angiogram N/A 05/25/2013    Procedure: LEFT AND RIGHT HEART CATHETERIZATION WITH CORONARY ANGIOGRAM;  Surgeon: [name] [name], MD;  Location: [facility] CATH LAB;  Service: Cardiovascular;  Laterality: N/A;    Current Outpatient Prescriptions  Medication Sig Dispense Refill  . atorvastatin (LIPITOR) 20 MG tablet Take 20 mg by mouth daily.    [name] Besifloxacin HCl (BESIVANCE) 0.6 % SUSP Place 1 drop into the right eye 3 (three) times daily. Only use for 3 days after eye injections    . bisacodyl (BISACODYL) 5 MG EC tablet Take 1 tablet (5 mg total) by mouth daily as needed for mild constipation or moderate constipation.  0  . calcium citrate (CALCITRATE - DOSED IN MG ELEMENTAL CALCIUM) 950 MG tablet Take 1 tablet by mouth daily.    [name] donepezil (ARICEPT) 10 MG tablet TAKE 1 TABLET (10 MG TOTAL) BY MOUTH DAILY. 30 tablet 12  . ELIQUIS 2.5 MG TABS tablet TAKE 1 TABLET (2.5 MG TOTAL) BY MOUTH 2 (TWO) TIMES DAILY. 60 tablet 5  . Multiple Vitamin (MULTIVITAMIN) capsule Take 1 capsule by mouth daily.    [name] NIFEdipine (PROCARDIA XL/ADALAT-CC) 90 MG 24 hr tablet   1   No current facility-administered medications for this visit.    Allergies:   Review of patient's allergies indicates no known allergies.  Social History:  The patient  reports that she quit smoking about 54 years ago. Her smoking use included Cigarettes. She has a 12 pack-year smoking history. She has never used smokeless tobacco. She reports that she does not drink alcohol or use illicit drugs.   Family History:  The patient's  family history includes Cerebral aneurysm in her sister; Heart Problems in her father; Heart attack in her mother.    ROS:  Please see the history of present illness.  Otherwise, review of systems is positive for depression,  memory loss.  All other systems are reviewed and negative.    PHYSICAL EXAM: VS:  BP 100/80 mmHg  Pulse 79  Ht 5\' 3"  (1.6 m)  Wt 120 lb 12.8 oz (54.795 kg)  BMI 21.40 kg/m2  SpO2 98% , BMI Body mass index is 21.4 kg/(m^2). GEN: Well nourished, well developed, in no acute distress HEENT: normal Neck: no JVD, no masses.  Cardiac: RRR with 2/6 SEM at the RUSB            Respiratory:  clear to auscultation bilaterally, normal work of breathing GI: soft, nontender, nondistended, + BS MS: no deformity or atrophy Ext: no pretibial edema Skin: warm and dry, no rash Neuro:  Strength and sensation are intact Psych: euthymic mood, full affect  EKG:  EKG is ordered today. The ekg ordered today shows  Sinus rhythm 79 bpm, incomplete left bundle branch block.  Recent Labs: 06/03/2014: BUN 11; Hemoglobin 14.8; Platelets 189; Potassium 3.2*; Sodium 141 10/20/2014: Creatinine, Ser 0.88   Lipid Panel  No results found for: CHOL, TRIG, HDL, CHOLHDL, VLDL, LDLCALC, LDLDIRECT    Wt Readings from Last 3 Encounters:  03/17/15 120 lb 12.8 oz (54.795 kg)  02/15/15 124 lb 12.8 oz (56.609 kg)  11/11/14 121 lb 6.4 oz (55.067 kg)    Cardiac Studies Reviewed: 2D Echo report pending  ASSESSMENT AND PLAN: 1.  Aortic valve disorder s/p TAVR 2. Essential HTN 3. Paroxysmal atrial fibrillation 4. Chronic diastolic heart failure, NYHA 1  The patient's echo is personally reviewed.  There is no significant AI. Mean gradient is 18 mmHg. I did not make any medication changes today. The patient will continue to follow with Dr. SwazilandJordan. I think she is doing very well. She understands the indication for SBE prophylaxis.  Current medicines are reviewed with the patient today.  The patient does not have concerns regarding medicines.  Labs/ tests ordered today include:   Orders Placed This Encounter  Procedures  . EKG 12-Lead   Disposition:   FU as needed  Signed, Tonny Bollmanooper, Yassin Scales, MD  03/17/2015 3:58 PM      [facility] [address], [place], [place]  [id number] Phone: [phone]; Fax: [phone]

## 2015-03-22 ENCOUNTER — Telehealth: Payer: Self-pay

## 2015-03-22 NOTE — Telephone Encounter (Signed)
Patient called I received a message from Lauren Dr.Cooper's nurse about needing a follow up with Dr.Jordan.Patient stated she was doing good.Appointment scheduled with Dr.Jordan 06/14/15 at 9:15 am.Stated she will call back to reschedule if she cannot get a ride.

## 2015-03-29 DIAGNOSIS — R0981 Nasal congestion: Secondary | ICD-10-CM | POA: Diagnosis not present

## 2015-03-30 ENCOUNTER — Encounter (INDEPENDENT_AMBULATORY_CARE_PROVIDER_SITE_OTHER): Payer: Medicare Other | Admitting: Ophthalmology

## 2015-03-30 DIAGNOSIS — I10 Essential (primary) hypertension: Secondary | ICD-10-CM | POA: Diagnosis not present

## 2015-03-30 DIAGNOSIS — H43813 Vitreous degeneration, bilateral: Secondary | ICD-10-CM | POA: Diagnosis not present

## 2015-03-30 DIAGNOSIS — H35033 Hypertensive retinopathy, bilateral: Secondary | ICD-10-CM | POA: Diagnosis not present

## 2015-03-30 DIAGNOSIS — H353211 Exudative age-related macular degeneration, right eye, with active choroidal neovascularization: Secondary | ICD-10-CM

## 2015-03-30 DIAGNOSIS — H353124 Nonexudative age-related macular degeneration, left eye, advanced atrophic with subfoveal involvement: Secondary | ICD-10-CM

## 2015-04-11 DIAGNOSIS — R05 Cough: Secondary | ICD-10-CM | POA: Diagnosis not present

## 2015-04-11 DIAGNOSIS — J309 Allergic rhinitis, unspecified: Secondary | ICD-10-CM | POA: Diagnosis not present

## 2015-05-11 ENCOUNTER — Telehealth: Payer: Self-pay | Admitting: Diagnostic Neuroimaging

## 2015-05-11 NOTE — Telephone Encounter (Signed)
Pt's daughter called said her mother is showing signs of increased depression since husband passed. She just sits on the couch and looks out the window like she is expecting her husband to come home. Daughter said she has mild to moderate dementia and would like something that would not make her drowsy or dopey. Daughter works part time and is not at home with her all day.

## 2015-05-11 NOTE — Telephone Encounter (Signed)
Follow up with PCP first. -VRP

## 2015-05-11 NOTE — Telephone Encounter (Signed)
I called the patient's daughter and relayed Dr. Richrd HumblesPenumalli's message. She verbalized understanding.

## 2015-05-19 DIAGNOSIS — H353290 Exudative age-related macular degeneration, unspecified eye, stage unspecified: Secondary | ICD-10-CM | POA: Diagnosis not present

## 2015-06-14 ENCOUNTER — Ambulatory Visit: Payer: Medicare Other | Admitting: Cardiology

## 2015-06-23 DIAGNOSIS — F411 Generalized anxiety disorder: Secondary | ICD-10-CM | POA: Diagnosis not present

## 2015-06-23 DIAGNOSIS — R251 Tremor, unspecified: Secondary | ICD-10-CM | POA: Diagnosis not present

## 2015-06-30 DIAGNOSIS — F418 Other specified anxiety disorders: Secondary | ICD-10-CM | POA: Diagnosis not present

## 2015-06-30 DIAGNOSIS — R251 Tremor, unspecified: Secondary | ICD-10-CM | POA: Diagnosis not present

## 2015-06-30 DIAGNOSIS — R7301 Impaired fasting glucose: Secondary | ICD-10-CM | POA: Diagnosis not present

## 2015-07-03 ENCOUNTER — Emergency Department (HOSPITAL_COMMUNITY)
Admission: EM | Admit: 2015-07-03 | Discharge: 2015-07-05 | Payer: Medicare Other | Attending: Emergency Medicine | Admitting: Emergency Medicine

## 2015-07-03 ENCOUNTER — Encounter (HOSPITAL_COMMUNITY): Payer: Self-pay | Admitting: *Deleted

## 2015-07-03 DIAGNOSIS — R011 Cardiac murmur, unspecified: Secondary | ICD-10-CM | POA: Diagnosis not present

## 2015-07-03 DIAGNOSIS — E7800 Pure hypercholesterolemia, unspecified: Secondary | ICD-10-CM | POA: Insufficient documentation

## 2015-07-03 DIAGNOSIS — F321 Major depressive disorder, single episode, moderate: Secondary | ICD-10-CM | POA: Diagnosis not present

## 2015-07-03 DIAGNOSIS — Z9889 Other specified postprocedural states: Secondary | ICD-10-CM | POA: Diagnosis not present

## 2015-07-03 DIAGNOSIS — F0390 Unspecified dementia without behavioral disturbance: Secondary | ICD-10-CM | POA: Insufficient documentation

## 2015-07-03 DIAGNOSIS — R42 Dizziness and giddiness: Secondary | ICD-10-CM | POA: Diagnosis not present

## 2015-07-03 DIAGNOSIS — Z79899 Other long term (current) drug therapy: Secondary | ICD-10-CM | POA: Insufficient documentation

## 2015-07-03 DIAGNOSIS — Z8669 Personal history of other diseases of the nervous system and sense organs: Secondary | ICD-10-CM | POA: Insufficient documentation

## 2015-07-03 DIAGNOSIS — I10 Essential (primary) hypertension: Secondary | ICD-10-CM | POA: Insufficient documentation

## 2015-07-03 DIAGNOSIS — Z792 Long term (current) use of antibiotics: Secondary | ICD-10-CM | POA: Diagnosis not present

## 2015-07-03 DIAGNOSIS — F418 Other specified anxiety disorders: Secondary | ICD-10-CM | POA: Diagnosis not present

## 2015-07-03 DIAGNOSIS — F329 Major depressive disorder, single episode, unspecified: Secondary | ICD-10-CM

## 2015-07-03 DIAGNOSIS — Z87891 Personal history of nicotine dependence: Secondary | ICD-10-CM | POA: Insufficient documentation

## 2015-07-03 DIAGNOSIS — Z87440 Personal history of urinary (tract) infections: Secondary | ICD-10-CM | POA: Insufficient documentation

## 2015-07-03 DIAGNOSIS — R45851 Suicidal ideations: Secondary | ICD-10-CM | POA: Diagnosis not present

## 2015-07-03 DIAGNOSIS — F32A Depression, unspecified: Secondary | ICD-10-CM

## 2015-07-03 DIAGNOSIS — F419 Anxiety disorder, unspecified: Secondary | ICD-10-CM

## 2015-07-03 DIAGNOSIS — Z5111 Encounter for antineoplastic chemotherapy: Secondary | ICD-10-CM | POA: Diagnosis not present

## 2015-07-03 DIAGNOSIS — F410 Panic disorder [episodic paroxysmal anxiety] without agoraphobia: Secondary | ICD-10-CM | POA: Diagnosis not present

## 2015-07-03 HISTORY — DX: Depression, unspecified: F32.A

## 2015-07-03 HISTORY — DX: Major depressive disorder, single episode, unspecified: F32.9

## 2015-07-03 HISTORY — DX: Panic disorder (episodic paroxysmal anxiety): F41.0

## 2015-07-03 LAB — RAPID URINE DRUG SCREEN, HOSP PERFORMED
Amphetamines: NOT DETECTED
BENZODIAZEPINES: NOT DETECTED
Barbiturates: NOT DETECTED
COCAINE: NOT DETECTED
Opiates: NOT DETECTED
Tetrahydrocannabinol: NOT DETECTED

## 2015-07-03 LAB — URINALYSIS, ROUTINE W REFLEX MICROSCOPIC
BILIRUBIN URINE: NEGATIVE
Glucose, UA: NEGATIVE mg/dL
HGB URINE DIPSTICK: NEGATIVE
Ketones, ur: NEGATIVE mg/dL
NITRITE: NEGATIVE
Protein, ur: NEGATIVE mg/dL
Specific Gravity, Urine: 1.031 — ABNORMAL HIGH (ref 1.005–1.030)
pH: 5.5 (ref 5.0–8.0)

## 2015-07-03 LAB — BASIC METABOLIC PANEL
Anion gap: 12 (ref 5–15)
BUN: 25 mg/dL — AB (ref 6–20)
CALCIUM: 9.2 mg/dL (ref 8.9–10.3)
CO2: 26 mmol/L (ref 22–32)
Chloride: 102 mmol/L (ref 101–111)
Creatinine, Ser: 1.14 mg/dL — ABNORMAL HIGH (ref 0.44–1.00)
GFR calc Af Amer: 49 mL/min — ABNORMAL LOW (ref >60)
GFR, EST NON AFRICAN AMERICAN: 42 mL/min — AB (ref >60)
Glucose, Bld: 111 mg/dL — ABNORMAL HIGH (ref 65–99)
Potassium: 3.7 mmol/L (ref 3.5–5.1)
SODIUM: 140 mmol/L (ref 135–145)

## 2015-07-03 LAB — CBC
HEMATOCRIT: 44.8 % (ref 36.0–46.0)
Hemoglobin: 14.5 g/dL (ref 12.0–15.0)
MCH: 28.8 pg (ref 26.0–34.0)
MCHC: 32.4 g/dL (ref 30.0–36.0)
MCV: 88.9 fL (ref 78.0–100.0)
PLATELETS: 193 10*3/uL (ref 150–400)
RBC: 5.04 MIL/uL (ref 3.87–5.11)
RDW: 13.9 % (ref 11.5–15.5)
WBC: 8 10*3/uL (ref 4.0–10.5)

## 2015-07-03 LAB — CBG MONITORING, ED: Glucose-Capillary: 92 mg/dL (ref 65–99)

## 2015-07-03 LAB — URINE MICROSCOPIC-ADD ON: RBC / HPF: NONE SEEN RBC/hpf (ref 0–5)

## 2015-07-03 LAB — ETHANOL: Alcohol, Ethyl (B): <5 mg/dL (ref <5)

## 2015-07-03 MED ORDER — BISACODYL 5 MG PO TBEC
5.0000 mg | DELAYED_RELEASE_TABLET | Freq: Every day | ORAL | Status: DC | PRN
  Filled 2015-07-03: qty 1

## 2015-07-03 MED ORDER — ATORVASTATIN CALCIUM 20 MG PO TABS
20.0000 mg | ORAL_TABLET | Freq: Every day | ORAL | Status: DC
  Administered 2015-07-04 – 2015-07-05 (×2): 20 mg via ORAL
  Filled 2015-07-03 (×2): qty 1

## 2015-07-03 MED ORDER — LORAZEPAM 0.5 MG PO TABS
0.5000 mg | ORAL_TABLET | Freq: Three times a day (TID) | ORAL | Status: DC | PRN
  Administered 2015-07-03 – 2015-07-04 (×2): 0.5 mg via ORAL
  Filled 2015-07-03 (×2): qty 1

## 2015-07-03 MED ORDER — DONEPEZIL HCL 5 MG PO TABS
10.0000 mg | ORAL_TABLET | Freq: Every day | ORAL | Status: DC
  Administered 2015-07-03 – 2015-07-04 (×2): 10 mg via ORAL
  Filled 2015-07-03 (×2): qty 2

## 2015-07-03 MED ORDER — NIFEDIPINE ER OSMOTIC RELEASE 90 MG PO TB24
90.0000 mg | ORAL_TABLET | Freq: Every day | ORAL | Status: DC
  Administered 2015-07-04 – 2015-07-05 (×2): 90 mg via ORAL
  Filled 2015-07-03 (×2): qty 1

## 2015-07-03 MED ORDER — ACETAMINOPHEN 325 MG PO TABS
650.0000 mg | ORAL_TABLET | Freq: Once | ORAL | Status: AC
  Administered 2015-07-03: 650 mg via ORAL
  Filled 2015-07-03: qty 2

## 2015-07-03 MED ORDER — APIXABAN 2.5 MG PO TABS
2.5000 mg | ORAL_TABLET | Freq: Two times a day (BID) | ORAL | Status: DC
  Administered 2015-07-03 – 2015-07-05 (×4): 2.5 mg via ORAL
  Filled 2015-07-03 (×7): qty 1

## 2015-07-03 MED ORDER — CALCIUM CITRATE 950 (200 CA) MG PO TABS
1.0000 | ORAL_TABLET | Freq: Every day | ORAL | Status: DC
  Administered 2015-07-04 – 2015-07-05 (×2): 200 mg via ORAL
  Filled 2015-07-03 (×2): qty 1

## 2015-07-03 NOTE — BH Assessment (Addendum)
Assessment completed. Consulted with Assunta FoundShuvon rankin, NP who recommends patient be observed overnight and evaluated by psychiatry in the morning.  Informed EDP and family of disposition.   Davina PokeJoVea Iori Gigante, LCSW Therapeutic Triage Specialist [facility] 07/03/2015 2:45 PM

## 2015-07-03 NOTE — ED Notes (Signed)
Provided a Malaw[place]urkey sandwich and water.

## 2015-07-03 NOTE — ED Notes (Signed)
Bed: [id number] Expected date:  Expected time:  Means of arrival:  Comments: Psych Hold

## 2015-07-03 NOTE — ED Notes (Addendum)
Pt c/o weakness, shaky and dizziness since starting on Zoloft on Wed, Pt has po intake poor, pt repeats "I am so dizzy" "I feel very weak" Has seen doctor times 2 for these symptoms. Pt lost husband 5 mths ago, family feel like it is just now sinking in. Pt did tell daughter she wants to die.

## 2015-07-03 NOTE — ED Notes (Signed)
Provided meal tray and assisted patient setting it up to eat.

## 2015-07-03 NOTE — BH Assessment (Addendum)
Assessment Note  Rebecca [name] is an 82 y.o. female presenting to [facility] voluntarily for dizziness, depression, and "missing my husband." Patient states that she grew up beside her husband and married at age 28 and they were married for 68 years. Patient states that he was her best friend and a "wonderful husband and father, and a grandfather, he was the best." Patient states that it has been difficult for her since he has passed away.  Patient states "we were sitting talking, I didn't mean it, I just said I wanted to die because I miss my husband, he was such a wonderful husband, but I really didn't mean it." Patient denies current SI with no intent or plan. Patient denies previous attempts to harm herself or self injurious behaviors. Patient denies HI and no history of aggression. Patient denies AVH and does not appear to be responding to internal stimuli.   Patient denies use of drugs and alcohol. Patients UDS not collected and ETOH <5 at time of assessment.    Patients daughter states that the patient was diagnosed with dementia two or three years ago. She states that the patient has "tremors" in her hand that her PCP attribute to the dementia. Patients daughter states that the patient feels that she is shaking because she is anxious. Patients daughter states that the patient has been to her PCP twice in the past two weeks due to increased anxiety and was prescribed Zoloft last Thursday. Patients daughter states that the patient feels that she is a burden to the family since the patients husband passed away five months ago and she has been increasingly depressed. Patients daughter states that the patient husband had cancer and states that before he passed away he was very concerned about teh patient because he stated that he did "everything" for her. She states that the patient lives in an apartment below her house and she eats lunch and dinner with the patient every day. Patients daughter states that the  patient was concerned that the medication would make her "forget" about her husband and stated that the patient was impatient about how fast the medication was working. She states that the patient stated "I just give up" and the patients daughter got concerned. Patients daughter states that she does not feel that the patient would harm herself but she is concerned that she would say something like that. Patients daughter states "I just wanted her to come here and have a professional tell her that it would get better with time and that she needs to give the medicine time to work so that she could have that reassurance." Patients daughter states that the patient later told her son "I didn't mean it." but the patients daughter was concerned. Patients daughter states that the patient has an appointment scheduled with Dr. Donell Beers on July 12, 2015 who also treated her father for depression. She states that there is also a grief group at church that the patient went to last week and she hopes that she will continue to attend that group for support.    Patient was alert and oriented to person, place, location, time, and situation. Patient was laying in the bed with a hospital gown on. Patient made fair eye contact. Patient states that her appetite is "not what it used to be, but I'm hungry now." patient denies history of abuse and answered all questions appropriately. Patient was pleasant and appeared sad at times when discussing her late husband. Patients mood and affect congruent  to situation. Patient appeared to have protective factors such as living with family, family support, and stated that she was looking forward to spending time with her children and grand children. Patient states that she feels depressed "sometimes" due to missing her husband. Patient denied symptoms of depression but reports that she sometimes has low energy during the day but states that it is because "I just feel so lonely." Patient states  that she also "feels jittery at times, not all the time though" and states that she feels tired when she feels that way.  Patient denies history of abuse and access to weapons or firearms.    Consulted with Assunta FoundShuvon Rankin, NP who recommends patient be observed overnight and evaluated by psychiatry in the morning.   Diagnosis: 309.28 Adjustment disorder, With mixed anxiety and depressed mood  Past Medical History:  Past Medical History  Diagnosis Date  . Hypertension   . High cholesterol   . UTI (lower urinary tract infection)   . Aortic stenosis   . Syncope 08/21/2012  . Paroxysmal atrial fibrillation (HCC)   . Age-related macular degeneration, wet, right eye (HCC)   . Heart murmur     "slight"  . Dementia     "slight"/daughter 12/02/2013  . S/P TAVR (transcatheter aortic valve replacement) 02/02/2014    23 mm Edwards Sapien XT transcatheter heart valve placed via open right transfemoral approach  . Depression   . Panic attacks     Past Surgical History  Procedure Laterality Date  . Tonsillectomy    . Cardiac catheterization  04/2013  . Eye surgery      cataracts  . Transcatheter aortic valve replacement, transfemoral  02/02/2014    Edwards Sapien XT THV (size 23 mm, model # 9300TFX, serial # [id number])  . Transcatheter aortic valve replacement, transfemoral N/A 02/02/2014    Procedure: TRANSCATHETER AORTIC VALVE REPLACEMENT, TRANSFEMORAL;  Surgeon: Tonny BollmanMichael Cooper, MD;  [facility];  Service: Open Heart Surgery;  Laterality: N/A;  . Intraoperative transesophageal echocardiogram N/A 02/02/2014    Procedure: INTRAOPERATIVE TRANSESOPHAGEAL ECHOCARDIOGRAM;  Surgeon: Tonny BollmanMichael Cooper, MD;  [facility];  Service: Open Heart Surgery;  Laterality: N/A;  . Left and right heart catheterization with coronary angiogram N/A 05/25/2013    Procedure: LEFT AND RIGHT HEART CATHETERIZATION WITH CORONARY ANGIOGRAM;  Surgeon: [name] SwazilandJordan, MD;  [facility];  Service: Cardiovascular;   Laterality: N/A;    Family History:  Family History  Problem Relation Age of Onset  . Heart attack Mother   . Heart Problems Father   . Cerebral aneurysm Sister     Social History:  reports that she quit smoking about 55 years ago. Her smoking use included Cigarettes. She has a 12 pack-year smoking history. She has never used smokeless tobacco. She reports that she does not drink alcohol or use illicit drugs.  Additional Social History:  Alcohol / Drug Use Pain Medications: See PTA Prescriptions: See PTA Over the Counter: See PTA History of alcohol / drug use?: No history of alcohol / drug abuse  CIWA: CIWA-Ar BP: 137/68 mmHg Pulse Rate: 78 COWS:    Allergies: No Known Allergies  Home Medications:  (Not in a hospital admission)  OB/GYN Status:  No LMP recorded. Patient is postmenopausal.  General Assessment Data [facility]: [facility] Assessment: In system Is this a Tele or Face-to-Face Assessment?: Face-to-Face Is this an Initial Assessment or a Re-assessment for this encounter?: Initial Assessment Marital status: Widowed AzleMaiden name: Quinn PlowmanCiantro Is  patient pregnant?: No Pregnancy Status: No Living Arrangements: Other (Comment) (downstairs from her daughter) Can pt return to current living arrangement?: Yes Admission Status: Voluntary Is patient capable of signing voluntary admission?: Yes Insurance type: Medicare     Crisis Care Plan Living Arrangements: Other (Comment) (downstairs from her daughter) Name of Therapist: None  Education Status Is patient currently in school?: No Highest grade of school patient has completed: HS Diploma  Risk to self with the past 6 months Suicidal Ideation: No Has patient been a risk to self within the past 6 months prior to admission? : No Suicidal Intent: No Has patient had any suicidal intent within the past 6 months prior to admission? : No Is patient at risk for suicide?: No Suicidal Plan?: No Has patient had  any suicidal plan within the past 6 months prior to admission? : No Access to Means: No What has been your use of drugs/alcohol within the last 12 months?: Denies Previous Attempts/Gestures: No How many times?: 0 Other Self Harm Risks: Denies Triggers for Past Attempts: None known Intentional Self Injurious Behavior: None Family Suicide History: No Recent stressful life event(s): Loss (Comment) (husband passed away) Persecutory voices/beliefs?: No Depression: No ("sometimes") Substance abuse history and/or treatment for substance abuse?: No Suicide prevention information given to non-admitted patients: Not applicable  Risk to Others within the past 6 months Homicidal Ideation: No Does patient have any lifetime risk of violence toward others beyond the six months prior to admission? : No Thoughts of Harm to Others: No Current Homicidal Intent: No Current Homicidal Plan: No Access to Homicidal Means: No Identified Victim: Denies History of harm to others?: No Assessment of Violence: None Noted Violent Behavior Description: Denies Does patient have access to weapons?: No Criminal Charges Pending?: No Does patient have a court date: No Is patient on probation?: No  Psychosis Hallucinations: None noted Delusions: None noted  Mental Status Report Appearance/Hygiene: Unremarkable Eye Contact: Fair Motor Activity: Unable to assess Speech: Logical/coherent Level of Consciousness: Alert Mood: Pleasant Affect: Appropriate to circumstance Anxiety Level: None Thought Processes: Coherent, Relevant Judgement: Unimpaired Orientation: Person, Place, Time, Situation, Appropriate for developmental age Obsessive Compulsive Thoughts/Behaviors: None  Cognitive Functioning Concentration: Normal Memory: Recent Intact, Remote Intact IQ: Average Insight: Good Impulse Control: Fair Appetite: Fair Sleep: No Change Vegetative Symptoms: None  ADLScreening [name] Assessment  Services) Patient's cognitive ability adequate to safely complete daily activities?: Yes Patient able to express need for assistance with ADLs?: Yes Independently performs ADLs?: Yes (appropriate for developmental age)  Prior Inpatient Therapy Prior Inpatient Therapy: No Prior Therapy Dates: N/A Prior Therapy Facilty/Provider(s): N/A Reason for Treatment: N/A  Prior Outpatient Therapy Prior Outpatient Therapy: Yes Prior Therapy Dates:  ("I think I went once") Reason for Treatment: Depression Does patient have an ACCT team?: No Does patient have Intensive In-House Services?  : No Does patient have [facility] services? : No Does patient have P4CC services?: No  ADL Screening (condition at time of admission) Patient's cognitive ability adequate to safely complete daily activities?: Yes Is the patient deaf or have difficulty hearing?: No Does the patient have difficulty seeing, even when wearing glasses/contacts?: No Does the patient have difficulty concentrating, remembering, or making decisions?: No Patient able to express need for assistance with ADLs?: Yes Does the patient have difficulty dressing or bathing?: No Independently performs ADLs?: Yes (appropriate for developmental age) Does the patient have difficulty walking or climbing stairs?: No Weakness of Legs: None Weakness of Arms/Hands: None  Home Assistive Devices/Equipment Home  Assistive Devices/Equipment: None  Therapy Consults (therapy consults require a physician order) PT Evaluation Needed: No OT Evalulation Needed: No SLP Evaluation Needed: No Abuse/Neglect Assessment (Assessment to be complete while patient is alone) Physical Abuse: Denies Verbal Abuse: Denies Sexual Abuse: Denies Exploitation of patient/patient's resources: Denies Self-Neglect: Denies Values / Beliefs Cultural Requests During Hospitalization: None Spiritual Requests During Hospitalization: None Consults Spiritual Care Consult Needed:  No Social Work Consult Needed: No Merchant navy officer (For Healthcare) Does patient have an advance directive?: Yes Type of Advance Directive: Healthcare Power of Attorney, Living will Copy of advanced directive(s) in chart?: No - copy requested    Additional Information 1:1 In Past 12 Months?: No CIRT Risk: No Elopement Risk: No Does patient have medical clearance?: No     Disposition:  Disposition Initial Assessment Completed for this Encounter: Yes Disposition of Patient: Other dispositions (observe overnight and evalaute in the AM per Shuvon Rankin, ) Other disposition(s): Other (Comment)  On Site Evaluation by:   Reviewed with Physician:    Wai Litt 07/03/2015 4:10 PM

## 2015-07-03 NOTE — ED Notes (Signed)
Daughter's contact info if needed Shea Evans- [name] Klecka [phone]

## 2015-07-03 NOTE — ED Provider Notes (Signed)
CSN: [id number]     Arrival date & time 07/03/15  1122 History   First MD Initiated Contact with Patient 07/03/15 1211     Chief Complaint  Patient presents with  . Fatigue  . Dizziness  . Depression    HPI Pt's husband passed away 5 months ago.  Pt and her husband had been living with their daughter in the same house in their own apartment.   Pt continued to do well until just this last week.  Since that occurred she had multiple episodes of panic attacks.  She is feeling depressed.  She is less active, does not want to do anything.  Today she told family that she does not like being alone.  She told her family at one point that she wanted to die.  They schedule an appointment to see a psychiatrist next week but sx escalated this weekend. Past Medical History  Diagnosis Date  . Hypertension   . High cholesterol   . UTI (lower urinary tract infection)   . Aortic stenosis   . Syncope 08/21/2012  . Paroxysmal atrial fibrillation (HCC)   . Age-related macular degeneration, wet, right eye (HCC)   . Heart murmur     "slight"  . Dementia     "slight"/daughter 12/02/2013  . S/P TAVR (transcatheter aortic valve replacement) 02/02/2014    23 mm Edwards Sapien XT transcatheter heart valve placed via open right transfemoral approach  . Depression   . Panic attacks    Past Surgical History  Procedure Laterality Date  . Tonsillectomy    . Cardiac catheterization  04/2013  . Eye surgery      cataracts  . Transcatheter aortic valve replacement, transfemoral  02/02/2014    Edwards Sapien XT THV (size 23 mm, model # 9300TFX, serial # [id number])  . Transcatheter aortic valve replacement, transfemoral N/A 02/02/2014    Procedure: TRANSCATHETER AORTIC VALVE REPLACEMENT, TRANSFEMORAL;  Surgeon: Tonny BollmanMichael Cooper, MD;  [facility];  Service: Open Heart Surgery;  Laterality: N/A;  . Intraoperative transesophageal echocardiogram N/A 02/02/2014    Procedure: INTRAOPERATIVE TRANSESOPHAGEAL ECHOCARDIOGRAM;   Surgeon: Tonny BollmanMichael Cooper, MD;  [facility];  Service: Open Heart Surgery;  Laterality: N/A;  . Left and right heart catheterization with coronary angiogram N/A 05/25/2013    Procedure: LEFT AND RIGHT HEART CATHETERIZATION WITH CORONARY ANGIOGRAM;  Surgeon: [name] SwazilandJordan, MD;  [facility];  Service: Cardiovascular;  Laterality: N/A;   Family History  Problem Relation Age of Onset  . Heart attack Mother   . Heart Problems Father   . Cerebral aneurysm Sister    Social History  Substance Use Topics  . Smoking status: Former Smoker -- 0.40 packs/day for 30 years    Types: Cigarettes    Quit date: 04/30/1960  . Smokeless tobacco: Never Used  . Alcohol Use: No   OB History    No data available     Review of Systems  All other systems reviewed and are negative.     Allergies  Review of patient's allergies indicates no known allergies.  Home Medications   Prior to Admission medications   Medication Sig Start Date End Date Taking? Authorizing Provider  atorvastatin (LIPITOR) 20 MG tablet Take 20 mg by mouth daily.   Yes Historical Provider, MD  Besifloxacin HCl (BESIVANCE) 0.6 % SUSP Place 1 drop into the right eye 3 (three) times daily. Only use for 3 days after eye injections. Gets injection every 3 months.   Yes Historical  Provider, MD  calcium citrate (CALCITRATE - DOSED IN MG ELEMENTAL CALCIUM) 950 MG tablet Take 1 tablet by mouth daily.   Yes Historical Provider, MD  donepezil (ARICEPT) 10 MG tablet TAKE 1 TABLET (10 MG TOTAL) BY MOUTH DAILY. 12/01/14  Yes Vikram R Penumalli, MD  ELIQUIS 2.5 MG TABS tablet TAKE 1 TABLET (2.5 MG TOTAL) BY MOUTH 2 (TWO) TIMES DAILY. 02/14/15  Yes [name] [name], MD  loratadine (CLARITIN) 10 MG tablet Take 10 mg by mouth at bedtime.   Yes Historical Provider, MD  memantine (NAMENDA) 10 MG tablet Take 10 mg by mouth daily.   Yes Historical Provider, MD  Multiple Vitamin (MULTIVITAMIN) capsule Take 1 capsule by mouth daily.   Yes  Historical Provider, MD  NIFEdipine (PROCARDIA XL/ADALAT-CC) 90 MG 24 hr tablet Take 90 mg by mouth daily with breakfast.  12/06/13  Yes Historical Provider, MD  PRESCRIPTION MEDICATION Place into the right eye every 3 (three) months. Eye injection at eye dr's office   Yes Historical Provider, MD  sertraline (ZOLOFT) 25 MG tablet Take 25 mg by mouth at bedtime.  06/30/15  Yes Historical Provider, MD   BP 137/68 mmHg  Pulse 78  Temp(Src) 98.9 F (37.2 C) (Oral)  Resp 18  SpO2 99% Physical Exam  Constitutional: She appears well-developed and well-nourished. No distress.  HENT:  Head: Normocephalic and atraumatic.  Right Ear: External ear normal.  Left Ear: External ear normal.  Eyes: Conjunctivae are normal. Right eye exhibits no discharge. Left eye exhibits no discharge. No scleral icterus.  Neck: Neck supple. No tracheal deviation present.  Cardiovascular: Normal rate, regular rhythm and intact distal pulses.   Pulmonary/Chest: Effort normal and breath sounds normal. No stridor. No respiratory distress. She has no wheezes. She has no rales.  Abdominal: Soft. Bowel sounds are normal. She exhibits no distension. There is no tenderness. There is no rebound and no guarding.  Musculoskeletal: She exhibits no edema or tenderness.  Neurological: She is alert. She has normal strength. No cranial nerve deficit (no facial droop, extraocular movements intact, no slurred speech) or sensory deficit. She exhibits normal muscle tone. She displays no seizure activity. Coordination normal.  Skin: Skin is warm and dry. No rash noted.  Psychiatric: Her affect is not angry. She is withdrawn. She exhibits a depressed mood. She expresses no homicidal and no suicidal ideation. She expresses no suicidal plans and no homicidal plans.  Nursing note and vitals reviewed.   ED Course  Procedures (including critical care time) Labs Review Labs Reviewed  BASIC METABOLIC PANEL - Abnormal; Notable for the following:     Glucose, Bld 111 (*)    BUN 25 (*)    Creatinine, Ser 1.14 (*)    GFR calc non Af Amer 42 (*)    GFR calc Af Amer 49 (*)    All other components within normal limits  CBC  ETHANOL  URINALYSIS, ROUTINE W REFLEX MICROSCOPIC (NOT AT [facility])  URINE RAPID DRUG SCREEN, HOSP PERFORMED  CBG MONITORING, ED     EKG Interpretation   Date/Time:  Sunday July 03 2015 12:16:16 EST Ventricular Rate:  65 PR Interval:  153 QRS Duration: 130 QT Interval:  485 QTC Calculation: 504 R Axis:   -70 Text Interpretation:  Sinus rhythm Left bundle branch block Confirmed by  Reva Pinkley  MD-J, Loreli Debruler ([id number]) on 07/03/2015 12:27:50 PM      MDM   Final diagnoses:  Depression  Anxiety    Pt is medically stable.  She was assessed by the psychiatry team.  Plan is to observe overnight and have psychiatry assess in the am.    Linwood Dibbles, MD 07/03/15 1544

## 2015-07-03 NOTE — BH Assessment (Addendum)
Spoke with patients daughter, Corrie DandyMary, who states that patient feels that she is a "burden" now that her husband of 68 years has passed away. Patients daughter states that the patient has "tremors" and has been anxious lately. She states that the patient has been to her PCP twice in the past two weeks. Patients daughter states that she was prescribed Zoloft Thursday and states that it is making her dizzy and weak. Patients daughter states that the patient stated "I just give up" today because she states that she is "tired of being depressed" and "misses Daddy." Patients daughter states "that is one thing I know she would never do is hurt herself, but it scared me." Patients daughter states that the patient later told her son that she did not mean it and she misses her husband "so much." Patients daughter states that the patient has an appointment with Dr. Donell BeersPlovsky on July 12, 2015.  Davina PokeJoVea Darryel Diodato, LCSW Therapeutic Triage Specialist [facility] 07/03/2015 2:42 PM

## 2015-07-04 DIAGNOSIS — F321 Major depressive disorder, single episode, moderate: Secondary | ICD-10-CM

## 2015-07-04 DIAGNOSIS — R45851 Suicidal ideations: Secondary | ICD-10-CM

## 2015-07-04 MED ORDER — MIRTAZAPINE 7.5 MG PO TABS
7.5000 mg | ORAL_TABLET | Freq: Every day | ORAL | Status: DC
  Administered 2015-07-04: 7.5 mg via ORAL
  Filled 2015-07-04 (×3): qty 1

## 2015-07-04 MED ORDER — LORAZEPAM 0.5 MG PO TABS
0.5000 mg | ORAL_TABLET | Freq: Two times a day (BID) | ORAL | Status: DC | PRN
  Administered 2015-07-04 – 2015-07-05 (×2): 0.5 mg via ORAL
  Filled 2015-07-04 (×2): qty 1

## 2015-07-04 NOTE — ED Notes (Signed)
Psychiatric team in with the patient. Patient's daughter at the bedside.

## 2015-07-04 NOTE — Consult Note (Signed)
[facility] Face-to-Face Psychiatry Consult   Reason for Consult: Depressive Symptoms  Referring Physician: EDP Patient Identification: Rebecca [name] MRN:  [id number] Principal Diagnosis: MDD (major depressive disorder), single episode, moderate ([place]) Diagnosis:   Patient Active Problem List   Diagnosis Date Noted  . MDD (major depressive disorder), single episode, moderate ([place]) [F32.1] 07/04/2015  . S/P TAVR (transcatheter aortic valve replacement) [Z95.4] 02/02/2014  . Severe aortic stenosis [I35.0] 02/02/2014  . Sinus tachycardia ([place]) [R00.0] 12/04/2013  . Sinus bradycardia [R00.1] 12/04/2013  . Encounter for therapeutic drug monitoring [Z51.81] 06/17/2013  . Mild dementia [F03.90] 11/06/2012  . Paroxysmal atrial fibrillation (HCC) [I48.0]   . PAF (paroxysmal atrial fibrillation) ([place]) [I48.0] 10/03/2012  . Long term (current) use of anticoagulants [Z79.01] 10/03/2012  . Syncope [R55] 08/21/2012  . Aortic stenosis, severe [I35.0] 07/09/2012  . UTI (lower urinary tract infection) [N39.0] 07/08/2012  . BRONCHIECTASIS [J47.9] 12/09/2007  . SINUSITIS, CHRONIC [J32.9] 07/07/2007  . BRONCHITIS, CHRONIC [J42] 07/07/2007  . HYPERLIPIDEMIA [E78.5] 06/09/2007  . Essential hypertension [I10] 06/09/2007  . ALLERGIC RHINITIS [J30.9] 06/09/2007  . OSTEOPOROSIS [M81.0] 06/09/2007    Total Time spent with patient: 30 minutes  Subjective:   Rebecca [name] is a 82 y.o. female patient admitted to the [facility] for symptoms of depression that began after her husband of 52 years died several months ago. Patient reports poor appetite, depressed mood, poor energy, anxiety, and decreased enjoyment of life.   HPI:    Rebecca [name] is an 82 year old female who presented with her daughter to [facility] due to symptoms of depression that began several months ago following the death of her husband. The patient does not have a history of any psychiatric problems or suicide attempts. She reports making a suicidal comment to  her daughter but reports it was more related to grief reaction. The patient has been to her PCP twice in the last month due to worsening depression. After being started on Zoloft the patient reported feeling dizzy. Patient had an appointment with an outpatient Psychiatrist on July 12, 2015 but her symptoms worsened over the weekend. Her daughter expressed concern about the potential for patient to harm herself. Patient is very pleasant during assessments. She talks about the loss of her husband.   Past Psychiatric History: Denies  Risk to Self: Suicidal Ideation: No Suicidal Intent: No Is patient at risk for suicide?: No Suicidal Plan?: No Access to Means: No What has been your use of drugs/alcohol within the last 12 months?: Denies How many times?: 0 Other Self Harm Risks: Denies Triggers for Past Attempts: None known Intentional Self Injurious Behavior: None Risk to Others: Homicidal Ideation: No Thoughts of Harm to Others: No Current Homicidal Intent: No Current Homicidal Plan: No Access to Homicidal Means: No Identified Victim: Denies History of harm to others?: No Assessment of Violence: None Noted Violent Behavior Description: Denies Does patient have access to weapons?: No Criminal Charges Pending?: No Does patient have a court date: No Prior Inpatient Therapy: Prior Inpatient Therapy: No Prior Therapy Dates: N/A Prior Therapy Facilty/Provider(s): N/A Reason for Treatment: N/A Prior Outpatient Therapy: Prior Outpatient Therapy: Yes Prior Therapy Dates:  ("I think I went once") Reason for Treatment: Depression Does patient have an ACCT team?: No Does patient have Intensive In-House Services?  : No Does patient have [facility] services? : No Does patient have P4CC services?: No  Past Medical History:  Past Medical History  Diagnosis Date  . Hypertension   . High cholesterol   . UTI (  lower urinary tract infection)   . Aortic stenosis   . Syncope 08/21/2012  . Paroxysmal  atrial fibrillation (HCC)   . Age-related macular degeneration, wet, right eye ([place])   . Heart murmur     "slight"  . Dementia     "slight"/daughter 12/02/2013  . S/P TAVR (transcatheter aortic valve replacement) 02/02/2014    23 mm Edwards Sapien XT transcatheter heart valve placed via open right transfemoral approach  . Depression   . Panic attacks     Past Surgical History  Procedure Laterality Date  . Tonsillectomy    . Cardiac catheterization  04/2013  . Eye surgery      cataracts  . Transcatheter aortic valve replacement, transfemoral  02/02/2014    Edwards Sapien XT THV (size 23 mm, model # 9300TFX, serial # [id number])  . Transcatheter aortic valve replacement, transfemoral N/A 02/02/2014    Procedure: TRANSCATHETER AORTIC VALVE REPLACEMENT, TRANSFEMORAL;  Surgeon: Sherren Mocha, MD;  Location: [place];  Service: Open Heart Surgery;  Laterality: N/A;  . Intraoperative transesophageal echocardiogram N/A 02/02/2014    Procedure: INTRAOPERATIVE TRANSESOPHAGEAL ECHOCARDIOGRAM;  Surgeon: Sherren Mocha, MD;  Location: [facility] OR;  Service: Open Heart Surgery;  Laterality: N/A;  . Left and right heart catheterization with coronary angiogram N/A 05/25/2013    Procedure: LEFT AND RIGHT HEART CATHETERIZATION WITH CORONARY ANGIOGRAM;  Surgeon: [name] Martinique, MD;  Location: [facility] CATH LAB;  Service: Cardiovascular;  Laterality: N/A;   Family History:  Family History  Problem Relation Age of Onset  . Heart attack Mother   . Heart Problems Father   . Cerebral aneurysm Sister    Social History:  History  Alcohol Use No     History  Drug Use No    Social History   Social History  . Marital Status: Married    Spouse Name: [name]  . Number of Children: 3  . Years of Education: 11th   Occupational History  . Retired    Social History Main Topics  . Smoking status: Former Smoker -- 0.40 packs/day for 30 years    Types: Cigarettes    Quit date: 04/30/1960  . Smokeless tobacco: Never Used   . Alcohol Use: No  . Drug Use: No  . Sexual Activity: Not Asked   Other Topics Concern  . None   Social History Narrative   Pt lives at home with her daughter.   Married 62 yrs.   Caffeine Use: 2-3 cups daily   Additional Social History:    Allergies:  No Known Allergies  Labs:  Results for orders placed or performed during the hospital encounter of 07/03/15 (from the past 48 hour(s))  Basic metabolic panel     Status: Abnormal   Collection Time: 07/03/15 12:13 PM  Result Value Ref Range   Sodium 140 135 - 145 mmol/L   Potassium 3.7 3.5 - 5.1 mmol/L   Chloride 102 101 - 111 mmol/L   CO2 26 22 - 32 mmol/L   Glucose, Bld 111 (H) 65 - 99 mg/dL   BUN 25 (H) 6 - 20 mg/dL   Creatinine, Ser 1.14 (H) 0.44 - 1.00 mg/dL   Calcium 9.2 8.9 - 10.3 mg/dL   GFR calc non Af Amer 42 (L) >60 mL/min   GFR calc Af Amer 49 (L) >60 mL/min    Comment: (NOTE) The eGFR has been calculated using the CKD EPI equation. This calculation has not been validated in all clinical situations. eGFR's persistently <60 mL/min signify possible  Chronic Kidney Disease.    Anion gap 12 5 - 15  CBC     Status: None   Collection Time: 07/03/15 12:13 PM  Result Value Ref Range   WBC 8.0 4.0 - 10.5 K/uL   RBC 5.04 3.87 - 5.11 MIL/uL   Hemoglobin 14.5 12.0 - 15.0 g/dL   HCT 44.8 36.0 - 46.0 %   MCV 88.9 78.0 - 100.0 fL   MCH 28.8 26.0 - 34.0 pg   MCHC 32.4 30.0 - 36.0 g/dL   RDW 13.9 11.5 - 15.5 %   Platelets 193 150 - 400 K/uL  Ethanol     Status: None   Collection Time: 07/03/15 12:13 PM  Result Value Ref Range   Alcohol, Ethyl (B) <5 <5 mg/dL    Comment:        LOWEST DETECTABLE LIMIT FOR SERUM ALCOHOL IS 5 mg/dL FOR MEDICAL PURPOSES ONLY   CBG monitoring, ED     Status: None   Collection Time: 07/03/15 12:56 PM  Result Value Ref Range   Glucose-Capillary 92 65 - 99 mg/dL  Urinalysis, Routine w reflex microscopic (not at [facility])     Status: Abnormal   Collection Time: 07/03/15  4:14 PM  Result  Value Ref Range   Color, Urine AMBER (A) YELLOW    Comment: BIOCHEMICALS MAY BE AFFECTED BY COLOR   APPearance CLOUDY (A) CLEAR   Specific Gravity, Urine 1.031 (H) 1.005 - 1.030   pH 5.5 5.0 - 8.0   Glucose, UA NEGATIVE NEGATIVE mg/dL   Hgb urine dipstick NEGATIVE NEGATIVE   Bilirubin Urine NEGATIVE NEGATIVE   Ketones, ur NEGATIVE NEGATIVE mg/dL   Protein, ur NEGATIVE NEGATIVE mg/dL   Nitrite NEGATIVE NEGATIVE   Leukocytes, UA LARGE (A) NEGATIVE  Urine microscopic-add on     Status: Abnormal   Collection Time: 07/03/15  4:14 PM  Result Value Ref Range   Squamous Epithelial / LPF 0-5 (A) NONE SEEN   WBC, UA TOO NUMEROUS TO COUNT 0 - 5 WBC/hpf   RBC / HPF NONE SEEN 0 - 5 RBC/hpf   Bacteria, UA RARE (A) NONE SEEN   Casts HYALINE CASTS (A) NEGATIVE   Urine-Other MUCOUS PRESENT   Urine rapid drug screen (hosp performed)not at [facility]     Status: None   Collection Time: 07/03/15  4:15 PM  Result Value Ref Range   Opiates NONE DETECTED NONE DETECTED   Cocaine NONE DETECTED NONE DETECTED   Benzodiazepines NONE DETECTED NONE DETECTED   Amphetamines NONE DETECTED NONE DETECTED   Tetrahydrocannabinol NONE DETECTED NONE DETECTED   Barbiturates NONE DETECTED NONE DETECTED    Comment:        DRUG SCREEN FOR MEDICAL PURPOSES ONLY.  IF CONFIRMATION IS NEEDED FOR ANY PURPOSE, NOTIFY LAB WITHIN 5 DAYS.        LOWEST DETECTABLE LIMITS FOR URINE DRUG SCREEN Drug Class       Cutoff (ng/mL) Amphetamine      1000 Barbiturate      200 Benzodiazepine   196 Tricyclics       222 Opiates          300 Cocaine          300 THC              50     Current Facility-Administered Medications  Medication Dose Route Frequency Provider Last Rate Last Dose  . apixaban (ELIQUIS) tablet 2.5 mg  2.5 mg Oral BID Dorie Rank, MD  2.5 mg at 07/04/15 0942  . atorvastatin (LIPITOR) tablet 20 mg  20 mg Oral Daily Dorie Rank, MD   20 mg at 07/04/15 0942  . bisacodyl (DULCOLAX) EC tablet 5 mg  5 mg Oral Daily PRN  Dorie Rank, MD      . calcium citrate (CALCITRATE - dosed in mg elemental calcium) tablet 200 mg of elemental calcium  1 tablet Oral Daily Dorie Rank, MD   200 mg of elemental calcium at 07/04/15 0942  . donepezil (ARICEPT) tablet 10 mg  10 mg Oral QHS Dorie Rank, MD   10 mg at 07/03/15 2144  . LORazepam (ATIVAN) tablet 0.5 mg  0.5 mg Oral Q12H PRN Corena Pilgrim, MD      . mirtazapine (REMERON) tablet 7.5 mg  7.5 mg Oral QHS Betha Shadix, MD      . NIFEdipine (PROCARDIA XL/ADALAT-CC) 24 hr tablet 90 mg  90 mg Oral Daily Dorie Rank, MD   90 mg at 07/04/15 8299   Current Outpatient Prescriptions  Medication Sig Dispense Refill  . atorvastatin (LIPITOR) 20 MG tablet Take 20 mg by mouth daily.    [name] Besifloxacin HCl (BESIVANCE) 0.6 % SUSP Place 1 drop into the right eye 3 (three) times daily. Only use for 3 days after eye injections. Gets injection every 3 months.    . calcium citrate (CALCITRATE - DOSED IN MG ELEMENTAL CALCIUM) 950 MG tablet Take 1 tablet by mouth daily.    [name] donepezil (ARICEPT) 10 MG tablet TAKE 1 TABLET (10 MG TOTAL) BY MOUTH DAILY. 30 tablet 12  . ELIQUIS 2.5 MG TABS tablet TAKE 1 TABLET (2.5 MG TOTAL) BY MOUTH 2 (TWO) TIMES DAILY. 60 tablet 5  . loratadine (CLARITIN) 10 MG tablet Take 10 mg by mouth at bedtime.    . memantine (NAMENDA) 10 MG tablet Take 10 mg by mouth daily.    . Multiple Vitamin (MULTIVITAMIN) capsule Take 1 capsule by mouth daily.    [name] NIFEdipine (PROCARDIA XL/ADALAT-CC) 90 MG 24 hr tablet Take 90 mg by mouth daily with breakfast.   1  . PRESCRIPTION MEDICATION Place into the right eye every 3 (three) months. Eye injection at eye dr's office    . sertraline (ZOLOFT) 25 MG tablet Take 25 mg by mouth at bedtime.       Musculoskeletal: Strength & Muscle Tone: within normal limits Gait & Station: normal Patient leans: N/A  Psychiatric Specialty Exam: Review of Systems  Psychiatric/Behavioral: Positive for depression, suicidal ideas and memory loss  (History of dementia ). Negative for hallucinations and substance abuse. The patient is nervous/anxious and has insomnia.     Blood pressure 124/88, pulse 100, temperature 97.4 F (36.3 C), temperature source Oral, resp. rate 18, SpO2 99 %.There is no weight on file to calculate BMI.  General Appearance: Casual  Eye Contact::  Fair  Speech:  Clear and Coherent and Slow  Volume:  Decreased  Mood:  Dysphoric  Affect:  Constricted  Thought Process:  Coherent  Orientation:  Full (Time, Place, and Person)  Thought Content:  Rumination  Suicidal Thoughts:  Yes.  without intent/plan  Homicidal Thoughts:  No  Memory:  Immediate;   Good Recent;   Fair Remote;   Fair  Judgement:  Fair  Insight:  Present  Psychomotor Activity:  Decreased  Concentration:  Fair  Recall:  [employer] of Knowledge:Fair  Language: Good  Akathisia:  No  Handed:  Right  AIMS (if indicated):     Assets:  Communication Skills Desire for Improvement Financial Resources/Insurance Leisure Time Resilience Social Support  ADL's:  Intact  Cognition: Impaired,  Mild  Sleep:      Treatment Plan Summary: Daily contact with patient to assess and evaluate symptoms and progress in treatment and Medication management  -Discontinue Zoloft due to side effects. -Start Remeron 7.5 mg hs for depression -Continue Aricept 10 mg at bedtime for dementia.   Disposition: Recommend psychiatric Inpatient geropsych admission when medically cleared. Supportive therapy provided about ongoing stressors.  Elmarie Shiley, NP 07/04/2015 3:25 PM Patient seen face-to-face for psychiatric evaluation, chart reviewed and case discussed with the physician extender and developed treatment plan. Reviewed the information documented and agree with the treatment plan. Corena Pilgrim, MD

## 2015-07-04 NOTE — BHH Counselor (Signed)
This Clinical research associatewriter faxed out supporting documentation for pt placement [facility] [address] [facility] [place]  [place]

## 2015-07-04 NOTE — ED Notes (Signed)
Patient complains of feeling "shaky."  Patient informed she is not due for more medications for anxiety until @ 7:30 this evening.

## 2015-07-04 NOTE — ED Notes (Signed)
Patient's daughter left, states she will visit tomorrow.  Patient resting comfortably.

## 2015-07-04 NOTE — ED Notes (Signed)
Patient's belongings moved to locker 32.

## 2015-07-05 ENCOUNTER — Emergency Department (HOSPITAL_COMMUNITY): Payer: Medicare Other

## 2015-07-05 DIAGNOSIS — F410 Panic disorder [episodic paroxysmal anxiety] without agoraphobia: Secondary | ICD-10-CM | POA: Diagnosis not present

## 2015-07-05 DIAGNOSIS — F329 Major depressive disorder, single episode, unspecified: Secondary | ICD-10-CM | POA: Diagnosis not present

## 2015-07-05 DIAGNOSIS — F332 Major depressive disorder, recurrent severe without psychotic features: Secondary | ICD-10-CM | POA: Diagnosis present

## 2015-07-05 DIAGNOSIS — I480 Paroxysmal atrial fibrillation: Secondary | ICD-10-CM | POA: Diagnosis not present

## 2015-07-05 DIAGNOSIS — I4891 Unspecified atrial fibrillation: Secondary | ICD-10-CM | POA: Diagnosis present

## 2015-07-05 DIAGNOSIS — Z87891 Personal history of nicotine dependence: Secondary | ICD-10-CM | POA: Diagnosis not present

## 2015-07-05 DIAGNOSIS — Z7901 Long term (current) use of anticoagulants: Secondary | ICD-10-CM | POA: Diagnosis not present

## 2015-07-05 DIAGNOSIS — Z79899 Other long term (current) drug therapy: Secondary | ICD-10-CM | POA: Diagnosis not present

## 2015-07-05 DIAGNOSIS — E7800 Pure hypercholesterolemia, unspecified: Secondary | ICD-10-CM | POA: Diagnosis not present

## 2015-07-05 DIAGNOSIS — F0391 Unspecified dementia with behavioral disturbance: Secondary | ICD-10-CM | POA: Diagnosis present

## 2015-07-05 DIAGNOSIS — E785 Hyperlipidemia, unspecified: Secondary | ICD-10-CM | POA: Diagnosis not present

## 2015-07-05 DIAGNOSIS — R011 Cardiac murmur, unspecified: Secondary | ICD-10-CM | POA: Diagnosis not present

## 2015-07-05 DIAGNOSIS — N390 Urinary tract infection, site not specified: Secondary | ICD-10-CM | POA: Diagnosis present

## 2015-07-05 DIAGNOSIS — F0390 Unspecified dementia without behavioral disturbance: Secondary | ICD-10-CM | POA: Diagnosis not present

## 2015-07-05 DIAGNOSIS — F322 Major depressive disorder, single episode, severe without psychotic features: Secondary | ICD-10-CM | POA: Diagnosis not present

## 2015-07-05 DIAGNOSIS — E876 Hypokalemia: Secondary | ICD-10-CM | POA: Diagnosis present

## 2015-07-05 DIAGNOSIS — I129 Hypertensive chronic kidney disease with stage 1 through stage 4 chronic kidney disease, or unspecified chronic kidney disease: Secondary | ICD-10-CM | POA: Diagnosis present

## 2015-07-05 DIAGNOSIS — Z634 Disappearance and death of family member: Secondary | ICD-10-CM | POA: Diagnosis not present

## 2015-07-05 DIAGNOSIS — Z5111 Encounter for antineoplastic chemotherapy: Secondary | ICD-10-CM | POA: Diagnosis not present

## 2015-07-05 DIAGNOSIS — G309 Alzheimer's disease, unspecified: Secondary | ICD-10-CM | POA: Diagnosis not present

## 2015-07-05 DIAGNOSIS — F0280 Dementia in other diseases classified elsewhere without behavioral disturbance: Secondary | ICD-10-CM | POA: Diagnosis not present

## 2015-07-05 DIAGNOSIS — N183 Chronic kidney disease, stage 3 (moderate): Secondary | ICD-10-CM | POA: Diagnosis not present

## 2015-07-05 DIAGNOSIS — I10 Essential (primary) hypertension: Secondary | ICD-10-CM | POA: Diagnosis not present

## 2015-07-05 NOTE — ED Notes (Signed)
[facility] called requesting a EKG and chest xray. To be faxed over to provider for admission. Placed orders per Dr.Allen for chest xray and ekg per phone  request with charge nurse approval. Fax number - [phone]  To the attention of jennifer white.

## 2015-07-05 NOTE — ED Notes (Addendum)
Patient is sleeping soundly and in NAD. Breathing is easy and unlabored. Vitals will be delayed until 0600 as to not wake the patient.

## 2015-07-05 NOTE — Progress Notes (Addendum)
CSW spoke with patient to see if she was willing to go to [facility], [place], [place] as patient has been accepted at this facility. Patient stated she would be willing to go to [facility]. Psychiatrist and NP made aware.   CSW called to inform the daughter, Stanton Kidney that patient had been accepted at [facility], [place], [place]. She stated that she was informed by a female nurse that patient had been accepted at [facility]. CSW informed the daughter that no information had been received by the Psychiatry Team from an accepting doctor that patient had been accepted at [facility]. Daughter stated her main concern was the distance of the facility from here. She stated cannot decide where she wants to go because patient will go anywhere is she is asked at this point.   CSW staffed with NP and Psychiatry Team. CSW called back and informed the daughter that other required information could be sent to [facility] but there is no guarantee that patient will be accepted. 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 Daughter stated she would need to speak with her family and call back.   CSW was notified by TTS that patient had actually been accepted at [facility]. CSW called the daughter back to inform her of this information. CSW informed the daughter she would need to sign the consent for admission form and provide POA papers. She stated she would come after one o'clock.  CSW met with the daughter in patient's room and she signed the consent for admission and she provided POA papers. CSW faxed POA papers and consent for admission for to [facility], fax# [phone].    CSW called [facility] and spoke with Karna Christmas, [phone] to inform her that patient no longer needed the bed. She stated CSW would need to call [phone] to speak with a person at this number. CSW spoke with Elzie Rings, [place], [phone] to inform her that patient no longer needed the bed.     Genice Rouge [phone] ED CSW  07/05/2015 10:04 AM

## 2015-07-05 NOTE — ED Notes (Signed)
Called [facility] at [phone].

## 2015-07-05 NOTE — ED Notes (Signed)
[facility] The facility asked for an EKG and Chest X-Ray be faxed before admitting the patient to their unit.

## 2015-07-05 NOTE — ED Notes (Signed)
Report given to RN-P[facility]

## 2015-07-13 ENCOUNTER — Encounter (INDEPENDENT_AMBULATORY_CARE_PROVIDER_SITE_OTHER): Payer: Medicare Other | Admitting: Ophthalmology

## 2015-07-20 DIAGNOSIS — F321 Major depressive disorder, single episode, moderate: Secondary | ICD-10-CM | POA: Diagnosis not present

## 2015-08-05 ENCOUNTER — Encounter: Payer: Self-pay | Admitting: *Deleted

## 2015-08-09 ENCOUNTER — Encounter: Payer: Self-pay | Admitting: Cardiology

## 2015-08-09 ENCOUNTER — Ambulatory Visit (INDEPENDENT_AMBULATORY_CARE_PROVIDER_SITE_OTHER): Payer: Medicare Other | Admitting: Cardiology

## 2015-08-09 VITALS — BP 80/62 | HR 98 | Ht 63.0 in | Wt 123.2 lb

## 2015-08-09 DIAGNOSIS — Z7901 Long term (current) use of anticoagulants: Secondary | ICD-10-CM | POA: Diagnosis not present

## 2015-08-09 DIAGNOSIS — I480 Paroxysmal atrial fibrillation: Secondary | ICD-10-CM | POA: Diagnosis not present

## 2015-08-09 DIAGNOSIS — I350 Nonrheumatic aortic (valve) stenosis: Secondary | ICD-10-CM

## 2015-08-09 DIAGNOSIS — I10 Essential (primary) hypertension: Secondary | ICD-10-CM | POA: Diagnosis not present

## 2015-08-09 DIAGNOSIS — Z952 Presence of prosthetic heart valve: Secondary | ICD-10-CM

## 2015-08-09 DIAGNOSIS — Z954 Presence of other heart-valve replacement: Secondary | ICD-10-CM

## 2015-08-09 NOTE — Patient Instructions (Signed)
Stop taking nifedipine. Monitor BP at home and let me know if it is getting high   Continue your other medication

## 2015-08-11 ENCOUNTER — Telehealth: Payer: Self-pay | Admitting: Cardiology

## 2015-08-11 MED ORDER — AMLODIPINE BESYLATE 2.5 MG PO TABS: 2.5000 mg | ORAL_TABLET | Freq: Every day | ORAL | Status: DC

## 2015-08-11 NOTE — Telephone Encounter (Signed)
New message   Pt is calling to give Dr. Swazil[place]Jordan rn an update of her mothers BP readings   08/10/15 152-88 150/82  08/11/15 155/82 150/82

## 2015-08-11 NOTE — Telephone Encounter (Signed)
Returned call to patient's daughter Corrie DandyMary.Dr.Jordan advised to start amlodipine 2.5 mg daily.Advised continue to monitor B/P and call back in 1 week to report B/P readings.

## 2015-08-11 NOTE — Telephone Encounter (Signed)
Returned call to patient's daughter Corrie DandyMary she stated mother feels better not as tired.Stated she wanted to know if she needs to continue to hold nifedipine.Message sent to Dr.Jordan for advice.

## 2015-08-11 NOTE — Addendum Note (Signed)
Addended by: Neoma LamingPUGH, Monik Lins J on: 08/11/2015 05:57 PM   Modules accepted: Orders

## 2015-08-11 NOTE — Progress Notes (Signed)
Knox RoyaltyJennie Forney Date of Birth: Sep 02, 1925 Medical Record #[id number]  History of Present Illness: Mrs. Elon AlasCisek is seen for followup s/p TAVR. She has paroxysmal atrial fibrillation and severe aortic stenosis. She is now on Eliquis. She is s/p TAVR on 02/02/14 from a transfemoral approach. She did very well from this procedure. Follow up Echo in November 2016  Showed EF 50-55%. Mean AV gradient of 22 mm Hg. Mild mitral stenosis. She is seen with her daughter today. Since her husband passed away she has developed major depression and was hospitalized for one week last month. Now under the care of Dr. Donell BeersPlovsky. She feels jittery and nervous all the time. She is sleeping much of the day. Family is trying to get her to do more during the day. No chest pain or dyspnea. Denies any dizziness or syncope. She does complain of having no energy.  Current Outpatient Prescriptions on File Prior to Visit  Medication Sig Dispense Refill  . atorvastatin (LIPITOR) 20 MG tablet Take 20 mg by mouth daily.    [name]. Besifloxacin HCl (BESIVANCE) 0.6 % SUSP Place 1 drop into the right eye 3 (three) times daily. Only use for 3 days after eye injections. Gets injection every 3 months.    . calcium citrate (CALCITRATE - DOSED IN MG ELEMENTAL CALCIUM) 950 MG tablet Take 1 tablet by mouth daily.    [name]. donepezil (ARICEPT) 10 MG tablet TAKE 1 TABLET (10 MG TOTAL) BY MOUTH DAILY. 30 tablet 12  . ELIQUIS 2.5 MG TABS tablet TAKE 1 TABLET (2.5 MG TOTAL) BY MOUTH 2 (TWO) TIMES DAILY. 60 tablet 5  . loratadine (CLARITIN) 10 MG tablet Take 10 mg by mouth at bedtime.    . memantine (NAMENDA) 10 MG tablet Take 10 mg by mouth daily.    . Multiple Vitamin (MULTIVITAMIN) capsule Take 1 capsule by mouth daily.    [name]. PRESCRIPTION MEDICATION Place into the right eye every 3 (three) months. Eye injection at eye dr's office     No current facility-administered medications on file prior to visit.    No Known Allergies  Past Medical History   Diagnosis Date  . Hypertension   . High cholesterol   . UTI (lower urinary tract infection)   . Aortic stenosis   . Syncope 08/21/2012  . Paroxysmal atrial fibrillation (HCC)   . Age-related macular degeneration, wet, right eye (HCC)   . Heart murmur     "slight"  . Dementia     "slight"/daughter 12/02/2013  . S/P TAVR (transcatheter aortic valve replacement) 02/02/2014    23 mm Edwards Sapien XT transcatheter heart valve placed via open right transfemoral approach  . Depression   . Panic attacks     Past Surgical History  Procedure Laterality Date  . Tonsillectomy    . Cardiac catheterization  04/2013  . Eye surgery      cataracts  . Transcatheter aortic valve replacement, transfemoral  02/02/2014    Edwards Sapien XT THV (size 23 mm, model # 9300TFX, serial # [id number])  . Transcatheter aortic valve replacement, transfemoral N/A 02/02/2014    Procedure: TRANSCATHETER AORTIC VALVE REPLACEMENT, TRANSFEMORAL;  Surgeon: Tonny BollmanMichael Cooper, MD;  [facility];  Service: Open Heart Surgery;  Laterality: N/A;  . Intraoperative transesophageal echocardiogram N/A 02/02/2014    Procedure: INTRAOPERATIVE TRANSESOPHAGEAL ECHOCARDIOGRAM;  Surgeon: Tonny BollmanMichael Cooper, MD;  [facility];  Service: Open Heart Surgery;  Laterality: N/A;  . Left and right heart catheterization with coronary angiogram N/A 05/25/2013  Procedure: LEFT AND RIGHT HEART CATHETERIZATION WITH CORONARY ANGIOGRAM;  Surgeon: Gwen Sarvis M [name], MD;  Location: [facility] CATH LAB;  Service: Cardiovascular;  Laterality: N/A;    History  Smoking status  . Former Smoker -- 0.40 packs/day for 30 years  . Types: Cigarettes  . Quit date: 04/30/1960  Smokeless tobacco  . Never Used    History  Alcohol Use No    Family History  Problem Relation Age of Onset  . Heart attack Mother 21  . Heart Problems Father   . Cerebral aneurysm Sister 66  . Sudden death Brother 31  . Heart attack Father 19    Review of Systems: The review of  systems is positive for chronic memory loss. She is on Aricept.  All other systems were reviewed and are negative.  Physical Exam: BP 80/62 mmHg  Pulse 98  Ht  (1.6 m)  Wt 55.883 kg (123 lb 3.2 oz)  BMI 21.83 kg/m2 She is a pleasant elderly white female in no acute distress. I repeated BP readings in both arms with similar readings.  HEENT: Normal. There is good dental care. Neck is supple without JVD, adenopathy, or thyromegaly. Carotid upstrokes are normal. Lungs: Clear Cardiovascular: Regular rate and rhythm. Normal S1 with diminished S2. There is a soft grade 1-2/6 systolic murmur at the right upper sternal border. Abdomen: Soft and nontender. No masses or bruits. Bowel sounds positive. Extremities: Normal radial, femoral, and pedal pulses. No cyanosis or edema. Bruising in the right groin without hematoma or bruit. Skin: Dry Neuro: Alert and oriented x3. Cranial nerves II through XII are intact. Mood is depressed.  LABORATORY DATA:  Lab Results  Component Value Date   WBC 8.0 07/03/2015   HGB 14.5 07/03/2015   HCT 44.8 07/03/2015   PLT 193 07/03/2015   GLUCOSE 111* 07/03/2015   ALT 20 01/29/2014   AST 25 01/29/2014   NA 140 07/03/2015   K 3.7 07/03/2015   CL 102 07/03/2015   CREATININE 1.14* 07/03/2015   BUN 25* 07/03/2015   CO2 26 07/03/2015   TSH 1.400 12/02/2013   INR 1.2 10/19/2014   HGBA1C 6.0* 01/29/2014    Assessment / Plan: 1.  Aortic stenosis: Severe. Now s/p TAVR with good result.   2. Major depression. Per psychiatry.  3.  Paroxysmal atrial fibrillation. Now on Eliquis. No significant sustained high rates.  4. Hyperlipidemia.  5. Hypertension-controlled  6. Chronic diastolic CHF well compensated.   7. Low BP. Will stop amlodipine. Daughter is going to check BP at home at report if it remains low of goes too high.   I will follow up in 6 months.

## 2015-08-11 NOTE — Telephone Encounter (Signed)
Would start  on amlodipine 2.5 mg daily for now.  Seryna Marek SwazilandJordan MD, [facility]

## 2015-08-15 ENCOUNTER — Other Ambulatory Visit: Payer: Self-pay | Admitting: Cardiology

## 2015-08-16 ENCOUNTER — Encounter: Payer: Self-pay | Admitting: Diagnostic Neuroimaging

## 2015-08-16 ENCOUNTER — Ambulatory Visit (INDEPENDENT_AMBULATORY_CARE_PROVIDER_SITE_OTHER): Payer: Medicare Other | Admitting: Diagnostic Neuroimaging

## 2015-08-16 VITALS — BP 114/73 | HR 84 | Ht 63.0 in | Wt 122.4 lb

## 2015-08-16 DIAGNOSIS — F03B0 Unspecified dementia, moderate, without behavioral disturbance, psychotic disturbance, mood disturbance, and anxiety: Secondary | ICD-10-CM

## 2015-08-16 DIAGNOSIS — I350 Nonrheumatic aortic (valve) stenosis: Secondary | ICD-10-CM

## 2015-08-16 DIAGNOSIS — F0390 Unspecified dementia without behavioral disturbance: Secondary | ICD-10-CM

## 2015-08-16 MED ORDER — MEMANTINE HCL 10 MG PO TABS: 10.0000 mg | ORAL_TABLET | Freq: Two times a day (BID) | ORAL | Status: DC

## 2015-08-16 MED ORDER — DONEPEZIL HCL 10 MG PO TABS: 10.0000 mg | ORAL_TABLET | Freq: Every day | ORAL | Status: DC
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
## 2015-08-16 NOTE — Patient Instructions (Signed)
Thank you for coming to see us at [facility]. I hope we have been able to provide you high quality care today.  You may receive a patient satisfaction survey over the next few weeks. We would appreciate your feedback and comments so that we may continue to improve ourselves and the health of our patients.  - I will setup home palliative care consult - continue current medications  ~~~~~~~~~~~~~~~~~~~~~~~~~~~~~~~~~~~~~~~~~~~~~~~~~~~~~~~~~~~~~~~~~  PLANNING Prepare estate planning, living will, healthcare POA documents. Sometimes this is best planned with the help of an attorney. Theconversationproject.org and agingwithdignity.org are excellent resources.

## 2015-08-19 ENCOUNTER — Other Ambulatory Visit (HOSPITAL_COMMUNITY): Payer: Self-pay | Admitting: Psychiatry

## 2015-08-19 NOTE — Telephone Encounter (Signed)
Patient's daughter Rebecca [name] called Dr.Jordan received B/P readings, he advised readings look good, continue same medications.Follow up appointment scheduled with Dr.Jordan 11/07/15 at 2:30 pm.

## 2015-08-22 DIAGNOSIS — F321 Major depressive disorder, single episode, moderate: Secondary | ICD-10-CM | POA: Diagnosis not present

## 2015-09-13 DIAGNOSIS — F321 Major depressive disorder, single episode, moderate: Secondary | ICD-10-CM | POA: Diagnosis not present

## 2015-09-13 DIAGNOSIS — F419 Anxiety disorder, unspecified: Secondary | ICD-10-CM | POA: Diagnosis not present

## 2015-09-19 ENCOUNTER — Other Ambulatory Visit (HOSPITAL_COMMUNITY): Payer: Self-pay | Admitting: Psychiatry

## 2015-10-06 ENCOUNTER — Encounter (INDEPENDENT_AMBULATORY_CARE_PROVIDER_SITE_OTHER): Payer: Medicare Other | Admitting: Ophthalmology

## 2015-10-26 ENCOUNTER — Encounter (INDEPENDENT_AMBULATORY_CARE_PROVIDER_SITE_OTHER): Payer: Medicare Other | Admitting: Ophthalmology

## 2015-10-26 DIAGNOSIS — H353124 Nonexudative age-related macular degeneration, left eye, advanced atrophic with subfoveal involvement: Secondary | ICD-10-CM | POA: Diagnosis not present

## 2015-10-26 DIAGNOSIS — I10 Essential (primary) hypertension: Secondary | ICD-10-CM

## 2015-10-26 DIAGNOSIS — H35033 Hypertensive retinopathy, bilateral: Secondary | ICD-10-CM | POA: Diagnosis not present

## 2015-10-26 DIAGNOSIS — H43813 Vitreous degeneration, bilateral: Secondary | ICD-10-CM

## 2015-10-26 DIAGNOSIS — H353211 Exudative age-related macular degeneration, right eye, with active choroidal neovascularization: Secondary | ICD-10-CM

## 2015-10-29 DIAGNOSIS — N390 Urinary tract infection, site not specified: Secondary | ICD-10-CM | POA: Diagnosis not present

## 2015-11-07 ENCOUNTER — Ambulatory Visit: Payer: Self-pay | Admitting: Cardiology

## 2015-11-15 ENCOUNTER — Ambulatory Visit: Payer: Medicare Other | Admitting: Diagnostic Neuroimaging

## 2015-11-17 ENCOUNTER — Encounter (INDEPENDENT_AMBULATORY_CARE_PROVIDER_SITE_OTHER): Payer: Medicare Other | Admitting: Ophthalmology

## 2015-11-17 DIAGNOSIS — H353124 Nonexudative age-related macular degeneration, left eye, advanced atrophic with subfoveal involvement: Secondary | ICD-10-CM | POA: Diagnosis not present

## 2015-11-17 DIAGNOSIS — I10 Essential (primary) hypertension: Secondary | ICD-10-CM | POA: Diagnosis not present

## 2015-11-17 DIAGNOSIS — H353211 Exudative age-related macular degeneration, right eye, with active choroidal neovascularization: Secondary | ICD-10-CM

## 2015-11-17 DIAGNOSIS — H2512 Age-related nuclear cataract, left eye: Secondary | ICD-10-CM

## 2015-11-17 DIAGNOSIS — H43813 Vitreous degeneration, bilateral: Secondary | ICD-10-CM | POA: Diagnosis not present

## 2015-11-17 DIAGNOSIS — H35033 Hypertensive retinopathy, bilateral: Secondary | ICD-10-CM

## 2015-11-21 ENCOUNTER — Other Ambulatory Visit: Payer: Self-pay | Admitting: Diagnostic Neuroimaging

## 2015-11-23 ENCOUNTER — Telehealth: Payer: Self-pay | Admitting: *Deleted

## 2015-11-23 NOTE — Telephone Encounter (Signed)
LVM for Rebecca [name], patient's daughter and requested she call back to discuss letter received. Left name, number.   4:35 pm Rebecca [name], daughter and advised her that Dr [name] is out of the office , returns tomorrow. Discussed her mother's decline. Rebecca [name] stated there is a companion that comes twice a week "to do memory with her".  She stated they have talked to someone at [facility], but patient does not qualify.  Gave her name, number for [employer] and explained this is a free consultation to advise family, patient on best available services and /or facilities for patient based on concerns, needs and finances.  Informed [name] this RN will discuss with Dr [name] when he returns and call her with his reply. Did not schedule follow up at this time; advised she contact [employer] to gather information. She verbalized understanding, appreciation.

## 2015-11-23 NOTE — Telephone Encounter (Signed)
Patient;s daughter Shea Evans is calling and would like a call back after 4:00.  Thanks!

## 2015-11-24 NOTE — Telephone Encounter (Signed)
Pts daughter called in to make appt. As per note by nurse pt has 30 min. appt in the afternoon.

## 2015-11-24 NOTE — Telephone Encounter (Signed)
LVM for daughter, Corrie Dandy and informed her Dr [name] agrees with this RN's previous information, advice. Also advised that patient may be scheduled for follow up to discuss if [facility] felt that would be beneficial. Advised the phone staff may schedule follow up,and she should request a 30 minute follow up appointment. Left office number.

## 2015-11-24 NOTE — Telephone Encounter (Signed)
Agree with advice. If they would like to come in and discuss sooner then it is ok. -VRP

## 2015-11-28 DIAGNOSIS — F321 Major depressive disorder, single episode, moderate: Secondary | ICD-10-CM | POA: Diagnosis not present

## 2015-11-30 DIAGNOSIS — R748 Abnormal levels of other serum enzymes: Secondary | ICD-10-CM | POA: Diagnosis not present

## 2015-11-30 DIAGNOSIS — Z515 Encounter for palliative care: Secondary | ICD-10-CM | POA: Diagnosis not present

## 2015-11-30 DIAGNOSIS — I10 Essential (primary) hypertension: Secondary | ICD-10-CM | POA: Diagnosis not present

## 2015-11-30 DIAGNOSIS — Z0000 Encounter for general adult medical examination without abnormal findings: Secondary | ICD-10-CM | POA: Diagnosis not present

## 2015-11-30 DIAGNOSIS — E7800 Pure hypercholesterolemia, unspecified: Secondary | ICD-10-CM | POA: Diagnosis not present

## 2015-12-20 ENCOUNTER — Other Ambulatory Visit: Payer: Self-pay | Admitting: Cardiology

## 2015-12-20 MED ORDER — AMLODIPINE BESYLATE 2.5 MG PO TABS: 2.5000 mg | ORAL_TABLET | Freq: Every day | ORAL | 0 refills | Status: DC

## 2015-12-20 NOTE — Telephone Encounter (Signed)
Rx(s) sent to pharmacy electronically.  

## 2015-12-23 ENCOUNTER — Encounter (INDEPENDENT_AMBULATORY_CARE_PROVIDER_SITE_OTHER): Payer: Medicare Other | Admitting: Ophthalmology

## 2015-12-26 ENCOUNTER — Other Ambulatory Visit: Payer: Self-pay | Admitting: Cardiology

## 2015-12-26 MED ORDER — AMLODIPINE BESYLATE 2.5 MG PO TABS: 2.5000 mg | ORAL_TABLET | Freq: Every day | ORAL | 3 refills | Status: DC

## 2015-12-26 NOTE — Telephone Encounter (Signed)
Rx request sent to pharmacy.  

## 2016-01-05 ENCOUNTER — Encounter (INDEPENDENT_AMBULATORY_CARE_PROVIDER_SITE_OTHER): Payer: Medicare Other | Admitting: Ophthalmology

## 2016-01-05 DIAGNOSIS — H353211 Exudative age-related macular degeneration, right eye, with active choroidal neovascularization: Secondary | ICD-10-CM | POA: Diagnosis not present

## 2016-01-05 DIAGNOSIS — H353124 Nonexudative age-related macular degeneration, left eye, advanced atrophic with subfoveal involvement: Secondary | ICD-10-CM | POA: Diagnosis not present

## 2016-01-05 DIAGNOSIS — I10 Essential (primary) hypertension: Secondary | ICD-10-CM | POA: Diagnosis not present

## 2016-01-05 DIAGNOSIS — H43813 Vitreous degeneration, bilateral: Secondary | ICD-10-CM | POA: Diagnosis not present

## 2016-01-05 DIAGNOSIS — H2512 Age-related nuclear cataract, left eye: Secondary | ICD-10-CM

## 2016-01-05 DIAGNOSIS — H35033 Hypertensive retinopathy, bilateral: Secondary | ICD-10-CM

## 2016-01-09 ENCOUNTER — Ambulatory Visit (INDEPENDENT_AMBULATORY_CARE_PROVIDER_SITE_OTHER): Payer: Medicare Other | Admitting: Diagnostic Neuroimaging

## 2016-01-09 ENCOUNTER — Encounter: Payer: Self-pay | Admitting: Diagnostic Neuroimaging

## 2016-01-09 DIAGNOSIS — I350 Nonrheumatic aortic (valve) stenosis: Secondary | ICD-10-CM | POA: Diagnosis not present

## 2016-01-09 DIAGNOSIS — F0390 Unspecified dementia without behavioral disturbance: Secondary | ICD-10-CM

## 2016-01-09 DIAGNOSIS — F03B0 Unspecified dementia, moderate, without behavioral disturbance, psychotic disturbance, mood disturbance, and anxiety: Secondary | ICD-10-CM

## 2016-01-09 MED ORDER — MEMANTINE HCL 10 MG PO TABS: 10.0000 mg | ORAL_TABLET | Freq: Two times a day (BID) | ORAL | 4 refills | Status: DC

## 2016-01-09 MED ORDER — DONEPEZIL HCL 10 MG PO TABS: 10.0000 mg | ORAL_TABLET | Freq: Every day | ORAL | 4 refills | Status: DC

## 2016-01-09 NOTE — Progress Notes (Signed)
Daughter, Corrie DandyMary reported patient c/o urinary frequency but not always urinating. She saw PCP 2 mos ago, UA was clear. Advised daughter to call PCP and speak to RN. Advised that UTI could have developed since 2 mos ago. Advised she observe freq, odor, color. Advised her that cleanliness issues and the fact that patient drinks very little water can contribute, and advised UTI can cause confusion issues in elderly. She stated she would call PCP to discuss.
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
## 2016-01-09 NOTE — Patient Instructions (Signed)
-  continue current medications

## 2016-01-29 DIAGNOSIS — Z23 Encounter for immunization: Secondary | ICD-10-CM | POA: Diagnosis not present

## 2016-02-04 ENCOUNTER — Other Ambulatory Visit: Payer: Self-pay | Admitting: Cardiology

## 2016-02-12 ENCOUNTER — Other Ambulatory Visit (HOSPITAL_COMMUNITY): Payer: Self-pay | Admitting: Psychiatry

## 2016-02-16 ENCOUNTER — Encounter (INDEPENDENT_AMBULATORY_CARE_PROVIDER_SITE_OTHER): Payer: Medicare Other | Admitting: Ophthalmology

## 2016-02-16 DIAGNOSIS — I10 Essential (primary) hypertension: Secondary | ICD-10-CM | POA: Diagnosis not present

## 2016-02-16 DIAGNOSIS — H353231 Exudative age-related macular degeneration, bilateral, with active choroidal neovascularization: Secondary | ICD-10-CM

## 2016-02-16 DIAGNOSIS — H43813 Vitreous degeneration, bilateral: Secondary | ICD-10-CM

## 2016-02-16 DIAGNOSIS — H35033 Hypertensive retinopathy, bilateral: Secondary | ICD-10-CM | POA: Diagnosis not present

## 2016-02-23 ENCOUNTER — Encounter (INDEPENDENT_AMBULATORY_CARE_PROVIDER_SITE_OTHER): Payer: Medicare Other | Admitting: Ophthalmology

## 2016-03-14 DIAGNOSIS — H6123 Impacted cerumen, bilateral: Secondary | ICD-10-CM | POA: Diagnosis not present

## 2016-03-14 DIAGNOSIS — H9190 Unspecified hearing loss, unspecified ear: Secondary | ICD-10-CM | POA: Diagnosis not present

## 2016-03-17 NOTE — Progress Notes (Signed)
Knox RoyaltyJennie Roehrs Date of Birth: [birth year] Medical Record #[id number]  History of Present Illness: Mrs. Rebecca [name] is seen for followup s/p TAVR. She has paroxysmal atrial fibrillation and severe aortic stenosis. She is now on Eliquis. She is s/p TAVR on 02/02/14 from a transfemoral approach. She did very well from this procedure. Follow up Echo in November 2016  Showed EF 50-55%. Mean AV gradient of 22 mm Hg. Mild mitral stenosis. She is seen with her daughter today. Since her husband passed away she still has issues with major depression. Now under the care of Dr. Donell BeersPlovsky. She is more forgetful. She is going to Adult day care 5 days/wk and cannot be left alone.  No chest pain or dyspnea. Denies any dizziness or syncope. Energy level is better.  Current Outpatient Prescriptions on File Prior to Visit  Medication Sig Dispense Refill  . amLODipine (NORVASC) 2.5 MG tablet Take 1 tablet (2.5 mg total) by mouth daily. 90 tablet 3  . atorvastatin (LIPITOR) 20 MG tablet Take 20 mg by mouth daily.    [name]. Besifloxacin HCl (BESIVANCE) 0.6 % SUSP Place 1 drop into the right eye 3 (three) times daily. Only use for 3 days after eye injections. Gets injection every 3 months.    . calcium citrate (CALCITRATE - DOSED IN MG ELEMENTAL CALCIUM) 950 MG tablet Take 1 tablet by mouth daily.    [name]. donepezil (ARICEPT) 10 MG tablet Take 1 tablet (10 mg total) by mouth at bedtime. 90 tablet 4  . ELIQUIS 2.5 MG TABS tablet TAKE 1 TABLET (2.5 MG TOTAL) BY MOUTH 2 (TWO) TIMES DAILY. 60 tablet 5  . LORazepam (ATIVAN) 0.5 MG tablet Take 1 tablet by mouth 2 (two) times daily.    . memantine (NAMENDA) 10 MG tablet Take 1 tablet (10 mg total) by mouth 2 (two) times daily. 180 tablet 4  . mirtazapine (REMERON) 30 MG tablet Take 30 mg by mouth at bedtime.    . Multiple Vitamin (MULTIVITAMIN) capsule Take 1 capsule by mouth daily.    [name]. PRESCRIPTION MEDICATION Place into the right eye every 3 (three) months. Eye injection at eye dr's office     . sertraline (ZOLOFT) 100 MG tablet Take 1 tablet by mouth at bedtime.  5   No current facility-administered medications on file prior to visit.     No Known Allergies  Past Medical History:  Diagnosis Date  . Age-related macular degeneration, wet, right eye (HCC)   . Aortic stenosis   . Dementia    "slight"/daughter 12/02/2013  . Depression   . Heart murmur    "slight"  . High cholesterol   . Hypertension   . Panic attacks   . Paroxysmal atrial fibrillation (HCC)   . S/P TAVR (transcatheter aortic valve replacement) 02/02/2014   23 mm Edwards Sapien XT transcatheter heart valve placed via open right transfemoral approach  . Syncope 08/21/2012  . UTI (lower urinary tract infection)     Past Surgical History:  Procedure Laterality Date  . CARDIAC CATHETERIZATION  04/2013  . EYE SURGERY     cataracts  . INTRAOPERATIVE TRANSESOPHAGEAL ECHOCARDIOGRAM N/A 02/02/2014   Procedure: INTRAOPERATIVE TRANSESOPHAGEAL ECHOCARDIOGRAM;  Surgeon: Tonny BollmanMichael Cooper, MD;  [facility];  Service: Open Heart Surgery;  Laterality: N/A;  . LEFT AND RIGHT HEART CATHETERIZATION WITH CORONARY ANGIOGRAM N/A 05/25/2013   Procedure: LEFT AND RIGHT HEART CATHETERIZATION WITH CORONARY ANGIOGRAM;  Surgeon: [name] SwazilandJordan, MD;  [facility];  Service: Cardiovascular;  Laterality: N/A;  .  TONSILLECTOMY    . TRANSCATHETER AORTIC VALVE REPLACEMENT, TRANSFEMORAL  02/02/2014   Edwards Sapien XT THV (size 23 mm, model # 9300TFX, serial # [id number])  . TRANSCATHETER AORTIC VALVE REPLACEMENT, TRANSFEMORAL N/A 02/02/2014   Procedure: TRANSCATHETER AORTIC VALVE REPLACEMENT, TRANSFEMORAL;  Surgeon: Tonny BollmanMichael Cooper, MD;  [facility];  Service: Open Heart Surgery;  Laterality: N/A;    History  Smoking Status  . Former Smoker  . Packs/day: 0.40  . Years: 30.00  . Types: Cigarettes  . Quit date: 04/30/1960  Smokeless Tobacco  . Never Used    History  Alcohol Use No    Family History  Problem Relation Age  of Onset  . Heart attack Mother 7233  . Heart Problems Father   . Heart attack Father 7083  . Cerebral aneurysm Sister 4742  . Sudden death Brother 1465    Review of Systems: The review of systems is positive for chronic memory loss. She is on Aricept.  All other systems were reviewed and are negative.  Physical Exam: BP 136/84 (BP Location: Left Arm, Patient Position: Sitting, Cuff Size: Normal)   Pulse 72   Ht 5\' 2"  (1.575 m)   Wt 132 lb 9.6 oz (60.1 kg)   BMI 24.25 kg/m  She is a pleasant elderly white female in no acute distress. I repeated BP readings in both arms with similar readings.  HEENT: Normal. There is good dental care. Neck is supple without JVD, adenopathy, or thyromegaly. Carotid upstrokes are normal. Lungs: Clear Cardiovascular: Regular rate and rhythm. Normal S1 with diminished S2. There is a soft grade 1-2/6 systolic murmur at the right upper sternal border. Abdomen: Soft and nontender. No masses or bruits. Bowel sounds positive. Extremities: Normal radial, femoral, and pedal pulses. No cyanosis or edema. Bruising in the right groin without hematoma or bruit. Skin: Dry Neuro: Alert and oriented x3. Cranial nerves II through XII are intact. Mood is depressed.  LABORATORY DATA:  Lab Results  Component Value Date   WBC 8.0 07/03/2015   HGB 14.5 07/03/2015   HCT 44.8 07/03/2015   PLT 193 07/03/2015   GLUCOSE 111 (H) 07/03/2015   ALT 20 01/29/2014   AST 25 01/29/2014   NA 140 07/03/2015   K 3.7 07/03/2015   CL 102 07/03/2015   CREATININE 1.14 (H) 07/03/2015   BUN 25 (H) 07/03/2015   CO2 26 07/03/2015   TSH 1.400 12/02/2013   INR 1.2 10/19/2014   HGBA1C 6.0 (H) 01/29/2014   Labs dated 11/30/15: cholesterol 165, triglycerides 146, HDL 54, LDL 81. CMET normal.  Assessment / Plan: 1.  Aortic stenosis: Severe. Now s/p TAVR with good result.   2. Major depression. Per psychiatry.  3.  Paroxysmal atrial fibrillation. Now on Eliquis. No significant sustained high  rates.  4. Hyperlipidemia.  5. Hypertension-controlled  6. Chronic diastolic CHF well compensated.   Continue current therapy I will follow up in 6 months.

## 2016-03-19 ENCOUNTER — Ambulatory Visit (INDEPENDENT_AMBULATORY_CARE_PROVIDER_SITE_OTHER): Payer: Medicare Other | Admitting: Cardiology

## 2016-03-19 ENCOUNTER — Encounter: Payer: Self-pay | Admitting: Cardiology

## 2016-03-19 VITALS — BP 136/84 | HR 72 | Ht 62.0 in | Wt 132.6 lb

## 2016-03-19 DIAGNOSIS — I10 Essential (primary) hypertension: Secondary | ICD-10-CM

## 2016-03-19 DIAGNOSIS — Z952 Presence of prosthetic heart valve: Secondary | ICD-10-CM | POA: Diagnosis not present

## 2016-03-19 DIAGNOSIS — I350 Nonrheumatic aortic (valve) stenosis: Secondary | ICD-10-CM | POA: Diagnosis not present

## 2016-03-19 DIAGNOSIS — I480 Paroxysmal atrial fibrillation: Secondary | ICD-10-CM | POA: Diagnosis not present

## 2016-03-19 NOTE — Patient Instructions (Signed)
Continue your current therapy  I will see you in 6 months.   

## 2016-04-04 ENCOUNTER — Other Ambulatory Visit: Payer: Self-pay | Admitting: Cardiology

## 2016-04-05 ENCOUNTER — Encounter (INDEPENDENT_AMBULATORY_CARE_PROVIDER_SITE_OTHER): Payer: Medicare Other | Admitting: Ophthalmology

## 2016-04-05 DIAGNOSIS — I10 Essential (primary) hypertension: Secondary | ICD-10-CM

## 2016-04-05 DIAGNOSIS — H35033 Hypertensive retinopathy, bilateral: Secondary | ICD-10-CM

## 2016-04-05 DIAGNOSIS — H43813 Vitreous degeneration, bilateral: Secondary | ICD-10-CM | POA: Diagnosis not present

## 2016-04-05 DIAGNOSIS — H353231 Exudative age-related macular degeneration, bilateral, with active choroidal neovascularization: Secondary | ICD-10-CM

## 2016-04-08 ENCOUNTER — Other Ambulatory Visit: Payer: Self-pay | Admitting: Cardiology

## 2016-04-13 ENCOUNTER — Other Ambulatory Visit: Payer: Self-pay | Admitting: Cardiology

## 2016-04-15 ENCOUNTER — Encounter (HOSPITAL_COMMUNITY): Payer: Self-pay | Admitting: Certified Nurse Midwife

## 2016-04-15 ENCOUNTER — Inpatient Hospital Stay (HOSPITAL_COMMUNITY)
Admission: EM | Admit: 2016-04-15 | Discharge: 2016-04-18 | DRG: 312 | Disposition: A | Discharge status: Home Health | Payer: Medicare Other | Attending: Internal Medicine | Admitting: Internal Medicine

## 2016-04-15 ENCOUNTER — Emergency Department (HOSPITAL_COMMUNITY): Payer: Medicare Other

## 2016-04-15 DIAGNOSIS — Z87891 Personal history of nicotine dependence: Secondary | ICD-10-CM

## 2016-04-15 DIAGNOSIS — R400 Somnolence: Secondary | ICD-10-CM

## 2016-04-15 DIAGNOSIS — M810 Age-related osteoporosis without current pathological fracture: Secondary | ICD-10-CM | POA: Diagnosis present

## 2016-04-15 DIAGNOSIS — I441 Atrioventricular block, second degree: Secondary | ICD-10-CM | POA: Diagnosis not present

## 2016-04-15 DIAGNOSIS — G4720 Circadian rhythm sleep disorder, unspecified type: Secondary | ICD-10-CM

## 2016-04-15 DIAGNOSIS — G479 Sleep disorder, unspecified: Secondary | ICD-10-CM | POA: Diagnosis present

## 2016-04-15 DIAGNOSIS — E860 Dehydration: Secondary | ICD-10-CM | POA: Diagnosis present

## 2016-04-15 DIAGNOSIS — G3183 Dementia with Lewy bodies: Secondary | ICD-10-CM | POA: Diagnosis present

## 2016-04-15 DIAGNOSIS — I110 Hypertensive heart disease with heart failure: Secondary | ICD-10-CM | POA: Diagnosis present

## 2016-04-15 DIAGNOSIS — R55 Syncope and collapse: Secondary | ICD-10-CM | POA: Diagnosis not present

## 2016-04-15 DIAGNOSIS — I5032 Chronic diastolic (congestive) heart failure: Secondary | ICD-10-CM | POA: Diagnosis not present

## 2016-04-15 DIAGNOSIS — F410 Panic disorder [episodic paroxysmal anxiety] without agoraphobia: Secondary | ICD-10-CM | POA: Diagnosis present

## 2016-04-15 DIAGNOSIS — R42 Dizziness and giddiness: Secondary | ICD-10-CM | POA: Diagnosis not present

## 2016-04-15 DIAGNOSIS — Z818 Family history of other mental and behavioral disorders: Secondary | ICD-10-CM

## 2016-04-15 DIAGNOSIS — R630 Anorexia: Secondary | ICD-10-CM

## 2016-04-15 DIAGNOSIS — R404 Transient alteration of awareness: Secondary | ICD-10-CM | POA: Diagnosis not present

## 2016-04-15 DIAGNOSIS — I480 Paroxysmal atrial fibrillation: Secondary | ICD-10-CM | POA: Diagnosis not present

## 2016-04-15 DIAGNOSIS — F0280 Dementia in other diseases classified elsewhere without behavioral disturbance: Secondary | ICD-10-CM | POA: Diagnosis present

## 2016-04-15 DIAGNOSIS — Z79899 Other long term (current) drug therapy: Secondary | ICD-10-CM

## 2016-04-15 DIAGNOSIS — F0390 Unspecified dementia without behavioral disturbance: Secondary | ICD-10-CM | POA: Diagnosis present

## 2016-04-15 DIAGNOSIS — F331 Major depressive disorder, recurrent, moderate: Secondary | ICD-10-CM | POA: Diagnosis not present

## 2016-04-15 DIAGNOSIS — Z515 Encounter for palliative care: Secondary | ICD-10-CM

## 2016-04-15 DIAGNOSIS — I10 Essential (primary) hypertension: Secondary | ICD-10-CM

## 2016-04-15 DIAGNOSIS — R262 Difficulty in walking, not elsewhere classified: Secondary | ICD-10-CM

## 2016-04-15 DIAGNOSIS — M545 Low back pain: Secondary | ICD-10-CM | POA: Diagnosis not present

## 2016-04-15 DIAGNOSIS — Z8249 Family history of ischemic heart disease and other diseases of the circulatory system: Secondary | ICD-10-CM

## 2016-04-15 DIAGNOSIS — H353290 Exudative age-related macular degeneration, unspecified eye, stage unspecified: Secondary | ICD-10-CM | POA: Diagnosis present

## 2016-04-15 DIAGNOSIS — R410 Disorientation, unspecified: Secondary | ICD-10-CM | POA: Diagnosis present

## 2016-04-15 DIAGNOSIS — I350 Nonrheumatic aortic (valve) stenosis: Secondary | ICD-10-CM | POA: Diagnosis present

## 2016-04-15 DIAGNOSIS — E785 Hyperlipidemia, unspecified: Secondary | ICD-10-CM | POA: Diagnosis present

## 2016-04-15 DIAGNOSIS — I447 Left bundle-branch block, unspecified: Secondary | ICD-10-CM | POA: Diagnosis present

## 2016-04-15 DIAGNOSIS — Z66 Do not resuscitate: Secondary | ICD-10-CM | POA: Diagnosis present

## 2016-04-15 DIAGNOSIS — Z952 Presence of prosthetic heart valve: Secondary | ICD-10-CM

## 2016-04-15 DIAGNOSIS — R4182 Altered mental status, unspecified: Secondary | ICD-10-CM

## 2016-04-15 DIAGNOSIS — Z7189 Other specified counseling: Secondary | ICD-10-CM

## 2016-04-15 DIAGNOSIS — Z7901 Long term (current) use of anticoagulants: Secondary | ICD-10-CM

## 2016-04-15 LAB — BASIC METABOLIC PANEL
ANION GAP: 8 (ref 5–15)
BUN: 21 mg/dL — ABNORMAL HIGH (ref 6–20)
CALCIUM: 9 mg/dL (ref 8.9–10.3)
CO2: 30 mmol/L (ref 22–32)
CREATININE: 1.28 mg/dL — AB (ref 0.44–1.00)
Chloride: 100 mmol/L — ABNORMAL LOW (ref 101–111)
GFR, EST AFRICAN AMERICAN: 42 mL/min — AB (ref >60)
GFR, EST NON AFRICAN AMERICAN: 36 mL/min — AB (ref >60)
GLUCOSE: 106 mg/dL — AB (ref 65–99)
Potassium: 3.3 mmol/L — ABNORMAL LOW (ref 3.5–5.1)
Sodium: 138 mmol/L (ref 135–145)

## 2016-04-15 LAB — CBC WITH DIFFERENTIAL/PLATELET
Basophils Absolute: 0 10*3/uL (ref 0.0–0.1)
Basophils Relative: 0 %
EOS ABS: 0.1 10*3/uL (ref 0.0–0.7)
EOS PCT: 2 %
HCT: 42.3 % (ref 36.0–46.0)
HEMOGLOBIN: 13.9 g/dL (ref 12.0–15.0)
LYMPHS ABS: 2 10*3/uL (ref 0.7–4.0)
LYMPHS PCT: 26 %
MCH: 28.5 pg (ref 26.0–34.0)
MCHC: 32.9 g/dL (ref 30.0–36.0)
MCV: 86.7 fL (ref 78.0–100.0)
MONOS PCT: 7 %
Monocytes Absolute: 0.6 10*3/uL (ref 0.1–1.0)
NEUTROS PCT: 65 %
Neutro Abs: 4.9 10*3/uL (ref 1.7–7.7)
Platelets: 168 10*3/uL (ref 150–400)
RBC: 4.88 MIL/uL (ref 3.87–5.11)
RDW: 15.6 % — ABNORMAL HIGH (ref 11.5–15.5)
WBC: 7.6 10*3/uL (ref 4.0–10.5)

## 2016-04-15 MED ORDER — SERTRALINE HCL 100 MG PO TABS
100.0000 mg | ORAL_TABLET | Freq: Every day | ORAL | Status: DC
  Administered 2016-04-16 – 2016-04-18 (×3): 100 mg via ORAL
  Filled 2016-04-15 (×3): qty 1

## 2016-04-15 MED ORDER — LORAZEPAM 0.5 MG PO TABS
0.5000 mg | ORAL_TABLET | Freq: Two times a day (BID) | ORAL | Status: DC
  Administered 2016-04-16 – 2016-04-18 (×5): 0.5 mg via ORAL
  Filled 2016-04-15 (×5): qty 1

## 2016-04-15 MED ORDER — CALCIUM CARBONATE 1250 (500 CA) MG PO TABS
1.0000 | ORAL_TABLET | Freq: Every day | ORAL | Status: DC
  Administered 2016-04-16 – 2016-04-18 (×3): 500 mg via ORAL
  Filled 2016-04-15 (×3): qty 1

## 2016-04-15 MED ORDER — DONEPEZIL HCL 10 MG PO TABS
10.0000 mg | ORAL_TABLET | Freq: Every day | ORAL | Status: DC
  Administered 2016-04-16 – 2016-04-17 (×2): 10 mg via ORAL
  Filled 2016-04-15 (×2): qty 1

## 2016-04-15 MED ORDER — APIXABAN 2.5 MG PO TABS
2.5000 mg | ORAL_TABLET | Freq: Two times a day (BID) | ORAL | Status: DC
  Administered 2016-04-16 – 2016-04-18 (×5): 2.5 mg via ORAL
  Filled 2016-04-15 (×5): qty 1

## 2016-04-15 MED ORDER — AMLODIPINE BESYLATE 5 MG PO TABS
2.5000 mg | ORAL_TABLET | Freq: Every day | ORAL | Status: DC
  Administered 2016-04-16 – 2016-04-18 (×3): 2.5 mg via ORAL
  Filled 2016-04-15 (×3): qty 1

## 2016-04-15 MED ORDER — MEMANTINE HCL 10 MG PO TABS
10.0000 mg | ORAL_TABLET | Freq: Two times a day (BID) | ORAL | Status: DC
  Administered 2016-04-16 – 2016-04-18 (×5): 10 mg via ORAL
  Filled 2016-04-15 (×5): qty 1

## 2016-04-15 MED ORDER — SODIUM CHLORIDE 0.9% FLUSH
3.0000 mL | Freq: Two times a day (BID) | INTRAVENOUS | Status: DC
  Administered 2016-04-16 – 2016-04-18 (×4): 3 mL via INTRAVENOUS

## 2016-04-15 MED ORDER — SODIUM CHLORIDE 0.9 % IV BOLUS (SEPSIS)
1000.0000 mL | Freq: Once | INTRAVENOUS | Status: AC
  Administered 2016-04-15: 1000 mL via INTRAVENOUS

## 2016-04-15 MED ORDER — ADULT MULTIVITAMIN W/MINERALS CH
1.0000 | ORAL_TABLET | Freq: Every day | ORAL | Status: DC
  Administered 2016-04-16 – 2016-04-18 (×3): 1 via ORAL
  Filled 2016-04-15 (×3): qty 1

## 2016-04-15 MED ORDER — MIRTAZAPINE 15 MG PO TABS: 30.0000 mg | ORAL_TABLET | Freq: Every day | ORAL | Status: DC

## 2016-04-15 MED ORDER — ATORVASTATIN CALCIUM 20 MG PO TABS
20.0000 mg | ORAL_TABLET | Freq: Every day | ORAL | Status: DC
  Administered 2016-04-16 – 2016-04-18 (×3): 20 mg via ORAL
  Filled 2016-04-15 (×3): qty 1

## 2016-04-15 NOTE — ED Provider Notes (Signed)
[facility]-EM[facility]ENCY DEPT Provider Note   CSN: [id number] Arrival date & time: 04/15/16  1805     History   Chief Complaint Chief Complaint  Patient presents with  . Near Syncope    HPI Rebecca [name] is a 82 y.o. female.  Level V caveat for dementia. Most of history obtained from daughter. Patient was attempting to shower when she almost passed out. She complains of low back pain, but this is not new. Her daughter reports her behavior is different. She is not thinking as clearly. She is more confused than usual. No chest pain, dyspnea, fever, sweats, chills, dysuria, cough      Past Medical History:  Diagnosis Date  . Age-related macular degeneration, wet, right eye (HCC)   . Aortic stenosis   . Dementia    "slight"/daughter 12/02/2013  . Depression   . Heart murmur    "slight"  . High cholesterol   . Hypertension   . Panic attacks   . Paroxysmal atrial fibrillation (HCC)   . S/P TAVR (transcatheter aortic valve replacement) 02/02/2014   23 mm Edwards Sapien XT transcatheter heart valve placed via open right transfemoral approach  . Syncope 08/21/2012  . UTI (lower urinary tract infection)     Patient Active Problem List   Diagnosis Date Noted  . MDD (major depressive disorder), single episode, moderate (HCC) 07/04/2015  . S/P TAVR (transcatheter aortic valve replacement) 02/02/2014  . Severe aortic stenosis 02/02/2014  . Sinus tachycardia 12/04/2013  . Sinus bradycardia 12/04/2013  . Encounter for therapeutic drug monitoring 06/17/2013  . Mild dementia 11/06/2012  . Paroxysmal atrial fibrillation (HCC)   . PAF (paroxysmal atrial fibrillation) (HCC) 10/03/2012  . Long term (current) use of anticoagulants 10/03/2012  . Syncope 08/21/2012  . Aortic stenosis, severe 07/09/2012  . UTI (lower urinary tract infection) 07/08/2012  . BRONCHIECTASIS 12/09/2007  . SINUSITIS, CHRONIC 07/07/2007  . BRONCHITIS, CHRONIC 07/07/2007  . HYPERLIPIDEMIA 06/09/2007  . Essential  hypertension 06/09/2007  . ALLERGIC RHINITIS 06/09/2007  . OSTEOPOROSIS 06/09/2007    Past Surgical History:  Procedure Laterality Date  . CARDIAC CATHETERIZATION  04/2013  . EYE SURGERY     cataracts  . INTRAOPERATIVE TRANSESOPHAGEAL ECHOCARDIOGRAM N/A 02/02/2014   Procedure: INTRAOPERATIVE TRANSESOPHAGEAL ECHOCARDIOGRAM;  Surgeon: Tonny BollmanMichael Cooper, MD;  [facility];  Service: Open Heart Surgery;  Laterality: N/A;  . LEFT AND RIGHT HEART CATHETERIZATION WITH CORONARY ANGIOGRAM N/A 05/25/2013   Procedure: LEFT AND RIGHT HEART CATHETERIZATION WITH CORONARY ANGIOGRAM;  Surgeon: [name] SwazilandJordan, MD;  [facility];  Service: Cardiovascular;  Laterality: N/A;  . TONSILLECTOMY    . TRANSCATHETER AORTIC VALVE REPLACEMENT, TRANSFEMORAL  02/02/2014   Edwards Sapien XT THV (size 23 mm, model # 9300TFX, serial # [id number])  . TRANSCATHETER AORTIC VALVE REPLACEMENT, TRANSFEMORAL N/A 02/02/2014   Procedure: TRANSCATHETER AORTIC VALVE REPLACEMENT, TRANSFEMORAL;  Surgeon: Tonny BollmanMichael Cooper, MD;  [facility];  Service: Open Heart Surgery;  Laterality: N/A;    OB History    No data available       Home Medications    Prior to Admission medications   Medication Sig Start Date End Date Taking? Authorizing Provider  amLODipine (NORVASC) 2.5 MG tablet Take 1 tablet (2.5 mg total) by mouth daily. 12/26/15  Yes [name] SwazilandJordan, MD  atorvastatin (LIPITOR) 20 MG tablet Take 20 mg by mouth daily.   Yes Historical Provider, MD  Besifloxacin HCl (BESIVANCE) 0.6 % SUSP Place 1 drop into the right eye 3 (three) times daily. Only  use for 3 days after eye injections. Gets injection every 3 months.   Yes Historical Provider, MD  calcium citrate (CALCITRATE - DOSED IN MG ELEMENTAL CALCIUM) 950 MG tablet Take 1 tablet by mouth daily.   Yes Historical Provider, MD  donepezil (ARICEPT) 10 MG tablet Take 1 tablet (10 mg total) by mouth at bedtime. 01/09/16  Yes Vikram R Penumalli, MD  ELIQUIS 2.5 MG TABS tablet TAKE  1 TABLET (2.5 MG TOTAL) BY MOUTH 2 (TWO) TIMES DAILY. 02/06/16  Yes [name] [name], MD  LORazepam (ATIVAN) 0.5 MG tablet Take 1 tablet by mouth 2 (two) times daily. 07/12/15  Yes Historical Provider, MD  memantine (NAMENDA) 10 MG tablet Take 1 tablet (10 mg total) by mouth 2 (two) times daily. 01/09/16  Yes Suanne Marker, MD  mirtazapine (REMERON) 30 MG tablet Take 30 mg by mouth at bedtime.   Yes Historical Provider, MD  Multiple Vitamin (MULTIVITAMIN) capsule Take 1 capsule by mouth daily.   Yes Historical Provider, MD  PRESCRIPTION MEDICATION Place into the right eye every 3 (three) months. Eye injection at eye dr's office   Yes Historical Provider, MD  sertraline (ZOLOFT) 100 MG tablet Take 1 tablet by mouth every morning.  07/20/15  Yes Historical Provider, MD    Family History Family History  Problem Relation Age of Onset  . Heart attack Mother 17  . Heart Problems Father   . Heart attack Father 24  . Cerebral aneurysm Sister 31  . Sudden death Brother 69    Social History Social History  Substance Use Topics  . Smoking status: Former Smoker    Packs/day: 0.40    Years: 30.00    Types: Cigarettes    Quit date: 04/30/1960  . Smokeless tobacco: Never Used  . Alcohol use No     Allergies   Patient has no known allergies.   Review of Systems Review of Systems  Reason unable to perform ROS: Altered mental status.     Physical Exam Updated Vital Signs BP 140/70   Pulse 73   Temp 97.8 F (36.6 C) (Oral)   Resp 16   Ht 5\' 3"  (1.6 m)   Wt 132 lb (59.9 kg)   SpO2 97%   BMI 23.38 kg/m   Physical Exam  Constitutional:  Patient is confused (daughter reports more than usual)  HENT:  Head: Normocephalic and atraumatic.  Eyes: Conjunctivae are normal.  Neck: Neck supple.  Cardiovascular: Normal rate and regular rhythm.   Pulmonary/Chest: Effort normal and breath sounds normal.  Abdominal: Soft. Bowel sounds are normal.  Musculoskeletal:  unable  Neurological:    sleepy  Skin: Skin is warm and dry.  Psychiatric:  Flat affect  Nursing note and vitals reviewed.    ED Treatments / Results  Labs (all labs ordered are listed, but only abnormal results are displayed) Labs Reviewed  CBC WITH DIFFERENTIAL/PLATELET - Abnormal; Notable for the following:       Result Value   RDW 15.6 (*)    All other components within normal limits  BASIC METABOLIC PANEL - Abnormal; Notable for the following:    Potassium 3.3 (*)    Chloride 100 (*)    Glucose, Bld 106 (*)    BUN 21 (*)    Creatinine, Ser 1.28 (*)    GFR calc non Af Amer 36 (*)    GFR calc Af Amer 42 (*)    All other components within normal limits  URINALYSIS, ROUTINE W REFLEX  MICROSCOPIC    EKG  EKG Interpretation None       Radiology Dg Chest 1 View  Result Date: 04/15/2016 CLINICAL DATA:  Syncopal episode EXAM: CHEST 1 VIEW COMPARISON:  07/05/2015 and 04/11/2015 radiographs. FINDINGS: 1954 hour. Stable mild cardiac enlargement and aortic atherosclerosis status post TAVR. The lungs are clear. There is no pleural effusion. Probable skin fold overlies the lower right chest laterally. No evidence of pneumothorax. The bones appear unchanged. IMPRESSION: Stable chest.  No acute cardiopulmonary process. Electronically Signed   By: Carey BullocksWilliam  Veazey M.D.   On: 04/15/2016 20:31   Dg Lumbar Spine Complete  Result Date: 04/15/2016 CLINICAL DATA:  Near syncope while showering. Pt denies LOC. Pt states she is feeling more tired and has lower back pain. Lower back pain x 20 years. EXAM: LUMBAR SPINE - COMPLETE 4+ VIEW COMPARISON:  06/03/2014 CT FINDINGS: Mild degenerative changes are identified throughout the lumbar spine. There is increased disc height loss and sclerosis at L1-2. Question of retrolisthesis of L1 on L2 versus retropulsion of fragments. Question of discitis at this level. There is anterolisthesis of L4 on L5 by 4 mm. Dense atherosclerotic calcification of the abdominal aorta. Within  the left hemipelvis there are large coarse calcifications consistent with a dermoid. IMPRESSION: 1. Question of discitis versus significant progression of degenerative changes at L1-2. 2. Consider further imaging evaluation.  MRI is recommended. 3. Left pelvic dermoid. Electronically Signed   By: Norva PavlovElizabeth  Brown M.D.   On: 04/15/2016 20:35   Ct Head Wo Contrast  Result Date: 04/15/2016 CLINICAL DATA:  LETHARGY WITH SYNCOPE near syncope while showering. Pt denies LOC. Pt states she is feeling more tired and has lower back pain PMH: HTN, High cholesterol; UTI (lower urinary tract infection); Aortic stenosis; syncope. EXAM: CT HEAD WITHOUT CONTRAST TECHNIQUE: Contiguous axial images were obtained from the base of the skull through the vertex without intravenous contrast. COMPARISON:  12/02/2013 FINDINGS: Brain: There is mild central and cortical atrophy. Periventricular white matter changes are consistent with small vessel disease. There is no intra or extra-axial fluid collection or mass lesion. The basilar cisterns and ventricles have a normal appearance. There is no CT evidence for acute infarction or hemorrhage. Vascular: There is atherosclerotic calcification of the carotid siphonsand vertebral arteries. Skull: Normal. Negative for fracture or focal lesion. Sinuses/Orbits: Significant mucoperiosteal thickening of the right maxillary sinus. Status post resection of medial walls of maxillary sinuses bilaterally. Other: None IMPRESSION: 1.  No evidence for acute intracranial abnormality. 2. Atrophy and small vessel disease. 3. Sinus disease. Electronically Signed   By: Norva PavlovElizabeth  Brown M.D.   On: 04/15/2016 21:42    Procedures Procedures (including critical care time)  Medications Ordered in ED Medications  sodium chloride 0.9 % bolus 1,000 mL (1,000 mLs Intravenous New Bag/Given 04/15/16 2204)     Initial Impression / Assessment and Plan / ED Course  I have reviewed the triage vital signs and the  nursing notes.  Pertinent labs & imaging results that were available during my care of the patient were reviewed by me and considered in my medical decision making (see chart for details).  Clinical Course     Daughter reports patient is more confused than usual. Plain films of lumbar spine show a question of discitis versus progression of degenerative changes at L1-2. Chest x-ray negative. CT head shows no acute findings. Screening labs are reasonable. Admit to general medicine.  Final Clinical Impressions(s) / ED Diagnoses   Final diagnoses:  Near syncope  Altered mental status, unspecified altered mental status type    New Prescriptions New Prescriptions   No medications on file     Donnetta Hutching, MD 04/15/16 2319

## 2016-04-15 NOTE — ED Triage Notes (Signed)
[facility] complaining of near syncope while showering. Pt denies LOC. Pt states she is feeling more tired and has lower back pain. Pt lives in the basement of her daughter's home.

## 2016-04-15 NOTE — ED Notes (Signed)
Patient transported to CT 

## 2016-04-15 NOTE — H&P (Addendum)
History and Physical    Rebecca [name] ZOX:[id number] DOB: [birth year] DOA: 04/15/2016   PCP: [name], MD Chief Complaint:  Chief Complaint  Patient presents with  . Near Syncope    HPI: Rebecca [name] is a 82 y.o. female with medical history significant of A.Fib on eliquis, prior AS s/p TAVR in 2015 now with 22 mean gradient across valve on echo just 1 month ago, mild dementia.  Lives with her daughter at baseline.  Today had near syncope in shower.  Per daughter she is slightly altered mental status wise from baseline, not thinking as clearly.  Patient c/o low back pain; however, this is chronic and not at all new.  No CP, SOB, fever, chills, dysuria, cough, etc.  ED Course: work up in ED thus far unremarkable.  UA is pending.  CT head negative.  Review of Systems: As per HPI otherwise 10 point review of systems negative.    Past Medical History:  Diagnosis Date  . Age-related macular degeneration, wet, right eye (HCC)   . Aortic stenosis   . Dementia    "slight"/daughter 12/02/2013  . Depression   . Heart murmur    "slight"  . High cholesterol   . Hypertension   . Panic attacks   . Paroxysmal atrial fibrillation (HCC)   . S/P TAVR (transcatheter aortic valve replacement) 02/02/2014   23 mm Edwards Sapien XT transcatheter heart valve placed via open right transfemoral approach  . Syncope 08/21/2012  . UTI (lower urinary tract infection)     Past Surgical History:  Procedure Laterality Date  . CARDIAC CATHETERIZATION  04/2013  . EYE SURGERY     cataracts  . INTRAOPERATIVE TRANSESOPHAGEAL ECHOCARDIOGRAM N/A 02/02/2014   Procedure: INTRAOPERATIVE TRANSESOPHAGEAL ECHOCARDIOGRAM;  Surgeon: Tonny Bollman, MD;  Location: [facility];  Service: Open Heart Surgery;  Laterality: N/A;  . LEFT AND RIGHT HEART CATHETERIZATION WITH CORONARY ANGIOGRAM N/A 05/25/2013   Procedure: LEFT AND RIGHT HEART CATHETERIZATION WITH CORONARY ANGIOGRAM;  Surgeon: [name] [name], MD;  Location: [facility] CATH LAB;   Service: Cardiovascular;  Laterality: N/A;  . TONSILLECTOMY    . TRANSCATHETER AORTIC VALVE REPLACEMENT, TRANSFEMORAL  02/02/2014   Edwards Sapien XT THV (size 23 mm, model # 9300TFX, serial # [id number])  . TRANSCATHETER AORTIC VALVE REPLACEMENT, TRANSFEMORAL N/A 02/02/2014   Procedure: TRANSCATHETER AORTIC VALVE REPLACEMENT, TRANSFEMORAL;  Surgeon: Tonny Bollman, MD;  Location: [facility] OR;  Service: Open Heart Surgery;  Laterality: N/A;     reports that she quit smoking about 55 years ago. Her smoking use included Cigarettes. She has a 12.00 pack-year smoking history. She has never used smokeless tobacco. She reports that she does not drink alcohol or use drugs.  No Known Allergies  Family History  Problem Relation Age of Onset  . Heart attack Mother 31  . Heart Problems Father   . Heart attack Father 51  . Cerebral aneurysm Sister 6  . Sudden death Brother 64      Prior to Admission medications   Medication Sig Start Date End Date Taking? Authorizing Provider  amLODipine (NORVASC) 2.5 MG tablet Take 1 tablet (2.5 mg total) by mouth daily. 12/26/15  Yes [name] [name], MD  atorvastatin (LIPITOR) 20 MG tablet Take 20 mg by mouth daily.   Yes Historical Provider, MD  Besifloxacin HCl (BESIVANCE) 0.6 % SUSP Place 1 drop into the right eye 3 (three) times daily. Only use for 3 days after eye injections. Gets injection every 3 months.   Yes Historical Provider,  MD  calcium citrate (CALCITRATE - DOSED IN MG ELEMENTAL CALCIUM) 950 MG tablet Take 1 tablet by mouth daily.   Yes Historical Provider, MD  donepezil (ARICEPT) 10 MG tablet Take 1 tablet (10 mg total) by mouth at bedtime. 01/09/16  Yes Vikram R Penumalli, MD  ELIQUIS 2.5 MG TABS tablet TAKE 1 TABLET (2.5 MG TOTAL) BY MOUTH 2 (TWO) TIMES DAILY. 02/06/16  Yes [name] SwazilandJordan, MD  LORazepam (ATIVAN) 0.5 MG tablet Take 1 tablet by mouth 2 (two) times daily. 07/12/15  Yes Historical Provider, MD  memantine (NAMENDA) 10 MG tablet Take 1 tablet (10  mg total) by mouth 2 (two) times daily. 01/09/16  Yes Suanne MarkerVikram R Penumalli, MD  mirtazapine (REMERON) 30 MG tablet Take 30 mg by mouth at bedtime.   Yes Historical Provider, MD  Multiple Vitamin (MULTIVITAMIN) capsule Take 1 capsule by mouth daily.   Yes Historical Provider, MD  PRESCRIPTION MEDICATION Place into the right eye every 3 (three) months. Eye injection at eye dr's office   Yes Historical Provider, MD  sertraline (ZOLOFT) 100 MG tablet Take 1 tablet by mouth every morning.  07/20/15  Yes Historical Provider, MD    Physical Exam: Vitals:   04/15/16 1930 04/15/16 2030 04/15/16 2130 04/15/16 2200  BP: 125/64 127/65 116/59 140/70  Pulse: 68 72 70 73  Resp: 16  15 16   Temp:      TempSrc:      SpO2: 97% 98% 97% 97%  Weight:      Height:          Constitutional: NAD, calm, comfortable Eyes: PERRL, lids and conjunctivae normal ENMT: Mucous membranes are moist. Posterior pharynx clear of any exudate or lesions.Normal dentition.  Neck: normal, supple, no masses, no thyromegaly Respiratory: clear to auscultation bilaterally, no wheezing, no crackles. Normal respiratory effort. No accessory muscle use.  Cardiovascular: Regular rate and rhythm, no murmurs / rubs / gallops. No extremity edema. 2+ pedal pulses. No carotid bruits.  Abdomen: no tenderness, no masses palpated. No hepatosplenomegaly. Bowel sounds positive.  Musculoskeletal: no clubbing / cyanosis. No joint deformity upper and lower extremities. Good ROM, no contractures. Normal muscle tone.  Skin: no rashes, lesions, ulcers. No induration Neurologic: CN 2-12 grossly intact. Sensation intact, DTR normal. Strength 5/5 in all 4.  Psychiatric: Normal judgment and insight. Alert and oriented x 3. Normal mood.    Labs on Admission: I have personally reviewed following labs and imaging studies  CBC:  Recent Labs Lab 04/15/16 2150  WBC 7.6  NEUTROABS 4.9  HGB 13.9  HCT 42.3  MCV 86.7  PLT 168   Basic Metabolic  Panel:  Recent Labs Lab 04/15/16 2150  NA 138  K 3.3*  CL 100*  CO2 30  GLUCOSE 106*  BUN 21*  CREATININE 1.28*  CALCIUM 9.0   GFR: Estimated Creatinine Clearance: 25.1 mL/min (by C-G formula based on SCr of 1.28 mg/dL (H)). Liver Function Tests: No results for input(s): AST, ALT, ALKPHOS, BILITOT, PROT, ALBUMIN in the last 168 hours. No results for input(s): LIPASE, AMYLASE in the last 168 hours. No results for input(s): AMMONIA in the last 168 hours. Coagulation Profile: No results for input(s): INR, PROTIME in the last 168 hours. Cardiac Enzymes: No results for input(s): CKTOTAL, CKMB, CKMBINDEX, TROPONINI in the last 168 hours. BNP (last 3 results) No results for input(s): PROBNP in the last 8760 hours. HbA1C: No results for input(s): HGBA1C in the last 72 hours. CBG: No results for input(s): GLUCAP in  the last 168 hours. Lipid Profile: No results for input(s): CHOL, HDL, LDLCALC, TRIG, CHOLHDL, LDLDIRECT in the last 72 hours. Thyroid Function Tests: No results for input(s): TSH, T4TOTAL, FREET4, T3FREE, THYROIDAB in the last 72 hours. Anemia Panel: No results for input(s): VITAMINB12, FOLATE, FERRITIN, TIBC, IRON, RETICCTPCT in the last 72 hours. Urine analysis:    Component Value Date/Time   COLORURINE AMBER (A) 07/03/2015 1614   APPEARANCEUR CLOUDY (A) 07/03/2015 1614   LABSPEC 1.031 (H) 07/03/2015 1614   PHURINE 5.5 07/03/2015 1614   GLUCOSEU NEGATIVE 07/03/2015 1614   HGBUR NEGATIVE 07/03/2015 1614   BILIRUBINUR NEGATIVE 07/03/2015 1614   KETONESUR NEGATIVE 07/03/2015 1614   PROTEINUR NEGATIVE 07/03/2015 1614   UROBILINOGEN 0.2 06/03/2014 0740   NITRITE NEGATIVE 07/03/2015 1614   LEUKOCYTESUR LARGE (A) 07/03/2015 1614   Sepsis Labs: @LABRCNTIP (procalcitonin:4,lacticidven:4) )No results found for this or any previous visit (from the past 240 hour(s)).   Radiological Exams on Admission: Dg Chest 1 View  Result Date: 04/15/2016 CLINICAL DATA:   Syncopal episode EXAM: CHEST 1 VIEW COMPARISON:  07/05/2015 and 04/11/2015 radiographs. FINDINGS: 1954 hour. Stable mild cardiac enlargement and aortic atherosclerosis status post TAVR. The lungs are clear. There is no pleural effusion. Probable skin fold overlies the lower right chest laterally. No evidence of pneumothorax. The bones appear unchanged. IMPRESSION: Stable chest.  No acute cardiopulmonary process. Electronically Signed   By: Carey Bullocks M.D.   On: 04/15/2016 20:31   Dg Lumbar Spine Complete  Result Date: 04/15/2016 CLINICAL DATA:  Near syncope while showering. Pt denies LOC. Pt states she is feeling more tired and has lower back pain. Lower back pain x 20 years. EXAM: LUMBAR SPINE - COMPLETE 4+ VIEW COMPARISON:  06/03/2014 CT FINDINGS: Mild degenerative changes are identified throughout the lumbar spine. There is increased disc height loss and sclerosis at L1-2. Question of retrolisthesis of L1 on L2 versus retropulsion of fragments. Question of discitis at this level. There is anterolisthesis of L4 on L5 by 4 mm. Dense atherosclerotic calcification of the abdominal aorta. Within the left hemipelvis there are large coarse calcifications consistent with a dermoid. IMPRESSION: 1. Question of discitis versus significant progression of degenerative changes at L1-2. 2. Consider further imaging evaluation.  MRI is recommended. 3. Left pelvic dermoid. Electronically Signed   By: Norva Pavlov M.D.   On: 04/15/2016 20:35   Ct Head Wo Contrast  Result Date: 04/15/2016 CLINICAL DATA:  LETHARGY WITH SYNCOPE near syncope while showering. Pt denies LOC. Pt states she is feeling more tired and has lower back pain PMH: HTN, High cholesterol; UTI (lower urinary tract infection); Aortic stenosis; syncope. EXAM: CT HEAD WITHOUT CONTRAST TECHNIQUE: Contiguous axial images were obtained from the base of the skull through the vertex without intravenous contrast. COMPARISON:  12/02/2013 FINDINGS: Brain:  There is mild central and cortical atrophy. Periventricular white matter changes are consistent with small vessel disease. There is no intra or extra-axial fluid collection or mass lesion. The basilar cisterns and ventricles have a normal appearance. There is no CT evidence for acute infarction or hemorrhage. Vascular: There is atherosclerotic calcification of the carotid siphonsand vertebral arteries. Skull: Normal. Negative for fracture or focal lesion. Sinuses/Orbits: Significant mucoperiosteal thickening of the right maxillary sinus. Status post resection of medial walls of maxillary sinuses bilaterally. Other: None IMPRESSION: 1.  No evidence for acute intracranial abnormality. 2. Atrophy and small vessel disease. 3. Sinus disease. Electronically Signed   By: Norva Pavlov M.D.   On: 04/15/2016 21:42  EKG: Independently reviewed.  Assessment/Plan Principal Problem:   Near syncope Active Problems:   Mild dementia   Confusion    1. Near syncope - and confusion 1. UA pending, if suspicious of UTI then would treat. 2. If UA negative then would obtain MRI brain as she does have stroke risk factors (PAF, on eliquis), but she certainly seems non-focal on exam. 3. Tele monitor: EKG shows LBBB but that's chronic too 4. Not ordering echo as this was done just last month. 2. Mild dementia - continue home meds   DVT prophylaxis: Eliquis Code Status: DNR, yellow sheet at bedside Family Communication: No family in room Consults called: None Admission status: Admit to obs   Hillary BowGARDNER, Dannya Pitkin M. DO [facility] Pager [phone] from 7PM-7AM  If 7AM-7PM, please contact the day physician for the patient [email or web address] Password [email or web address]  04/15/2016, 11:59 PM

## 2016-04-16 ENCOUNTER — Observation Stay (HOSPITAL_COMMUNITY): Payer: Medicare Other

## 2016-04-16 DIAGNOSIS — Z818 Family history of other mental and behavioral disorders: Secondary | ICD-10-CM | POA: Diagnosis not present

## 2016-04-16 DIAGNOSIS — I447 Left bundle-branch block, unspecified: Secondary | ICD-10-CM | POA: Diagnosis present

## 2016-04-16 DIAGNOSIS — R410 Disorientation, unspecified: Secondary | ICD-10-CM | POA: Diagnosis not present

## 2016-04-16 DIAGNOSIS — R404 Transient alteration of awareness: Secondary | ICD-10-CM | POA: Diagnosis not present

## 2016-04-16 DIAGNOSIS — I5032 Chronic diastolic (congestive) heart failure: Secondary | ICD-10-CM | POA: Diagnosis present

## 2016-04-16 DIAGNOSIS — Z515 Encounter for palliative care: Secondary | ICD-10-CM | POA: Diagnosis not present

## 2016-04-16 DIAGNOSIS — Z79899 Other long term (current) drug therapy: Secondary | ICD-10-CM | POA: Diagnosis not present

## 2016-04-16 DIAGNOSIS — G4720 Circadian rhythm sleep disorder, unspecified type: Secondary | ICD-10-CM | POA: Diagnosis not present

## 2016-04-16 DIAGNOSIS — E785 Hyperlipidemia, unspecified: Secondary | ICD-10-CM | POA: Diagnosis present

## 2016-04-16 DIAGNOSIS — Z66 Do not resuscitate: Secondary | ICD-10-CM | POA: Diagnosis present

## 2016-04-16 DIAGNOSIS — F410 Panic disorder [episodic paroxysmal anxiety] without agoraphobia: Secondary | ICD-10-CM | POA: Diagnosis present

## 2016-04-16 DIAGNOSIS — Z952 Presence of prosthetic heart valve: Secondary | ICD-10-CM | POA: Diagnosis not present

## 2016-04-16 DIAGNOSIS — F331 Major depressive disorder, recurrent, moderate: Secondary | ICD-10-CM | POA: Diagnosis not present

## 2016-04-16 DIAGNOSIS — R55 Syncope and collapse: Principal | ICD-10-CM

## 2016-04-16 DIAGNOSIS — R630 Anorexia: Secondary | ICD-10-CM | POA: Diagnosis not present

## 2016-04-16 DIAGNOSIS — Z87891 Personal history of nicotine dependence: Secondary | ICD-10-CM | POA: Diagnosis not present

## 2016-04-16 DIAGNOSIS — I10 Essential (primary) hypertension: Secondary | ICD-10-CM | POA: Diagnosis not present

## 2016-04-16 DIAGNOSIS — R400 Somnolence: Secondary | ICD-10-CM | POA: Diagnosis not present

## 2016-04-16 DIAGNOSIS — I441 Atrioventricular block, second degree: Secondary | ICD-10-CM | POA: Diagnosis not present

## 2016-04-16 DIAGNOSIS — G479 Sleep disorder, unspecified: Secondary | ICD-10-CM | POA: Diagnosis present

## 2016-04-16 DIAGNOSIS — I480 Paroxysmal atrial fibrillation: Secondary | ICD-10-CM | POA: Diagnosis present

## 2016-04-16 DIAGNOSIS — F0390 Unspecified dementia without behavioral disturbance: Secondary | ICD-10-CM

## 2016-04-16 DIAGNOSIS — M810 Age-related osteoporosis without current pathological fracture: Secondary | ICD-10-CM | POA: Diagnosis present

## 2016-04-16 DIAGNOSIS — E860 Dehydration: Secondary | ICD-10-CM | POA: Diagnosis present

## 2016-04-16 DIAGNOSIS — H353290 Exudative age-related macular degeneration, unspecified eye, stage unspecified: Secondary | ICD-10-CM | POA: Diagnosis present

## 2016-04-16 DIAGNOSIS — Z7189 Other specified counseling: Secondary | ICD-10-CM | POA: Diagnosis not present

## 2016-04-16 DIAGNOSIS — I110 Hypertensive heart disease with heart failure: Secondary | ICD-10-CM | POA: Diagnosis present

## 2016-04-16 DIAGNOSIS — Z7901 Long term (current) use of anticoagulants: Secondary | ICD-10-CM | POA: Diagnosis not present

## 2016-04-16 DIAGNOSIS — R4182 Altered mental status, unspecified: Secondary | ICD-10-CM | POA: Diagnosis present

## 2016-04-16 DIAGNOSIS — F0280 Dementia in other diseases classified elsewhere without behavioral disturbance: Secondary | ICD-10-CM | POA: Diagnosis present

## 2016-04-16 DIAGNOSIS — I442 Atrioventricular block, complete: Secondary | ICD-10-CM | POA: Diagnosis not present

## 2016-04-16 DIAGNOSIS — G3183 Dementia with Lewy bodies: Secondary | ICD-10-CM | POA: Diagnosis present

## 2016-04-16 DIAGNOSIS — Z8249 Family history of ischemic heart disease and other diseases of the circulatory system: Secondary | ICD-10-CM | POA: Diagnosis not present

## 2016-04-16 DIAGNOSIS — I350 Nonrheumatic aortic (valve) stenosis: Secondary | ICD-10-CM | POA: Diagnosis not present

## 2016-04-16 LAB — URINALYSIS, ROUTINE W REFLEX MICROSCOPIC
BILIRUBIN URINE: NEGATIVE
Glucose, UA: 50 mg/dL — AB
HGB URINE DIPSTICK: NEGATIVE
Ketones, ur: NEGATIVE mg/dL
Leukocytes, UA: NEGATIVE
Nitrite: NEGATIVE
PH: 6 (ref 5.0–8.0)
Protein, ur: NEGATIVE mg/dL
SPECIFIC GRAVITY, URINE: 1.014 (ref 1.005–1.030)

## 2016-04-16 LAB — GLUCOSE, CAPILLARY
GLUCOSE-CAPILLARY: 95 mg/dL (ref 65–99)
GLUCOSE-CAPILLARY: 135 mg/dL — AB (ref 65–99)
Glucose-Capillary: 96 mg/dL (ref 65–99)

## 2016-04-16 LAB — BASIC METABOLIC PANEL
Anion gap: 9 (ref 5–15)
BUN: 16 mg/dL (ref 6–20)
CHLORIDE: 107 mmol/L (ref 101–111)
CO2: 26 mmol/L (ref 22–32)
Calcium: 8.7 mg/dL — ABNORMAL LOW (ref 8.9–10.3)
Creatinine, Ser: 1.01 mg/dL — ABNORMAL HIGH (ref 0.44–1.00)
GFR calc Af Amer: 56 mL/min — ABNORMAL LOW (ref >60)
GFR calc non Af Amer: 48 mL/min — ABNORMAL LOW (ref >60)
Glucose, Bld: 91 mg/dL (ref 65–99)
POTASSIUM: 3.7 mmol/L (ref 3.5–5.1)
SODIUM: 142 mmol/L (ref 135–145)

## 2016-04-16 MED ORDER — SODIUM CHLORIDE 0.9 % IV SOLN
INTRAVENOUS | Status: DC
  Administered 2016-04-16: 16:00:00 via INTRAVENOUS

## 2016-04-16 MED ORDER — MIRTAZAPINE 15 MG PO TABS
45.0000 mg | ORAL_TABLET | Freq: Every day | ORAL | Status: DC
  Administered 2016-04-16 – 2016-04-17 (×2): 45 mg via ORAL
  Filled 2016-04-16 (×2): qty 3

## 2016-04-16 MED ORDER — POTASSIUM CHLORIDE CRYS ER 20 MEQ PO TBCR
20.0000 meq | EXTENDED_RELEASE_TABLET | Freq: Once | ORAL | Status: AC
  Administered 2016-04-16: 20 meq via ORAL
  Filled 2016-04-16: qty 1

## 2016-04-16 MED ORDER — ENSURE ENLIVE PO LIQD
237.0000 mL | Freq: Two times a day (BID) | ORAL | Status: DC
  Administered 2016-04-16 – 2016-04-18 (×2): 237 mL via ORAL

## 2016-04-16 MED ORDER — DIVALPROEX SODIUM 500 MG PO DR TAB
500.0000 mg | DELAYED_RELEASE_TABLET | Freq: Two times a day (BID) | ORAL | Status: DC
  Administered 2016-04-16 – 2016-04-18 (×5): 500 mg via ORAL
  Filled 2016-04-16 (×5): qty 1

## 2016-04-16 NOTE — Progress Notes (Signed)
PROGRESS NOTE    Rebecca RoyaltyJennie [name]  AOZ:[id number] DOB: 1925-07-23 DOA: 04/15/2016 PCP: Duane LopeAlan Ross, MD   Outpatient Specialists:     Brief Narrative:  Rebecca [name] is a 82 y.o. female with medical history significant of A.Fib on eliquis, prior AS s/p TAVR in 2015 now with 22 mean gradient across valve on echo just 1 month ago, mild dementia.  Lives with her daughter at baseline.  Today had near syncope in shower.  Per daughter she is slightly altered mental status wise from baseline, not thinking as clearly.  Patient c/o low back pain; however, this is chronic and not at all new.  No CP, SOB, fever, chills, dysuria, cough, etc.   Assessment & Plan:   Principal Problem:   Near syncope Active Problems:   Essential hypertension   Mild dementia   Confusion   ? Seizure like activity -family reports eyelid fluttering at times with no response from patient -neuro consult -EEG negative but not currently having episode  lewy body Dementia with increasing confusion -may be worsening of dementia -follows with neuro as outpatient -family request palliative care consult -U/A negative -MRI pending  Near syncope -Cr elevated, so suspect some dehydration -gentle IVF  Depression -has been in [place] in past -husband died 1 1/2 year ago and patient has not been the same since    DVT prophylaxis:  SCD's  Code Status: DNR   Family Communication: Multiple members at bedside  Disposition Plan:     Consultants:   neuro     Subjective: Family says patient is still not back to baseline -more depressed, not eating--places food in microwave, closet  Objective: Vitals:   04/16/16 0000 04/16/16 0104 04/16/16 0451 04/16/16 0700  BP: 158/73 (!) 158/75 (!) 126/56   Pulse: 73 71 78   Resp: 14 20 20    Temp:  98.1 F (36.7 C) 98.8 F (37.1 C)   TempSrc:  Oral Oral   SpO2: 98% 96% 94% 97%  Weight:  60 kg (132 lb 4.4 oz)    Height:        Intake/Output Summary (Last  24 hours) at 04/16/16 1152 Last data filed at 04/16/16 0009  Gross per 24 hour  Intake             1000 ml  Output                0 ml  Net             1000 ml   Filed Weights   04/15/16 1808 04/16/16 0104  Weight: 59.9 kg (132 lb) 60 kg (132 lb 4.4 oz)    Examination:  General exam: Appears calm and comfortable  Respiratory system: Clear to auscultation. Respiratory effort normal. Cardiovascular system: S1 & S2 heard, RRR. No JVD, murmurs, rubs, gallops or clicks. No pedal edema. Gastrointestinal system: Abdomen is nondistended, soft and nontender. No organomegaly or masses felt. Normal bowel sounds heard. Central nervous system: Alert and oriented. No focal neurological deficits. Extremities: Symmetric 5 x 5 power. Skin: No rashes, lesions or ulcers Psychiatry: Judgement and insight appear normal. Mood & affect appropriate.     Data Reviewed: I have personally reviewed following labs and imaging studies  CBC:  Recent Labs Lab 04/15/16 2150  WBC 7.6  NEUTROABS 4.9  HGB 13.9  HCT 42.3  MCV 86.7  PLT 168   Basic Metabolic Panel:  Recent Labs Lab 04/15/16 2150 04/16/16 0804  NA 138 142  K 3.3* 3.7  CL 100* 107  CO2 30 26  GLUCOSE 106* 91  BUN 21* 16  CREATININE 1.28* 1.01*  CALCIUM 9.0 8.7*   GFR: Estimated Creatinine Clearance: 31.8 mL/min (by C-G formula based on SCr of 1.01 mg/dL (H)). Liver Function Tests: No results for input(s): AST, ALT, ALKPHOS, BILITOT, PROT, ALBUMIN in the last 168 hours. No results for input(s): LIPASE, AMYLASE in the last 168 hours. No results for input(s): AMMONIA in the last 168 hours. Coagulation Profile: No results for input(s): INR, PROTIME in the last 168 hours. Cardiac Enzymes: No results for input(s): CKTOTAL, CKMB, CKMBINDEX, TROPONINI in the last 168 hours. BNP (last 3 results) No results for input(s): PROBNP in the last 8760 hours. HbA1C: No results for input(s): HGBA1C in the last 72 hours. CBG:  Recent  Labs Lab 04/16/16 0724 04/16/16 1017  GLUCAP 96 95   Lipid Profile: No results for input(s): CHOL, HDL, LDLCALC, TRIG, CHOLHDL, LDLDIRECT in the last 72 hours. Thyroid Function Tests: No results for input(s): TSH, T4TOTAL, FREET4, T3FREE, THYROIDAB in the last 72 hours. Anemia Panel: No results for input(s): VITAMINB12, FOLATE, FERRITIN, TIBC, IRON, RETICCTPCT in the last 72 hours. Urine analysis:    Component Value Date/Time   COLORURINE YELLOW 04/15/2016 2357   APPEARANCEUR HAZY (A) 04/15/2016 2357   LABSPEC 1.014 04/15/2016 2357   PHURINE 6.0 04/15/2016 2357   GLUCOSEU 50 (A) 04/15/2016 2357   HGBUR NEGATIVE 04/15/2016 2357   BILIRUBINUR NEGATIVE 04/15/2016 2357   KETONESUR NEGATIVE 04/15/2016 2357   PROTEINUR NEGATIVE 04/15/2016 2357   UROBILINOGEN 0.2 06/03/2014 0740   NITRITE NEGATIVE 04/15/2016 2357   LEUKOCYTESUR NEGATIVE 04/15/2016 2357     )No results found for this or any previous visit (from the past 240 hour(s)).    Anti-infectives    None       Radiology Studies: Dg Chest 1 View  Result Date: 04/15/2016 CLINICAL DATA:  Syncopal episode EXAM: CHEST 1 VIEW COMPARISON:  07/05/2015 and 04/11/2015 radiographs. FINDINGS: 1954 hour. Stable mild cardiac enlargement and aortic atherosclerosis status post TAVR. The lungs are clear. There is no pleural effusion. Probable skin fold overlies the lower right chest laterally. No evidence of pneumothorax. The bones appear unchanged. IMPRESSION: Stable chest.  No acute cardiopulmonary process. Electronically Signed   By: Carey BullocksWilliam  Veazey M.D.   On: 04/15/2016 20:31   Dg Lumbar Spine Complete  Result Date: 04/15/2016 CLINICAL DATA:  Near syncope while showering. Pt denies LOC. Pt states she is feeling more tired and has lower back pain. Lower back pain x 20 years. EXAM: LUMBAR SPINE - COMPLETE 4+ VIEW COMPARISON:  06/03/2014 CT FINDINGS: Mild degenerative changes are identified throughout the lumbar spine. There is  increased disc height loss and sclerosis at L1-2. Question of retrolisthesis of L1 on L2 versus retropulsion of fragments. Question of discitis at this level. There is anterolisthesis of L4 on L5 by 4 mm. Dense atherosclerotic calcification of the abdominal aorta. Within the left hemipelvis there are large coarse calcifications consistent with a dermoid. IMPRESSION: 1. Question of discitis versus significant progression of degenerative changes at L1-2. 2. Consider further imaging evaluation.  MRI is recommended. 3. Left pelvic dermoid. Electronically Signed   By: Norva PavlovElizabeth  Brown M.D.   On: 04/15/2016 20:35   Ct Head Wo Contrast  Result Date: 04/15/2016 CLINICAL DATA:  LETHARGY WITH SYNCOPE near syncope while showering. Pt denies LOC. Pt states she is feeling more tired and has lower back pain PMH: HTN, High cholesterol; UTI (lower urinary tract  infection); Aortic stenosis; syncope. EXAM: CT HEAD WITHOUT CONTRAST TECHNIQUE: Contiguous axial images were obtained from the base of the skull through the vertex without intravenous contrast. COMPARISON:  12/02/2013 FINDINGS: Brain: There is mild central and cortical atrophy. Periventricular white matter changes are consistent with small vessel disease. There is no intra or extra-axial fluid collection or mass lesion. The basilar cisterns and ventricles have a normal appearance. There is no CT evidence for acute infarction or hemorrhage. Vascular: There is atherosclerotic calcification of the carotid siphonsand vertebral arteries. Skull: Normal. Negative for fracture or focal lesion. Sinuses/Orbits: Significant mucoperiosteal thickening of the right maxillary sinus. Status post resection of medial walls of maxillary sinuses bilaterally. Other: None IMPRESSION: 1.  No evidence for acute intracranial abnormality. 2. Atrophy and small vessel disease. 3. Sinus disease. Electronically Signed   By: Norva Pavlov M.D.   On: 04/15/2016 21:42        Scheduled  Meds: . amLODipine  2.5 mg Oral Daily  . apixaban  2.5 mg Oral BID  . atorvastatin  20 mg Oral Daily  . calcium carbonate  1 tablet Oral Daily  . donepezil  10 mg Oral QHS  . LORazepam  0.5 mg Oral BID  . memantine  10 mg Oral BID  . mirtazapine  30 mg Oral QHS  . multivitamin with minerals  1 tablet Oral Daily  . sertraline  100 mg Oral Daily  . sodium chloride flush  3 mL Intravenous Q12H   Continuous Infusions:   LOS: 0 days    Time spent:35 min    Heyden Jaber U Brittnae Aschenbrenner, DO [facility] Pager [phone]  If 7PM-7AM, please contact night-coverage [email or web address] Password [email or web address] 04/16/2016, 11:52 AM

## 2016-04-16 NOTE — Progress Notes (Signed)
New Admission Note:   Arrival Method: From ED via Stretcher Mental Orientation: AOX4 Telemetry: Box [id number] Assessment: Completed Skin: Intact IV: R wrist Pain: no c/o of pain Tubes: None Safety Measures: Safety Fall Prevention Plan has been discussed  Admission:  [facility] [place] Orientation: Patient has been orientated to the room, unit and staff.  Family: none present at bedside  Orders to be reviewed and implemented. Will continue to monitor the patient. Call light has been placed within reach and bed alarm has been activated. Tele applied CCMD notified.    Gregor HamsAlisha Philis Doke, RN

## 2016-04-16 NOTE — Progress Notes (Signed)
EEG Completed; Results Pending  

## 2016-04-16 NOTE — Consult Note (Signed)
Consultation Note Date: 04/16/2016   Patient Name: Rebecca [name]  DOB: 09/04/1925  MRN: [id number]  Age / Sex: 82 y.o., female  PCP: Lawerance Cruel, MD Referring Physician: Geradine Girt, DO  Reason for Consultation: Establishing goals of care and Non pain symptom management  HPI/Patient Profile: 82 y.o. female  with past medical history of Lewy-Body dementia, A-fib (on eliquis), s/p TAVR (2015), admitted on 04/15/2016 with syncopal episode. Workup reveals dehydration, depression, possible seizure (negative EEG, MRI pending). Palliative medicine consulted at the request of family for [place] and assistance with depression and dementia symptoms.   Clinical Assessment and Goals of Care: Met with patient's daughter, Stanton Kidney. Patient lives at home with Stanton Kidney in an apartment in their downstairs. When Stanton Kidney is at work patient goes to an adult daycare. Prior to admission patient was ambulatory and able to complete ADL's on her own with prompting. She required help preparing food and picking out her clothes but is able to feed, dress and bathe herself. Daughter notes significant decline over the last year. Patient has had declining appetite and is sleeping more. She does, however, enjoy going to the adult daycare, spending time with [name]'s dog and had a good time this past Saturday evening at a party that [place] threw in their home. Stanton Kidney feels that her mother has had increasing depression since patient's husband died. She does note that at times her mother will say she is ready to die and be with her husband. She will sometimes find her Mom sitting in a room by herself quietly and she will tell Stanton Kidney that she's just sad and tired. Ms. Brazel has had episodes of mixing up her days and nights. These moments are different from the episodes that she describes where her mother will suddenly start staring off into space and not respond with  any type of reflex to a hand waving in front of her face. She notes this will happen for a few minutes and then her Mom will "come to" with rapid blinking and not remember the preceding minutes.  I discussed Palliative vs Hospice services and concepts with [facility]. Stanton Kidney is familiar with both and she received both services for her father who died of cancer approx 1.5 yrs ago. She is aware that her Mom may not be eligible for Hospice services yet (but this may change as this admission progresses and further workup is evaluated). [name]'s goals are for her Mom to stabilize and be able to return to her home. We discussed the trajectory of Lewy-Body dementia, the caregiver burden, and Stanton Kidney is prepared that at some point her Mom will no longer be able to be safe in their home. Noted that her increased sleeping, increased sadness and decreased appetite may be progressing dementia, or depression. Stanton Kidney has contacted patient's psychiatrist about this but has not heard back. Stanton Kidney is interested in titrating patient's Remeron in hopes of improving this a bit. Stanton Kidney is also interested in community palliative care at home. Stanton Kidney has discussed advanced directives with her Mom  and has completed paperwork. Patient desires DNR and would never want a feeding tube. I gave Stanton Kidney a MOST form to review and offered to complete it with her but she declined at this time.           Primary Decision Maker NEXT OF KIN Juliann Pulse- daughter    SUMMARY OF RECOMMENDATIONS -Increase mirtazapine to '45mg'$  po daily for appetite, depression, and sleep-wake cycle interruption -Recommend adding low dose Ritalin '5mg'$  po daily for somnolence, however, will hold off until neuro and cardio workup is complete -Referral made for community palliative followup    Code Status/Advance Care Planning:  DNR   Palliative Prophylaxis:   Delirium Protocol and Frequent Pain Assessment  Additional Recommendations (Limitations, Scope, Preferences):  No Artificial  Feeding  Psycho-social/Spiritual:   Desire for further Chaplaincy support:No  Additional Recommendations: Caregiving  Support/Resources  Prognosis:    Unable to determine  Discharge Planning: Home with Palliative Services  Primary Diagnoses: Present on Admission: . Near syncope . Mild dementia . Confusion . (Resolved) Pre-syncope . Essential hypertension   I have reviewed the medical record, interviewed the patient and family, and examined the patient. The following aspects are pertinent.  Past Medical History:  Diagnosis Date  . Age-related macular degeneration, wet, right eye ([place])   . Aortic stenosis   . Dementia    "slight"/daughter 12/02/2013  . Depression   . Heart murmur    "slight"  . High cholesterol   . Hypertension   . Panic attacks   . Paroxysmal atrial fibrillation ([facility])   . S/P TAVR (transcatheter aortic valve replacement) 02/02/2014   23 mm Edwards Sapien XT transcatheter heart valve placed via open right transfemoral approach  . Syncope 08/21/2012  . UTI (lower urinary tract infection)    Social History   Social History  . Marital status: Married    Spouse name: [name]  . Number of children: 3  . Years of education: 11th   Occupational History  . Retired    Social History Main Topics  . Smoking status: Former Smoker    Packs/day: 0.40    Years: 30.00    Types: Cigarettes    Quit date: 04/30/1960  . Smokeless tobacco: Never Used  . Alcohol use No  . Drug use: No  . Sexual activity: Not Asked   Other Topics Concern  . None   Social History Narrative   Pt lives at home with her daughter.   Married 70 yrs.   Caffeine Use: 2-3 cups daily   Family History  Problem Relation Age of Onset  . Heart attack Mother 58  . Heart Problems Father   . Heart attack Father 24  . Cerebral aneurysm Sister 29  . Sudden death Brother 22   Scheduled Meds: . amLODipine  2.5 mg Oral Daily  . apixaban  2.5 mg Oral BID  . atorvastatin  20 mg Oral  Daily  . calcium carbonate  1 tablet Oral Daily  . divalproex  500 mg Oral Q12H  . donepezil  10 mg Oral QHS  . feeding supplement (ENSURE ENLIVE)  237 mL Oral BID BM  . LORazepam  0.5 mg Oral BID  . memantine  10 mg Oral BID  . mirtazapine  45 mg Oral QHS  . multivitamin with minerals  1 tablet Oral Daily  . sertraline  100 mg Oral Daily  . sodium chloride flush  3 mL Intravenous Q12H   Continuous Infusions: PRN Meds:. Medications Prior to Admission:  Prior  to Admission medications   Medication Sig Start Date End Date Taking? Authorizing Provider  amLODipine (NORVASC) 2.5 MG tablet Take 1 tablet (2.5 mg total) by mouth daily. 12/26/15  Yes [name] Martinique, MD  atorvastatin (LIPITOR) 20 MG tablet Take 20 mg by mouth daily.   Yes Historical Provider, MD  Besifloxacin HCl (BESIVANCE) 0.6 % SUSP Place 1 drop into the right eye 3 (three) times daily. Only use for 3 days after eye injections. Gets injection every 3 months.   Yes Historical Provider, MD  calcium citrate (CALCITRATE - DOSED IN MG ELEMENTAL CALCIUM) 950 MG tablet Take 1 tablet by mouth daily.   Yes Historical Provider, MD  donepezil (ARICEPT) 10 MG tablet Take 1 tablet (10 mg total) by mouth at bedtime. 01/09/16  Yes Vikram R Penumalli, MD  ELIQUIS 2.5 MG TABS tablet TAKE 1 TABLET (2.5 MG TOTAL) BY MOUTH 2 (TWO) TIMES DAILY. 02/06/16  Yes [name] Martinique, MD  LORazepam (ATIVAN) 0.5 MG tablet Take 1 tablet by mouth 2 (two) times daily. 07/12/15  Yes Historical Provider, MD  memantine (NAMENDA) 10 MG tablet Take 1 tablet (10 mg total) by mouth 2 (two) times daily. 01/09/16  Yes Penni Bombard, MD  mirtazapine (REMERON) 30 MG tablet Take 30 mg by mouth at bedtime.   Yes Historical Provider, MD  Multiple Vitamin (MULTIVITAMIN) capsule Take 1 capsule by mouth daily.   Yes Historical Provider, MD  PRESCRIPTION MEDICATION Place into the right eye every 3 (three) months. Eye injection at eye dr's office   Yes Historical Provider, MD    sertraline (ZOLOFT) 100 MG tablet Take 1 tablet by mouth every morning.  07/20/15  Yes Historical Provider, MD   No Known Allergies Review of Systems  Unable to perform ROS: Other    Physical Exam  Constitutional: She appears well-developed and well-nourished.  Cardiovascular: Normal rate and regular rhythm.   Pulmonary/Chest: Effort normal and breath sounds normal.  Abdominal: Soft. Bowel sounds are normal.  Neurological:  sleeping  Skin: Skin is warm and dry.    Vital Signs: BP (!) 126/58 (BP Location: Left Arm)   Pulse 97   Temp 98.4 F (36.9 C) (Oral)   Resp 17   Ht _0  (1.6 m)   Wt 60 kg (132 lb 4.4 oz)   SpO2 97%   BMI 23.43 kg/m  Pain Assessment: No/denies pain   Pain Score: 0-No pain   SpO2: SpO2: 97 % O2 Device:SpO2: 97 % O2 Flow Rate: .   IO: Intake/output summary:  Intake/Output Summary (Last 24 hours) at 04/16/16 1541 Last data filed at 04/16/16 0009  Gross per 24 hour  Intake             1000 ml  Output                0 ml  Net             1000 ml    LBM: Last BM Date: 04/13/16 Baseline Weight: Weight: 59.9 kg (132 lb) Most recent weight: Weight: 60 kg (132 lb 4.4 oz)     Palliative Assessment/Data: PPS: 50%     Thank you for this consult. Palliative medicine will continue to follow and assist as needed.   Time In: 1300 Time Out: 1430 Time Total: 90 minutes Greater than 50%  of this time was spent counseling and coordinating care related to the above assessment and plan.  Signed by: Mariana Kaufman, AGNP-C Palliative Medicine  Please contact Palliative Medicine Team phone at [phone] for questions and concerns.  For individual provider: See Shea Evans

## 2016-04-16 NOTE — Consult Note (Signed)
NEURO HOSPITALIST CONSULT NOTE   Requestig physician: Dr. Benjamine MolaVann   Reason for Consult:New onset seizures   History obtained from:  Patient     HPI:                                                                                                                                          Rebecca RoyaltyJennie [name] is an 82 y.o. female with a history of syncope status post transient catheter aortic valve replacement, paroxysmal A. fib, panic attacks, hypertension, hypercholesterolemia, dementia. Patient is currently on Eliquis for her A. fib. Patient was brought into the hospital after patient had a near syncopal episode in the shower. Daughter noted that she was slightly altered after which was different from her baseline. On arrival to the ED patient's potassium was 3.3, chloride 100, creatinine 1.28. Patient's urinalysis did not show any leukocytes or nitrates. Currently she is awaiting MRI of the brain. Patient has had EEG but awaiting formal reading.  After calling the patient's daughter further information was gathered. The daughter states that she just left the room when her mother had gotten out of the shower and then she heard a thud. Her mother was on the floor however would not respond to her although she was staring straight Ater. EMS was called and upon arrival and appeared that patient was coming around and had some eye fluttering. Per daughter to about 10 minutes for this whole episode to occur before she is back to her normal baseline. Patient does not recall any of this.  Daughter also notes that over the past year or so her mother has been very depressed about the passing of her husband. There've been times when she is sitting and having a conversation will suddenly have a blank stare with eye fluttering. At times he will avoid her hands are 5 her face and she does not show any reaction. Patient even admits that she's had periods in which she does not recall time passing. Currently  she is sitting upright eating her lunch.  Past Medical History:  Diagnosis Date  . Age-related macular degeneration, wet, right eye (HCC)   . Aortic stenosis   . Dementia    "slight"/daughter 12/02/2013  . Depression   . Heart murmur    "slight"  . High cholesterol   . Hypertension   . Panic attacks   . Paroxysmal atrial fibrillation (HCC)   . S/P TAVR (transcatheter aortic valve replacement) 02/02/2014   23 mm Edwards Sapien XT transcatheter heart valve placed via open right transfemoral approach  . Syncope 08/21/2012  . UTI (lower urinary tract infection)     Past Surgical History:  Procedure Laterality Date  . CARDIAC CATHETERIZATION  04/2013  . EYE SURGERY     cataracts  . INTRAOPERATIVE  TRANSESOPHAGEAL ECHOCARDIOGRAM N/A 02/02/2014   Procedure: INTRAOPERATIVE TRANSESOPHAGEAL ECHOCARDIOGRAM;  Surgeon: Tonny Bollman, MD;  Location: [facility] OR;  Service: Open Heart Surgery;  Laterality: N/A;  . LEFT AND RIGHT HEART CATHETERIZATION WITH CORONARY ANGIOGRAM N/A 05/25/2013   Procedure: LEFT AND RIGHT HEART CATHETERIZATION WITH CORONARY ANGIOGRAM;  Surgeon: [name] [name], MD;  Location: [facility] CATH LAB;  Service: Cardiovascular;  Laterality: N/A;  . TONSILLECTOMY    . TRANSCATHETER AORTIC VALVE REPLACEMENT, TRANSFEMORAL  02/02/2014   Edwards Sapien XT THV (size 23 mm, model # 9300TFX, serial # [id number])  . TRANSCATHETER AORTIC VALVE REPLACEMENT, TRANSFEMORAL N/A 02/02/2014   Procedure: TRANSCATHETER AORTIC VALVE REPLACEMENT, TRANSFEMORAL;  Surgeon: Tonny Bollman, MD;  Location: [facility] OR;  Service: Open Heart Surgery;  Laterality: N/A;    Family History  Problem Relation Age of Onset  . Heart attack Mother 45  . Heart Problems Father   . Heart attack Father 1  . Cerebral aneurysm Sister 18  . Sudden death Brother 36     Social History:  reports that she quit smoking about 56 years ago. Her smoking use included Cigarettes. She has a 12.00 pack-year smoking history. She has never used  smokeless tobacco. She reports that she does not drink alcohol or use drugs.  No Known Allergies  MEDICATIONS:                                                                                                                     Prior to Admission:  Prescriptions Prior to Admission  Medication Sig Dispense Refill Last Dose  . amLODipine (NORVASC) 2.5 MG tablet Take 1 tablet (2.5 mg total) by mouth daily. 90 tablet 3 04/15/2016 at 0900  . atorvastatin (LIPITOR) 20 MG tablet Take 20 mg by mouth daily.   04/15/2016 at am  . Besifloxacin HCl (BESIVANCE) 0.6 % SUSP Place 1 drop into the right eye 3 (three) times daily. Only use for 3 days after eye injections. Gets injection every 3 months.   04/05/2016 at pm  . calcium citrate (CALCITRATE - DOSED IN MG ELEMENTAL CALCIUM) 950 MG tablet Take 1 tablet by mouth daily.   04/15/2016 at am  . donepezil (ARICEPT) 10 MG tablet Take 1 tablet (10 mg total) by mouth at bedtime. 90 tablet 4 04/14/2016 at pm  . ELIQUIS 2.5 MG TABS tablet TAKE 1 TABLET (2.5 MG TOTAL) BY MOUTH 2 (TWO) TIMES DAILY. 60 tablet 5 04/15/2016 at 1700  . LORazepam (ATIVAN) 0.5 MG tablet Take 1 tablet by mouth 2 (two) times daily.   04/15/2016 at pm  . memantine (NAMENDA) 10 MG tablet Take 1 tablet (10 mg total) by mouth 2 (two) times daily. 180 tablet 4 04/15/2016 at pm  . mirtazapine (REMERON) 30 MG tablet Take 30 mg by mouth at bedtime.   04/14/2016 at pm  . Multiple Vitamin (MULTIVITAMIN) capsule Take 1 capsule by mouth daily.   04/15/2016 at am  . PRESCRIPTION MEDICATION Place into the right eye every 3 (three) months. Eye  injection at eye dr's office   04/05/2016 at am  . sertraline (ZOLOFT) 100 MG tablet Take 1 tablet by mouth every morning.   5 04/15/2016 at am   Scheduled: . amLODipine  2.5 mg Oral Daily  . apixaban  2.5 mg Oral BID  . atorvastatin  20 mg Oral Daily  . calcium carbonate  1 tablet Oral Daily  . donepezil  10 mg Oral QHS  . LORazepam  0.5 mg Oral BID  . memantine   10 mg Oral BID  . mirtazapine  30 mg Oral QHS  . multivitamin with minerals  1 tablet Oral Daily  . sertraline  100 mg Oral Daily  . sodium chloride flush  3 mL Intravenous Q12H     ROS:                                                                                                                                       History obtained from the patient  General ROS: negative for - chills, fatigue, fever, night sweats, weight gain or weight loss Psychological ROS: negative for - behavioral disorder, hallucinations, memory difficulties, mood swings or suicidal ideation Ophthalmic ROS: negative for - blurry vision, double vision, eye pain or loss of vision ENT ROS: negative for - epistaxis, nasal discharge, oral lesions, sore throat, tinnitus or vertigo Allergy and Immunology ROS: negative for - hives or itchy/watery eyes Hematological and Lymphatic ROS: negative for - bleeding problems, bruising or swollen lymph nodes Endocrine ROS: negative for - galactorrhea, hair pattern changes, polydipsia/polyuria or temperature intolerance Respiratory ROS: negative for - cough, hemoptysis, shortness of breath or wheezing Cardiovascular ROS: negative for - chest pain, dyspnea on exertion, edema or irregular heartbeat Gastrointestinal ROS: negative for - abdominal pain, diarrhea, hematemesis, nausea/vomiting or stool incontinence Genito-Urinary ROS: negative for - dysuria, hematuria, incontinence or urinary frequency/urgency Musculoskeletal ROS: negative for - joint swelling or muscular weakness Neurological ROS: as noted in HPI Dermatological ROS: negative for rash and skin lesion changes   Blood pressure (!) 126/56, pulse 78, temperature 98.8 F (37.1 C), temperature source Oral, resp. rate 20, height 5\' 3"  (1.6 m), weight 60 kg (132 lb 4.4 oz), SpO2 97 %.   Neurologic Examination:                                                                                                      HEENT-   Normocephalic, no lesions, without obvious abnormality.  Normal external eye and conjunctiva.  Normal  TM's bilaterally.  Normal auditory canals and external ears. Normal external nose, mucus membranes and septum.  Normal pharynx. Cardiovascular- S1, S2 normal, pulses palpable throughout   Lungs- chest clear, no wheezing, rales, normal symmetric air entry Abdomen- normal findings: bowel sounds normal Extremities- no edema Lymph-no adenopathy palpable Musculoskeletal-no joint tenderness, deformity or swelling Skin-warm and dry, no hyperpigmentation, vitiligo, or suspicious lesions  Neurological Examination Mental Status: Alert, oriented to [facility], the years 2017, present in his trunk.  She has some difficulty with calculations.  Speech fluent without evidence of aphasia.  Able to follow simple commands without difficulty. Cranial Nerves: II:  Visual fields grossly normal, pupils equal, round, reactive to light and accommodation III,IV, VI: ptosis not present, extra-ocular motions intact bilaterally V,VII: smile symmetric, facial light touch sensation normal bilaterally VIII: hearing normal bilaterally IX,X: uvula rises symmetrically XI: bilateral shoulder shrug XII: midline tongue extension Motor: Patient has 4/5 strength throughout. Patient has bilateral postural tremor with her right arm being greater than her left. She also has a tremor that incorporates her jaw and neck. Daughter states this is her  baseline Sensory: Pinprick and light touch intact throughout, bilaterally Deep Tendon Reflexes: 2+ and symmetric throughout Plantars: Right: downgoing   Left: downgoing Cerebellar: normal finger-to-nose,  and normal heel-to-shin test Gait: Not tested      Lab Results: Basic Metabolic Panel:  Recent Labs Lab 04/15/16 2150 04/16/16 0804  NA 138 142  K 3.3* 3.7  CL 100* 107  CO2 30 26  GLUCOSE 106* 91  BUN 21* 16  CREATININE 1.28* 1.01*  CALCIUM 9.0 8.7*     Liver Function Tests: No results for input(s): AST, ALT, ALKPHOS, BILITOT, PROT, ALBUMIN in the last 168 hours. No results for input(s): LIPASE, AMYLASE in the last 168 hours. No results for input(s): AMMONIA in the last 168 hours.  CBC:  Recent Labs Lab 04/15/16 2150  WBC 7.6  NEUTROABS 4.9  HGB 13.9  HCT 42.3  MCV 86.7  PLT 168    Cardiac Enzymes: No results for input(s): CKTOTAL, CKMB, CKMBINDEX, TROPONINI in the last 168 hours.  Lipid Panel: No results for input(s): CHOL, TRIG, HDL, CHOLHDL, VLDL, LDLCALC in the last 168 hours.  CBG:  Recent Labs Lab 04/16/16 0724 04/16/16 1017  GLUCAP 96 95    Microbiology: Results for orders placed or performed during the hospital encounter of 01/29/14  Surgical pcr screen     Status: None   Collection Time: 01/29/14  3:03 PM  Result Value Ref Range Status   MRSA, PCR NEGATIVE NEGATIVE Final   Staphylococcus aureus NEGATIVE NEGATIVE Final    Comment:        The Xpert SA Assay (FDA approved for NASAL specimens in patients over 80 years of age), is one component of a comprehensive surveillance program.  Test performance has been validated by [facility] for patients greater than or equal to 80 year old. It is not intended to diagnose infection nor to guide or monitor treatment.    Coagulation Studies: No results for input(s): LABPROT, INR in the last 72 hours.  Imaging: Dg Chest 1 View  Result Date: 04/15/2016 CLINICAL DATA:  Syncopal episode EXAM: CHEST 1 VIEW COMPARISON:  07/05/2015 and 04/11/2015 radiographs. FINDINGS: 1954 hour. Stable mild cardiac enlargement and aortic atherosclerosis status post TAVR. The lungs are clear. There is no pleural effusion. Probable skin fold overlies the lower right chest laterally. No evidence of pneumothorax. The bones appear unchanged. IMPRESSION: Stable chest.  No acute cardiopulmonary process. Electronically Signed   By: Carey Bullocks M.D.   On: 04/15/2016 20:31   Dg  Lumbar Spine Complete  Result Date: 04/15/2016 CLINICAL DATA:  Near syncope while showering. Pt denies LOC. Pt states she is feeling more tired and has lower back pain. Lower back pain x 20 years. EXAM: LUMBAR SPINE - COMPLETE 4+ VIEW COMPARISON:  06/03/2014 CT FINDINGS: Mild degenerative changes are identified throughout the lumbar spine. There is increased disc height loss and sclerosis at L1-2. Question of retrolisthesis of L1 on L2 versus retropulsion of fragments. Question of discitis at this level. There is anterolisthesis of L4 on L5 by 4 mm. Dense atherosclerotic calcification of the abdominal aorta. Within the left hemipelvis there are large coarse calcifications consistent with a dermoid. IMPRESSION: 1. Question of discitis versus significant progression of degenerative changes at L1-2. 2. Consider further imaging evaluation.  MRI is recommended. 3. Left pelvic dermoid. Electronically Signed   By: Norva Pavlov M.D.   On: 04/15/2016 20:35   Ct Head Wo Contrast  Result Date: 04/15/2016 CLINICAL DATA:  LETHARGY WITH SYNCOPE near syncope while showering. Pt denies LOC. Pt states she is feeling more tired and has lower back pain PMH: HTN, High cholesterol; UTI (lower urinary tract infection); Aortic stenosis; syncope. EXAM: CT HEAD WITHOUT CONTRAST TECHNIQUE: Contiguous axial images were obtained from the base of the skull through the vertex without intravenous contrast. COMPARISON:  12/02/2013 FINDINGS: Brain: There is mild central and cortical atrophy. Periventricular white matter changes are consistent with small vessel disease. There is no intra or extra-axial fluid collection or mass lesion. The basilar cisterns and ventricles have a normal appearance. There is no CT evidence for acute infarction or hemorrhage. Vascular: There is atherosclerotic calcification of the carotid siphonsand vertebral arteries. Skull: Normal. Negative for fracture or focal lesion. Sinuses/Orbits: Significant  mucoperiosteal thickening of the right maxillary sinus. Status post resection of medial walls of maxillary sinuses bilaterally. Other: None IMPRESSION: 1.  No evidence for acute intracranial abnormality. 2. Atrophy and small vessel disease. 3. Sinus disease. Electronically Signed   By: Norva Pavlov M.D.   On: 04/15/2016 21:42    EEG:  IMPRESSION:  This is a normal EEG for the patients stated age.  There were no focal, hemispheric or lateralizing features.  No epileptiform activity was recorded.  A normal EEG does not exclude the diagnosis of a seizure disorder and if seizure remains high on the list of differential diagnosis, an ambulatory EEG may be of value.  Clinical correlation is required.  Assessment and plan per attending neurologist  Felicie Morn PA-C [facility] [phone]  04/16/2016, 11:25 AM   Assessment/Plan:  82 yo F with recurrent staring spells concerning for partial seizures. I think that with the description of the recurrent stereotyped events, I would favor starting AED therapy at this time.   1) Depakote 500mg  BID.  2) f/u EEG, MRI  Ritta Slot, MD [facility] [phone]  If 7pm- 7am, please page neurology on call as listed in AMION.

## 2016-04-16 NOTE — ED Notes (Signed)
Report given to Reed CreekAlicia on 2W

## 2016-04-16 NOTE — Progress Notes (Signed)
PT Cancellation Note  Patient Details Name: Rebecca [name] MRN: [id number] DOB: 1925/10/04   Cancelled Treatment:    Reason Eval/Treat Not Completed: Patient at procedure or test/unavailable (pt OOR for EEG)   Linsie Lupo B Leannah Guse 04/16/2016, 11:38 AM  Delaney MeigsMaija Tabor Lorijean Husser, PT [phone]

## 2016-04-16 NOTE — Procedures (Signed)
HPI:  82 y/o with near syncope  TECHNICAL SUMMARY:  A multichannel referential and bipolar montage EEG using the standard international 10-20 system was performed on the patient described as awake.  The dominant background activity consists of 9.5 hertz activity seen most prominantly over the posterior head region.  The backgound activity is reactive to eye opening and closing procedures.  Low voltage fast (beta) activity is distributed symmetrically and maximally over the anterior head regions.  ACTIVATION:  Stepwise photic stimulation and hyperventilation were not performed  EPILEPTIFORM ACTIVITY:  There were no spikes, sharp waves or paroxysmal activity.  SLEEP:  No sleep  CARDIAC:  The EKG lead was not well recorded  IMPRESSION:  This is a normal EEG for the patients stated age.  There were no focal, hemispheric or lateralizing features.  No epileptiform activity was recorded.  A normal EEG does not exclude the diagnosis of a seizure disorder and if seizure remains high on the list of differential diagnosis, an ambulatory EEG may be of value.  Clinical correlation is required.

## 2016-04-17 ENCOUNTER — Inpatient Hospital Stay (HOSPITAL_COMMUNITY): Payer: Medicare Other

## 2016-04-17 DIAGNOSIS — F331 Major depressive disorder, recurrent, moderate: Secondary | ICD-10-CM

## 2016-04-17 DIAGNOSIS — I350 Nonrheumatic aortic (valve) stenosis: Secondary | ICD-10-CM

## 2016-04-17 DIAGNOSIS — Z952 Presence of prosthetic heart valve: Secondary | ICD-10-CM

## 2016-04-17 DIAGNOSIS — I441 Atrioventricular block, second degree: Secondary | ICD-10-CM

## 2016-04-17 DIAGNOSIS — R400 Somnolence: Secondary | ICD-10-CM

## 2016-04-17 DIAGNOSIS — Z515 Encounter for palliative care: Secondary | ICD-10-CM

## 2016-04-17 DIAGNOSIS — R630 Anorexia: Secondary | ICD-10-CM

## 2016-04-17 DIAGNOSIS — I442 Atrioventricular block, complete: Secondary | ICD-10-CM

## 2016-04-17 DIAGNOSIS — G4720 Circadian rhythm sleep disorder, unspecified type: Secondary | ICD-10-CM

## 2016-04-17 DIAGNOSIS — Z7189 Other specified counseling: Secondary | ICD-10-CM

## 2016-04-17 LAB — GLUCOSE, CAPILLARY: GLUCOSE-CAPILLARY: 95 mg/dL (ref 65–99)

## 2016-04-17 NOTE — Consult Note (Addendum)
ELECTROPHYSIOLOGY CONSULT NOTE    Primary Care Physician: Duane LopeAlan Ross, MD Referring Physician:  Dr Allyson SabalBerry  Admit Date: 04/15/2016  Reason for consultation:  Transient AV block  Rebecca [name] is a 82 y.o. female with a h/o advanced dementia, depression, and recent clinical decline who is admitted with syncope and altered conciousness.  She is followed by D[facility] SwazilandJordan and [facility] who both document clear decline over the past few months.  The patients daughter notes that she has also been less engaged.  She did have syncope in 2014 which was attributed to severe AS for which she had TAVR performed without additional syncope.   She was admitted 04/15/16 after having near syncope in the shower.  Her daughter reports that she was getting out of the shower when she had unwitnessed collapse.  The patient was found on the floor and was confused.  It took her about 10 minutes to return to baseline. The patients daughter reports that over the past year, she has had blank stares with eye fluttering lasting approximately 10 minutes.  She has ongoing neurology workup for this. Last evening, she had transient AV block without symptoms.  The majority of her daytime telemetry has been normal.  She has had asymptomatic nonsustained atach also.  Presently, the patient is sleeping but rouses.  She is currently without chest pain or SOB and is otherwise without complaint today.   Past Medical History:  Diagnosis Date  . Age-related macular degeneration, wet, right eye (HCC)   . Aortic stenosis   . Dementia    "slight"/daughter 12/02/2013  . Depression   . Heart murmur    "slight"  . High cholesterol   . Hypertension   . Panic attacks   . Paroxysmal atrial fibrillation (HCC)   . S/P TAVR (transcatheter aortic valve replacement) 02/02/2014   23 mm Edwards Sapien XT transcatheter heart valve placed via open right transfemoral approach  . Syncope 08/21/2012  . UTI (lower urinary tract infection)    Past Surgical  History:  Procedure Laterality Date  . CARDIAC CATHETERIZATION  04/2013  . EYE SURGERY     cataracts  . INTRAOPERATIVE TRANSESOPHAGEAL ECHOCARDIOGRAM N/A 02/02/2014   Procedure: INTRAOPERATIVE TRANSESOPHAGEAL ECHOCARDIOGRAM;  Surgeon: Tonny BollmanMichael Cooper, MD;  [facility];  Service: Open Heart Surgery;  Laterality: N/A;  . LEFT AND RIGHT HEART CATHETERIZATION WITH CORONARY ANGIOGRAM N/A 05/25/2013   Procedure: LEFT AND RIGHT HEART CATHETERIZATION WITH CORONARY ANGIOGRAM;  Surgeon: [name] SwazilandJordan, MD;  [facility];  Service: Cardiovascular;  Laterality: N/A;  . TONSILLECTOMY    . TRANSCATHETER AORTIC VALVE REPLACEMENT, TRANSFEMORAL  02/02/2014   Edwards Sapien XT THV (size 23 mm, model # 9300TFX, serial # [id number])  . TRANSCATHETER AORTIC VALVE REPLACEMENT, TRANSFEMORAL N/A 02/02/2014   Procedure: TRANSCATHETER AORTIC VALVE REPLACEMENT, TRANSFEMORAL;  Surgeon: Tonny BollmanMichael Cooper, MD;  [facility];  Service: Open Heart Surgery;  Laterality: N/A;    . amLODipine  2.5 mg Oral Daily  . apixaban  2.5 mg Oral BID  . atorvastatin  20 mg Oral Daily  . calcium carbonate  1 tablet Oral Daily  . divalproex  500 mg Oral Q12H  . donepezil  10 mg Oral QHS  . feeding supplement (ENSURE ENLIVE)  237 mL Oral BID BM  . LORazepam  0.5 mg Oral BID  . memantine  10 mg Oral BID  . mirtazapine  45 mg Oral QHS  . multivitamin with minerals  1 tablet Oral Daily  . sertraline  100  mg Oral Daily  . sodium chloride flush  3 mL Intravenous Q12H   . sodium chloride 75 mL/hr at 04/16/16 1615    No Known Allergies  Social History   Social History  . Marital status: Married    Spouse name: Rebecca [name]  . Number of children: 3  . Years of education: 11th   Occupational History  . Retired    Social History Main Topics  . Smoking status: Former Smoker    Packs/day: 0.40    Years: 30.00    Types: Cigarettes    Quit date: 04/30/1960  . Smokeless tobacco: Never Used  . Alcohol use No  . Drug use: No  .  Sexual activity: Not on file   Other Topics Concern  . Not on file   Social History Narrative   Pt lives at home with her daughter.   Married 65 yrs.   Caffeine Use: 2-3 cups daily    Family History  Problem Relation Age of Onset  . Heart attack Mother 44  . Heart Problems Father   . Heart attack Father 31  . Cerebral aneurysm Sister 79  . Sudden death Brother 49    ROS- pt unable to provide  Physical Exam: Telemetry: as above Vitals:   04/17/16 0619 04/17/16 1027 04/17/16 1207 04/17/16 2120  BP: (!) 147/73 (!) 186/74 (!) 191/77 (!) 160/72  Pulse: 85  97 88  Resp: 18  18 18   Temp: 97.4 F (36.3 C)  98.1 F (36.7 C) 98.4 F (36.9 C)  TempSrc: Oral  Oral Oral  SpO2: 95%  96% 96%  Weight: 128 lb 1.6 oz (58.1 kg)     Height:        GEN- The patient is elderly and frail appearing, sleeping but rouses Head- normocephalic, atraumatic Eyes-  Sclera clear, conjunctiva pink Ears- hearing intact Oropharynx- clear Neck- supple,   Lungs- Clear to ausculation bilaterally, normal work of breathing Heart- RRR GI- soft, NT, ND, + BS Extremities- no clubbing, cyanosis, or edema MS- diffuse atrophy Skin- no rash or lesion Psych- confused Neuro- strength and sensation are intact  EKG- reviewed  Labs:   Lab Results  Component Value Date   WBC 7.6 04/15/2016   HGB 13.9 04/15/2016   HCT 42.3 04/15/2016   MCV 86.7 04/15/2016   PLT 168 04/15/2016    Recent Labs Lab 04/16/16 0804  NA 142  K 3.7  CL 107  CO2 26  BUN 16  CREATININE 1.01*  CALCIUM 8.7*  GLUCOSE 91   Lab Results  Component Value Date   TROPONINI <0.30 12/03/2013   No results found for: CHOL No results found for: HDL No results found for: LDLCALC No results found for: TRIG No results found for: CHOLHDL No results found for: LDLDIRECT     Echo:  reviewed  ASSESSMENT AND PLAN:   1.  Altered level of consciousness/ possible syncope I am not certain that her unwitnessed fall was due to an  arrhythmia.  Her prolonged cognative recovery suggests otherwise.  She could have had a mechanical fall or neurologic event (seizure).  Her recurrent prolonged "staring spells" are not arrhythmogenic. She has had very transient AV block without associated symptoms.   2. Transient complete heart block She has age appropriate degenerative conduction system disease which is likely exacerbated by aricept.  I will therefore stop aricept at this time. Hopefully her transient AV block will resolve off of this medicine.   Avoid AV nodal agents going forward.  If she has recurrent daytime symptomatic AV block then additional EP evaluation could be considered at that time. The patient is clear that she would like to avoid pacing.  I have spoken with the patient's daughter by phone.  She recognizes the patients advanced age, fragility, and recent clinical decline and also prefers a conservative approach.  3. Nonsustained atach Asymptomatic, short, infrequent No treatment advised  4. Paroxysmal afib chads2vasc score is 4.  Given advanced age, fragility, and recent fall, she is probably a poor candidate for long term anticoagulation.  Will continue eliquis for now but defer long term decisions to [facility] [place] and [facility] who know the patient well.  Electrophysiology team to see as needed while here. Please call with questions.   Hillis RangeJames Dannisha Eckmann, MD 04/17/2016

## 2016-04-17 NOTE — Care Management Note (Signed)
Case Management Note Donn PieriniKristi Taz Vanness RN, BSN Unit 2W-Case Manager [phone]  Patient Details  Name: Knox RoyaltyJennie Geissinger MRN: [id number] Date of Birth: [birth year]  Subjective/Objective:   Pt admitted with near syncope and increased confusion                 Action/Plan: PTA pt lived at home with daughter- referral received for PC services in the home- spoke with pt's daughter mary at the bedside- per conversation daughter in unsure about PC services at this time- waiting to see what pt's plan of care will be - PTA per daughter pt was going to an adult day care during the day while daughter was at work- daughter would like to see if pt able to return there. Pt is [facility] eligible- daughter familiar with [facility]. Pt would eligible for [facility] referral with [facility] for PC services if daughter choices that route- list also provided for other options.  CM will f/u with daughter tomorrow regarding d/c plans and what daughter decides.   Expected Discharge Date:                  Expected Discharge Plan:  Home w Home Health Services  In-House Referral:     Discharge planning Services  CM Consult  Post Acute Care Choice:    Choice offered to:     DME Arranged:    DME Agency:     HH Arranged:    HH Agency:     Status of Service:  In process, will continue to follow  If discussed at Long Length of Stay Meetings, dates discussed:    Additional Comments:  Darrold SpanWebster, Jadien [place], RN 04/17/2016, 3:35 PM

## 2016-04-17 NOTE — Consult Note (Signed)
Cardiology Consult    Patient ID: Rebecca [name] MRN: [id number], DOB/AGE: 12/12/1925   Admit date: 04/15/2016 Date of Consult: 04/17/2016  Primary Physician: [name], MD Reason for Consult: CHB Primary Cardiologist: Dr. [name]  Requesting Provider: Dr. Benjamine Mola   Patient Profile    Rebecca [name] is a 82 year old female with a past medical history of PAF (on Eliquis), severe AS s/p TAVR, and severe depression following Rebecca husbands death a year and a half ago. She was admitted on 04/15/16 with syncope and altered mental status. Found to have periods of symptomatic bradycardia on telemetry with question of CHB, cardiology was consulted.   History of Present Illness    Rebecca [name] is followed by Dr. [name] for aortic stenosis and chronic diastolic CHF, she was last seen in November of this year and was doing fair, had some progression of Rebecca dementia and was requiring 24 hour care. She underwent TAVR on 01/2014 and a good result was achieved.   She had a syncopal episode in the shower on the morning of 04/15/16, prompting Rebecca Rebecca [name] to seek medical attention for Rebecca [name]. There was some question of seizure like activity as Rebecca Rebecca [name] reported that there will be periods of time, sometimes up to 10 minutes where the patient just stares blankly. She was started on Depakote and MRI of Rebecca brain showed no acute infarct.   Rebecca telemetry was reviewed and she has periods of sinus tachycardia with rates in the 120's -130's, and also an episode last night around 8:00 pm of complete heart block. That episode appears to be isolated and has not re-occurred since. The patient was sleeping at the time and denied any symptoms.   She denies chest pain, SOB and orthopnea.     Past Medical History   Past Medical History:  Diagnosis Date  . Age-related macular degeneration, wet, right eye (HCC)   . Aortic stenosis   . Dementia    "slight"/Rebecca [name] 12/02/2013  . Depression   . Heart murmur    "slight"    . High cholesterol   . Hypertension   . Panic attacks   . Paroxysmal atrial fibrillation (HCC)   . S/P TAVR (transcatheter aortic valve replacement) 02/02/2014   23 mm Edwards Sapien XT transcatheter heart valve placed via open right transfemoral approach  . Syncope 08/21/2012  . UTI (lower urinary tract infection)     Past Surgical History:  Procedure Laterality Date  . CARDIAC CATHETERIZATION  04/2013  . EYE SURGERY     cataracts  . INTRAOPERATIVE TRANSESOPHAGEAL ECHOCARDIOGRAM N/A 02/02/2014   Procedure: INTRAOPERATIVE TRANSESOPHAGEAL ECHOCARDIOGRAM;  Surgeon: Tonny Bollman, MD;  Location: [facility] OR;  Service: Open Heart Surgery;  Laterality: N/A;  . LEFT AND RIGHT HEART CATHETERIZATION WITH CORONARY ANGIOGRAM N/A 05/25/2013   Procedure: LEFT AND RIGHT HEART CATHETERIZATION WITH CORONARY ANGIOGRAM;  Surgeon: [name] [name], MD;  Location: [facility] CATH LAB;  Service: Cardiovascular;  Laterality: N/A;  . TONSILLECTOMY    . TRANSCATHETER AORTIC VALVE REPLACEMENT, TRANSFEMORAL  02/02/2014   Edwards Sapien XT THV (size 23 mm, model # 9300TFX, serial # [id number])  . TRANSCATHETER AORTIC VALVE REPLACEMENT, TRANSFEMORAL N/A 02/02/2014   Procedure: TRANSCATHETER AORTIC VALVE REPLACEMENT, TRANSFEMORAL;  Surgeon: Tonny Bollman, MD;  Location: [facility] OR;  Service: Open Heart Surgery;  Laterality: N/A;     Allergies  No Known Allergies  Inpatient Medications    . amLODipine  2.5 mg Oral Daily  . apixaban  2.5 mg Oral BID  .  atorvastatin  20 mg Oral Daily  . calcium carbonate  1 tablet Oral Daily  . divalproex  500 mg Oral Q12H  . donepezil  10 mg Oral QHS  . feeding supplement (ENSURE ENLIVE)  237 mL Oral BID BM  . LORazepam  0.5 mg Oral BID  . memantine  10 mg Oral BID  . mirtazapine  45 mg Oral QHS  . multivitamin with minerals  1 tablet Oral Daily  . sertraline  100 mg Oral Daily  . sodium chloride flush  3 mL Intravenous Q12H    Family History    Family History  Problem Relation Age of  Onset  . Heart attack Mother 6333  . Heart Problems Father   . Heart attack Father 7683  . Cerebral aneurysm Sister 8342  . Sudden death Brother 365    Social History    Social History   Social History  . Marital status: Married    Spouse name: [name]  . Number of children: 3  . Years of education: 11th   Occupational History  . Retired    Social History Main Topics  . Smoking status: Former Smoker    Packs/day: 0.40    Years: 30.00    Types: Cigarettes    Quit date: 04/30/1960  . Smokeless tobacco: Never Used  . Alcohol use No  . Drug use: No  . Sexual activity: Not on file   Other Topics Concern  . Not on file   Social History Narrative   Pt lives at home with Rebecca Rebecca [name].   Married 65 yrs.   Caffeine Use: 2-3 cups daily     Review of Systems    General:  No chills, fever, night sweats or weight changes.  Cardiovascular:  No chest pain, dyspnea on exertion, edema, orthopnea, palpitations, paroxysmal nocturnal dyspnea. Dermatological: No rash, lesions/masses Respiratory: No cough, dyspnea Urologic: No hematuria, dysuria Abdominal:   No nausea, vomiting, diarrhea, bright red blood per rectum, melena, or hematemesis Neurologic:  No visual changes, wkns, changes in mental status. All other systems reviewed and are otherwise negative except as noted above.  Physical Exam    Blood pressure (!) 191/77, pulse 97, temperature 98.1 F (36.7 C), temperature source Oral, resp. rate 18, height 5\' 3"  (1.6 m), weight 128 lb 1.6 oz (58.1 kg), SpO2 96 %.  General: Pleasant, elderly female in no distress Psych: Normal affect. Neuro: Alert and oriented X 3. Moves all extremities spontaneously. HEENT: Normal  Neck: Supple without bruits or JVD. Lungs:  Resp regular and unlabored, CTA. Heart: RRR no s3, s4. 2/6 systolic murmur.  Abdomen: Soft, non-tender, non-distended, BS + x 4.  Extremities: No clubbing, cyanosis or edema. DP/PT/Radials 2+ and equal bilaterally.  Labs      Lab Results  Component Value Date   WBC 7.6 04/15/2016   HGB 13.9 04/15/2016   HCT 42.3 04/15/2016   MCV 86.7 04/15/2016   PLT 168 04/15/2016    Recent Labs Lab 04/16/16 0804  NA 142  K 3.7  CL 107  CO2 26  BUN 16  CREATININE 1.01*  CALCIUM 8.7*  GLUCOSE 91      Radiology Studies    Dg Chest 1 View  Result Date: 04/15/2016 CLINICAL DATA:  Syncopal episode EXAM: CHEST 1 VIEW COMPARISON:  07/05/2015 and 04/11/2015 radiographs. FINDINGS: 1954 hour. Stable mild cardiac enlargement and aortic atherosclerosis status post TAVR. The lungs are clear. There is no pleural effusion. Probable skin fold overlies the lower right chest  laterally. No evidence of pneumothorax. The bones appear unchanged. IMPRESSION: Stable chest.  No acute cardiopulmonary process. Electronically Signed   By: Carey Bullocks M.D.   On: 04/15/2016 20:31   Dg Lumbar Spine Complete  Result Date: 04/15/2016 CLINICAL DATA:  Near syncope while showering. Pt denies LOC. Pt states she is feeling more tired and has lower back pain. Lower back pain x 20 years. EXAM: LUMBAR SPINE - COMPLETE 4+ VIEW COMPARISON:  06/03/2014 CT FINDINGS: Mild degenerative changes are identified throughout the lumbar spine. There is increased disc height loss and sclerosis at L1-2. Question of retrolisthesis of L1 on L2 versus retropulsion of fragments. Question of discitis at this level. There is anterolisthesis of L4 on L5 by 4 mm. Dense atherosclerotic calcification of the abdominal aorta. Within the left hemipelvis there are large coarse calcifications consistent with a dermoid. IMPRESSION: 1. Question of discitis versus significant progression of degenerative changes at L1-2. 2. Consider further imaging evaluation.  MRI is recommended. 3. Left pelvic dermoid. Electronically Signed   By: Norva Pavlov M.D.   On: 04/15/2016 20:35   Ct Head Wo Contrast  Result Date: 04/15/2016 CLINICAL DATA:  LETHARGY WITH SYNCOPE near syncope while  showering. Pt denies LOC. Pt states she is feeling more tired and has lower back pain PMH: HTN, High cholesterol; UTI (lower urinary tract infection); Aortic stenosis; syncope. EXAM: CT HEAD WITHOUT CONTRAST TECHNIQUE: Contiguous axial images were obtained from the base of the skull through the vertex without intravenous contrast. COMPARISON:  12/02/2013 FINDINGS: Brain: There is mild central and cortical atrophy. Periventricular white matter changes are consistent with small vessel disease. There is no intra or extra-axial fluid collection or mass lesion. The basilar cisterns and ventricles have a normal appearance. There is no CT evidence for acute infarction or hemorrhage. Vascular: There is atherosclerotic calcification of the carotid siphonsand vertebral arteries. Skull: Normal. Negative for fracture or focal lesion. Sinuses/Orbits: Significant mucoperiosteal thickening of the right maxillary sinus. Status post resection of medial walls of maxillary sinuses bilaterally. Other: None IMPRESSION: 1.  No evidence for acute intracranial abnormality. 2. Atrophy and small vessel disease. 3. Sinus disease. Electronically Signed   By: Norva Pavlov M.D.   On: 04/15/2016 21:42   Mr Brain Wo Contrast  Result Date: 04/17/2016 CLINICAL DATA:  82 year old hypertensive female with near syncopal episode. Subsequent encounter. EXAM: MRI HEAD WITHOUT CONTRAST TECHNIQUE: Multiplanar, multiecho pulse sequences of the brain and surrounding structures were obtained without intravenous contrast. COMPARISON:  04/15/2016 head CT.  04/18/2012 brain MR. FINDINGS: Brain: No acute infarct or intracranial hemorrhage. Mild global atrophy without hydrocephalus. Very mild chronic microvascular changes. Prominent peri vascular spaces incidentally noted. No intracranial mass lesion noted on this unenhanced exam. Vascular: Major intracranial vascular structures are patent. Skull and upper cervical spine: Cervical spondylotic changes with  mild spinal stenosis C3-4 level. Sinuses/Orbits: Post right lens replacement. No acute orbital abnormality. Almost complete opacification right maxillary sinus. Moderate mucosal thickening left maxillary sinus. Mild mucosal thickening ethmoid sinus air cells. Minimal mucosal thickening right frontal sinus. Other: Negative. IMPRESSION: No acute infarct. Mild global atrophy. Very mild chronic microvascular changes. Almost complete opacification right maxillary sinus. Moderate mucosal thickening left maxillary sinus. Mild mucosal thickening ethmoid sinus air cells. Electronically Signed   By: Lacy Duverney M.D.   On: 04/17/2016 10:28    EKG & Cardiac Imaging    EKG: NSR, LBBB consistent with Rebecca prior EKG's.   Echocardiogram: 03/16/16  Study Conclusions  - Left ventricle: The  cavity size was normal. There was mild   concentric hypertrophy. Systolic function was normal. The   estimated ejection fraction was in the range of 50% to 55%. Wall   motion was normal; there were no regional wall motion   abnormalities. Doppler parameters are consistent with abnormal   left ventricular relaxation (grade 1 diastolic dysfunction).   Doppler parameters are consistent with high ventricular filling   pressure. - Aortic valve: A transcatheter prosthetic valve is present and   well-seated. Peak velocity (S): 290 cm/s. Mean gradient (S): 22   mm Hg. Peak gradient (S): 34 mm Hg. - Mitral valve: Mobility of the anterior and posterior leaflets was   restricted. The findings are consistent with mild stenosis. Valve   area by pressure half-time: 2.24 cm^2. - Right ventricle: The cavity size was normal. Wall thickness was   normal. Systolic function was normal. - Tricuspid valve: There was mild regurgitation. - Inferior vena cava: The vessel was normal in size. The   respirophasic diameter changes were in the normal range (>= 50%),   consistent with normal central venous pressure.   Assessment & Plan     1.Mobitz type II AV block: Reviewed telemetry and patient had about 1 min of bradycardia, looks more consistent with Mobitz type II. She is not on any AV nodal blocking agents at home or here. However, she presents with syncope and this could be caused by Rebecca AV block. Rebecca episode of AV block was transient (around a minute long), but then does have some sinus tach with rates in the 120's. Question tachy-brady syndrome.  She will need to be seen by EP for evaluation of pacemaker implantation.   She had a right and left heart cath prior to Rebecca TAVR in 04/2013, she had mild non obstructive disease in his proximal LAD (30%), and normal right heart pressures.   2. History of severe aortic stenosis: S/p TAVR   3. Dementia: Lives with Rebecca Rebecca [name], requires 24 hour care. Family requested palliative medicine consult, which has been done.   4. Chronic diastolic CHF: Appears euvolemic, denies orthopnea.     Signed, Little IshikawaErin E Smith, NP 04/17/2016, 2:05 PM Pager: [phone]  Agree with note by Suzzette RighterErin Smith NP  We are asked to see patient with syncope. She is a patient of Dr. Theron AristaPeter Jordan's. She is an 82 year old frail appearing widowed Caucasian female who lives with Rebecca Rebecca [name]. 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 She has noncritical CAD cath and normal LV function. She had a successful T AVR by Dr. Excell Seltzerooper in 2015 for syncope and aortic stenosis. She has right bundle branch block and progressive Lewy body dementia. There was a question of seizure activity and neurology has been working Rebecca up. We'll telemetry she's had sinus tachycardia but also episodes of bradycardia with 21 heart block while sleeping. Certainly possible that this manifests itself during waking hours as syncope. She is on no negative chronotropic agents. Rebecca exam is benign. I discussed potential pacemaker with the patient and Rebecca Rebecca [name] was present during the interview. Despite Rebecca dementia they feel she may  potentially benefit from this to prevent a future episode of syncope which may lead to a disabling outcome. We'll get the electrophysiology team to further evaluate.  Runell GessJonathan J. Cristopher Ciccarelli, M.D., FACP, [facility] HospitalFACC, Earl LagosFAHA, [facility] CenterFSCAI [facility] [address] [place], [place]  [id number]  [phone] 04/17/2016 3:22 PM

## 2016-04-17 NOTE — Progress Notes (Signed)
K.Kirby N.P. Text page and made aware of patient having short run of CHB down in the 20's then back up to S.R. In the 70's Patient was asleep on her left side. Awaken patient with no complaints. R.N. Aware. Donnamarie PoagK. Kirby return call and no orders given at this time.

## 2016-04-17 NOTE — Progress Notes (Signed)
PROGRESS NOTE    Rebecca [name]  ZOX:[id number] DOB: 1926-03-02 DOA: 04/15/2016 PCP: Duane LopeAlan Ross, MD   Outpatient Specialists:     Brief Narrative:  Rebecca [name] is a 82 y.o. female with medical history significant of A.Fib on eliquis, prior AS s/p TAVR in 2015 now with 22 mean gradient across valve on echo just 1 month ago, mild dementia.  Lives with her daughter at baseline.  Today had near syncope in shower.  Per daughter she is slightly altered mental status wise from baseline, not thinking as clearly.  Patient c/o low back pain; however, this is chronic and not at all new.  No CP, SOB, fever, chills, dysuria, cough, etc.   Assessment & Plan:   Principal Problem:   Near syncope Active Problems:   Essential hypertension   Mild dementia   Confusion   Pre-syncope   Moderate episode of recurrent major depressive disorder (HCC)   Sleep-wake disorder   Somnolence   Decreased appetite   Goals of care, counseling/discussion   Palliative care by specialist   Advance care planning   ? Seizure like activity -family reports eyelid fluttering at times with no response from patient -neuro consult- depakote added -EEG negative but not currently having episode  lewy body Dementia with increasing confusion -may be worsening of dementia -follows with neuro as outpatient -family request palliative care consult -U/A negative -MRI pending  Episode of what appears to be CHB last PM -cardiology consult -not able to catch on EKG, just tele  Near syncope -Cr elevated, so suspect some dehydration -gentle IVF  Depression -has been in [place] in past -husband died 1 1/2 year ago and patient has not been the same since    DVT prophylaxis:  SCD's  Code Status: DNR   Family Communication: Called daughter  Disposition Plan:     Consultants:   neuro     Subjective: No symptoms when was in CHB  Objective: Vitals:   04/16/16 1356 04/16/16 2036 04/17/16 0214  04/17/16 0619  BP: (!) 126/58 124/64 (!) 150/65 (!) 147/73  Pulse: 97 80 72 85  Resp: 17 18 18 18   Temp: 98.4 F (36.9 C) 98.2 F (36.8 C) 98.2 F (36.8 C) 97.4 F (36.3 C)  TempSrc: Oral Oral Oral Oral  SpO2: 97% 98% 94% 95%  Weight:    58.1 kg (128 lb 1.6 oz)  Height:        Intake/Output Summary (Last 24 hours) at 04/17/16 1014 Last data filed at 04/17/16 0830  Gross per 24 hour  Intake              240 ml  Output              700 ml  Net             -460 ml   Filed Weights   04/15/16 1808 04/16/16 0104 04/17/16 0619  Weight: 59.9 kg (132 lb) 60 kg (132 lb 4.4 oz) 58.1 kg (128 lb 1.6 oz)    Examination:  General exam: Appears calm and comfortable  Respiratory system: Clear to auscultation. Respiratory effort normal. Cardiovascular system: S1 & S2 heard, RRR. No JVD, murmurs, rubs, gallops or clicks. No pedal edema. Gastrointestinal system: Abdomen is nondistended, soft and nontender. No organomegaly or masses felt. Normal bowel sounds heard. Central nervous system: Alert and oriented. No focal neurological deficits.     Data Reviewed: I have personally reviewed following labs and imaging studies  CBC:  Recent Labs Lab  04/15/16 2150  WBC 7.6  NEUTROABS 4.9  HGB 13.9  HCT 42.3  MCV 86.7  PLT 168   Basic Metabolic Panel:  Recent Labs Lab 04/15/16 2150 04/16/16 0804  NA 138 142  K 3.3* 3.7  CL 100* 107  CO2 30 26  GLUCOSE 106* 91  BUN 21* 16  CREATININE 1.28* 1.01*  CALCIUM 9.0 8.7*   GFR: Estimated Creatinine Clearance: 31.8 mL/min (by C-G formula based on SCr of 1.01 mg/dL (H)). Liver Function Tests: No results for input(s): AST, ALT, ALKPHOS, BILITOT, PROT, ALBUMIN in the last 168 hours. No results for input(s): LIPASE, AMYLASE in the last 168 hours. No results for input(s): AMMONIA in the last 168 hours. Coagulation Profile: No results for input(s): INR, PROTIME in the last 168 hours. Cardiac Enzymes: No results for input(s): CKTOTAL,  CKMB, CKMBINDEX, TROPONINI in the last 168 hours. BNP (last 3 results) No results for input(s): PROBNP in the last 8760 hours. HbA1C: No results for input(s): HGBA1C in the last 72 hours. CBG:  Recent Labs Lab 04/16/16 0724 04/16/16 1017 04/16/16 1604  GLUCAP 96 95 135*   Lipid Profile: No results for input(s): CHOL, HDL, LDLCALC, TRIG, CHOLHDL, LDLDIRECT in the last 72 hours. Thyroid Function Tests: No results for input(s): TSH, T4TOTAL, FREET4, T3FREE, THYROIDAB in the last 72 hours. Anemia Panel: No results for input(s): VITAMINB12, FOLATE, FERRITIN, TIBC, IRON, RETICCTPCT in the last 72 hours. Urine analysis:    Component Value Date/Time   COLORURINE YELLOW 04/15/2016 2357   APPEARANCEUR HAZY (A) 04/15/2016 2357   LABSPEC 1.014 04/15/2016 2357   PHURINE 6.0 04/15/2016 2357   GLUCOSEU 50 (A) 04/15/2016 2357   HGBUR NEGATIVE 04/15/2016 2357   BILIRUBINUR NEGATIVE 04/15/2016 2357   KETONESUR NEGATIVE 04/15/2016 2357   PROTEINUR NEGATIVE 04/15/2016 2357   UROBILINOGEN 0.2 06/03/2014 0740   NITRITE NEGATIVE 04/15/2016 2357   LEUKOCYTESUR NEGATIVE 04/15/2016 2357     )No results found for this or any previous visit (from the past 240 hour(s)).    Anti-infectives    None       Radiology Studies: Dg Chest 1 View  Result Date: 04/15/2016 CLINICAL DATA:  Syncopal episode EXAM: CHEST 1 VIEW COMPARISON:  07/05/2015 and 04/11/2015 radiographs. FINDINGS: 1954 hour. Stable mild cardiac enlargement and aortic atherosclerosis status post TAVR. The lungs are clear. There is no pleural effusion. Probable skin fold overlies the lower right chest laterally. No evidence of pneumothorax. The bones appear unchanged. IMPRESSION: Stable chest.  No acute cardiopulmonary process. Electronically Signed   By: Carey BullocksWilliam  Veazey M.D.   On: 04/15/2016 20:31   Dg Lumbar Spine Complete  Result Date: 04/15/2016 CLINICAL DATA:  Near syncope while showering. Pt denies LOC. Pt states she is  feeling more tired and has lower back pain. Lower back pain x 20 years. EXAM: LUMBAR SPINE - COMPLETE 4+ VIEW COMPARISON:  06/03/2014 CT FINDINGS: Mild degenerative changes are identified throughout the lumbar spine. There is increased disc height loss and sclerosis at L1-2. Question of retrolisthesis of L1 on L2 versus retropulsion of fragments. Question of discitis at this level. There is anterolisthesis of L4 on L5 by 4 mm. Dense atherosclerotic calcification of the abdominal aorta. Within the left hemipelvis there are large coarse calcifications consistent with a dermoid. IMPRESSION: 1. Question of discitis versus significant progression of degenerative changes at L1-2. 2. Consider further imaging evaluation.  MRI is recommended. 3. Left pelvic dermoid. Electronically Signed   By: Norva PavlovElizabeth  Brown M.D.   On:  04/15/2016 20:35   Ct Head Wo Contrast  Result Date: 04/15/2016 CLINICAL DATA:  LETHARGY WITH SYNCOPE near syncope while showering. Pt denies LOC. Pt states she is feeling more tired and has lower back pain PMH: HTN, High cholesterol; UTI (lower urinary tract infection); Aortic stenosis; syncope. EXAM: CT HEAD WITHOUT CONTRAST TECHNIQUE: Contiguous axial images were obtained from the base of the skull through the vertex without intravenous contrast. COMPARISON:  12/02/2013 FINDINGS: Brain: There is mild central and cortical atrophy. Periventricular white matter changes are consistent with small vessel disease. There is no intra or extra-axial fluid collection or mass lesion. The basilar cisterns and ventricles have a normal appearance. There is no CT evidence for acute infarction or hemorrhage. Vascular: There is atherosclerotic calcification of the carotid siphonsand vertebral arteries. Skull: Normal. Negative for fracture or focal lesion. Sinuses/Orbits: Significant mucoperiosteal thickening of the right maxillary sinus. Status post resection of medial walls of maxillary sinuses bilaterally. Other:  None IMPRESSION: 1.  No evidence for acute intracranial abnormality. 2. Atrophy and small vessel disease. 3. Sinus disease. Electronically Signed   By: Norva Pavlov M.D.   On: 04/15/2016 21:42        Scheduled Meds: . amLODipine  2.5 mg Oral Daily  . apixaban  2.5 mg Oral BID  . atorvastatin  20 mg Oral Daily  . calcium carbonate  1 tablet Oral Daily  . divalproex  500 mg Oral Q12H  . donepezil  10 mg Oral QHS  . feeding supplement (ENSURE ENLIVE)  237 mL Oral BID BM  . LORazepam  0.5 mg Oral BID  . memantine  10 mg Oral BID  . mirtazapine  45 mg Oral QHS  . multivitamin with minerals  1 tablet Oral Daily  . sertraline  100 mg Oral Daily  . sodium chloride flush  3 mL Intravenous Q12H   Continuous Infusions: . sodium chloride 75 mL/hr at 04/16/16 1615     LOS: 1 day    Time spent:25 min    Marquise Wicke U Maysen Bonsignore, DO [facility] Pager [phone]  If 7PM-7AM, please contact night-coverage [email or web address] Password [email or web address] 04/17/2016, 10:14 AM

## 2016-04-17 NOTE — Progress Notes (Signed)
Subjective: No further events over night but did have a run of complete heart block.  Currently no complaints.   Exam: Vitals:   04/17/16 0214 04/17/16 0619  BP: (!) 150/65 (!) 147/73  Pulse: 72 85  Resp: 18 18  Temp: 98.2 F (36.8 C) 97.4 F (36.3 C)       Gen: In bed, NAD MS: Alert and oriented CN: 2-12 intact Motor:  MAEW Sensory: intact   Pertinent Labs/Diagnostics: MRI shows no acute infarct and EEG was without epileptiform activity    Impression: Episodes of staring and eye fluttering. Started on Depakote and no further events, however she did have a a run of complete heart block at night while asleep.  Would favor a Holter monitor as out patient to see if there is corrilation.  Would continue Depakote at this time. Neurology will S/O   Felicie MornDavid Tevan Marian PA-C [facility] [phone]    04/17/2016, 10:28 AM

## 2016-04-17 NOTE — Evaluation (Signed)
Physical Therapy Evaluation Patient Details Name: Rebecca [name] MRN: [id number] DOB: 1925-06-24 Today's Date: 04/17/2016   History of Present Illness  82 yo admitted after fall after shower at home with questionable seizure. PMHx: TAVR, Afib, HTN, panic attacks, hypercholesterolemia, dementia  Clinical Impression  Patient presents with decreased activity tolerance, balance, cognitive function, and mobility, and lower extremity weakness. She would benefit from physical therapy to address these deficits and maximize function upon return home with daughter. She has a fair prognosis to achieve this due [facility]. During the treatment, patient was slightly anxious, presenting with a right hand tremor which she attributed to nervousness. Prior to using a RW , patient was unsteady but was able to improve her stability with ambulation with assistive device. She felt more secure and balanced when using RW during session. Patient was educated on discharge plan and agrees. Will continue to follow.   Orthostatic vitals:  Supine BP 164/75 (98), HR 87 Seated BP 166/84 (106), HR 93  Standing 0 minutes BP 119/85 (93), HR 108 Standing 3 minutes BP 140/85 (100), 109     Follow Up Recommendations Home health PT;Supervision for mobility/OOB    Equipment Recommendations  Rolling walker with 5" wheels;3in1 (PT)    Recommendations for Other Services OT consult     Precautions / Restrictions Precautions Precautions: Fall      Mobility  Bed Mobility Overal bed mobility: Needs Assistance Bed Mobility: Supine to Sit     Supine to sit: Min guard     General bed mobility comments: patient took increased time to move to EOB. Required verbal cuing for scooting forward.   Transfers Overall transfer level: Needs assistance Equipment used: None Transfers: Sit to/from Stand Sit to Stand: Min guard         General transfer comment: slight steadying provided  upon standing, does not use RW at home so performed without RW.   Ambulation/Gait Ambulation/Gait assistance: Min guard Ambulation Distance (Feet): 375 Feet Assistive device: Rolling walker (2 wheeled) Gait Pattern/deviations: Shuffle;Decreased stride length Gait velocity: decreased, with high step rate  Gait velocity interpretation: Below normal speed for age/gender General Gait Details: Started ambulation without AD, but shuffling gait and anxiety about mobility necessitated RW. With RW, patient had increased stride length. Patient provided cues for trunk extension and body placement in RW.  min guard for safety.   Stairs            Wheelchair Mobility    Modified Rankin (Stroke Patients Only)       Balance Overall balance assessment: Needs assistance Sitting-balance support: Feet supported Sitting balance-Leahy Scale: Good     Standing balance support: Bilateral upper extremity supported;During functional activity Standing balance-Leahy Scale: Fair Standing balance comment: patient able to stand without assistance, but performs dynamic standing mobility better with use of RW                              Pertinent Vitals/Pain Pain Assessment: No/denies pain    Home Living Family/patient expects to be discharged to:: Private residence Living Arrangements: Children (daughter ) Available Help at Discharge: Family;Available PRN/intermittently Type of Home: House Home Access: Level entry     Home Layout: Two level;Able to live on main level with bedroom/bathroom Home Equipment: None Additional Comments: patient has been living with daughter for "a long time", and daugher works so patient is home alone during the day.  Prior Function Level of Independence: Independent               Hand Dominance        Extremity/Trunk Assessment   Upper Extremity Assessment Upper Extremity Assessment: Overall WFL for tasks assessed    Lower Extremity  Assessment Lower Extremity Assessment: RLE deficits/detail;LLE deficits/detail RLE Deficits / Details: 3+/5 hip flexion, 4/5 knee extension, 4/5 knee flexion, WFL hip abduction and hip adduction LLE Deficits / Details: 3+/5 hip flexion, 4/5 knee extension, 4/5 knee flexion, WFL hip abduction and hip adduction       Communication   Communication: No difficulties  Cognition Arousal/Alertness: Awake/alert Behavior During Therapy: WFL for tasks assessed/performed Overall Cognitive Status: Impaired/Different from baseline Area of Impairment: Orientation;Memory;Safety/judgement Orientation Level: Disoriented to;Time   Memory: Decreased short-term memory   Safety/Judgement: Decreased awareness of safety          General Comments      Exercises General Exercises - Lower Extremity Long Arc Quad: AROM;Both;10 reps;Seated Hip Flexion/Marching: AROM;Both;10 reps;Seated Toe Raises: AROM;Both;10 reps;Seated   Assessment/Plan    PT Assessment Patient needs continued PT services  PT Problem List Decreased mobility;Decreased safety awareness;Decreased activity tolerance;Decreased balance;Decreased knowledge of use of DME;Decreased cognition          PT Treatment Interventions Gait training;Therapeutic exercise;Therapeutic activities;DME instruction;Functional mobility training;Balance training;Patient/family education    PT Goals (Current goals can be found in the Care Plan section)  Acute Rehab PT Goals Patient Stated Goal: return home PT Goal Formulation: With patient Time For Goal Achievement: 05/01/16 Potential to Achieve Goals: Fair    Frequency Min 3X/week   Barriers to discharge Decreased caregiver support daughter works during the day    Co-evaluation               End of Session Equipment Utilized During Treatment: Gait belt Activity Tolerance: Patient tolerated treatment well Patient left: in chair;with call bell/phone within reach;with chair alarm set Nurse  Communication: Mobility status         Time: 0732-0803 PT Time Calculation (min) (ACUTE ONLY): 31 min   Charges:   PT Evaluation $PT Eval Moderate Complexity: 1 Procedure PT Treatments $Gait Training: 8-22 mins   PT G Codes:        Keatin Benham 04/17/2016, 11:29 AM Mariabelen Pressly SPT [phone]

## 2016-04-18 DIAGNOSIS — F331 Major depressive disorder, recurrent, moderate: Secondary | ICD-10-CM

## 2016-04-18 DIAGNOSIS — Z7189 Other specified counseling: Secondary | ICD-10-CM

## 2016-04-18 LAB — GLUCOSE, CAPILLARY: GLUCOSE-CAPILLARY: 90 mg/dL (ref 65–99)

## 2016-04-18 MED ORDER — MIRTAZAPINE 45 MG PO TABS: 45.0000 mg | ORAL_TABLET | Freq: Every day | ORAL | 0 refills | Status: DC

## 2016-04-18 MED ORDER — ENSURE ENLIVE PO LIQD: 237.0000 mL | Freq: Two times a day (BID) | ORAL | 12 refills | Status: DC

## 2016-04-18 MED ORDER — DIVALPROEX SODIUM 500 MG PO DR TAB: 500.0000 mg | DELAYED_RELEASE_TABLET | Freq: Two times a day (BID) | ORAL | 0 refills | Status: DC

## 2016-04-18 NOTE — Consult Note (Signed)
            [facility] Primary Care Navigator  04/18/2016  Knox RoyaltyJennie Farrelly 07-17-1925 [id number]   Seenpatient at the bedside to identify possible discharge needs. Patient reports that she felt "dizzy and light headed" that hadled to this admission. Patient [facility] the primary [facility].   [facility] [place].  According to patient. shemanages herown medications at home using "pill box system".   Patient states that daughter Jearld Lesch([name] K.) provides transportation to her doctors'appointments.  Her daughter isthe primary caregiver at home as stated.   Discharge plan is home with home health and with daughter's assistance per patient.  Patient voiced understanding to call primary care provider's office when she gets home, for a post discharge follow-up appointment within a week or sooner if needs arise.Patient letter provided as a reminder. Patient verbalized that she will let her daughter know to call.  Per Palliative consult note, daughter is looking forward to bringing her mother home, expecting home health services and will continue with adult daycare. Daughter declines palliative services follow- up in the community. Inpatient CM states that [facility] at [facility].  Patientvoiced no other needs or concerns at this time.  For additional questions please contact:  Karin GoldenLorraine A. Brendolyn Stockley, BSN, RN-BC [facility] PRIMARY CARE Navigator Cell: [phone]

## 2016-04-18 NOTE — Progress Notes (Signed)
Daily Progress Note   Patient Name: Rebecca [name]       Date: 04/18/2016 DOB: [birth year]  Age: 82 y.o. MRN#: [id number] Attending Physician: Joseph ArtJessica U Vann, DO Primary Care Physician: Duane LopeAlan Ross, MD Admit Date: 04/15/2016  Reason for Consultation/Follow-up: Establishing goals of care  Subjective: Patient asleep. Noted no plan for pacemaker and expected to d/c home. Spoke with Rebecca MessierKathy via telephone. She is looking forward to bringing her mother home. Expecting home health services and will continue with adult daycare. She declines palliative services followup in community.  Review of Systems  Unable to perform ROS: Dementia    Length of Stay: 2  Current Medications: Scheduled Meds:  . amLODipine  2.5 mg Oral Daily  . apixaban  2.5 mg Oral BID  . atorvastatin  20 mg Oral Daily  . calcium carbonate  1 tablet Oral Daily  . divalproex  500 mg Oral Q12H  . feeding supplement (ENSURE ENLIVE)  237 mL Oral BID BM  . LORazepam  0.5 mg Oral BID  . memantine  10 mg Oral BID  . mirtazapine  45 mg Oral QHS  . multivitamin with minerals  1 tablet Oral Daily  . sertraline  100 mg Oral Daily  . sodium chloride flush  3 mL Intravenous Q12H    Continuous Infusions: . sodium chloride 75 mL/hr at 04/16/16 1615    PRN Meds:   Physical Exam  Constitutional: She appears well-nourished. No distress.  Sleeping   Cardiovascular: Normal rate and regular rhythm.   Pulmonary/Chest: Effort normal. No respiratory distress.  Skin: Skin is warm and dry. No erythema.  Nursing note and vitals reviewed.           Vital Signs: BP 140/84 (BP Location: Left Arm)   Pulse 91   Temp 98.7 F (37.1 C) (Oral)   Resp 18   Ht 5\' 3"  (1.6 m)   Wt 57.8 kg (127 lb 8 oz)   SpO2 94%   BMI 22.59 kg/m  SpO2:  SpO2: 94 % O2 Device: O2 Device: Not Delivered O2 Flow Rate:    Intake/output summary:  Intake/Output Summary (Last 24 hours) at 04/18/16 1136 Last data filed at 04/18/16 0950  Gross per 24 hour  Intake          3239.25 ml  Output  700 ml  Net          2539.25 ml   LBM: Last BM Date: 04/16/16 Baseline Weight: Weight: 59.9 kg (132 lb) Most recent weight: Weight: 57.8 kg (127 lb 8 oz)       Palliative Assessment/Data: PPS: 50%    Flowsheet Rows   Flowsheet Row Most Recent Value  Intake Tab  Referral Department  Hospitalist  Unit at Time of Referral  Cardiac/Telemetry Unit  Palliative Care Primary Diagnosis  Neurology  Date Notified  04/16/16  Palliative Care Type  New Palliative care  Reason for referral  Clarify Goals of Care, Non-pain Symptom  Date of Admission  04/15/16  Date first seen by Palliative Care  04/16/16  # of days Palliative referral response time  0 Day(s)  # of days IP prior to Palliative referral  1  Clinical Assessment  Palliative Performance Scale Score  50%  Psychosocial & Spiritual Assessment  Social Work Plan of Care  Offered and declined, Ship broker patient/family wishes with healthcare team, Referral to community resources, Education on Hospice, Advance care planning, Participated in Family meeting  Palliative Care Outcomes  Patient/Family meeting held?  Yes  Who was at the meeting?  daughter- Rebecca [name]  Palliative Care Outcomes  Provided advance care planning, Improved non-pain symptom therapy, Provided psychosocial or spiritual support, Clarified goals of care, Linked to palliative care logitudinal support, Counseled regarding hospice  Patient/Family wishes: Interventions discontinued/not started   Tube feedings/TPN  Palliative Care follow-up planned  Yes, Facility      Patient Active Problem List   Diagnosis Date Noted  . Moderate episode of recurrent major depressive disorder (HCC)   . Sleep-wake disorder   . Somnolence   .  Decreased appetite   . Goals of care, counseling/discussion   . Palliative care by specialist   . Advance care planning   . Pre-syncope 04/16/2016  . Confusion 04/15/2016  . MDD (major depressive disorder), single episode, moderate (HCC) 07/04/2015  . S/P TAVR (transcatheter aortic valve replacement) 02/02/2014  . Severe aortic stenosis 02/02/2014  . Sinus tachycardia 12/04/2013  . Sinus bradycardia 12/04/2013  . Encounter for therapeutic drug monitoring 06/17/2013  . Mild dementia 11/06/2012  . Paroxysmal atrial fibrillation (HCC)   . PAF (paroxysmal atrial fibrillation) (HCC) 10/03/2012  . Long term (current) use of anticoagulants 10/03/2012  . Syncope 08/21/2012  . Aortic stenosis, severe 07/09/2012  . Near syncope 07/08/2012  . UTI (lower urinary tract infection) 07/08/2012  . BRONCHIECTASIS 12/09/2007  . SINUSITIS, CHRONIC 07/07/2007  . BRONCHITIS, CHRONIC 07/07/2007  . HYPERLIPIDEMIA 06/09/2007  . Essential hypertension 06/09/2007  . ALLERGIC RHINITIS 06/09/2007  . OSTEOPOROSIS 06/09/2007    Palliative Care Assessment & Plan   Patient Profile: 82 y.o. female  with past medical history of Lewy-Body dementia, A-fib (on eliquis), s/p TAVR (2015), admitted on 04/15/2016 with syncopal episode. Workup reveals dehydration, depression, possible seizure (negative EEG, MRI pending). Palliative medicine consulted at the request of family for GOC and assistance with depression and dementia symptoms.   Assessment/Recommendations/Plan   D/C home with daughter  Rebecca [name] declines palliative f/u in community at this time but appreciative of inpatient discussion, states she will contact in future if needed   Code Status:  DNR  Prognosis:   Unable to determine  Discharge Planning:  Home with Home Health  Care plan was discussed with Shea Evans, patient's daughter.  Thank you for allowing the Palliative Medicine Team to assist in the care of this patient.   Time  In: 1120  Time Out: 1140 Total Time 20 mins Prolonged Time Billed No      Greater than 50%  of this time was spent counseling and coordinating care related to the above assessment and plan.  Ocie BobKasie Mahan, AGNP-C Palliative Medicine   Please contact Palliative Medicine Team phone at [phone] for questions and concerns.

## 2016-04-18 NOTE — Discharge Summary (Signed)
Physician Discharge Summary  Rebecca RoyaltyJennie Yarbro ZOX:[id number] DOB: 25-Jul-1925 DOA: 04/15/2016  PCP: Duane LopeAlan Ross, MD  Admit date: 04/15/2016 Discharge date: 04/18/2016   Recommendations for Outpatient Follow-Up:   1. Home with home health and family supervision 2. New seizure medication started and will need to be followed 3. Risk of eliquis will also need to be addressed with recent falls 4. Able to return to adult daycare   Discharge Diagnosis:   Principal Problem:   Near syncope Active Problems:   Essential hypertension   Mild dementia   Confusion   Pre-syncope   Moderate episode of recurrent major depressive disorder (HCC)   Sleep-wake disorder   Somnolence   Decreased appetite   Goals of care, counseling/discussion   Palliative care by specialist   Advance care planning   Discharge disposition:  Home  Discharge Condition: Improved.  Diet recommendation: Low sodium, heart healthy  Wound care: None.   History of Present Illness:   Rebecca RoyaltyJennie [name] is a 82 y.o. female with medical history significant of A.Fib on eliquis, prior AS s/p TAVR in 2015 now with 22 mean gradient across valve on echo just 1 month ago, mild dementia.  Lives with her daughter at baseline.  Today had near syncope in shower.  Per daughter she is slightly altered mental status wise from baseline, not thinking as clearly.  Patient c/o low back pain; however, this is chronic and not at all new.  No CP, SOB, fever, chills, dysuria, cough, etc.    Hospital Course by Problem:   Seizure like activity -family reports eyelid fluttering at times with no response from patient -neuro consult- depakote added- will need to be followed as outpatient -EEG negative but not currently having episode  lewy body Dementia with increasing confusion -may be worsening of dementia -follows with neuro as outpatient-- appointment scheduled in January -family request palliative care consult- done, not yet hospice  appropriate -U/A negative -MRI done  Transient CHB -cardiology/EP consult -d/c aricept -outpatient follow up if re-occurs  Near syncope -Cr elevated, so suspect some dehydration -gentle IVF  Depression -has been in [place] in past -husband died 1 1/2 year ago and patient has not been the same since -outpatient follow up    Medical Consultants:    Cards  EP  neuro   Discharge Exam:   Vitals:   04/18/16 0356 04/18/16 1202  BP: 140/84 (!) 146/63  Pulse: 91 85  Resp: 18 17  Temp: 98.7 F (37.1 C) 98.5 F (36.9 C)   Vitals:   04/17/16 1207 04/17/16 2120 04/18/16 0356 04/18/16 1202  BP: (!) 191/77 (!) 160/72 140/84 (!) 146/63  Pulse: 97 88 91 85  Resp: 18 18 18 17   Temp: 98.1 F (36.7 C) 98.4 F (36.9 C) 98.7 F (37.1 C) 98.5 F (36.9 C)  TempSrc: Oral Oral Oral Oral  SpO2: 96% 96% 94% 96%  Weight:   57.8 kg (127 lb 8 oz)   Height:        Gen:  NAD Daughter updated on phone   The results of significant diagnostics from this hospitalization (including imaging, microbiology, ancillary and laboratory) are listed below for reference.     Procedures and Diagnostic Studies:   Dg Chest 1 View  Result Date: 04/15/2016 CLINICAL DATA:  Syncopal episode EXAM: CHEST 1 VIEW COMPARISON:  07/05/2015 and 04/11/2015 radiographs. FINDINGS: 1954 hour. Stable mild cardiac enlargement and aortic atherosclerosis status post TAVR. The lungs are clear. There is no pleural effusion. Probable skin fold  overlies the lower right chest laterally. No evidence of pneumothorax. The bones appear unchanged. IMPRESSION: Stable chest.  No acute cardiopulmonary process. Electronically Signed   By: Carey Bullocks M.D.   On: 04/15/2016 20:31   Dg Lumbar Spine Complete  Result Date: 04/15/2016 CLINICAL DATA:  Near syncope while showering. Pt denies LOC. Pt states she is feeling more tired and has lower back pain. Lower back pain x 20 years. EXAM: LUMBAR SPINE - COMPLETE 4+ VIEW  COMPARISON:  06/03/2014 CT FINDINGS: Mild degenerative changes are identified throughout the lumbar spine. There is increased disc height loss and sclerosis at L1-2. Question of retrolisthesis of L1 on L2 versus retropulsion of fragments. Question of discitis at this level. There is anterolisthesis of L4 on L5 by 4 mm. Dense atherosclerotic calcification of the abdominal aorta. Within the left hemipelvis there are large coarse calcifications consistent with a dermoid. IMPRESSION: 1. Question of discitis versus significant progression of degenerative changes at L1-2. 2. Consider further imaging evaluation.  MRI is recommended. 3. Left pelvic dermoid. Electronically Signed   By: Norva Pavlov M.D.   On: 04/15/2016 20:35   Ct Head Wo Contrast  Result Date: 04/15/2016 CLINICAL DATA:  LETHARGY WITH SYNCOPE near syncope while showering. Pt denies LOC. Pt states she is feeling more tired and has lower back pain PMH: HTN, High cholesterol; UTI (lower urinary tract infection); Aortic stenosis; syncope. EXAM: CT HEAD WITHOUT CONTRAST TECHNIQUE: Contiguous axial images were obtained from the base of the skull through the vertex without intravenous contrast. COMPARISON:  12/02/2013 FINDINGS: Brain: There is mild central and cortical atrophy. Periventricular white matter changes are consistent with small vessel disease. There is no intra or extra-axial fluid collection or mass lesion. The basilar cisterns and ventricles have a normal appearance. There is no CT evidence for acute infarction or hemorrhage. Vascular: There is atherosclerotic calcification of the carotid siphonsand vertebral arteries. Skull: Normal. Negative for fracture or focal lesion. Sinuses/Orbits: Significant mucoperiosteal thickening of the right maxillary sinus. Status post resection of medial walls of maxillary sinuses bilaterally. Other: None IMPRESSION: 1.  No evidence for acute intracranial abnormality. 2. Atrophy and small vessel disease. 3.  Sinus disease. Electronically Signed   By: Norva Pavlov M.D.   On: 04/15/2016 21:42     Labs:   Basic Metabolic Panel:  Recent Labs Lab 04/15/16 2150 04/16/16 0804  NA 138 142  K 3.3* 3.7  CL 100* 107  CO2 30 26  GLUCOSE 106* 91  BUN 21* 16  CREATININE 1.28* 1.01*  CALCIUM 9.0 8.7*   GFR Estimated Creatinine Clearance: 31.8 mL/min (by C-G formula based on SCr of 1.01 mg/dL (H)). Liver Function Tests: No results for input(s): AST, ALT, ALKPHOS, BILITOT, PROT, ALBUMIN in the last 168 hours. No results for input(s): LIPASE, AMYLASE in the last 168 hours. No results for input(s): AMMONIA in the last 168 hours. Coagulation profile No results for input(s): INR, PROTIME in the last 168 hours.  CBC:  Recent Labs Lab 04/15/16 2150  WBC 7.6  NEUTROABS 4.9  HGB 13.9  HCT 42.3  MCV 86.7  PLT 168   Cardiac Enzymes: No results for input(s): CKTOTAL, CKMB, CKMBINDEX, TROPONINI in the last 168 hours. BNP: Invalid input(s): POCBNP CBG:  Recent Labs Lab 04/16/16 0724 04/16/16 1017 04/16/16 1604 04/17/16 0611 04/18/16 0616  GLUCAP 96 95 135* 95 90   D-Dimer No results for input(s): DDIMER in the last 72 hours. Hgb A1c No results for input(s): HGBA1C in the last  72 hours. Lipid Profile No results for input(s): CHOL, HDL, LDLCALC, TRIG, CHOLHDL, LDLDIRECT in the last 72 hours. Thyroid function studies No results for input(s): TSH, T4TOTAL, T3FREE, THYROIDAB in the last 72 hours.  Invalid input(s): FREET3 Anemia work up No results for input(s): VITAMINB12, FOLATE, FERRITIN, TIBC, IRON, RETICCTPCT in the last 72 hours. Microbiology No results found for this or any previous visit (from the past 240 hour(s)).   Discharge Instructions:   Discharge Instructions    Diet general    Complete by:  As directed    Discharge instructions    Complete by:  As directed    Home health Outpatient neuro follow up for new seizure medication   Increase activity slowly     Complete by:  As directed      Allergies as of 04/18/2016   No Known Allergies     Medication List    STOP taking these medications   donepezil 10 MG tablet Commonly known as:  ARICEPT   PRESCRIPTION MEDICATION     TAKE these medications   amLODipine 2.5 MG tablet Commonly known as:  NORVASC Take 1 tablet (2.5 mg total) by mouth daily.   atorvastatin 20 MG tablet Commonly known as:  LIPITOR Take 20 mg by mouth daily.   BESIVANCE 0.6 % Susp Generic drug:  Besifloxacin HCl Place 1 drop into the right eye 3 (three) times daily. Only use for 3 days after eye injections. Gets injection every 3 months.   calcium citrate 950 MG tablet Commonly known as:  CALCITRATE - dosed in mg elemental calcium Take 1 tablet by mouth daily.   divalproex 500 MG DR tablet Commonly known as:  DEPAKOTE Take 1 tablet (500 mg total) by mouth every 12 (twelve) hours.   ELIQUIS 2.5 MG Tabs tablet Generic drug:  apixaban TAKE 1 TABLET (2.5 MG TOTAL) BY MOUTH 2 (TWO) TIMES DAILY.   feeding supplement (ENSURE ENLIVE) Liqd Take 237 mLs by mouth 2 (two) times daily between meals.   LORazepam 0.5 MG tablet Commonly known as:  ATIVAN Take 1 tablet by mouth 2 (two) times daily.   memantine 10 MG tablet Commonly known as:  NAMENDA Take 1 tablet (10 mg total) by mouth 2 (two) times daily.   mirtazapine 45 MG tablet Commonly known as:  REMERON Take 1 tablet (45 mg total) by mouth at bedtime. What changed:  medication strength  how much to take   multivitamin capsule Take 1 capsule by mouth daily.   sertraline 100 MG tablet Commonly known as:  ZOLOFT Take 1 tablet by mouth every morning.      Follow-up Information    Duane LopeAlan Ross, MD. Go on 04/26/2016.   Specialty:  Family Medicine Why:  .APPT AT 11:30AM, PLEASE ARRIVE AT 11:15AM Contact information: [address] [place] [address] [phone]        Joycelyn SchmidPENUMALLI,VIKRAM, MD. Go on 05/16/2016.   Specialties:  Neurology,  Radiology Why:  for seizure follow up  ARRIVE AT OFFICE AT 8:00 AM FOR FOLLOW UP Contact information: [address] [place] [address] [phone]            Time coordinating discharge:  35 min  Signed:  [place] Marian Juanetta GoslingU Sabastion Hrdlicka   [facility] 04/18/2016, 12:34 PM

## 2016-04-18 NOTE — Care Management Note (Signed)
Case Management Note Donn PieriniKristi Aftyn Nott RN, BSN Unit 2W-Case Manager [phone]  Patient Details  Name: Rebecca RoyaltyJennie [name] MRN: [id number] Date of Birth: 1925/06/09  Subjective/Objective:   Pt admitted with near syncope and increased confusion                 Action/Plan: PTA pt lived at home with daughter- referral received for PC services in the home- spoke with pt's daughter mary at the bedside- per conversation daughter in unsure about PC services at this time- waiting to see what pt's plan of care will be - PTA per daughter pt was going to an adult day care during the day while daughter was at work- daughter would like to see if pt able to return there. Pt is [facility] eligible- daughter familiar with [facility]. Pt would eligible for [facility] referral with [facility] for PC services if daughter choices that route- list also provided for other options.  CM will f/u with daughter tomorrow regarding d/c plans and what daughter decides.   Expected Discharge Date:    12/20/'17              Expected Discharge Plan:  Home w Home Health Services  In-House Referral:     Discharge planning Services  CM Consult  Post Acute Care Choice:  Home Health Choice offered to:  Adult Children  DME Arranged:    DME Agency:     HH Arranged:  PT HH Agency:  [facility]  Status of Service:  Completed, signed off  If discussed at Long Length of Stay Meetings, dates discussed:    Discharge Disposition: home with home health   Additional Comments:  04/18/16- 1230- [name] RN, CM- pt for d/c home today with daughter- order placed for HHPT, spoke with daughterCorrie Dandy- [name] via TC- at this time she does not want to do PC in the community- no referral for PC services will be done- [facility] offered daughter states that they have used [facility] in the past and want to use them again. Also [facility]- states not at this time. [facility]PT [facility].   Darrold SpanWebster, Neale Marzette Hall, RN 04/18/2016, 12:37 PM

## 2016-04-19 DIAGNOSIS — I10 Essential (primary) hypertension: Secondary | ICD-10-CM | POA: Diagnosis not present

## 2016-04-19 DIAGNOSIS — Z7901 Long term (current) use of anticoagulants: Secondary | ICD-10-CM | POA: Diagnosis not present

## 2016-04-19 DIAGNOSIS — Z87440 Personal history of urinary (tract) infections: Secondary | ICD-10-CM | POA: Diagnosis not present

## 2016-04-19 DIAGNOSIS — G3183 Dementia with Lewy bodies: Secondary | ICD-10-CM | POA: Diagnosis not present

## 2016-04-19 DIAGNOSIS — Z87891 Personal history of nicotine dependence: Secondary | ICD-10-CM | POA: Diagnosis not present

## 2016-04-19 DIAGNOSIS — F0280 Dementia in other diseases classified elsewhere without behavioral disturbance: Secondary | ICD-10-CM | POA: Diagnosis not present

## 2016-04-19 DIAGNOSIS — I4891 Unspecified atrial fibrillation: Secondary | ICD-10-CM | POA: Diagnosis not present

## 2016-04-19 DIAGNOSIS — E785 Hyperlipidemia, unspecified: Secondary | ICD-10-CM | POA: Diagnosis not present

## 2016-04-19 DIAGNOSIS — F331 Major depressive disorder, recurrent, moderate: Secondary | ICD-10-CM | POA: Diagnosis not present

## 2016-04-19 DIAGNOSIS — I350 Nonrheumatic aortic (valve) stenosis: Secondary | ICD-10-CM | POA: Diagnosis not present

## 2016-04-19 DIAGNOSIS — Z952 Presence of prosthetic heart valve: Secondary | ICD-10-CM | POA: Diagnosis not present

## 2016-04-24 DIAGNOSIS — I10 Essential (primary) hypertension: Secondary | ICD-10-CM | POA: Diagnosis not present

## 2016-04-24 DIAGNOSIS — I4891 Unspecified atrial fibrillation: Secondary | ICD-10-CM | POA: Diagnosis not present

## 2016-04-24 DIAGNOSIS — I350 Nonrheumatic aortic (valve) stenosis: Secondary | ICD-10-CM | POA: Diagnosis not present

## 2016-04-24 DIAGNOSIS — F0280 Dementia in other diseases classified elsewhere without behavioral disturbance: Secondary | ICD-10-CM | POA: Diagnosis not present

## 2016-04-24 DIAGNOSIS — G3183 Dementia with Lewy bodies: Secondary | ICD-10-CM | POA: Diagnosis not present

## 2016-04-24 DIAGNOSIS — F331 Major depressive disorder, recurrent, moderate: Secondary | ICD-10-CM | POA: Diagnosis not present

## 2016-04-26 DIAGNOSIS — F331 Major depressive disorder, recurrent, moderate: Secondary | ICD-10-CM | POA: Diagnosis not present

## 2016-04-26 DIAGNOSIS — I4891 Unspecified atrial fibrillation: Secondary | ICD-10-CM | POA: Diagnosis not present

## 2016-04-26 DIAGNOSIS — I10 Essential (primary) hypertension: Secondary | ICD-10-CM | POA: Diagnosis not present

## 2016-04-26 DIAGNOSIS — I350 Nonrheumatic aortic (valve) stenosis: Secondary | ICD-10-CM | POA: Diagnosis not present

## 2016-04-26 DIAGNOSIS — G3183 Dementia with Lewy bodies: Secondary | ICD-10-CM | POA: Diagnosis not present

## 2016-04-26 DIAGNOSIS — F0280 Dementia in other diseases classified elsewhere without behavioral disturbance: Secondary | ICD-10-CM | POA: Diagnosis not present

## 2016-04-27 DIAGNOSIS — F418 Other specified anxiety disorders: Secondary | ICD-10-CM | POA: Diagnosis not present

## 2016-04-27 DIAGNOSIS — I10 Essential (primary) hypertension: Secondary | ICD-10-CM | POA: Diagnosis not present

## 2016-04-27 DIAGNOSIS — F0390 Unspecified dementia without behavioral disturbance: Secondary | ICD-10-CM | POA: Diagnosis not present

## 2016-05-01 DIAGNOSIS — I350 Nonrheumatic aortic (valve) stenosis: Secondary | ICD-10-CM | POA: Diagnosis not present

## 2016-05-01 DIAGNOSIS — I10 Essential (primary) hypertension: Secondary | ICD-10-CM | POA: Diagnosis not present

## 2016-05-01 DIAGNOSIS — G3183 Dementia with Lewy bodies: Secondary | ICD-10-CM | POA: Diagnosis not present

## 2016-05-01 DIAGNOSIS — H3530 Unspecified macular degeneration: Secondary | ICD-10-CM | POA: Diagnosis not present

## 2016-05-01 DIAGNOSIS — I4891 Unspecified atrial fibrillation: Secondary | ICD-10-CM | POA: Diagnosis not present

## 2016-05-01 DIAGNOSIS — E7800 Pure hypercholesterolemia, unspecified: Secondary | ICD-10-CM | POA: Diagnosis not present

## 2016-05-01 DIAGNOSIS — F331 Major depressive disorder, recurrent, moderate: Secondary | ICD-10-CM | POA: Diagnosis not present

## 2016-05-01 DIAGNOSIS — F0280 Dementia in other diseases classified elsewhere without behavioral disturbance: Secondary | ICD-10-CM | POA: Diagnosis not present

## 2016-05-03 ENCOUNTER — Telehealth: Payer: Self-pay | Admitting: *Deleted

## 2016-05-03 DIAGNOSIS — F0280 Dementia in other diseases classified elsewhere without behavioral disturbance: Secondary | ICD-10-CM | POA: Diagnosis not present

## 2016-05-03 DIAGNOSIS — I4891 Unspecified atrial fibrillation: Secondary | ICD-10-CM | POA: Diagnosis not present

## 2016-05-03 DIAGNOSIS — I10 Essential (primary) hypertension: Secondary | ICD-10-CM | POA: Diagnosis not present

## 2016-05-03 DIAGNOSIS — I350 Nonrheumatic aortic (valve) stenosis: Secondary | ICD-10-CM | POA: Diagnosis not present

## 2016-05-03 DIAGNOSIS — G3183 Dementia with Lewy bodies: Secondary | ICD-10-CM | POA: Diagnosis not present

## 2016-05-03 DIAGNOSIS — F331 Major depressive disorder, recurrent, moderate: Secondary | ICD-10-CM | POA: Diagnosis not present

## 2016-05-03 NOTE — Telephone Encounter (Signed)
Called patient's daughter to reschedule follow up in April. She stated her mother already has new appt in March, confirmed time of appt. She verbalized understanding.

## 2016-05-09 DIAGNOSIS — I4891 Unspecified atrial fibrillation: Secondary | ICD-10-CM | POA: Diagnosis not present

## 2016-05-09 DIAGNOSIS — F0280 Dementia in other diseases classified elsewhere without behavioral disturbance: Secondary | ICD-10-CM | POA: Diagnosis not present

## 2016-05-09 DIAGNOSIS — I350 Nonrheumatic aortic (valve) stenosis: Secondary | ICD-10-CM | POA: Diagnosis not present

## 2016-05-09 DIAGNOSIS — G3183 Dementia with Lewy bodies: Secondary | ICD-10-CM | POA: Diagnosis not present

## 2016-05-09 DIAGNOSIS — I10 Essential (primary) hypertension: Secondary | ICD-10-CM | POA: Diagnosis not present

## 2016-05-09 DIAGNOSIS — F331 Major depressive disorder, recurrent, moderate: Secondary | ICD-10-CM | POA: Diagnosis not present

## 2016-05-10 DIAGNOSIS — F331 Major depressive disorder, recurrent, moderate: Secondary | ICD-10-CM | POA: Diagnosis not present

## 2016-05-10 DIAGNOSIS — F0280 Dementia in other diseases classified elsewhere without behavioral disturbance: Secondary | ICD-10-CM | POA: Diagnosis not present

## 2016-05-10 DIAGNOSIS — I350 Nonrheumatic aortic (valve) stenosis: Secondary | ICD-10-CM | POA: Diagnosis not present

## 2016-05-10 DIAGNOSIS — G3183 Dementia with Lewy bodies: Secondary | ICD-10-CM | POA: Diagnosis not present

## 2016-05-10 DIAGNOSIS — I4891 Unspecified atrial fibrillation: Secondary | ICD-10-CM | POA: Diagnosis not present

## 2016-05-10 DIAGNOSIS — I10 Essential (primary) hypertension: Secondary | ICD-10-CM | POA: Diagnosis not present

## 2016-05-15 DIAGNOSIS — F0280 Dementia in other diseases classified elsewhere without behavioral disturbance: Secondary | ICD-10-CM | POA: Diagnosis not present

## 2016-05-15 DIAGNOSIS — I4891 Unspecified atrial fibrillation: Secondary | ICD-10-CM | POA: Diagnosis not present

## 2016-05-15 DIAGNOSIS — G3183 Dementia with Lewy bodies: Secondary | ICD-10-CM | POA: Diagnosis not present

## 2016-05-15 DIAGNOSIS — F331 Major depressive disorder, recurrent, moderate: Secondary | ICD-10-CM | POA: Diagnosis not present

## 2016-05-15 DIAGNOSIS — I350 Nonrheumatic aortic (valve) stenosis: Secondary | ICD-10-CM | POA: Diagnosis not present

## 2016-05-15 DIAGNOSIS — I10 Essential (primary) hypertension: Secondary | ICD-10-CM | POA: Diagnosis not present

## 2016-05-15 DIAGNOSIS — Z111 Encounter for screening for respiratory tuberculosis: Secondary | ICD-10-CM | POA: Diagnosis not present

## 2016-05-16 ENCOUNTER — Ambulatory Visit: Payer: Medicare Other | Admitting: Diagnostic Neuroimaging

## 2016-05-20 ENCOUNTER — Other Ambulatory Visit (HOSPITAL_COMMUNITY): Payer: Self-pay | Admitting: Psychiatry

## 2016-05-24 ENCOUNTER — Encounter (INDEPENDENT_AMBULATORY_CARE_PROVIDER_SITE_OTHER): Payer: Medicare Other | Admitting: Ophthalmology

## 2016-05-25 DIAGNOSIS — F331 Major depressive disorder, recurrent, moderate: Secondary | ICD-10-CM | POA: Diagnosis not present

## 2016-05-25 DIAGNOSIS — G3183 Dementia with Lewy bodies: Secondary | ICD-10-CM | POA: Diagnosis not present

## 2016-05-25 DIAGNOSIS — F0280 Dementia in other diseases classified elsewhere without behavioral disturbance: Secondary | ICD-10-CM | POA: Diagnosis not present

## 2016-05-25 DIAGNOSIS — I350 Nonrheumatic aortic (valve) stenosis: Secondary | ICD-10-CM | POA: Diagnosis not present

## 2016-05-25 DIAGNOSIS — I4891 Unspecified atrial fibrillation: Secondary | ICD-10-CM | POA: Diagnosis not present

## 2016-05-25 DIAGNOSIS — I10 Essential (primary) hypertension: Secondary | ICD-10-CM | POA: Diagnosis not present

## 2016-05-28 DIAGNOSIS — R630 Anorexia: Secondary | ICD-10-CM | POA: Diagnosis not present

## 2016-05-28 DIAGNOSIS — Z79899 Other long term (current) drug therapy: Secondary | ICD-10-CM | POA: Diagnosis not present

## 2016-05-28 DIAGNOSIS — F413 Other mixed anxiety disorders: Secondary | ICD-10-CM | POA: Diagnosis not present

## 2016-05-28 DIAGNOSIS — I480 Paroxysmal atrial fibrillation: Secondary | ICD-10-CM | POA: Diagnosis not present

## 2016-05-28 DIAGNOSIS — I10 Essential (primary) hypertension: Secondary | ICD-10-CM | POA: Diagnosis not present

## 2016-05-28 DIAGNOSIS — G301 Alzheimer's disease with late onset: Secondary | ICD-10-CM | POA: Diagnosis not present

## 2016-05-28 DIAGNOSIS — F0390 Unspecified dementia without behavioral disturbance: Secondary | ICD-10-CM | POA: Diagnosis not present

## 2016-06-11 ENCOUNTER — Encounter (HOSPITAL_COMMUNITY): Payer: Self-pay | Admitting: Emergency Medicine

## 2016-06-11 ENCOUNTER — Emergency Department (HOSPITAL_COMMUNITY): Payer: Medicare Other

## 2016-06-11 ENCOUNTER — Inpatient Hospital Stay (HOSPITAL_COMMUNITY)
Admission: EM | Admit: 2016-06-11 | Discharge: 2016-06-16 | DRG: 872 | Disposition: A | Discharge status: Skilled Nursing Facility | Payer: Medicare Other | Attending: Internal Medicine | Admitting: Internal Medicine

## 2016-06-11 DIAGNOSIS — A419 Sepsis, unspecified organism: Secondary | ICD-10-CM | POA: Diagnosis present

## 2016-06-11 DIAGNOSIS — Z952 Presence of prosthetic heart valve: Secondary | ICD-10-CM | POA: Diagnosis not present

## 2016-06-11 DIAGNOSIS — E7800 Pure hypercholesterolemia, unspecified: Secondary | ICD-10-CM | POA: Diagnosis present

## 2016-06-11 DIAGNOSIS — N12 Tubulo-interstitial nephritis, not specified as acute or chronic: Secondary | ICD-10-CM | POA: Diagnosis not present

## 2016-06-11 DIAGNOSIS — S0990XA Unspecified injury of head, initial encounter: Secondary | ICD-10-CM | POA: Diagnosis not present

## 2016-06-11 DIAGNOSIS — S299XXA Unspecified injury of thorax, initial encounter: Secondary | ICD-10-CM | POA: Diagnosis not present

## 2016-06-11 DIAGNOSIS — Z66 Do not resuscitate: Secondary | ICD-10-CM | POA: Diagnosis present

## 2016-06-11 DIAGNOSIS — Z79899 Other long term (current) drug therapy: Secondary | ICD-10-CM

## 2016-06-11 DIAGNOSIS — I480 Paroxysmal atrial fibrillation: Secondary | ICD-10-CM | POA: Diagnosis present

## 2016-06-11 DIAGNOSIS — R55 Syncope and collapse: Secondary | ICD-10-CM | POA: Diagnosis present

## 2016-06-11 DIAGNOSIS — Z7901 Long term (current) use of anticoagulants: Secondary | ICD-10-CM | POA: Diagnosis not present

## 2016-06-11 DIAGNOSIS — A4151 Sepsis due to Escherichia coli [E. coli]: Principal | ICD-10-CM | POA: Diagnosis present

## 2016-06-11 DIAGNOSIS — F0390 Unspecified dementia without behavioral disturbance: Secondary | ICD-10-CM | POA: Diagnosis present

## 2016-06-11 DIAGNOSIS — Z9181 History of falling: Secondary | ICD-10-CM | POA: Diagnosis not present

## 2016-06-11 DIAGNOSIS — S199XXA Unspecified injury of neck, initial encounter: Secondary | ICD-10-CM | POA: Diagnosis not present

## 2016-06-11 DIAGNOSIS — N390 Urinary tract infection, site not specified: Secondary | ICD-10-CM | POA: Diagnosis not present

## 2016-06-11 DIAGNOSIS — I10 Essential (primary) hypertension: Secondary | ICD-10-CM | POA: Diagnosis present

## 2016-06-11 DIAGNOSIS — B9620 Unspecified Escherichia coli [E. coli] as the cause of diseases classified elsewhere: Secondary | ICD-10-CM

## 2016-06-11 DIAGNOSIS — Z23 Encounter for immunization: Secondary | ICD-10-CM

## 2016-06-11 DIAGNOSIS — S0003XA Contusion of scalp, initial encounter: Secondary | ICD-10-CM | POA: Diagnosis not present

## 2016-06-11 DIAGNOSIS — E876 Hypokalemia: Secondary | ICD-10-CM | POA: Diagnosis not present

## 2016-06-11 DIAGNOSIS — Z87891 Personal history of nicotine dependence: Secondary | ICD-10-CM

## 2016-06-11 DIAGNOSIS — R7881 Bacteremia: Secondary | ICD-10-CM

## 2016-06-11 DIAGNOSIS — R259 Unspecified abnormal involuntary movements: Secondary | ICD-10-CM | POA: Diagnosis not present

## 2016-06-11 DIAGNOSIS — F0280 Dementia in other diseases classified elsewhere without behavioral disturbance: Secondary | ICD-10-CM | POA: Diagnosis present

## 2016-06-11 DIAGNOSIS — F329 Major depressive disorder, single episode, unspecified: Secondary | ICD-10-CM | POA: Diagnosis present

## 2016-06-11 DIAGNOSIS — F410 Panic disorder [episodic paroxysmal anxiety] without agoraphobia: Secondary | ICD-10-CM | POA: Diagnosis present

## 2016-06-11 DIAGNOSIS — G3183 Dementia with Lewy bodies: Secondary | ICD-10-CM | POA: Diagnosis present

## 2016-06-11 DIAGNOSIS — R6884 Jaw pain: Secondary | ICD-10-CM | POA: Diagnosis not present

## 2016-06-11 HISTORY — DX: Dementia with Lewy bodies: G31.83

## 2016-06-11 HISTORY — DX: Dementia in other diseases classified elsewhere, unspecified severity, without behavioral disturbance, psychotic disturbance, mood disturbance, and anxiety: F02.80

## 2016-06-11 HISTORY — DX: Conduction disorder, unspecified: I45.9

## 2016-06-11 LAB — COMPREHENSIVE METABOLIC PANEL
ALT: 14 U/L (ref 14–54)
AST: 22 U/L (ref 15–41)
Albumin: 3.1 g/dL — ABNORMAL LOW (ref 3.5–5.0)
Alkaline Phosphatase: 118 U/L (ref 38–126)
Anion gap: 12 (ref 5–15)
BUN: 16 mg/dL (ref 6–20)
CHLORIDE: 98 mmol/L — AB (ref 101–111)
CO2: 26 mmol/L (ref 22–32)
Calcium: 8.7 mg/dL — ABNORMAL LOW (ref 8.9–10.3)
Creatinine, Ser: 0.94 mg/dL (ref 0.44–1.00)
GFR, EST NON AFRICAN AMERICAN: 53 mL/min — AB (ref >60)
Glucose, Bld: 144 mg/dL — ABNORMAL HIGH (ref 65–99)
POTASSIUM: 3.1 mmol/L — AB (ref 3.5–5.1)
Sodium: 136 mmol/L (ref 135–145)
TOTAL PROTEIN: 6.5 g/dL (ref 6.5–8.1)
Total Bilirubin: 0.6 mg/dL (ref 0.3–1.2)

## 2016-06-11 LAB — CBC WITH DIFFERENTIAL/PLATELET
Basophils Absolute: 0 10*3/uL (ref 0.0–0.1)
Basophils Relative: 0 %
EOS ABS: 0 10*3/uL (ref 0.0–0.7)
EOS PCT: 0 %
HCT: 38.1 % (ref 36.0–46.0)
HEMOGLOBIN: 12.7 g/dL (ref 12.0–15.0)
Lymphocytes Relative: 10 %
Lymphs Abs: 1.3 10*3/uL (ref 0.7–4.0)
MCH: 28.3 pg (ref 26.0–34.0)
MCHC: 33.3 g/dL (ref 30.0–36.0)
MCV: 84.9 fL (ref 78.0–100.0)
Monocytes Absolute: 1.1 10*3/uL — ABNORMAL HIGH (ref 0.1–1.0)
Monocytes Relative: 8 %
NEUTROS PCT: 82 %
Neutro Abs: 10.6 10*3/uL — ABNORMAL HIGH (ref 1.7–7.7)
Platelets: 123 10*3/uL — ABNORMAL LOW (ref 150–400)
RBC: 4.49 MIL/uL (ref 3.87–5.11)
RDW: 16 % — ABNORMAL HIGH (ref 11.5–15.5)
WBC: 13 10*3/uL — ABNORMAL HIGH (ref 4.0–10.5)

## 2016-06-11 LAB — URINALYSIS, ROUTINE W REFLEX MICROSCOPIC
Bilirubin Urine: NEGATIVE
GLUCOSE, UA: 50 mg/dL — AB
KETONES UR: NEGATIVE mg/dL
NITRITE: POSITIVE — AB
PH: 6 (ref 5.0–8.0)
PROTEIN: 30 mg/dL — AB
Specific Gravity, Urine: 1.008 (ref 1.005–1.030)

## 2016-06-11 LAB — I-STAT CHEM 8, ED
BUN: 17 mg/dL (ref 6–20)
CALCIUM ION: 1.05 mmol/L — AB (ref 1.15–1.40)
CHLORIDE: 96 mmol/L — AB (ref 101–111)
Creatinine, Ser: 0.9 mg/dL (ref 0.44–1.00)
GLUCOSE: 142 mg/dL — AB (ref 65–99)
HCT: 39 % (ref 36.0–46.0)
Hemoglobin: 13.3 g/dL (ref 12.0–15.0)
Potassium: 3 mmol/L — ABNORMAL LOW (ref 3.5–5.1)
Sodium: 137 mmol/L (ref 135–145)
TCO2: 30 mmol/L (ref 0–100)

## 2016-06-11 LAB — I-STAT CG4 LACTIC ACID, ED: Lactic Acid, Venous: 1.57 mmol/L (ref 0.5–1.9)

## 2016-06-11 MED ORDER — TETANUS-DIPHTH-ACELL PERTUSSIS 5-2.5-18.5 LF-MCG/0.5 IM SUSP
0.5000 mL | Freq: Once | INTRAMUSCULAR | Status: AC
  Administered 2016-06-11: 0.5 mL via INTRAMUSCULAR
  Filled 2016-06-11: qty 0.5

## 2016-06-11 MED ORDER — ACETAMINOPHEN 650 MG RE SUPP: 650.0000 mg | Freq: Four times a day (QID) | RECTAL | Status: DC | PRN

## 2016-06-11 MED ORDER — DEXTROSE 5 % IV SOLN: 2.0000 g | Freq: Once | INTRAVENOUS | Status: DC

## 2016-06-11 MED ORDER — SODIUM CHLORIDE 0.9% FLUSH
3.0000 mL | Freq: Two times a day (BID) | INTRAVENOUS | Status: DC
  Administered 2016-06-12 – 2016-06-16 (×8): 3 mL via INTRAVENOUS

## 2016-06-11 MED ORDER — AMLODIPINE BESYLATE 5 MG PO TABS
5.0000 mg | ORAL_TABLET | Freq: Every day | ORAL | Status: DC
Start: 2016-06-12 — End: 2016-06-16
  Administered 2016-06-12 – 2016-06-16 (×5): 5 mg via ORAL
  Filled 2016-06-11 (×5): qty 1

## 2016-06-11 MED ORDER — APIXABAN 2.5 MG PO TABS
2.5000 mg | ORAL_TABLET | Freq: Two times a day (BID) | ORAL | Status: DC
Start: 2016-06-12 — End: 2016-06-16
  Administered 2016-06-12 – 2016-06-16 (×9): 2.5 mg via ORAL
  Filled 2016-06-11 (×9): qty 1

## 2016-06-11 MED ORDER — SODIUM CHLORIDE 0.9 % IV BOLUS (SEPSIS): 1000.0000 mL | Freq: Once | INTRAVENOUS | Status: DC

## 2016-06-11 MED ORDER — POTASSIUM CHLORIDE CRYS ER 20 MEQ PO TBCR
40.0000 meq | EXTENDED_RELEASE_TABLET | Freq: Once | ORAL | Status: AC
  Administered 2016-06-11: 40 meq via ORAL
  Filled 2016-06-11: qty 2

## 2016-06-11 MED ORDER — DEXTROSE 5 % IV SOLN
2.0000 g | INTRAVENOUS | Status: DC
  Administered 2016-06-12 – 2016-06-15 (×4): 2 g via INTRAVENOUS
  Filled 2016-06-11 (×4): qty 2

## 2016-06-11 MED ORDER — MIRTAZAPINE 30 MG PO TABS: 30.0000 mg | ORAL_TABLET | Freq: Every day | ORAL | Status: DC

## 2016-06-11 MED ORDER — TRAMADOL HCL 50 MG PO TABS: 50.0000 mg | ORAL_TABLET | Freq: Four times a day (QID) | ORAL | Status: DC | PRN

## 2016-06-11 MED ORDER — SODIUM CHLORIDE 0.9 % IV SOLN
Freq: Once | INTRAVENOUS | Status: AC
  Administered 2016-06-12: 75 mL/h via INTRAVENOUS

## 2016-06-11 MED ORDER — ACETAMINOPHEN 325 MG PO TABS
650.0000 mg | ORAL_TABLET | Freq: Four times a day (QID) | ORAL | Status: DC | PRN
  Administered 2016-06-12: 650 mg via ORAL
  Filled 2016-06-11: qty 2

## 2016-06-11 MED ORDER — ONDANSETRON HCL 4 MG/2ML IJ SOLN: 4.0000 mg | Freq: Four times a day (QID) | INTRAMUSCULAR | Status: DC | PRN

## 2016-06-11 MED ORDER — PIPERACILLIN-TAZOBACTAM 3.375 G IVPB 30 MIN
3.3750 g | Freq: Once | INTRAVENOUS | Status: AC
  Administered 2016-06-11: 3.375 g via INTRAVENOUS
  Filled 2016-06-11: qty 50

## 2016-06-11 MED ORDER — SERTRALINE HCL 100 MG PO TABS
100.0000 mg | ORAL_TABLET | Freq: Every day | ORAL | Status: DC
  Administered 2016-06-12 – 2016-06-16 (×5): 100 mg via ORAL
  Filled 2016-06-11 (×5): qty 1

## 2016-06-11 MED ORDER — LORAZEPAM 0.5 MG PO TABS: 0.5000 mg | ORAL_TABLET | Freq: Two times a day (BID) | ORAL | Status: DC | PRN

## 2016-06-11 MED ORDER — SODIUM CHLORIDE 0.9 % IV BOLUS (SEPSIS)
1000.0000 mL | Freq: Once | INTRAVENOUS | Status: AC
  Administered 2016-06-11: 1000 mL via INTRAVENOUS

## 2016-06-11 MED ORDER — ONDANSETRON HCL 4 MG PO TABS
4.0000 mg | ORAL_TABLET | Freq: Four times a day (QID) | ORAL | Status: DC | PRN
Start: 2016-06-11 — End: 2016-06-16

## 2016-06-11 MED ORDER — ALBUTEROL SULFATE (2.5 MG/3ML) 0.083% IN NEBU: 2.5000 mg | INHALATION_SOLUTION | RESPIRATORY_TRACT | Status: DC | PRN

## 2016-06-11 MED ORDER — MEMANTINE HCL 10 MG PO TABS
10.0000 mg | ORAL_TABLET | Freq: Two times a day (BID) | ORAL | Status: DC
  Administered 2016-06-12 – 2016-06-16 (×9): 10 mg via ORAL
  Filled 2016-06-11 (×10): qty 1

## 2016-06-11 MED ORDER — VANCOMYCIN HCL IN DEXTROSE 1-5 GM/200ML-% IV SOLN
1000.0000 mg | Freq: Once | INTRAVENOUS | Status: AC
  Administered 2016-06-11: 1000 mg via INTRAVENOUS
  Filled 2016-06-11: qty 200

## 2016-06-11 NOTE — ED Provider Notes (Signed)
[facility]-EMERGENCY DEPT Provider Note   CSN: [id number] Arrival date & time: 06/11/16  1954     History   Chief Complaint Chief Complaint  Patient presents with  . Fall    HPI Rebecca [name] is a 83 y.o. female.  HPI 83 year old female in elequis, with advanced dementia, presents with episode of syncope. Caregivers did not witness but state that they did not hear any noise except patient falling. Patient states that she passed out and does not remember the fall. She endorses only pain in her chin. She denies any infectious symptoms except wanting to go to bathroom. Patient denies any chest pain or shortness of breath. No cough or dysuria. Patient unable to provide much history due to underlying dementia.   Past Medical History:  Diagnosis Date  . Age-related macular degeneration, wet, right eye (HCC)   . Aortic stenosis   . Aortic stenosis   . Dementia    "slight"/daughter 12/02/2013  . Dementia with Lewy bodies   . Depression   . Heart block   . Heart murmur    "slight"  . High cholesterol   . Hypertension   . Panic attacks   . Paroxysmal atrial fibrillation (HCC)   . S/P TAVR (transcatheter aortic valve replacement) 02/02/2014   23 mm Edwards Sapien XT transcatheter heart valve placed via open right transfemoral approach  . Syncope 08/21/2012  . UTI (lower urinary tract infection)     Patient Active Problem List   Diagnosis Date Noted  . Sepsis secondary to UTI (HCC) 06/11/2016  . Moderate episode of recurrent major depressive disorder (HCC)   . Sleep-wake disorder   . Somnolence   . Decreased appetite   . Goals of care, counseling/discussion   . Palliative care by specialist   . Advance care planning   . Pre-syncope 04/16/2016  . Confusion 04/15/2016  . MDD (major depressive disorder), single episode, moderate (HCC) 07/04/2015  . S/P TAVR (transcatheter aortic valve replacement) 02/02/2014  . Severe aortic stenosis 02/02/2014  . Sinus tachycardia 12/04/2013  .  Sinus bradycardia 12/04/2013  . Encounter for therapeutic drug monitoring 06/17/2013  . Mild dementia 11/06/2012  . Paroxysmal atrial fibrillation (HCC)   . PAF (paroxysmal atrial fibrillation) (HCC) 10/03/2012  . Long term (current) use of anticoagulants 10/03/2012  . Syncope 08/21/2012  . Aortic stenosis, severe 07/09/2012  . Near syncope 07/08/2012  . UTI (lower urinary tract infection) 07/08/2012  . BRONCHIECTASIS 12/09/2007  . SINUSITIS, CHRONIC 07/07/2007  . BRONCHITIS, CHRONIC 07/07/2007  . HYPERLIPIDEMIA 06/09/2007  . Essential hypertension 06/09/2007  . ALLERGIC RHINITIS 06/09/2007  . OSTEOPOROSIS 06/09/2007    Past Surgical History:  Procedure Laterality Date  . CARDIAC CATHETERIZATION  04/2013  . EYE SURGERY     cataracts  . INTRAOPERATIVE TRANSESOPHAGEAL ECHOCARDIOGRAM N/A 02/02/2014   Procedure: INTRAOPERATIVE TRANSESOPHAGEAL ECHOCARDIOGRAM;  Surgeon: Tonny Bollman, MD;  Location: [facility] OR;  Service: Open Heart Surgery;  Laterality: N/A;  . LEFT AND RIGHT HEART CATHETERIZATION WITH CORONARY ANGIOGRAM N/A 05/25/2013   Procedure: LEFT AND RIGHT HEART CATHETERIZATION WITH CORONARY ANGIOGRAM;  Surgeon: [name] [name], MD;  Location: [facility] CATH LAB;  Service: Cardiovascular;  Laterality: N/A;  . TONSILLECTOMY    . TRANSCATHETER AORTIC VALVE REPLACEMENT, TRANSFEMORAL  02/02/2014   Edwards Sapien XT THV (size 23 mm, model # 9300TFX, serial # [id number])  . TRANSCATHETER AORTIC VALVE REPLACEMENT, TRANSFEMORAL N/A 02/02/2014   Procedure: TRANSCATHETER AORTIC VALVE REPLACEMENT, TRANSFEMORAL;  Surgeon: Tonny Bollman, MD;  Location: [facility] OR;  Service: Open  Heart Surgery;  Laterality: N/A;    OB History    No data available       Home Medications    Prior to Admission medications   Medication Sig Start Date End Date Taking? Authorizing Provider  amLODipine (NORVASC) 5 MG tablet Take 5 mg by mouth daily.   Yes Historical Provider, MD  ELIQUIS 2.5 MG TABS tablet TAKE 1 TABLET (2.5 MG  TOTAL) BY MOUTH 2 (TWO) TIMES DAILY. 02/06/16  Yes [name] [name], MD  LORazepam (ATIVAN) 0.5 MG tablet Take 0.5 mg by mouth 2 (two) times daily as needed for anxiety (agitation).  07/12/15  Yes Historical Provider, MD  memantine (NAMENDA) 10 MG tablet Take 1 tablet (10 mg total) by mouth 2 (two) times daily. 01/09/16  Yes Suanne Marker, MD  mirtazapine (REMERON) 30 MG tablet Take 30 mg by mouth at bedtime.   Yes Historical Provider, MD  sertraline (ZOLOFT) 100 MG tablet Take 100 mg by mouth daily.  07/20/15  Yes Historical Provider, MD  amLODipine (NORVASC) 2.5 MG tablet Take 1 tablet (2.5 mg total) by mouth daily. Patient not taking: Reported on 06/11/2016 12/26/15   [name] [name], MD  divalproex (DEPAKOTE) 500 MG DR tablet Take 1 tablet (500 mg total) by mouth every 12 (twelve) hours. Patient not taking: Reported on 06/11/2016 04/18/16   Joseph Art, DO  feeding supplement, ENSURE ENLIVE, (ENSURE ENLIVE) LIQD Take 237 mLs by mouth 2 (two) times daily between meals. Patient not taking: Reported on 06/11/2016 04/18/16   Joseph Art, DO  mirtazapine (REMERON) 45 MG tablet Take 1 tablet (45 mg total) by mouth at bedtime. Patient not taking: Reported on 06/11/2016 04/18/16   Joseph Art, DO    Family History Family History  Problem Relation Age of Onset  . Heart attack Mother 63  . Heart Problems Father   . Heart attack Father 31  . Cerebral aneurysm Sister 63  . Sudden death Brother 74    Social History Social History  Substance Use Topics  . Smoking status: Former Smoker    Packs/day: 0.40    Years: 30.00    Types: Cigarettes    Quit date: 04/30/1960  . Smokeless tobacco: Never Used  . Alcohol use No     Allergies   Patient has no known allergies.   Review of Systems Review of Systems  Unable to perform ROS: Dementia     Physical Exam Updated Vital Signs BP 175/79   Pulse 108   Temp 100.5 F (38.1 C) (Oral)   Resp 22   SpO2 96%   Physical Exam    Constitutional: She appears well-developed and well-nourished. No distress.  HENT:  Head: Normocephalic.  Chin with bruising and small hematoma on right side without tenderness along entire face, scalp, extremities, c/t/l spine, chest, pelvis. Full rom of all joints, no tenderness along extremities x4.  Able to bite to tongue depressor and break with me twisting No loose teeth  Eyes: Conjunctivae are normal.  Neck: Neck supple.  Cardiovascular: Regular rhythm and intact distal pulses.   tachycardic  Pulmonary/Chest: Effort normal and breath sounds normal. No respiratory distress.  Abdominal: Soft. There is no tenderness.  Musculoskeletal: She exhibits no edema.  Neurological: She is alert.  oreinted x2  Skin: Skin is warm and dry. Capillary refill takes less than 2 seconds.  Psychiatric: She has a normal mood and affect.  Nursing note and vitals reviewed.    ED Treatments / Results  Labs (all labs ordered are listed, but only abnormal results are displayed) Labs Reviewed  CBC WITH DIFFERENTIAL/PLATELET - Abnormal; Notable for the following:       Result Value   WBC 13.0 (*)    RDW 16.0 (*)    Platelets 123 (*)    Neutro Abs 10.6 (*)    Monocytes Absolute 1.1 (*)    All other components within normal limits  COMPREHENSIVE METABOLIC PANEL - Abnormal; Notable for the following:    Potassium 3.1 (*)    Chloride 98 (*)    Glucose, Bld 144 (*)    Calcium 8.7 (*)    Albumin 3.1 (*)    GFR calc non Af Amer 53 (*)    All other components within normal limits  URINALYSIS, ROUTINE W REFLEX MICROSCOPIC - Abnormal; Notable for the following:    APPearance HAZY (*)    Glucose, UA 50 (*)    Hgb urine dipstick MODERATE (*)    Protein, ur 30 (*)    Nitrite POSITIVE (*)    Leukocytes, UA LARGE (*)    Bacteria, UA MANY (*)    Squamous Epithelial / LPF 0-5 (*)    All other components within normal limits  I-STAT CHEM 8, ED - Abnormal; Notable for the following:    Potassium 3.0 (*)     Chloride 96 (*)    Glucose, Bld 142 (*)    Calcium, Ion 1.05 (*)    All other components within normal limits  CULTURE, BLOOD (ROUTINE X 2)  CULTURE, BLOOD (ROUTINE X 2)  URINE CULTURE  INFLUENZA PANEL BY PCR (TYPE A & B)  CBC  BASIC METABOLIC PANEL  LACTIC ACID, PLASMA  LACTIC ACID, PLASMA  I-STAT CHEM 8, ED  I-STAT TROPOININ, ED  I-STAT CG4 LACTIC ACID, ED  I-STAT CG4 LACTIC ACID, ED  I-STAT CG4 LACTIC ACID, ED    EKG  EKG Interpretation None       Radiology Dg Chest 2 View  Result Date: 06/11/2016 CLINICAL DATA:  Unwitnessed fall. Hematoma along the right-sided chin. EXAM: CHEST  2 VIEW COMPARISON:  04/15/2016 CXR FINDINGS: Top normal-sized cardiac silhouette. Status post TAVR. Aortic atherosclerosis. No acute pulmonary consolidation, effusion or pneumothorax. No overt pulmonary edema. Mild chronic interstitial change noted bilaterally with minimal right lower lobe bronchiectasis. No acute nor suspicious osseous abnormality. IMPRESSION: No active cardiopulmonary disease. Electronically Signed   By: Tollie Ethavid  Kwon M.D.   On: 06/11/2016 21:35   Ct Head Wo Contrast  Result Date: 06/11/2016 CLINICAL DATA:  Unwitnessed fall. Hematoma to the right side of the chin. History of dementia. No pain. EXAM: CT HEAD WITHOUT CONTRAST CT CERVICAL SPINE WITHOUT CONTRAST TECHNIQUE: Multidetector CT imaging of the head and cervical spine was performed following the standard protocol without intravenous contrast. Multiplanar CT image reconstructions of the cervical spine were also generated. COMPARISON:  MRI brain 04/17/2016. CT head 04/15/2016. CT cervical spine and head 12/02/2013. FINDINGS: CT HEAD FINDINGS Brain: Diffuse cerebral atrophy. Mild ventricular dilatation consistent with central atrophy. Low-attenuation changes throughout the deep white matter consistent with small vessel ischemia. No mass effect or midline shift. No abnormal extra-axial fluid collections. Gray-white matter junctions  are distinct. Basal cisterns are not effaced. No acute intracranial hemorrhage. Vascular: Calcifications in the carotid siphons and vertebrobasilar arteries. Skull: Normal. Negative for fracture or focal lesion. Sinuses/Orbits: Mucosal thickening in the ethmoid air cells and right maxillary antrum. Mastoid air cells are not opacified. Other: No significant changes since the previous study.  CT CERVICAL SPINE FINDINGS Alignment: Slight anterior subluxation of C3 on C4 with slight retrolisthesis of C4 on C5. This is likely degenerative and is unchanged since prior study. Normal alignment of the facet joints. C1-2 articulation appears intact. Skull base and vertebrae: Diffuse bone demineralization. No vertebral compression deformities. Coalition of the posterior elements at C2-4, likely congenital. No focal bone lesion or bone destruction. Visualized skull base appears intact. Soft tissues and spinal canal: No prevertebral fluid or swelling. No visible canal hematoma. Disc levels: Diffuse degenerative changes throughout the cervical spine with narrowed disc spaces and endplate hypertrophic changes throughout. Changes are most prominent at C4-5 and C5-6 levels. Degenerative changes throughout the facet joints. Upper chest: Fibrosis in the lung apices. Subcentimeter nodule in the right thyroid gland. Vascular calcifications. Other: None. IMPRESSION: No acute intracranial abnormalities. Chronic atrophy and small vessel ischemic changes. Alignment of the cervical spine is unchanged since previous studies. Diffuse degenerative changes and diffuse demineralization. No acute displaced fractures identified. Electronically Signed   By: Burman Nieves M.D.   On: 06/11/2016 22:03   Ct Cervical Spine Wo Contrast  Result Date: 06/11/2016 CLINICAL DATA:  Unwitnessed fall. Hematoma to the right side of the chin. History of dementia. No pain. EXAM: CT HEAD WITHOUT CONTRAST CT CERVICAL SPINE WITHOUT CONTRAST TECHNIQUE:  Multidetector CT imaging of the head and cervical spine was performed following the standard protocol without intravenous contrast. Multiplanar CT image reconstructions of the cervical spine were also generated. COMPARISON:  MRI brain 04/17/2016. CT head 04/15/2016. CT cervical spine and head 12/02/2013. FINDINGS: CT HEAD FINDINGS Brain: Diffuse cerebral atrophy. Mild ventricular dilatation consistent with central atrophy. Low-attenuation changes throughout the deep white matter consistent with small vessel ischemia. No mass effect or midline shift. No abnormal extra-axial fluid collections. Gray-white matter junctions are distinct. Basal cisterns are not effaced. No acute intracranial hemorrhage. Vascular: Calcifications in the carotid siphons and vertebrobasilar arteries. Skull: Normal. Negative for fracture or focal lesion. Sinuses/Orbits: Mucosal thickening in the ethmoid air cells and right maxillary antrum. Mastoid air cells are not opacified. Other: No significant changes since the previous study. CT CERVICAL SPINE FINDINGS Alignment: Slight anterior subluxation of C3 on C4 with slight retrolisthesis of C4 on C5. This is likely degenerative and is unchanged since prior study. Normal alignment of the facet joints. C1-2 articulation appears intact. Skull base and vertebrae: Diffuse bone demineralization. No vertebral compression deformities. Coalition of the posterior elements at C2-4, likely congenital. No focal bone lesion or bone destruction. Visualized skull base appears intact. Soft tissues and spinal canal: No prevertebral fluid or swelling. No visible canal hematoma. Disc levels: Diffuse degenerative changes throughout the cervical spine with narrowed disc spaces and endplate hypertrophic changes throughout. Changes are most prominent at C4-5 and C5-6 levels. Degenerative changes throughout the facet joints. Upper chest: Fibrosis in the lung apices. Subcentimeter nodule in the right thyroid gland.  Vascular calcifications. Other: None. IMPRESSION: No acute intracranial abnormalities. Chronic atrophy and small vessel ischemic changes. Alignment of the cervical spine is unchanged since previous studies. Diffuse degenerative changes and diffuse demineralization. No acute displaced fractures identified. Electronically Signed   By: Burman Nieves M.D.   On: 06/11/2016 22:03    Procedures Procedures (including critical care time)  Medications Ordered in ED Medications  potassium chloride SA (K-DUR,KLOR-CON) CR tablet 40 mEq (not administered)  LORazepam (ATIVAN) tablet 0.5 mg (not administered)  sertraline (ZOLOFT) tablet 100 mg (not administered)  mirtazapine (REMERON) tablet 30 mg (not administered)  amLODipine (NORVASC) tablet  5 mg (not administered)  apixaban (ELIQUIS) tablet 2.5 mg (not administered)  memantine (NAMENDA) tablet 10 mg (not administered)  sodium chloride flush (NS) 0.9 % injection 3 mL (not administered)  ondansetron (ZOFRAN) tablet 4 mg (not administered)    Or  ondansetron (ZOFRAN) injection 4 mg (not administered)  acetaminophen (TYLENOL) tablet 650 mg (not administered)    Or  acetaminophen (TYLENOL) suppository 650 mg (not administered)  albuterol (PROVENTIL) (2.5 MG/3ML) 0.083% nebulizer solution 2.5 mg (not administered)  cefTRIAXone (ROCEPHIN) 2 g in dextrose 5 % 50 mL IVPB (not administered)  traMADol (ULTRAM) tablet 50 mg (not administered)  0.9 %  sodium chloride infusion (not administered)  vancomycin (VANCOCIN) IVPB 1000 mg/200 mL premix (0 mg Intravenous Stopped 06/11/16 2316)  piperacillin-tazobactam (ZOSYN) IVPB 3.375 g (0 g Intravenous Stopped 06/11/16 2205)  Tdap (BOOSTRIX) injection 0.5 mL (0.5 mLs Intramuscular Given 06/11/16 2051)  sodium chloride 0.9 % bolus 1,000 mL (0 mLs Intravenous Stopped 06/11/16 2157)     Initial Impression / Assessment and Plan / ED Course  I have reviewed the triage vital signs and the nursing notes.  Pertinent labs  & imaging results that were available during my care of the patient were reviewed by me and considered in my medical decision making (see chart for details).     Code sepsis initially called due to tachycardia, fever, syncope.  Unknown source so empirically treated broadly CT head and CT C-spine without acute abnormality from the fall. No wound repair necessary. Face CT not necessarily given exam. Labs reviewed evidence of urinary tract infection. Given patient's syncope, altered mental status, sepsis related to urinary tract infection, will admit.  Lactic acid is negative, 1 L fluids given.  Final Cliniaal Impressions(s) / ED Diagnoses   Final diagnoses:  Syncope and collapse  Pyelonephritis    New Prescriptions New Prescriptions   No medications on file     Sidney Ace, MD 06/11/16 [id number]    Gerhard Munch, MD 06/12/16 0002

## 2016-06-11 NOTE — ED Notes (Signed)
I-stat troponin of 0.03 and CG-4 of 1.49 reported to Dr. Lauretta Chesteruch-Resident.

## 2016-06-11 NOTE — Progress Notes (Signed)
Pharmacy Antibiotic Note  Rebecca [name] is a 83 y.o. female admitted on 06/11/2016 with UTI with signs of sepsis.  Pharmacy has been consulted for Rocephin dosing.  Plan: Rocephin 2g IV Q24H; consider decreasing to 1g if signs of sepsis resolve.   Temp (24hrs), Avg:100.5 F (38.1 C), Min:100.5 F (38.1 C), Max:100.5 F (38.1 C)   Recent Labs Lab 06/11/16 2016 06/11/16 2053 06/11/16 2317  WBC 13.0*  --   --   CREATININE 0.94 0.90  --   LATICACIDVEN  --   --  1.57     No Known Allergies    Thank you for allowing pharmacy to be a part of this patient's care.  Rebecca [name], PharmD, BCPS  06/11/2016 11:39 PM

## 2016-06-11 NOTE — H&P (Addendum)
History and Physical    Rebecca [name] JWJ:[id number] DOB: November 12, 1925 DOA: 06/11/2016  Referring MD/NP/PA: Franklyn Lor, MD PCP: [name], MD  Patient coming from: [facility]  Chief Complaint: Fall  HPI: Rebecca [name] is a 83 y.o. female with medical history significant of HTN, HLD, dementia, s/p TAVR,  syncope, A.fib on Eliquis; who presents after having unwitnessed fall. History is difficult to obtain secondary to patient's dementia. Review or reports only notes that the fall was heard, but not seen. Patient fell on her face and complains of some right-sided chin pain. Patient is noted to have a previous history of syncope. Otherwise patient denies any chest pain, cough, shortness of breath, diarrhea, nausea, vomiting, or abdominal pain. Patient was just admitted to the hospital on 12/17 through 12/20 after having syncopal episode and was started on new seizure medication of Depakote. The patient's daughter notes that the patient had developed some kind reaction to the medication and the medication was discontinued by her PCP within 1 week of her starting it.  ED Course: On admission into the emergency department patient was seen to be febrile up to 100.49F, pulse up to 108, respirations 22, blood pressure 175/79, and O2 saturations maintained on RA. Lab work revealed WBC 13, platelets 123, potassium 3.1, chloride 98, glucose 144, and all other vitals relatively within normal limits. UA was positive for signs of infection. Patient had an argument started on empiric antibiotics of vancomycin and Zosyn as sepsis protocol was initiated. Lactic chest and an influenza screen were pending.  Review of Systems: As per HPI otherwise 10 point review of systems negative.   Past Medical History:  Diagnosis Date  . Age-related macular degeneration, wet, right eye (HCC)   . Aortic stenosis   . Aortic stenosis   . Dementia    "slight"/daughter 12/02/2013  . Dementia with Lewy bodies   .  Depression   . Heart block   . Heart murmur    "slight"  . High cholesterol   . Hypertension   . Panic attacks   . Paroxysmal atrial fibrillation (HCC)   . S/P TAVR (transcatheter aortic valve replacement) 02/02/2014   23 mm Edwards Sapien XT transcatheter heart valve placed via open right transfemoral approach  . Syncope 08/21/2012  . UTI (lower urinary tract infection)     Past Surgical History:  Procedure Laterality Date  . CARDIAC CATHETERIZATION  04/2013  . EYE SURGERY     cataracts  . INTRAOPERATIVE TRANSESOPHAGEAL ECHOCARDIOGRAM N/A 02/02/2014   Procedure: INTRAOPERATIVE TRANSESOPHAGEAL ECHOCARDIOGRAM;  Surgeon: Tonny Bollman, MD;  Location: [facility] OR;  Service: Open Heart Surgery;  Laterality: N/A;  . LEFT AND RIGHT HEART CATHETERIZATION WITH CORONARY ANGIOGRAM N/A 05/25/2013   Procedure: LEFT AND RIGHT HEART CATHETERIZATION WITH CORONARY ANGIOGRAM;  Surgeon: [name] [name], MD;  Location: [facility] CATH LAB;  Service: Cardiovascular;  Laterality: N/A;  . TONSILLECTOMY    . TRANSCATHETER AORTIC VALVE REPLACEMENT, TRANSFEMORAL  02/02/2014   Edwards Sapien XT THV (size 23 mm, model # 9300TFX, serial # [id number])  . TRANSCATHETER AORTIC VALVE REPLACEMENT, TRANSFEMORAL N/A 02/02/2014   Procedure: TRANSCATHETER AORTIC VALVE REPLACEMENT, TRANSFEMORAL;  Surgeon: Tonny Bollman, MD;  Location: [facility] OR;  Service: Open Heart Surgery;  Laterality: N/A;     reports that she quit smoking about 56 years ago. Her smoking use included Cigarettes. She has a 12.00 pack-year smoking history. She has never used smokeless tobacco. She reports that she does not drink alcohol or use drugs.  No Known  Allergies  Family History  Problem Relation Age of Onset  . Heart attack Mother [id number]  . Heart Problems Father   . Heart attack Father [id number]  . Cerebral aneurysm Sister [phone]  . Sudden death Brother [phone]    Prior to Admission medications   Medication Sig Start Date End Date Taking? Authorizing Provider  amLODipine  (NORVASC) 5 MG tablet Take 5 mg by mouth daily.   Yes Historical Provider, MD  ELIQUIS 2.5 MG TABS tablet TAKE 1 TABLET (2.5 MG TOTAL) BY MOUTH 2 (TWO) TIMES DAILY. 02/06/16  Yes [name] SwazilandJordan, MD  LORazepam (ATIVAN) 0.5 MG tablet Take 0.5 mg by mouth 2 (two) times daily as needed for anxiety (agitation).  07/12/15  Yes Historical Provider, MD  memantine (NAMENDA) 10 MG tablet Take 1 tablet (10 mg total) by mouth 2 (two) times daily. 01/09/16  Yes Suanne MarkerVikram R Penumalli, MD  mirtazapine (REMERON) 30 MG tablet Take 30 mg by mouth at bedtime.   Yes Historical Provider, MD  sertraline (ZOLOFT) 100 MG tablet Take 100 mg by mouth daily.  07/20/15  Yes Historical Provider, MD  amLODipine (NORVASC) 2.5 MG tablet Take 1 tablet (2.5 mg total) by mouth daily. Patient not taking: Reported on 06/11/2016 12/26/15   [name] SwazilandJordan, MD  divalproex (DEPAKOTE) 500 MG DR tablet Take 1 tablet (500 mg total) by mouth every 12 (twelve) hours. Patient not taking: Reported on 06/11/2016 04/18/16   Joseph ArtJessica U Vann, DO  feeding supplement, ENSURE ENLIVE, (ENSURE ENLIVE) LIQD Take 237 mLs by mouth 2 (two) times daily between meals. Patient not taking: Reported on 06/11/2016 04/18/16   Joseph ArtJessica U Vann, DO  mirtazapine (REMERON) 45 MG tablet Take 1 tablet (45 mg total) by mouth at bedtime. Patient not taking: Reported on 06/11/2016 04/18/16   Joseph ArtJessica U Vann, DO    Physical Exam:  Constitutional: Elderly female in NAD, calm, comfortable Vitals:   06/11/16 2000 06/11/16 2200  BP: 175/79 163/75  Pulse: 108 96  Resp: 22 16  Temp: 100.5 F (38.1 C)   TempSrc: Oral   SpO2: 96% 96%   Eyes: PERRL, lids and conjunctivae normal ENMT: Mucous membranes are moist. Posterior pharynx clear of any exudate or lesions. Neck: normal, supple, no masses, no thyromegaly Respiratory: clear to auscultation bilaterally, no wheezing, no crackles. Normal respiratory effort. No accessory muscle use.  Cardiovascular: Regular rate and rhythm, no murmurs  / rubs / gallops. No extremity edema. 2+ pedal pulses. No carotid bruits.  Abdomen: no tenderness, no masses palpated. No hepatosplenomegaly. Bowel sounds positive.  Musculoskeletal: no clubbing / cyanosis. No joint deformity upper and lower extremities. Good ROM, no contractures. Normal muscle tone.  Skin: Moderate-sized hematoma to the right side of the chin. Neurologic: CN 2-12 grossly intact. Sensation intact, DTR normal. Strength 5/5 in all 4.  Psychiatric: Alert and oriented x 2. Normal mood.     Labs on Admission: I have personally reviewed following labs and imaging studies  CBC:  Recent Labs Lab 06/11/16 2016 06/11/16 2053  WBC 13.0*  --   NEUTROABS 10.6*  --   HGB 12.7 13.3  HCT 38.1 39.0  MCV 84.9  --   PLT 123*  --    Basic Metabolic Panel:  Recent Labs Lab 06/11/16 2016 06/11/16 2053  NA 136 137  K 3.1* 3.0*  CL 98* 96*  CO2 26  --   GLUCOSE 144* 142*  BUN 16 17  CREATININE 0.94 0.90  CALCIUM 8.7*  --  GFR: CrCl cannot be calculated (Unknown ideal weight.). Liver Function Tests:  Recent Labs Lab 06/11/16 2016  AST 22  ALT 14  ALKPHOS 118  BILITOT 0.6  PROT 6.5  ALBUMIN 3.1*   No results for input(s): LIPASE, AMYLASE in the last 168 hours. No results for input(s): AMMONIA in the last 168 hours. Coagulation Profile: No results for input(s): INR, PROTIME in the last 168 hours. Cardiac Enzymes: No results for input(s): CKTOTAL, CKMB, CKMBINDEX, TROPONINI in the last 168 hours. BNP (last 3 results) No results for input(s): PROBNP in the last 8760 hours. HbA1C: No results for input(s): HGBA1C in the last 72 hours. CBG: No results for input(s): GLUCAP in the last 168 hours. Lipid Profile: No results for input(s): CHOL, HDL, LDLCALC, TRIG, CHOLHDL, LDLDIRECT in the last 72 hours. Thyroid Function Tests: No results for input(s): TSH, T4TOTAL, FREET4, T3FREE, THYROIDAB in the last 72 hours. Anemia Panel: No results for input(s): VITAMINB12,  FOLATE, FERRITIN, TIBC, IRON, RETICCTPCT in the last 72 hours. Urine analysis:    Component Value Date/Time   COLORURINE YELLOW 06/11/2016 2144   APPEARANCEUR HAZY (A) 06/11/2016 2144   LABSPEC 1.008 06/11/2016 2144   PHURINE 6.0 06/11/2016 2144   GLUCOSEU 50 (A) 06/11/2016 2144   HGBUR MODERATE (A) 06/11/2016 2144   BILIRUBINUR NEGATIVE 06/11/2016 2144   KETONESUR NEGATIVE 06/11/2016 2144   PROTEINUR 30 (A) 06/11/2016 2144   UROBILINOGEN 0.2 06/03/2014 0740   NITRITE POSITIVE (A) 06/11/2016 2144   LEUKOCYTESUR LARGE (A) 06/11/2016 2144   Sepsis Labs: No results found for this or any previous visit (from the past 240 hour(s)).   Radiological Exams on Admission: Dg Chest 2 View  Result Date: 06/11/2016 CLINICAL DATA:  Unwitnessed fall. Hematoma along the right-sided chin. EXAM: CHEST  2 VIEW COMPARISON:  04/15/2016 CXR FINDINGS: Top normal-sized cardiac silhouette. Status post TAVR. Aortic atherosclerosis. No acute pulmonary consolidation, effusion or pneumothorax. No overt pulmonary edema. Mild chronic interstitial change noted bilaterally with minimal right lower lobe bronchiectasis. No acute nor suspicious osseous abnormality. IMPRESSION: No active cardiopulmonary disease. Electronically Signed   By: Tollie Eth M.D.   On: 06/11/2016 21:35   Ct Head Wo Contrast  Result Date: 06/11/2016 CLINICAL DATA:  Unwitnessed fall. Hematoma to the right side of the chin. History of dementia. No pain. EXAM: CT HEAD WITHOUT CONTRAST CT CERVICAL SPINE WITHOUT CONTRAST TECHNIQUE: Multidetector CT imaging of the head and cervical spine was performed following the standard protocol without intravenous contrast. Multiplanar CT image reconstructions of the cervical spine were also generated. COMPARISON:  MRI brain 04/17/2016. CT head 04/15/2016. CT cervical spine and head 12/02/2013. FINDINGS: CT HEAD FINDINGS Brain: Diffuse cerebral atrophy. Mild ventricular dilatation consistent with central atrophy.  Low-attenuation changes throughout the deep white matter consistent with small vessel ischemia. No mass effect or midline shift. No abnormal extra-axial fluid collections. Gray-white matter junctions are distinct. Basal cisterns are not effaced. No acute intracranial hemorrhage. Vascular: Calcifications in the carotid siphons and vertebrobasilar arteries. Skull: Normal. Negative for fracture or focal lesion. Sinuses/Orbits: Mucosal thickening in the ethmoid air cells and right maxillary antrum. Mastoid air cells are not opacified. Other: No significant changes since the previous study. CT CERVICAL SPINE FINDINGS Alignment: Slight anterior subluxation of C3 on C4 with slight retrolisthesis of C4 on C5. This is likely degenerative and is unchanged since prior study. Normal alignment of the facet joints. C1-2 articulation appears intact. Skull base and vertebrae: Diffuse bone demineralization. No vertebral compression deformities. Coalition of the  posterior elements at C2-4, likely congenital. No focal bone lesion or bone destruction. Visualized skull base appears intact. Soft tissues and spinal canal: No prevertebral fluid or swelling. No visible canal hematoma. Disc levels: Diffuse degenerative changes throughout the cervical spine with narrowed disc spaces and endplate hypertrophic changes throughout. Changes are most prominent at C4-5 and C5-6 levels. Degenerative changes throughout the facet joints. Upper chest: Fibrosis in the lung apices. Subcentimeter nodule in the right thyroid gland. Vascular calcifications. Other: None. IMPRESSION: No acute intracranial abnormalities. Chronic atrophy and small vessel ischemic changes. Alignment of the cervical spine is unchanged since previous studies. Diffuse degenerative changes and diffuse demineralization. No acute displaced fractures identified. Electronically Signed   By: Burman Nieves M.D.   On: 06/11/2016 22:03   Ct Cervical Spine Wo Contrast  Result Date:  06/11/2016 CLINICAL DATA:  Unwitnessed fall. Hematoma to the right side of the chin. History of dementia. No pain. EXAM: CT HEAD WITHOUT CONTRAST CT CERVICAL SPINE WITHOUT CONTRAST TECHNIQUE: Multidetector CT imaging of the head and cervical spine was performed following the standard protocol without intravenous contrast. Multiplanar CT image reconstructions of the cervical spine were also generated. COMPARISON:  MRI brain 04/17/2016. CT head 04/15/2016. CT cervical spine and head 12/02/2013. FINDINGS: CT HEAD FINDINGS Brain: Diffuse cerebral atrophy. Mild ventricular dilatation consistent with central atrophy. Low-attenuation changes throughout the deep white matter consistent with small vessel ischemia. No mass effect or midline shift. No abnormal extra-axial fluid collections. Gray-white matter junctions are distinct. Basal cisterns are not effaced. No acute intracranial hemorrhage. Vascular: Calcifications in the carotid siphons and vertebrobasilar arteries. Skull: Normal. Negative for fracture or focal lesion. Sinuses/Orbits: Mucosal thickening in the ethmoid air cells and right maxillary antrum. Mastoid air cells are not opacified. Other: No significant changes since the previous study. CT CERVICAL SPINE FINDINGS Alignment: Slight anterior subluxation of C3 on C4 with slight retrolisthesis of C4 on C5. This is likely degenerative and is unchanged since prior study. Normal alignment of the facet joints. C1-2 articulation appears intact. Skull base and vertebrae: Diffuse bone demineralization. No vertebral compression deformities. Coalition of the posterior elements at C2-4, likely congenital. No focal bone lesion or bone destruction. Visualized skull base appears intact. Soft tissues and spinal canal: No prevertebral fluid or swelling. No visible canal hematoma. Disc levels: Diffuse degenerative changes throughout the cervical spine with narrowed disc spaces and endplate hypertrophic changes throughout. Changes  are most prominent at C4-5 and C5-6 levels. Degenerative changes throughout the facet joints. Upper chest: Fibrosis in the lung apices. Subcentimeter nodule in the right thyroid gland. Vascular calcifications. Other: None. IMPRESSION: No acute intracranial abnormalities. Chronic atrophy and small vessel ischemic changes. Alignment of the cervical spine is unchanged since previous studies. Diffuse degenerative changes and diffuse demineralization. No acute displaced fractures identified. Electronically Signed   By: Burman Nieves M.D.   On: 06/11/2016 22:03    EKG: Independently reviewed. Sinus tachycardia with LBBB  Assessment/Plan Sepsis secondary to UTI: Acute. Patient found to be febrile, tachycardic, tachypneic, and found to have positive UA. Patient was empirically started on vancomycin and Zosyn initially.  - Admit to a telemetry bed - Sepsis protocol initiated - Continue antibiotics of Rocephin - f/u Urine Culture - f/u lactic acid, influenza screen - tylenol prn fever  Syncope and collapse: Patient previously thought to possibly be having seizures for which she was started on Depakote. This appears to have been stopped shortly after her discharge as patient developed some kind of reaction. - Follow-up  telemetry overnight   PAF on anticoagulation: Chadsvasc score= 4 - continue Eliquis    Essential hypertension - Continue amlodipine   Depression/anxiety - Continue mirtazapine, Zoloft, Ativan prn  Aortic stenosis s/p TAVR  Dementia - Continue Namenda   DVT prophylaxis: on Eliquis Code Status: DNR Family Communication: no family present at bedside  Disposition Plan: Likley discharge back to [facility] once medically stable  Consults called: None  Admission status: Inpatient  Clydie Braun MD [facility] Pager [phone]  If 7PM-7AM, please contact night-coverage [email or web address] Password [email or web address]  06/11/2016, 10:50 PM

## 2016-06-11 NOTE — ED Triage Notes (Signed)
[facility] Pt had unwitnessed fall. Pt currently takes eliquis.  Pt has hematoma to R side of chin, no other injuries noted. Pt has hx dementia. Pt denies pain at this time. Pt currently in no acute distress.

## 2016-06-11 NOTE — ED Notes (Signed)
Pt transported to Xray. 

## 2016-06-12 LAB — BLOOD CULTURE ID PANEL (REFLEXED)
Acinetobacter baumannii: NOT DETECTED
CANDIDA GLABRATA: NOT DETECTED
CANDIDA TROPICALIS: NOT DETECTED
CARBAPENEM RESISTANCE: NOT DETECTED
Candida albicans: NOT DETECTED
Candida krusei: NOT DETECTED
Candida parapsilosis: NOT DETECTED
ENTEROCOCCUS SPECIES: NOT DETECTED
Enterobacter cloacae complex: NOT DETECTED
Enterobacteriaceae species: DETECTED — AB
Escherichia coli: DETECTED — AB
Haemophilus influenzae: NOT DETECTED
Klebsiella oxytoca: NOT DETECTED
Klebsiella pneumoniae: NOT DETECTED
LISTERIA MONOCYTOGENES: NOT DETECTED
NEISSERIA MENINGITIDIS: NOT DETECTED
PROTEUS SPECIES: NOT DETECTED
Pseudomonas aeruginosa: NOT DETECTED
SERRATIA MARCESCENS: NOT DETECTED
STAPHYLOCOCCUS AUREUS BCID: NOT DETECTED
STAPHYLOCOCCUS SPECIES: NOT DETECTED
STREPTOCOCCUS AGALACTIAE: NOT DETECTED
STREPTOCOCCUS PNEUMONIAE: NOT DETECTED
Streptococcus pyogenes: NOT DETECTED
Streptococcus species: NOT DETECTED

## 2016-06-12 LAB — BASIC METABOLIC PANEL
ANION GAP: 13 (ref 5–15)
BUN: 11 mg/dL (ref 6–20)
CALCIUM: 8.2 mg/dL — AB (ref 8.9–10.3)
CHLORIDE: 102 mmol/L (ref 101–111)
CO2: 24 mmol/L (ref 22–32)
Creatinine, Ser: 0.82 mg/dL (ref 0.44–1.00)
GFR calc non Af Amer: >60 mL/min (ref >60)
GLUCOSE: 140 mg/dL — AB (ref 65–99)
Potassium: 3.6 mmol/L (ref 3.5–5.1)
Sodium: 139 mmol/L (ref 135–145)

## 2016-06-12 LAB — CBC
HEMATOCRIT: 36.7 % (ref 36.0–46.0)
Hemoglobin: 11.7 g/dL — ABNORMAL LOW (ref 12.0–15.0)
MCH: 27.1 pg (ref 26.0–34.0)
MCHC: 31.9 g/dL (ref 30.0–36.0)
MCV: 85 fL (ref 78.0–100.0)
Platelets: 130 10*3/uL — ABNORMAL LOW (ref 150–400)
RBC: 4.32 MIL/uL (ref 3.87–5.11)
RDW: 16 % — AB (ref 11.5–15.5)
WBC: 11.9 10*3/uL — AB (ref 4.0–10.5)

## 2016-06-12 LAB — CG4 I-STAT (LACTIC ACID): LACTIC ACID, VENOUS: 1.49 mmol/L (ref 0.5–1.9)

## 2016-06-12 LAB — POCT I-STAT TROPONIN I: Troponin i, poc: 0.03 ng/mL (ref 0.00–0.08)

## 2016-06-12 LAB — LACTIC ACID, PLASMA
LACTIC ACID, VENOUS: 1.1 mmol/L (ref 0.5–1.9)
Lactic Acid, Venous: 2.3 mmol/L (ref 0.5–1.9)

## 2016-06-12 LAB — MRSA PCR SCREENING: MRSA BY PCR: NEGATIVE

## 2016-06-12 LAB — INFLUENZA PANEL BY PCR (TYPE A & B)
INFLAPCR: NEGATIVE
Influenza B By PCR: NEGATIVE

## 2016-06-12 MED ORDER — MIRTAZAPINE 30 MG PO TABS
30.0000 mg | ORAL_TABLET | Freq: Every day | ORAL | Status: DC
  Administered 2016-06-12 – 2016-06-15 (×4): 30 mg via ORAL
  Filled 2016-06-12 (×4): qty 1

## 2016-06-12 NOTE — Progress Notes (Signed)
Patient arrived to unit alert to self and place. Large  Hematoma to right lips jaw area blood cleaned from face and mouth care given small abrasion rt knee. Pt right hand is bruised and very tender to touch. Patient was placed on telemetry. Will continue to monitor, Ilean SkillVeronica Sybella Harnish LPN

## 2016-06-12 NOTE — Progress Notes (Signed)
PROGRESS NOTE  Rebecca RoyaltyJennie Lariccia ZOX:[id number] DOB: [birth year] DOA: 06/11/2016 PCP: Duane LopeAlan Ross, MD   LOS: 1 day   Brief Narrative: Rebecca [name] is an 83 y.o. female with medical history significant of HTN, HLD, dementia, AS s/p TAVR, syncope, A.fib on Eliquis; who was admitted after an unwitnessed fall with suspected syncope and collapse. On admission she was febrile, tachypneic, and tachycardiac with a UA positive for infection. She was empirically started on Zosyn and Vancomycin in ED as sepsis protocol was initiated. Currently on Rocephin for UTI.   Assessment & Plan: Principal Problem:   Sepsis secondary to UTI [facility](HCC) Active Problems:   Syncope   PAF (paroxysmal atrial fibrillation) (HCC)   Mild dementia   S/P TAVR (transcatheter aortic valve replacement)    Sepsis secondary to UTI - on admission, patient was febrile, tachycardiac, and tachypneic with a UA positive for infection, and decreased menattion. Lactic acid normalized with fluids - Started empirically on Zosyn and Vancomycin in the ED, transitioned to Ceftriaxone  - Urine culture pending. - Tylenol prn fever  Syncope - potentially in the setting of sepsis - Patient has history of syncopal episode on the pasty and was thought to have seizures. She was started on Depakote during a prior admission (12/17), but this was discontinued by her PCP due to adverse effects - Continue to monitor on telemetry.  PAF - Chronic. Asymptomatic. Continue anticoagulation with Eliquis. - currently in sinus  Essential Hypertension - Continue amlodipine.  Dementia - Continue Namenda.  S/P TAVR - Asymptomatic.    DVT prophylaxis: On Eliquis Code Status: DNR Family Communication: no family present at bedside. Disposition Plan: TBD  Consultants:   None  Antimicrobials:  Ceftriaxone 2/12 >>   Subjective: - no complaints, confused.  Objective: Vitals:   06/12/16 0000 06/12/16 0110 06/12/16 0421 06/12/16 0559  BP: 156/79 (!)  155/73 (!) 154/108 (!) 156/72  Pulse: 95 91 91 95  Resp: 17 18 18 18   Temp:  98.6 F (37 C) 98.5 F (36.9 C) (!) 100.5 F (38.1 C)  TempSrc:  Oral Oral Oral  SpO2: 96% 100% 97% 95%    Intake/Output Summary (Last 24 hours) at 06/12/16 1030 Last data filed at 06/11/16 2316  Gross per 24 hour  Intake             1200 ml  Output                0 ml  Net             1200 ml   There were no vitals filed for this visit.  Examination: Constitutional: NAD Vitals:   06/12/16 0000 06/12/16 0110 06/12/16 0421 06/12/16 0559  BP: 156/79 (!) 155/73 (!) 154/108 (!) 156/72  Pulse: 95 91 91 95  Resp: 17 18 18 18   Temp:  98.6 F (37 C) 98.5 F (36.9 C) (!) 100.5 F (38.1 C)  TempSrc:  Oral Oral Oral  SpO2: 96% 100% 97% 95%   Eyes: PERRL, lids and conjunctivae normal Respiratory: clear to auscultation bilaterally, no wheezing, no crackles.  Cardiovascular: Regular rate and rhythm, no murmurs / rubs / gallops.  Abdomen: no tenderness. Bowel sounds positive.  Musculoskeletal: no clubbing / cyanosis.  Skin: no rashes, lesions, ulcers. No induration Neurologic: non focal Psychiatric: Appears confused. Alert and oriented to person but not time and place.    Data Reviewed: I have personally reviewed following labs and imaging studies  CBC:  Recent Labs Lab 06/11/16 2016 06/11/16 2053  06/12/16 0149  WBC 13.0*  --  11.9*  NEUTROABS 10.6*  --   --   HGB 12.7 13.3 11.7*  HCT 38.1 39.0 36.7  MCV 84.9  --  85.0  PLT 123*  --  130*   Basic Metabolic Panel:  Recent Labs Lab 06/11/16 2016 06/11/16 2053 06/12/16 0149  NA 136 137 139  K 3.1* 3.0* 3.6  CL 98* 96* 102  CO2 26  --  24  GLUCOSE 144* 142* 140*  BUN 16 17 11   CREATININE 0.94 0.90 0.82  CALCIUM 8.7*  --  8.2*   GFR: CrCl cannot be calculated (Unknown ideal weight.). Liver Function Tests:  Recent Labs Lab 06/11/16 2016  AST 22  ALT 14  ALKPHOS 118  BILITOT 0.6  PROT 6.5  ALBUMIN 3.1*   Urine analysis:      Component Value Date/Time   COLORURINE YELLOW 06/11/2016 2144   APPEARANCEUR HAZY (A) 06/11/2016 2144   LABSPEC 1.008 06/11/2016 2144   PHURINE 6.0 06/11/2016 2144   GLUCOSEU 50 (A) 06/11/2016 2144   HGBUR MODERATE (A) 06/11/2016 2144   BILIRUBINUR NEGATIVE 06/11/2016 2144   KETONESUR NEGATIVE 06/11/2016 2144   PROTEINUR 30 (A) 06/11/2016 2144   UROBILINOGEN 0.2 06/03/2014 0740   NITRITE POSITIVE (A) 06/11/2016 2144   LEUKOCYTESUR LARGE (A) 06/11/2016 2144   Sepsis Labs: Invalid input(s): PROCALCITONIN, LACTICIDVEN  Recent Results (from the past 240 hour(s))  MRSA PCR Screening     Status: None   Collection Time: 06/12/16  1:41 AM  Result Value Ref Range Status   MRSA by PCR NEGATIVE NEGATIVE Final    Comment:        The GeneXpert MRSA Assay (FDA approved for NASAL specimens only), is one component of a comprehensive MRSA colonization surveillance program. It is not intended to diagnose MRSA infection nor to guide or monitor treatment for MRSA infections.     Radiology Studies: Dg Chest 2 View  Result Date: 06/11/2016 CLINICAL DATA:  Unwitnessed fall. Hematoma along the right-sided chin. EXAM: CHEST  2 VIEW COMPARISON:  04/15/2016 CXR FINDINGS: Top normal-sized cardiac silhouette. Status post TAVR. Aortic atherosclerosis. No acute pulmonary consolidation, effusion or pneumothorax. No overt pulmonary edema. Mild chronic interstitial change noted bilaterally with minimal right lower lobe bronchiectasis. No acute nor suspicious osseous abnormality. IMPRESSION: No active cardiopulmonary disease. Electronically Signed   By: Tollie Eth M.D.   On: 06/11/2016 21:35   Ct Head Wo Contrast  Result Date: 06/11/2016 CLINICAL DATA:  Unwitnessed fall. Hematoma to the right side of the chin. History of dementia. No pain. EXAM: CT HEAD WITHOUT CONTRAST CT CERVICAL SPINE WITHOUT CONTRAST TECHNIQUE: Multidetector CT imaging of the head and cervical spine was performed following the standard  protocol without intravenous contrast. Multiplanar CT image reconstructions of the cervical spine were also generated. COMPARISON:  MRI brain 04/17/2016. CT head 04/15/2016. CT cervical spine and head 12/02/2013. FINDINGS: CT HEAD FINDINGS Brain: Diffuse cerebral atrophy. Mild ventricular dilatation consistent with central atrophy. Low-attenuation changes throughout the deep white matter consistent with small vessel ischemia. No mass effect or midline shift. No abnormal extra-axial fluid collections. Gray-white matter junctions are distinct. Basal cisterns are not effaced. No acute intracranial hemorrhage. Vascular: Calcifications in the carotid siphons and vertebrobasilar arteries. Skull: Normal. Negative for fracture or focal lesion. Sinuses/Orbits: Mucosal thickening in the ethmoid air cells and right maxillary antrum. Mastoid air cells are not opacified. Other: No significant changes since the previous study. CT CERVICAL SPINE FINDINGS Alignment: Slight anterior  subluxation of C3 on C4 with slight retrolisthesis of C4 on C5. This is likely degenerative and is unchanged since prior study. Normal alignment of the facet joints. C1-2 articulation appears intact. Skull base and vertebrae: Diffuse bone demineralization. No vertebral compression deformities. Coalition of the posterior elements at C2-4, likely congenital. No focal bone lesion or bone destruction. Visualized skull base appears intact. Soft tissues and spinal canal: No prevertebral fluid or swelling. No visible canal hematoma. Disc levels: Diffuse degenerative changes throughout the cervical spine with narrowed disc spaces and endplate hypertrophic changes throughout. Changes are most prominent at C4-5 and C5-6 levels. Degenerative changes throughout the facet joints. Upper chest: Fibrosis in the lung apices. Subcentimeter nodule in the right thyroid gland. Vascular calcifications. Other: None. IMPRESSION: No acute intracranial abnormalities. Chronic  atrophy and small vessel ischemic changes. Alignment of the cervical spine is unchanged since previous studies. Diffuse degenerative changes and diffuse demineralization. No acute displaced fractures identified. Electronically Signed   By: Burman Nieves M.D.   On: 06/11/2016 22:03   Ct Cervical Spine Wo Contrast  Result Date: 06/11/2016 CLINICAL DATA:  Unwitnessed fall. Hematoma to the right side of the chin. History of dementia. No pain. EXAM: CT HEAD WITHOUT CONTRAST CT CERVICAL SPINE WITHOUT CONTRAST TECHNIQUE: Multidetector CT imaging of the head and cervical spine was performed following the standard protocol without intravenous contrast. Multiplanar CT image reconstructions of the cervical spine were also generated. COMPARISON:  MRI brain 04/17/2016. CT head 04/15/2016. CT cervical spine and head 12/02/2013. FINDINGS: CT HEAD FINDINGS Brain: Diffuse cerebral atrophy. Mild ventricular dilatation consistent with central atrophy. Low-attenuation changes throughout the deep white matter consistent with small vessel ischemia. No mass effect or midline shift. No abnormal extra-axial fluid collections. Gray-white matter junctions are distinct. Basal cisterns are not effaced. No acute intracranial hemorrhage. Vascular: Calcifications in the carotid siphons and vertebrobasilar arteries. Skull: Normal. Negative for fracture or focal lesion. Sinuses/Orbits: Mucosal thickening in the ethmoid air cells and right maxillary antrum. Mastoid air cells are not opacified. Other: No significant changes since the previous study. CT CERVICAL SPINE FINDINGS Alignment: Slight anterior subluxation of C3 on C4 with slight retrolisthesis of C4 on C5. This is likely degenerative and is unchanged since prior study. Normal alignment of the facet joints. C1-2 articulation appears intact. Skull base and vertebrae: Diffuse bone demineralization. No vertebral compression deformities. Coalition of the posterior elements at C2-4, likely  congenital. No focal bone lesion or bone destruction. Visualized skull base appears intact. Soft tissues and spinal canal: No prevertebral fluid or swelling. No visible canal hematoma. Disc levels: Diffuse degenerative changes throughout the cervical spine with narrowed disc spaces and endplate hypertrophic changes throughout. Changes are most prominent at C4-5 and C5-6 levels. Degenerative changes throughout the facet joints. Upper chest: Fibrosis in the lung apices. Subcentimeter nodule in the right thyroid gland. Vascular calcifications. Other: None. IMPRESSION: No acute intracranial abnormalities. Chronic atrophy and small vessel ischemic changes. Alignment of the cervical spine is unchanged since previous studies. Diffuse degenerative changes and diffuse demineralization. No acute displaced fractures identified. Electronically Signed   By: Burman Nieves M.D.   On: 06/11/2016 22:03   Scheduled Meds: . amLODipine  5 mg Oral Daily  . apixaban  2.5 mg Oral BID  . cefTRIAXone (ROCEPHIN)  IV  2 g Intravenous Q24H  . memantine  10 mg Oral BID  . mirtazapine  30 mg Oral QHS  . sertraline  100 mg Oral Daily  . sodium chloride flush  3  mL Intravenous Q12H   Continuous Infusions:  Pamella Pert, MD, PhD [facility] Pager [phone]  If 7PM-7AM, please contact night-coverage [email or web address] Password [email or web address] 06/12/2016, 10:30 AM

## 2016-06-12 NOTE — Progress Notes (Signed)
PHARMACY - PHYSICIAN COMMUNICATION CRITICAL VALUE ALERT - BLOOD CULTURE IDENTIFICATION (BCID)  Results for orders placed or performed during the hospital encounter of 06/11/16  Blood Culture ID Panel (Reflexed) (Collected: 06/11/2016  8:20 PM)  Result Value Ref Range   Enterococcus species NOT DETECTED NOT DETECTED   Listeria monocytogenes NOT DETECTED NOT DETECTED   Staphylococcus species NOT DETECTED NOT DETECTED   Staphylococcus aureus NOT DETECTED NOT DETECTED   Streptococcus species NOT DETECTED NOT DETECTED   Streptococcus agalactiae NOT DETECTED NOT DETECTED   Streptococcus pneumoniae NOT DETECTED NOT DETECTED   Streptococcus pyogenes NOT DETECTED NOT DETECTED   Acinetobacter baumannii NOT DETECTED NOT DETECTED   Enterobacteriaceae species DETECTED (A) NOT DETECTED   Enterobacter cloacae complex NOT DETECTED NOT DETECTED   Escherichia coli DETECTED (A) NOT DETECTED   Klebsiella oxytoca NOT DETECTED NOT DETECTED   Klebsiella pneumoniae NOT DETECTED NOT DETECTED   Proteus species NOT DETECTED NOT DETECTED   Serratia marcescens NOT DETECTED NOT DETECTED   Carbapenem resistance NOT DETECTED NOT DETECTED   Haemophilus influenzae NOT DETECTED NOT DETECTED   Neisseria meningitidis NOT DETECTED NOT DETECTED   Pseudomonas aeruginosa NOT DETECTED NOT DETECTED   Candida albicans NOT DETECTED NOT DETECTED   Candida glabrata NOT DETECTED NOT DETECTED   Candida krusei NOT DETECTED NOT DETECTED   Candida parapsilosis NOT DETECTED NOT DETECTED   Candida tropicalis NOT DETECTED NOT DETECTED    Name of physician (or Provider) Contacted: Gherge  Changes to prescribed antibiotics required: None, on Rocephin  Rebecca [name], Rebecca [name] 06/12/2016  3:57 PM

## 2016-06-12 NOTE — ED Notes (Signed)
Attempted report 

## 2016-06-13 DIAGNOSIS — R7881 Bacteremia: Secondary | ICD-10-CM

## 2016-06-13 DIAGNOSIS — A419 Sepsis, unspecified organism: Secondary | ICD-10-CM

## 2016-06-13 DIAGNOSIS — N390 Urinary tract infection, site not specified: Secondary | ICD-10-CM

## 2016-06-13 LAB — CBC
HEMATOCRIT: 36 % (ref 36.0–46.0)
HEMOGLOBIN: 11.4 g/dL — AB (ref 12.0–15.0)
MCH: 27.3 pg (ref 26.0–34.0)
MCHC: 31.7 g/dL (ref 30.0–36.0)
MCV: 86.1 fL (ref 78.0–100.0)
Platelets: 130 10*3/uL — ABNORMAL LOW (ref 150–400)
RBC: 4.18 MIL/uL (ref 3.87–5.11)
RDW: 16.4 % — ABNORMAL HIGH (ref 11.5–15.5)
WBC: 8.5 10*3/uL (ref 4.0–10.5)

## 2016-06-13 LAB — BASIC METABOLIC PANEL
ANION GAP: 11 (ref 5–15)
BUN: 8 mg/dL (ref 6–20)
CO2: 26 mmol/L (ref 22–32)
Calcium: 8.2 mg/dL — ABNORMAL LOW (ref 8.9–10.3)
Chloride: 102 mmol/L (ref 101–111)
Creatinine, Ser: 0.72 mg/dL (ref 0.44–1.00)
GFR calc Af Amer: >60 mL/min (ref >60)
GLUCOSE: 94 mg/dL (ref 65–99)
POTASSIUM: 3 mmol/L — AB (ref 3.5–5.1)
SODIUM: 139 mmol/L (ref 135–145)

## 2016-06-13 MED ORDER — LIDOCAINE HCL 2 % EX GEL
1.0000 "application " | Freq: Every day | CUTANEOUS | Status: DC
  Filled 2016-06-13: qty 5

## 2016-06-13 MED ORDER — BLISTEX MEDICATED EX OINT
TOPICAL_OINTMENT | CUTANEOUS | Status: DC | PRN
  Administered 2016-06-13: 12:00:00 via TOPICAL
  Filled 2016-06-13: qty 6.3

## 2016-06-13 MED ORDER — POTASSIUM CHLORIDE CRYS ER 20 MEQ PO TBCR
40.0000 meq | EXTENDED_RELEASE_TABLET | ORAL | Status: AC
  Administered 2016-06-13 (×2): 40 meq via ORAL
  Filled 2016-06-13 (×2): qty 2

## 2016-06-13 NOTE — Progress Notes (Signed)
PROGRESS NOTE    Rebecca [name]  UJW:[id number] DOB: [birth year] DOA: 06/11/2016 PCP: [name], MD     Brief Narrative:  Rebecca [name] is an 83 y.o. female with medical history significant of HTN, HLD, dementia, AS s/p TAVR, syncope, A.fib on Eliquis; who was admitted after an unwitnessed fall with suspected syncope and collapse. On admission she was febrile, tachypneic, and tachycardiac with a UA positive for infection. She was empirically started on Zosyn and Vancomycin in ED as sepsis protocol was initiated. Currently on Rocephin for UTI.    Assessment & Plan:   Principal Problem:   Sepsis secondary to UTI [facility]) Active Problems:   Syncope   PAF (paroxysmal atrial fibrillation) (HCC)   Mild dementia   S/P TAVR (transcatheter aortic valve replacement)  Sepsis secondary to E Coli bacteremia and UTI, POA  - Urine culture with E Coli, sensitivity pending - Blood culture 1 of 2 with E Coli, sensitivity pending - Continue Rocephin   Syncope - Potentially in the setting of sepsis - Patient has history of syncopal episode on the past and was thought to have seizures. She was started on Depakote during a prior admission (12/17), but this was discontinued by her PCP due to adverse effects - Continue to monitor on telemetry, will have PT eval once above process stable   PAF - Continue anticoagulation with Eliquis. - Currently in sinus  Essential Hypertension - Continue amlodipine  Dementia - Continue Namenda, remeron   Hypokalemia - Replace and trend    DVT prophylaxis: Eliquis Code Status: DNR Family Communication: spoke with daughter over the phone this morning  Disposition Plan: pending further stabilization   Consultants:   None  Procedures:   None  Antimicrobials:   Rocephin 2/12 >>>    Subjective: Patient without any complaints this morning. Denies any fevers, chest pain, cough, shortness of breath, nausea, vomiting, diarrhea, abdominal pain,  dysuria   Objective: Vitals:   06/12/16 1758 06/12/16 2110 06/13/16 0618 06/13/16 1214  BP:  (!) 121/54 (!) 162/94 140/71  Pulse:  71 92 79  Resp:   (!) 22 18  Temp: 98.9 F (37.2 C) 97.9 F (36.6 C) 98.1 F (36.7 C) 98.3 F (36.8 C)  TempSrc: Oral Oral Oral Oral  SpO2:  97% 98% 91%    Intake/Output Summary (Last 24 hours) at 06/13/16 1511 Last data filed at 06/13/16 0915  Gross per 24 hour  Intake              410 ml  Output                0 ml  Net              410 ml   There were no vitals filed for this visit.  Examination:  General exam: Appears calm and comfortable  Respiratory system: Clear to auscultation. Respiratory effort normal. Cardiovascular system: S1 & S2 heard, RRR. No JVD, murmurs, rubs, gallops or clicks. No pedal edema. Gastrointestinal system: Abdomen is nondistended, soft and nontender. No organomegaly or masses felt. Normal bowel sounds heard. Central nervous system: Alert and oriented. No focal neurological deficits. Extremities: Symmetric 5 x 5 power. Skin: No rashes, lesions or ulcers  Data Reviewed: I have personally reviewed following labs and imaging studies  CBC:  Recent Labs Lab 06/11/16 2016 06/11/16 2053 06/12/16 0149 06/13/16 0638  WBC 13.0*  --  11.9* 8.5  NEUTROABS 10.6*  --   --   --   HGB 12.7  13.3 11.7* 11.4*  HCT 38.1 39.0 36.7 36.0  MCV 84.9  --  85.0 86.1  PLT 123*  --  130* 130*   Basic Metabolic Panel:  Recent Labs Lab 06/11/16 2016 06/11/16 2053 06/12/16 0149 06/13/16 0638  NA 136 137 139 139  K 3.1* 3.0* 3.6 3.0*  CL 98* 96* 102 102  CO2 26  --  24 26  GLUCOSE 144* 142* 140* 94  BUN 16 17 11 8   CREATININE 0.94 0.90 0.82 0.72  CALCIUM 8.7*  --  8.2* 8.2*   GFR: CrCl cannot be calculated (Unknown ideal weight.). Liver Function Tests:  Recent Labs Lab 06/11/16 2016  AST 22  ALT 14  ALKPHOS 118  BILITOT 0.6  PROT 6.5  ALBUMIN 3.1*   No results for input(s): LIPASE, AMYLASE in the last 168  hours. No results for input(s): AMMONIA in the last 168 hours. Coagulation Profile: No results for input(s): INR, PROTIME in the last 168 hours. Cardiac Enzymes: No results for input(s): CKTOTAL, CKMB, CKMBINDEX, TROPONINI in the last 168 hours. BNP (last 3 results) No results for input(s): PROBNP in the last 8760 hours. HbA1C: No results for input(s): HGBA1C in the last 72 hours. CBG: No results for input(s): GLUCAP in the last 168 hours. Lipid Profile: No results for input(s): CHOL, HDL, LDLCALC, TRIG, CHOLHDL, LDLDIRECT in the last 72 hours. Thyroid Function Tests: No results for input(s): TSH, T4TOTAL, FREET4, T3FREE, THYROIDAB in the last 72 hours. Anemia Panel: No results for input(s): VITAMINB12, FOLATE, FERRITIN, TIBC, IRON, RETICCTPCT in the last 72 hours. Sepsis Labs:  Recent Labs Lab 06/11/16 2054 06/11/16 2317 06/11/16 2344 06/12/16 0149  LATICACIDVEN 1.49 1.57 2.3* 1.1    Recent Results (from the past 240 hour(s))  Blood Culture (routine x 2)     Status: Abnormal (Preliminary result)   Collection Time: 06/11/16  8:20 PM  Result Value Ref Range Status   Specimen Description BLOOD RIGHT HAND  Final   Special Requests AEROBIC BOTTLE ONLY 5CC  Final   Culture  Setup Time   Final    GRAM NEGATIVE RODS AEROBIC BOTTLE ONLY Organism ID to follow CRITICAL RESULT CALLED TO, READ BACK BY AND VERIFIED WITH: LHosie Poisson. Curran Pharm.D. 15:55 06/12/16 ([email or web address])    Culture ESCHERICHIA COLI (A)  Final   Report Status PENDING  Incomplete  Blood Culture ID Panel (Reflexed)     Status: Abnormal   Collection Time: 06/11/16  8:20 PM  Result Value Ref Range Status   Enterococcus species NOT DETECTED NOT DETECTED Final   Listeria monocytogenes NOT DETECTED NOT DETECTED Final   Staphylococcus species NOT DETECTED NOT DETECTED Final   Staphylococcus aureus NOT DETECTED NOT DETECTED Final   Streptococcus species NOT DETECTED NOT DETECTED Final   Streptococcus agalactiae NOT DETECTED NOT  DETECTED Final   Streptococcus pneumoniae NOT DETECTED NOT DETECTED Final   Streptococcus pyogenes NOT DETECTED NOT DETECTED Final   Acinetobacter baumannii NOT DETECTED NOT DETECTED Final   Enterobacteriaceae species DETECTED (A) NOT DETECTED Final    Comment: Enterobacteriaceae represent a large family of gram-negative bacteria, not a single organism. CRITICAL RESULT CALLED TO, READ BACK BY AND VERIFIED WITH: LHosie Poisson. Curran Pharm.D. 15:55 06/12/16 ([email or web address])    Enterobacter cloacae complex NOT DETECTED NOT DETECTED Final   Escherichia coli DETECTED (A) NOT DETECTED Final    Comment: CRITICAL RESULT CALLED TO, READ BACK BY AND VERIFIED WITH: LHosie Poisson. Curran Pharm.D. 15:55 06/12/16 ([email or web address])    Klebsiella oxytoca NOT DETECTED NOT DETECTED Final  Klebsiella pneumoniae NOT DETECTED NOT DETECTED Final   Proteus species NOT DETECTED NOT DETECTED Final   Serratia marcescens NOT DETECTED NOT DETECTED Final   Carbapenem resistance NOT DETECTED NOT DETECTED Final   Haemophilus influenzae NOT DETECTED NOT DETECTED Final   Neisseria meningitidis NOT DETECTED NOT DETECTED Final   Pseudomonas aeruginosa NOT DETECTED NOT DETECTED Final   Candida albicans NOT DETECTED NOT DETECTED Final   Candida glabrata NOT DETECTED NOT DETECTED Final   Candida krusei NOT DETECTED NOT DETECTED Final   Candida parapsilosis NOT DETECTED NOT DETECTED Final   Candida tropicalis NOT DETECTED NOT DETECTED Final  Blood Culture (routine x 2)     Status: None (Preliminary result)   Collection Time: 06/11/16  8:31 PM  Result Value Ref Range Status   Specimen Description BLOOD LEFT HAND  Final   Special Requests IN PEDIATRIC BOTTLE 4CC  Final   Culture NO GROWTH < 24 HOURS  Final   Report Status PENDING  Incomplete  Urine culture     Status: Abnormal (Preliminary result)   Collection Time: 06/11/16  9:44 PM  Result Value Ref Range Status   Specimen Description URINE, RANDOM  Final   Special Requests NONE  Final   Culture  >=100,000 COLONIES/mL ESCHERICHIA COLI (A)  Final   Report Status PENDING  Incomplete  MRSA PCR Screening     Status: None   Collection Time: 06/12/16  1:41 AM  Result Value Ref Range Status   MRSA by PCR NEGATIVE NEGATIVE Final    Comment:        The GeneXpert MRSA Assay (FDA approved for NASAL specimens only), is one component of a comprehensive MRSA colonization surveillance program. It is not intended to diagnose MRSA infection nor to guide or monitor treatment for MRSA infections.        Radiology Studies: Dg Chest 2 View  Result Date: 06/11/2016 CLINICAL DATA:  Unwitnessed fall. Hematoma along the right-sided chin. EXAM: CHEST  2 VIEW COMPARISON:  04/15/2016 CXR FINDINGS: Top normal-sized cardiac silhouette. Status post TAVR. Aortic atherosclerosis. No acute pulmonary consolidation, effusion or pneumothorax. No overt pulmonary edema. Mild chronic interstitial change noted bilaterally with minimal right lower lobe bronchiectasis. No acute nor suspicious osseous abnormality. IMPRESSION: No active cardiopulmonary disease. Electronically Signed   By: Tollie Eth M.D.   On: 06/11/2016 21:35   Ct Head Wo Contrast  Result Date: 06/11/2016 CLINICAL DATA:  Unwitnessed fall. Hematoma to the right side of the chin. History of dementia. No pain. EXAM: CT HEAD WITHOUT CONTRAST CT CERVICAL SPINE WITHOUT CONTRAST TECHNIQUE: Multidetector CT imaging of the head and cervical spine was performed following the standard protocol without intravenous contrast. Multiplanar CT image reconstructions of the cervical spine were also generated. COMPARISON:  MRI brain 04/17/2016. CT head 04/15/2016. CT cervical spine and head 12/02/2013. FINDINGS: CT HEAD FINDINGS Brain: Diffuse cerebral atrophy. Mild ventricular dilatation consistent with central atrophy. Low-attenuation changes throughout the deep white matter consistent with small vessel ischemia. No mass effect or midline shift. No abnormal extra-axial  fluid collections. Gray-white matter junctions are distinct. Basal cisterns are not effaced. No acute intracranial hemorrhage. Vascular: Calcifications in the carotid siphons and vertebrobasilar arteries. Skull: Normal. Negative for fracture or focal lesion. Sinuses/Orbits: Mucosal thickening in the ethmoid air cells and right maxillary antrum. Mastoid air cells are not opacified. Other: No significant changes since the previous study. CT CERVICAL SPINE FINDINGS Alignment: Slight anterior subluxation of C3 on C4 with slight retrolisthesis of C4 on C5. This  is likely degenerative and is unchanged since prior study. Normal alignment of the facet joints. C1-2 articulation appears intact. Skull base and vertebrae: Diffuse bone demineralization. No vertebral compression deformities. Coalition of the posterior elements at C2-4, likely congenital. No focal bone lesion or bone destruction. Visualized skull base appears intact. Soft tissues and spinal canal: No prevertebral fluid or swelling. No visible canal hematoma. Disc levels: Diffuse degenerative changes throughout the cervical spine with narrowed disc spaces and endplate hypertrophic changes throughout. Changes are most prominent at C4-5 and C5-6 levels. Degenerative changes throughout the facet joints. Upper chest: Fibrosis in the lung apices. Subcentimeter nodule in the right thyroid gland. Vascular calcifications. Other: None. IMPRESSION: No acute intracranial abnormalities. Chronic atrophy and small vessel ischemic changes. Alignment of the cervical spine is unchanged since previous studies. Diffuse degenerative changes and diffuse demineralization. No acute displaced fractures identified. Electronically Signed   By: Burman Nieves M.D.   On: 06/11/2016 22:03   Ct Cervical Spine Wo Contrast  Result Date: 06/11/2016 CLINICAL DATA:  Unwitnessed fall. Hematoma to the right side of the chin. History of dementia. No pain. EXAM: CT HEAD WITHOUT CONTRAST CT  CERVICAL SPINE WITHOUT CONTRAST TECHNIQUE: Multidetector CT imaging of the head and cervical spine was performed following the standard protocol without intravenous contrast. Multiplanar CT image reconstructions of the cervical spine were also generated. COMPARISON:  MRI brain 04/17/2016. CT head 04/15/2016. CT cervical spine and head 12/02/2013. FINDINGS: CT HEAD FINDINGS Brain: Diffuse cerebral atrophy. Mild ventricular dilatation consistent with central atrophy. Low-attenuation changes throughout the deep white matter consistent with small vessel ischemia. No mass effect or midline shift. No abnormal extra-axial fluid collections. Gray-white matter junctions are distinct. Basal cisterns are not effaced. No acute intracranial hemorrhage. Vascular: Calcifications in the carotid siphons and vertebrobasilar arteries. Skull: Normal. Negative for fracture or focal lesion. Sinuses/Orbits: Mucosal thickening in the ethmoid air cells and right maxillary antrum. Mastoid air cells are not opacified. Other: No significant changes since the previous study. CT CERVICAL SPINE FINDINGS Alignment: Slight anterior subluxation of C3 on C4 with slight retrolisthesis of C4 on C5. This is likely degenerative and is unchanged since prior study. Normal alignment of the facet joints. C1-2 articulation appears intact. Skull base and vertebrae: Diffuse bone demineralization. No vertebral compression deformities. Coalition of the posterior elements at C2-4, likely congenital. No focal bone lesion or bone destruction. Visualized skull base appears intact. Soft tissues and spinal canal: No prevertebral fluid or swelling. No visible canal hematoma. Disc levels: Diffuse degenerative changes throughout the cervical spine with narrowed disc spaces and endplate hypertrophic changes throughout. Changes are most prominent at C4-5 and C5-6 levels. Degenerative changes throughout the facet joints. Upper chest: Fibrosis in the lung apices.  Subcentimeter nodule in the right thyroid gland. Vascular calcifications. Other: None. IMPRESSION: No acute intracranial abnormalities. Chronic atrophy and small vessel ischemic changes. Alignment of the cervical spine is unchanged since previous studies. Diffuse degenerative changes and diffuse demineralization. No acute displaced fractures identified. Electronically Signed   By: Burman Nieves M.D.   On: 06/11/2016 22:03      Scheduled Meds: . amLODipine  5 mg Oral Daily  . apixaban  2.5 mg Oral BID  . cefTRIAXone (ROCEPHIN)  IV  2 g Intravenous Q24H  . memantine  10 mg Oral BID  . mirtazapine  30 mg Oral QHS  . sertraline  100 mg Oral Daily  . sodium chloride flush  3 mL Intravenous Q12H   Continuous Infusions:   LOS:  2 days    Time spent: 40 minutes   Noralee Stain, DO [facility] [email or web address] Password [email or web address] 06/13/2016, 3:11 PM

## 2016-06-14 DIAGNOSIS — R7881 Bacteremia: Secondary | ICD-10-CM

## 2016-06-14 LAB — CBC WITH DIFFERENTIAL/PLATELET
BASOS ABS: 0 10*3/uL (ref 0.0–0.1)
BASOS PCT: 0 %
EOS PCT: 2 %
Eosinophils Absolute: 0.1 10*3/uL (ref 0.0–0.7)
HEMATOCRIT: 36 % (ref 36.0–46.0)
Hemoglobin: 11.5 g/dL — ABNORMAL LOW (ref 12.0–15.0)
Lymphocytes Relative: 32 %
Lymphs Abs: 2 10*3/uL (ref 0.7–4.0)
MCH: 27.3 pg (ref 26.0–34.0)
MCHC: 31.9 g/dL (ref 30.0–36.0)
MCV: 85.3 fL (ref 78.0–100.0)
MONO ABS: 0.5 10*3/uL (ref 0.1–1.0)
Monocytes Relative: 8 %
NEUTROS ABS: 3.5 10*3/uL (ref 1.7–7.7)
Neutrophils Relative %: 58 %
PLATELETS: 146 10*3/uL — AB (ref 150–400)
RBC: 4.22 MIL/uL (ref 3.87–5.11)
RDW: 16.4 % — AB (ref 11.5–15.5)
WBC: 6.1 10*3/uL (ref 4.0–10.5)

## 2016-06-14 LAB — BASIC METABOLIC PANEL
ANION GAP: 9 (ref 5–15)
BUN: 6 mg/dL (ref 6–20)
CALCIUM: 8.5 mg/dL — AB (ref 8.9–10.3)
CO2: 27 mmol/L (ref 22–32)
Chloride: 103 mmol/L (ref 101–111)
Creatinine, Ser: 0.66 mg/dL (ref 0.44–1.00)
Glucose, Bld: 101 mg/dL — ABNORMAL HIGH (ref 65–99)
Potassium: 3.6 mmol/L (ref 3.5–5.1)
Sodium: 139 mmol/L (ref 135–145)

## 2016-06-14 LAB — URINE CULTURE

## 2016-06-14 NOTE — Progress Notes (Signed)
PROGRESS NOTE    [name]  ZOX:[id number] DOB: [birth year] DOA: 06/11/2016 PCP: [name], MD     Brief Narrative:  Rebecca [name] is an 83 y.o. female with medical history significant of HTN, HLD, dementia, AS s/p TAVR, syncope, A.fib on Eliquis; who was admitted after an unwitnessed fall with suspected syncope and collapse. On admission she was febrile, tachypneic, and tachycardiac with a UA positive for infection. She was empirically started on Zosyn and Vancomycin in ED as sepsis protocol was initiated. Currently on Rocephin for UTI. Blood culture came back for E coli as well.    Assessment & Plan:   Principal Problem:   E coli bacteremia Active Problems:   Syncope   PAF (paroxysmal atrial fibrillation) (HCC)   Mild dementia   S/P TAVR (transcatheter aortic valve replacement)   Sepsis secondary to UTI (HCC)  Sepsis secondary to E Coli bacteremia and UTI, POA  - Urine culture with E Coli, pansensitive  - Blood culture 1 of 2 with E Coli, sensitivity pending - Continue Rocephin  - Repeat blood culture to ensure clearance of bacteremia   Syncope - Potentially in the setting of sepsis - Patient has history of syncopal episode on the past and was thought to have seizures. She was started on Depakote during a prior admission (12/17), but this was discontinued by her PCP due to adverse effects - PT OT eval   PAF - Continue anticoagulation with Eliquis. - Currently in sinus  Essential Hypertension - Continue amlodipine  Dementia - Continue Namenda, remeron   Hypokalemia - Replace and trend    DVT prophylaxis: Eliquis Code Status: DNR Family Communication: spoke with daughter over the phone this morning   Disposition Plan: pending further stabilization   Consultants:   None  Procedures:   None  Antimicrobials:   Rocephin 2/12 >>>    Subjective: Patient without any complaints this morning. No acute events    Objective: Vitals:   06/13/16 1214  06/13/16 2224 06/14/16 0713 06/14/16 1005  BP: 140/71 127/64 (!) 157/89 140/72  Pulse: 79 93 92 84  Resp: 18 18 18    Temp: 98.3 F (36.8 C) 98.6 F (37 C) 99.1 F (37.3 C)   TempSrc: Oral Oral Oral   SpO2: 91% 97% 100% 99%    Intake/Output Summary (Last 24 hours) at 06/14/16 1307 Last data filed at 06/14/16 0533  Gross per 24 hour  Intake               50 ml  Output                0 ml  Net               50 ml   There were no vitals filed for this visit.  Examination:  General exam: Appears calm and comfortable  Respiratory system: Clear to auscultation. Respiratory effort normal. Cardiovascular system: S1 & S2 heard, RRR. No JVD, murmurs, rubs, gallops or clicks. No pedal edema. Gastrointestinal system: Abdomen is nondistended, soft and nontender. No organomegaly or masses felt. Normal bowel sounds heard. Central nervous system: Alert and oriented. No focal neurological deficits. Extremities: Symmetric 5 x 5 power. Skin: No rashes, lesions or ulcers. +brusing over right chin/cheek due to fall   Data Reviewed: I have personally reviewed following labs and imaging studies  CBC:  Recent Labs Lab 06/11/16 2016 06/11/16 2053 06/12/16 0149 06/13/16 0638 06/14/16 0508  WBC 13.0*  --  11.9* 8.5 6.1  NEUTROABS 10.6*  --   --   --  3.5  HGB 12.7 13.3 11.7* 11.4* 11.5*  HCT 38.1 39.0 36.7 36.0 36.0  MCV 84.9  --  85.0 86.1 85.3  PLT 123*  --  130* 130* 146*   Basic Metabolic Panel:  Recent Labs Lab 06/11/16 2016 06/11/16 2053 06/12/16 0149 06/13/16 0638 06/14/16 0508  NA 136 137 139 139 139  K 3.1* 3.0* 3.6 3.0* 3.6  CL 98* 96* 102 102 103  CO2 26  --  24 26 27   GLUCOSE 144* 142* 140* 94 101*  BUN 16 17 11 8 6   CREATININE 0.94 0.90 0.82 0.72 0.66  CALCIUM 8.7*  --  8.2* 8.2* 8.5*   GFR: CrCl cannot be calculated (Unknown ideal weight.). Liver Function Tests:  Recent Labs Lab 06/11/16 2016  AST 22  ALT 14  ALKPHOS 118  BILITOT 0.6  PROT 6.5  ALBUMIN  3.1*   No results for input(s): LIPASE, AMYLASE in the last 168 hours. No results for input(s): AMMONIA in the last 168 hours. Coagulation Profile: No results for input(s): INR, PROTIME in the last 168 hours. Cardiac Enzymes: No results for input(s): CKTOTAL, CKMB, CKMBINDEX, TROPONINI in the last 168 hours. BNP (last 3 results) No results for input(s): PROBNP in the last 8760 hours. HbA1C: No results for input(s): HGBA1C in the last 72 hours. CBG: No results for input(s): GLUCAP in the last 168 hours. Lipid Profile: No results for input(s): CHOL, HDL, LDLCALC, TRIG, CHOLHDL, LDLDIRECT in the last 72 hours. Thyroid Function Tests: No results for input(s): TSH, T4TOTAL, FREET4, T3FREE, THYROIDAB in the last 72 hours. Anemia Panel: No results for input(s): VITAMINB12, FOLATE, FERRITIN, TIBC, IRON, RETICCTPCT in the last 72 hours. Sepsis Labs:  Recent Labs Lab 06/11/16 2054 06/11/16 2317 06/11/16 2344 06/12/16 0149  LATICACIDVEN 1.49 1.57 2.3* 1.1    Recent Results (from the past 240 hour(s))  Blood Culture (routine x 2)     Status: Abnormal (Preliminary result)   Collection Time: 06/11/16  8:20 PM  Result Value Ref Range Status   Specimen Description BLOOD RIGHT HAND  Final   Special Requests AEROBIC BOTTLE ONLY 5CC  Final   Culture  Setup Time   Final    GRAM NEGATIVE RODS AEROBIC BOTTLE ONLY Organism ID to follow CRITICAL RESULT CALLED TO, READ BACK BY AND VERIFIED WITH: LHosie Poisson.D. 15:55 06/12/16 ([email or web address])    Culture ESCHERICHIA COLI SUSCEPTIBILITIES TO FOLLOW  (A)  Final   Report Status PENDING  Incomplete  Blood Culture ID Panel (Reflexed)     Status: Abnormal   Collection Time: 06/11/16  8:20 PM  Result Value Ref Range Status   Enterococcus species NOT DETECTED NOT DETECTED Final   Listeria monocytogenes NOT DETECTED NOT DETECTED Final   Staphylococcus species NOT DETECTED NOT DETECTED Final   Staphylococcus aureus NOT DETECTED NOT DETECTED Final    Streptococcus species NOT DETECTED NOT DETECTED Final   Streptococcus agalactiae NOT DETECTED NOT DETECTED Final   Streptococcus pneumoniae NOT DETECTED NOT DETECTED Final   Streptococcus pyogenes NOT DETECTED NOT DETECTED Final   Acinetobacter baumannii NOT DETECTED NOT DETECTED Final   Enterobacteriaceae species DETECTED (A) NOT DETECTED Final    Comment: Enterobacteriaceae represent a large family of gram-negative bacteria, not a single organism. CRITICAL RESULT CALLED TO, READ BACK BY AND VERIFIED WITH: LHosie Poisson.D. 15:55 06/12/16 ([email or web address])    Enterobacter cloacae complex NOT DETECTED NOT DETECTED Final   Escherichia coli DETECTED (A) NOT DETECTED Final    Comment: CRITICAL RESULT CALLED TO,  READ BACK BY AND VERIFIED WITH: LHosie Poisson. Curran Pharm.D. 15:55 06/12/16 ([email or web address])    Klebsiella oxytoca NOT DETECTED NOT DETECTED Final   Klebsiella pneumoniae NOT DETECTED NOT DETECTED Final   Proteus species NOT DETECTED NOT DETECTED Final   Serratia marcescens NOT DETECTED NOT DETECTED Final   Carbapenem resistance NOT DETECTED NOT DETECTED Final   Haemophilus influenzae NOT DETECTED NOT DETECTED Final   Neisseria meningitidis NOT DETECTED NOT DETECTED Final   Pseudomonas aeruginosa NOT DETECTED NOT DETECTED Final   Candida albicans NOT DETECTED NOT DETECTED Final   Candida glabrata NOT DETECTED NOT DETECTED Final   Candida krusei NOT DETECTED NOT DETECTED Final   Candida parapsilosis NOT DETECTED NOT DETECTED Final   Candida tropicalis NOT DETECTED NOT DETECTED Final  Blood Culture (routine x 2)     Status: None (Preliminary result)   Collection Time: 06/11/16  8:31 PM  Result Value Ref Range Status   Specimen Description BLOOD LEFT HAND  Final   Special Requests IN PEDIATRIC BOTTLE 4CC  Final   Culture NO GROWTH 2 DAYS  Final   Report Status PENDING  Incomplete  Urine culture     Status: Abnormal   Collection Time: 06/11/16  9:44 PM  Result Value Ref Range Status   Specimen  Description URINE, RANDOM  Final   Special Requests NONE  Final   Culture >=100,000 COLONIES/mL ESCHERICHIA COLI (A)  Final   Report Status 06/14/2016 FINAL  Final   Organism ID, Bacteria ESCHERICHIA COLI (A)  Final      Susceptibility   Escherichia coli - MIC*    AMPICILLIN 4 SENSITIVE Sensitive     CEFAZOLIN <=4 SENSITIVE Sensitive     CEFTRIAXONE <=1 SENSITIVE Sensitive     CIPROFLOXACIN <=0.25 SENSITIVE Sensitive     GENTAMICIN <=1 SENSITIVE Sensitive     IMIPENEM <=0.25 SENSITIVE Sensitive     NITROFURANTOIN <=16 SENSITIVE Sensitive     TRIMETH/SULFA <=20 SENSITIVE Sensitive     AMPICILLIN/SULBACTAM <=2 SENSITIVE Sensitive     PIP/TAZO <=4 SENSITIVE Sensitive     Extended ESBL NEGATIVE Sensitive     * >=100,000 COLONIES/mL ESCHERICHIA COLI  MRSA PCR Screening     Status: None   Collection Time: 06/12/16  1:41 AM  Result Value Ref Range Status   MRSA by PCR NEGATIVE NEGATIVE Final    Comment:        The GeneXpert MRSA Assay (FDA approved for NASAL specimens only), is one component of a comprehensive MRSA colonization surveillance program. It is not intended to diagnose MRSA infection nor to guide or monitor treatment for MRSA infections.        Radiology Studies: No results found.    Scheduled Meds: . amLODipine  5 mg Oral Daily  . apixaban  2.5 mg Oral BID  . cefTRIAXone (ROCEPHIN)  IV  2 g Intravenous Q24H  . memantine  10 mg Oral BID  . mirtazapine  30 mg Oral QHS  . sertraline  100 mg Oral Daily  . sodium chloride flush  3 mL Intravenous Q12H   Continuous Infusions:   LOS: 3 days    Time spent: 30 minutes   Noralee StainJennifer Najee Cowens, DO [facility] [email or web address] Password [email or web address] 06/14/2016, 1:07 PM

## 2016-06-14 NOTE — Clinical Social Work Note (Signed)
Clinical Social Work Assessment  Patient Details  Name: Rebecca RoyaltyJennie Clopper MRN: [id number] Date of Birth: 1925/11/10  Date of referral:  06/14/16               Reason for consult:  Discharge Planning                Permission sought to share information with:  Facility Medical sales representativeContact Representative, Family Supports Permission granted to share information::  Yes, Verbal Permission Granted  Name::     Corporate treasurerMary  Agency::  [facility]  Relationship::  Daughter  Contact Information:     Housing/Transportation Living arrangements for the past 2 months:  Assisted Living Facility Source of Information:  Patient Patient Interpreter Needed:  None Criminal Activity/Legal Involvement Pertinent to Current Situation/Hospitalization:  No - Comment as needed Significant Relationships:  Adult Children Lives with:  Facility Resident Do you feel safe going back to the place where you live?  Yes Need for family participation in patient care:  No (Coment)  Care giving concerns:  CSW received consult regarding discharge planning. Patient was sitting up on the side of the bed and was pleasant. [facility] would like to return at discharge. CSW to continue to follow and assist with discharge planning needs.   Social Worker assessment / plan:  CSW spoke with patient [facility] before [facility].  Employment status:  Retired Health and safety inspectornsurance information:  Medicare PT Recommendations:  Not assessed at this time Information / Referral to community resources:     Patient/Family's Response to care:  Patient would like to return to ALF at discharge. [facility]. They will come visit patient Friday to assess needs.   Patient/Family's Understanding of and Emotional Response to Diagnosis, Current Treatment, and Prognosis:   Patient expressed understanding of CSW role and discharge process. No questions/concerns about plan or treatment.     Emotional Assessment Appearance:  Appears stated age Attitude/Demeanor/Rapport:  Other (Appropriate) Affect (typically observed):  Accepting, Appropriate, Pleasant Orientation:  Oriented to Self, Oriented to Situation, Oriented to Place Alcohol / Substance use:  Not Applicable Psych involvement (Current and /or in the community):  No (Comment)  Discharge Needs  Concerns to be addressed:  Care Coordination Readmission within the last 30 days:  No Current discharge risk:  None Barriers to Discharge:  Continued Medical Work up   [employer] Incadia S Dyer Klug, LCSWA 06/14/2016, 4:04 PM

## 2016-06-15 LAB — CULTURE, BLOOD (ROUTINE X 2)

## 2016-06-15 LAB — CBC WITH DIFFERENTIAL/PLATELET
Basophils Absolute: 0 10*3/uL (ref 0.0–0.1)
Basophils Relative: 0 %
EOS PCT: 3 %
Eosinophils Absolute: 0.2 10*3/uL (ref 0.0–0.7)
HCT: 35.9 % — ABNORMAL LOW (ref 36.0–46.0)
Hemoglobin: 11.5 g/dL — ABNORMAL LOW (ref 12.0–15.0)
LYMPHS ABS: 2 10*3/uL (ref 0.7–4.0)
LYMPHS PCT: 30 %
MCH: 27.1 pg (ref 26.0–34.0)
MCHC: 32 g/dL (ref 30.0–36.0)
MCV: 84.7 fL (ref 78.0–100.0)
MONO ABS: 0.6 10*3/uL (ref 0.1–1.0)
Monocytes Relative: 9 %
Neutro Abs: 3.8 10*3/uL (ref 1.7–7.7)
Neutrophils Relative %: 58 %
PLATELETS: 172 10*3/uL (ref 150–400)
RBC: 4.24 MIL/uL (ref 3.87–5.11)
RDW: 16.2 % — AB (ref 11.5–15.5)
WBC: 6.5 10*3/uL (ref 4.0–10.5)

## 2016-06-15 LAB — BASIC METABOLIC PANEL
Anion gap: 10 (ref 5–15)
BUN: 7 mg/dL (ref 6–20)
CALCIUM: 8.7 mg/dL — AB (ref 8.9–10.3)
CO2: 27 mmol/L (ref 22–32)
CREATININE: 0.72 mg/dL (ref 0.44–1.00)
Chloride: 103 mmol/L (ref 101–111)
GFR calc Af Amer: >60 mL/min (ref >60)
GLUCOSE: 99 mg/dL (ref 65–99)
POTASSIUM: 3.5 mmol/L (ref 3.5–5.1)
SODIUM: 140 mmol/L (ref 135–145)

## 2016-06-15 MED ORDER — CEFUROXIME AXETIL 500 MG PO TABS
500.0000 mg | ORAL_TABLET | Freq: Two times a day (BID) | ORAL | Status: DC
  Administered 2016-06-16: 500 mg via ORAL
  Filled 2016-06-15 (×2): qty 1

## 2016-06-15 MED ORDER — CEFUROXIME AXETIL 500 MG PO TABS: 500.0000 mg | ORAL_TABLET | Freq: Two times a day (BID) | ORAL | 0 refills | Status: AC

## 2016-06-15 NOTE — Care Management Important Message (Signed)
Important Message  Patient Details  Name: Rebecca [name] MRN: [id number] Date of Birth: 1925-06-02   Medicare Important Message Given:  Yes    Kyla BalzarineShealy, Kyce Ging Abena 06/15/2016, 1:08 PM

## 2016-06-15 NOTE — Discharge Summary (Addendum)
Physician Discharge Summary  Rebecca [name] ZOX:[id number] DOB: 1925/10/20 DOA: 06/11/2016  PCP: [name], MD  Admit date: 06/11/2016 Discharge date: 06/16/2016  Admitted From: ALF Disposition:  ALF  Recommendations for Outpatient Follow-up:  1. Follow up with PCP in 1 week 2. Please follow up on the following pending results: final blood culture results   Home Health: No  Equipment/Devices: None   Discharge Condition: Stable CODE STATUS: DNR  Diet recommendation: Heart healthy   Brief/Interim Summary: Rebecca [name] is an 83 y.o. female with medical history significant of HTN, HLD, dementia, AS s/p TAVR, syncope, A.fib on Eliquis; who was admitted after an unwitnessed fall with suspected syncope and collapse.On admission she was febrile, tachypneic, and tachycardiac with aUA positive for infection. She was empirically started onZosyn and Vancomycin in ED as sepsis protocol was initiated then switched to Rocephin for UTI. Blood culture came back for E coli as well. Sensitivity returned with pansensitive and antibiotic was switched to ceftin. Repeat blood cultures were obtained, which is negative to date but pending final results at time of discharge. Labs continued to improve, she remained afebrile prior to discharge.    Discharge Diagnoses:  Principal Problem:   E coli bacteremia Active Problems:   Syncope   PAF (paroxysmal atrial fibrillation) (HCC)   Mild dementia   S/P TAVR (transcatheter aortic valve replacement)   Sepsis secondary to UTI (HCC)   Sepsis secondary to E Coli bacteremia and UTI, POA  - Urine culture with E Coli, pansensitive  - Blood culture 1 of 2 with E Coli, pansensitive  - Switch antibiotics to Ceftin and will continue this at discharge  - Repeat blood culture to ensure clearance of bacteremia - negative to date. Pending final culture results.   Syncope - Potentially in the setting of sepsis - Patient has history of syncopal episode on the past and was  thought to have seizures. She was started on Depakote during a prior admission (12/17), but this was discontinued by her PCP due to adverse effects - PT OT eval with high fall risk, able to ambulate 151ft but need assistance for stability   PAF - Continue anticoagulation with Eliquis. - Currently in sinus  Essential Hypertension - Continue amlodipine  Dementia - Continue Namenda, remeron   Discharge Instructions   Allergies as of 06/16/2016   No Known Allergies     Medication List    STOP taking these medications   divalproex 500 MG DR tablet Commonly known as:  DEPAKOTE     TAKE these medications   amLODipine 5 MG tablet Commonly known as:  NORVASC Take 5 mg by mouth daily. What changed:  Another medication with the same name was removed. Continue taking this medication, and follow the directions you see here.   cefUROXime 500 MG tablet Commonly known as:  CEFTIN Take 1 tablet (500 mg total) by mouth 2 (two) times daily with a meal.   ELIQUIS 2.5 MG Tabs tablet Generic drug:  apixaban TAKE 1 TABLET (2.5 MG TOTAL) BY MOUTH 2 (TWO) TIMES DAILY.   feeding supplement (ENSURE ENLIVE) Liqd Take 237 mLs by mouth 2 (two) times daily between meals.   LORazepam 0.5 MG tablet Commonly known as:  ATIVAN Take 0.5 mg by mouth 2 (two) times daily as needed for anxiety (agitation).   memantine 10 MG tablet Commonly known as:  NAMENDA Take 1 tablet (10 mg total) by mouth 2 (two) times daily.   mirtazapine 30 MG tablet Commonly known as:  REMERON  Take 30 mg by mouth at bedtime. What changed:  Another medication with the same name was removed. Continue taking this medication, and follow the directions you see here.   sertraline 100 MG tablet Commonly known as:  ZOLOFT Take 100 mg by mouth daily.      Follow-up Information    [name], MD. Schedule an appointment as soon as possible for a visit in 1 week(s).   Specialty:  Family Medicine Contact information: [address] [place] [address] [phone]          No Known Allergies  Consultations:  None   Procedures/Studies: Dg Chest 2 View  Result Date: 06/11/2016 CLINICAL DATA:  Unwitnessed fall. Hematoma along the right-sided chin. EXAM: CHEST  2 VIEW COMPARISON:  04/15/2016 CXR FINDINGS: Top normal-sized cardiac silhouette. Status post TAVR. Aortic atherosclerosis. No acute pulmonary consolidation, effusion or pneumothorax. No overt pulmonary edema. Mild chronic interstitial change noted bilaterally with minimal right lower lobe bronchiectasis. No acute nor suspicious osseous abnormality. IMPRESSION: No active cardiopulmonary disease. Electronically Signed   By: Tollie Eth M.D.   On: 06/11/2016 21:35   Ct Head Wo Contrast  Result Date: 06/11/2016 CLINICAL DATA:  Unwitnessed fall. Hematoma to the right side of the chin. History of dementia. No pain. EXAM: CT HEAD WITHOUT CONTRAST CT CERVICAL SPINE WITHOUT CONTRAST TECHNIQUE: Multidetector CT imaging of the head and cervical spine was performed following the standard protocol without intravenous contrast. Multiplanar CT image reconstructions of the cervical spine were also generated. COMPARISON:  MRI brain 04/17/2016. CT head 04/15/2016. CT cervical spine and head 12/02/2013. FINDINGS: CT HEAD FINDINGS Brain: Diffuse cerebral atrophy. Mild ventricular dilatation consistent with central atrophy. Low-attenuation changes throughout the deep white matter consistent with small vessel ischemia. No mass effect or midline shift. No abnormal extra-axial fluid collections. Gray-white matter junctions are distinct. Basal cisterns are not effaced. No acute intracranial hemorrhage. Vascular: Calcifications in the carotid siphons and vertebrobasilar arteries. Skull: Normal. Negative for fracture or focal lesion. Sinuses/Orbits: Mucosal thickening in the ethmoid air cells and right maxillary antrum. Mastoid air cells are not opacified. Other: No significant  changes since the previous study. CT CERVICAL SPINE FINDINGS Alignment: Slight anterior subluxation of C3 on C4 with slight retrolisthesis of C4 on C5. This is likely degenerative and is unchanged since prior study. Normal alignment of the facet joints. C1-2 articulation appears intact. Skull base and vertebrae: Diffuse bone demineralization. No vertebral compression deformities. Coalition of the posterior elements at C2-4, likely congenital. No focal bone lesion or bone destruction. Visualized skull base appears intact. Soft tissues and spinal canal: No prevertebral fluid or swelling. No visible canal hematoma. Disc levels: Diffuse degenerative changes throughout the cervical spine with narrowed disc spaces and endplate hypertrophic changes throughout. Changes are most prominent at C4-5 and C5-6 levels. Degenerative changes throughout the facet joints. Upper chest: Fibrosis in the lung apices. Subcentimeter nodule in the right thyroid gland. Vascular calcifications. Other: None. IMPRESSION: No acute intracranial abnormalities. Chronic atrophy and small vessel ischemic changes. Alignment of the cervical spine is unchanged since previous studies. Diffuse degenerative changes and diffuse demineralization. No acute displaced fractures identified. Electronically Signed   By: Burman Nieves M.D.   On: 06/11/2016 22:03   Ct Cervical Spine Wo Contrast  Result Date: 06/11/2016 CLINICAL DATA:  Unwitnessed fall. Hematoma to the right side of the chin. History of dementia. No pain. EXAM: CT HEAD WITHOUT CONTRAST CT CERVICAL SPINE WITHOUT CONTRAST TECHNIQUE: Multidetector CT imaging of the head  and cervical spine was performed following the standard protocol without intravenous contrast. Multiplanar CT image reconstructions of the cervical spine were also generated. COMPARISON:  MRI brain 04/17/2016. CT head 04/15/2016. CT cervical spine and head 12/02/2013. FINDINGS: CT HEAD FINDINGS Brain: Diffuse cerebral atrophy. Mild  ventricular dilatation consistent with central atrophy. Low-attenuation changes throughout the deep white matter consistent with small vessel ischemia. No mass effect or midline shift. No abnormal extra-axial fluid collections. Gray-white matter junctions are distinct. Basal cisterns are not effaced. No acute intracranial hemorrhage. Vascular: Calcifications in the carotid siphons and vertebrobasilar arteries. Skull: Normal. Negative for fracture or focal lesion. Sinuses/Orbits: Mucosal thickening in the ethmoid air cells and right maxillary antrum. Mastoid air cells are not opacified. Other: No significant changes since the previous study. CT CERVICAL SPINE FINDINGS Alignment: Slight anterior subluxation of C3 on C4 with slight retrolisthesis of C4 on C5. This is likely degenerative and is unchanged since prior study. Normal alignment of the facet joints. C1-2 articulation appears intact. Skull base and vertebrae: Diffuse bone demineralization. No vertebral compression deformities. Coalition of the posterior elements at C2-4, likely congenital. No focal bone lesion or bone destruction. Visualized skull base appears intact. Soft tissues and spinal canal: No prevertebral fluid or swelling. No visible canal hematoma. Disc levels: Diffuse degenerative changes throughout the cervical spine with narrowed disc spaces and endplate hypertrophic changes throughout. Changes are most prominent at C4-5 and C5-6 levels. Degenerative changes throughout the facet joints. Upper chest: Fibrosis in the lung apices. Subcentimeter nodule in the right thyroid gland. Vascular calcifications. Other: None. IMPRESSION: No acute intracranial abnormalities. Chronic atrophy and small vessel ischemic changes. Alignment of the cervical spine is unchanged since previous studies. Diffuse degenerative changes and diffuse demineralization. No acute displaced fractures identified. Electronically Signed   By: Burman Nieves M.D.   On: 06/11/2016  22:03       Discharge Exam: Vitals:   06/15/16 2225 06/16/16 0523  BP: (!) 149/72 139/71  Pulse: 82 89  Resp: 18 18  Temp: 98.5 F (36.9 C) 98.7 F (37.1 C)   Vitals:   06/15/16 0944 06/15/16 1420 06/15/16 2225 06/16/16 0523  BP: 114/80 123/70 (!) 149/72 139/71  Pulse:  83 82 89  Resp:   18 18  Temp:  98.3 F (36.8 C) 98.5 F (36.9 C) 98.7 F (37.1 C)  TempSrc:  Oral Oral Oral  SpO2:  98% 94% 96%    General: Pt is alert, awake, not in acute distress Cardiovascular: RRR, S1/S2 +, no rubs, no gallops Respiratory: CTA bilaterally, no wheezing, no rhonchi Abdominal: Soft, NT, ND, bowel sounds + Extremities: no edema, no cyanosis    The results of significant diagnostics from this hospitalization (including imaging, microbiology, ancillary and laboratory) are listed below for reference.     Microbiology: Recent Results (from the past 240 hour(s))  Blood Culture (routine x 2)     Status: Abnormal   Collection Time: 06/11/16  8:20 PM  Result Value Ref Range Status   Specimen Description BLOOD RIGHT HAND  Final   Special Requests AEROBIC BOTTLE ONLY 5CC  Final   Culture  Setup Time   Final    GRAM NEGATIVE RODS AEROBIC BOTTLE ONLY Organism ID to follow CRITICAL RESULT CALLED TO, READ BACK BY AND VERIFIED WITH: LHosie Poisson.D. 15:55 06/12/16 ([email or web address])    Culture ESCHERICHIA COLI (A)  Final   Report Status 06/15/2016 FINAL  Final   Organism ID, Bacteria ESCHERICHIA COLI  Final  Susceptibility   Escherichia coli - MIC*    AMPICILLIN 4 SENSITIVE Sensitive     CEFAZOLIN <=4 SENSITIVE Sensitive     CEFEPIME <=1 SENSITIVE Sensitive     CEFTAZIDIME <=1 SENSITIVE Sensitive     CEFTRIAXONE <=1 SENSITIVE Sensitive     CIPROFLOXACIN <=0.25 SENSITIVE Sensitive     GENTAMICIN <=1 SENSITIVE Sensitive     IMIPENEM <=0.25 SENSITIVE Sensitive     TRIMETH/SULFA <=20 SENSITIVE Sensitive     AMPICILLIN/SULBACTAM <=2 SENSITIVE Sensitive     PIP/TAZO <=4 SENSITIVE Sensitive      Extended ESBL NEGATIVE Sensitive     * ESCHERICHIA COLI  Blood Culture ID Panel (Reflexed)     Status: Abnormal   Collection Time: 06/11/16  8:20 PM  Result Value Ref Range Status   Enterococcus species NOT DETECTED NOT DETECTED Final   Listeria monocytogenes NOT DETECTED NOT DETECTED Final   Staphylococcus species NOT DETECTED NOT DETECTED Final   Staphylococcus aureus NOT DETECTED NOT DETECTED Final   Streptococcus species NOT DETECTED NOT DETECTED Final   Streptococcus agalactiae NOT DETECTED NOT DETECTED Final   Streptococcus pneumoniae NOT DETECTED NOT DETECTED Final   Streptococcus pyogenes NOT DETECTED NOT DETECTED Final   Acinetobacter baumannii NOT DETECTED NOT DETECTED Final   Enterobacteriaceae species DETECTED (A) NOT DETECTED Final    Comment: Enterobacteriaceae represent a large family of gram-negative bacteria, not a single organism. CRITICAL RESULT CALLED TO, READ BACK BY AND VERIFIED WITH: LHosie Poisson. Curran Pharm.D. 15:55 06/12/16 ([email or web address])    Enterobacter cloacae complex NOT DETECTED NOT DETECTED Final   Escherichia coli DETECTED (A) NOT DETECTED Final    Comment: CRITICAL RESULT CALLED TO, READ BACK BY AND VERIFIED WITH: LHosie Poisson. Curran Pharm.D. 15:55 06/12/16 ([email or web address])    Klebsiella oxytoca NOT DETECTED NOT DETECTED Final   Klebsiella pneumoniae NOT DETECTED NOT DETECTED Final   Proteus species NOT DETECTED NOT DETECTED Final   Serratia marcescens NOT DETECTED NOT DETECTED Final   Carbapenem resistance NOT DETECTED NOT DETECTED Final   Haemophilus influenzae NOT DETECTED NOT DETECTED Final   Neisseria meningitidis NOT DETECTED NOT DETECTED Final   Pseudomonas aeruginosa NOT DETECTED NOT DETECTED Final   Candida albicans NOT DETECTED NOT DETECTED Final   Candida glabrata NOT DETECTED NOT DETECTED Final   Candida krusei NOT DETECTED NOT DETECTED Final   Candida parapsilosis NOT DETECTED NOT DETECTED Final   Candida tropicalis NOT DETECTED NOT DETECTED Final  Blood Culture  (routine x 2)     Status: None (Preliminary result)   Collection Time: 06/11/16  8:31 PM  Result Value Ref Range Status   Specimen Description BLOOD LEFT HAND  Final   Special Requests IN PEDIATRIC BOTTLE 4CC  Final   Culture NO GROWTH 4 DAYS  Final   Report Status PENDING  Incomplete  Urine culture     Status: Abnormal   Collection Time: 06/11/16  9:44 PM  Result Value Ref Range Status   Specimen Description URINE, RANDOM  Final   Special Requests NONE  Final   Culture >=100,000 COLONIES/mL ESCHERICHIA COLI (A)  Final   Report Status 06/14/2016 FINAL  Final   Organism ID, Bacteria ESCHERICHIA COLI (A)  Final      Susceptibility   Escherichia coli - MIC*    AMPICILLIN 4 SENSITIVE Sensitive     CEFAZOLIN <=4 SENSITIVE Sensitive     CEFTRIAXONE <=1 SENSITIVE Sensitive     CIPROFLOXACIN <=0.25 SENSITIVE Sensitive     GENTAMICIN <=1 SENSITIVE Sensitive  IMIPENEM <=0.25 SENSITIVE Sensitive     NITROFURANTOIN <=16 SENSITIVE Sensitive     TRIMETH/SULFA <=20 SENSITIVE Sensitive     AMPICILLIN/SULBACTAM <=2 SENSITIVE Sensitive     PIP/TAZO <=4 SENSITIVE Sensitive     Extended ESBL NEGATIVE Sensitive     * >=100,000 COLONIES/mL ESCHERICHIA COLI  MRSA PCR Screening     Status: None   Collection Time: 06/12/16  1:41 AM  Result Value Ref Range Status   MRSA by PCR NEGATIVE NEGATIVE Final    Comment:        The GeneXpert MRSA Assay (FDA approved for NASAL specimens only), is one component of a comprehensive MRSA colonization surveillance program. It is not intended to diagnose MRSA infection nor to guide or monitor treatment for MRSA infections.   Culture, blood (routine x 2)     Status: None (Preliminary result)   Collection Time: 06/14/16 12:37 PM  Result Value Ref Range Status   Specimen Description BLOOD RIGHT ANTECUBITAL  Final   Special Requests IN PEDIATRIC BOTTLE 3 CC  Final   Culture NO GROWTH < 24 HOURS  Final   Report Status PENDING  Incomplete  Culture, blood  (routine x 2)     Status: None (Preliminary result)   Collection Time: 06/14/16 12:38 PM  Result Value Ref Range Status   Specimen Description BLOOD RIGHT HAND  Final   Special Requests IN PEDIATRIC BOTTLE 2CC  Final   Culture NO GROWTH < 24 HOURS  Final   Report Status PENDING  Incomplete     Labs: BNP (last 3 results) No results for input(s): BNP in the last 8760 hours. Basic Metabolic Panel:  Recent Labs Lab 06/12/16 0149 06/13/16 2130 06/14/16 0508 06/15/16 0412 06/16/16 0558  NA 139 139 139 140 143  K 3.6 3.0* 3.6 3.5 3.4*  CL 102 102 103 103 104  CO2 24 26 27 27 28   GLUCOSE 140* 94 101* 99 98  BUN 11 8 6 7 8   CREATININE 0.82 0.72 0.66 0.72 0.75  CALCIUM 8.2* 8.2* 8.5* 8.7* 8.9   Liver Function Tests:  Recent Labs Lab 06/11/16 2016  AST 22  ALT 14  ALKPHOS 118  BILITOT 0.6  PROT 6.5  ALBUMIN 3.1*   No results for input(s): LIPASE, AMYLASE in the last 168 hours. No results for input(s): AMMONIA in the last 168 hours. CBC:  Recent Labs Lab 06/11/16 2016  06/12/16 0149 06/13/16 8657 06/14/16 0508 06/15/16 0412 06/16/16 0558  WBC 13.0*  --  11.9* 8.5 6.1 6.5 7.3  NEUTROABS 10.6*  --   --   --  3.5 3.8 4.2  HGB 12.7  < > 11.7* 11.4* 11.5* 11.5* 12.0  HCT 38.1  < > 36.7 36.0 36.0 35.9* 37.7  MCV 84.9  --  85.0 86.1 85.3 84.7 84.9  PLT 123*  --  130* 130* 146* 172 202  < > = values in this interval not displayed. Cardiac Enzymes: No results for input(s): CKTOTAL, CKMB, CKMBINDEX, TROPONINI in the last 168 hours. BNP: Invalid input(s): POCBNP CBG: No results for input(s): GLUCAP in the last 168 hours. D-Dimer No results for input(s): DDIMER in the last 72 hours. Hgb A1c No results for input(s): HGBA1C in the last 72 hours. Lipid Profile No results for input(s): CHOL, HDL, LDLCALC, TRIG, CHOLHDL, LDLDIRECT in the last 72 hours. Thyroid function studies No results for input(s): TSH, T4TOTAL, T3FREE, THYROIDAB in the last 72 hours.  Invalid  input(s): FREET3 Anemia work up No results  for input(s): VITAMINB12, FOLATE, FERRITIN, TIBC, IRON, RETICCTPCT in the last 72 hours. Urinalysis    Component Value Date/Time   COLORURINE YELLOW 06/11/2016 2144   APPEARANCEUR HAZY (A) 06/11/2016 2144   LABSPEC 1.008 06/11/2016 2144   PHURINE 6.0 06/11/2016 2144   GLUCOSEU 50 (A) 06/11/2016 2144   HGBUR MODERATE (A) 06/11/2016 2144   BILIRUBINUR NEGATIVE 06/11/2016 2144   KETONESUR NEGATIVE 06/11/2016 2144   PROTEINUR 30 (A) 06/11/2016 2144   UROBILINOGEN 0.2 06/03/2014 0740   NITRITE POSITIVE (A) 06/11/2016 2144   LEUKOCYTESUR LARGE (A) 06/11/2016 2144   Sepsis Labs Invalid input(s): PROCALCITONIN,  WBC,  LACTICIDVEN Microbiology Recent Results (from the past 240 hour(s))  Blood Culture (routine x 2)     Status: Abnormal   Collection Time: 06/11/16  8:20 PM  Result Value Ref Range Status   Specimen Description BLOOD RIGHT HAND  Final   Special Requests AEROBIC BOTTLE ONLY 5CC  Final   Culture  Setup Time   Final    GRAM NEGATIVE RODS AEROBIC BOTTLE ONLY Organism ID to follow CRITICAL RESULT CALLED TO, READ BACK BY AND VERIFIED WITH: LHosie Poisson.D. 15:55 06/12/16 ([email or web address])    Culture ESCHERICHIA COLI (A)  Final   Report Status 06/15/2016 FINAL  Final   Organism ID, Bacteria ESCHERICHIA COLI  Final      Susceptibility   Escherichia coli - MIC*    AMPICILLIN 4 SENSITIVE Sensitive     CEFAZOLIN <=4 SENSITIVE Sensitive     CEFEPIME <=1 SENSITIVE Sensitive     CEFTAZIDIME <=1 SENSITIVE Sensitive     CEFTRIAXONE <=1 SENSITIVE Sensitive     CIPROFLOXACIN <=0.25 SENSITIVE Sensitive     GENTAMICIN <=1 SENSITIVE Sensitive     IMIPENEM <=0.25 SENSITIVE Sensitive     TRIMETH/SULFA <=20 SENSITIVE Sensitive     AMPICILLIN/SULBACTAM <=2 SENSITIVE Sensitive     PIP/TAZO <=4 SENSITIVE Sensitive     Extended ESBL NEGATIVE Sensitive     * ESCHERICHIA COLI  Blood Culture ID Panel (Reflexed)     Status: Abnormal   Collection Time:  06/11/16  8:20 PM  Result Value Ref Range Status   Enterococcus species NOT DETECTED NOT DETECTED Final   Listeria monocytogenes NOT DETECTED NOT DETECTED Final   Staphylococcus species NOT DETECTED NOT DETECTED Final   Staphylococcus aureus NOT DETECTED NOT DETECTED Final   Streptococcus species NOT DETECTED NOT DETECTED Final   Streptococcus agalactiae NOT DETECTED NOT DETECTED Final   Streptococcus pneumoniae NOT DETECTED NOT DETECTED Final   Streptococcus pyogenes NOT DETECTED NOT DETECTED Final   Acinetobacter baumannii NOT DETECTED NOT DETECTED Final   Enterobacteriaceae species DETECTED (A) NOT DETECTED Final    Comment: Enterobacteriaceae represent a large family of gram-negative bacteria, not a single organism. CRITICAL RESULT CALLED TO, READ BACK BY AND VERIFIED WITH: LHosie Poisson.D. 15:55 06/12/16 ([email or web address])    Enterobacter cloacae complex NOT DETECTED NOT DETECTED Final   Escherichia coli DETECTED (A) NOT DETECTED Final    Comment: CRITICAL RESULT CALLED TO, READ BACK BY AND VERIFIED WITH: LHosie Poisson.D. 15:55 06/12/16 ([email or web address])    Klebsiella oxytoca NOT DETECTED NOT DETECTED Final   Klebsiella pneumoniae NOT DETECTED NOT DETECTED Final   Proteus species NOT DETECTED NOT DETECTED Final   Serratia marcescens NOT DETECTED NOT DETECTED Final   Carbapenem resistance NOT DETECTED NOT DETECTED Final   Haemophilus influenzae NOT DETECTED NOT DETECTED Final   Neisseria meningitidis NOT DETECTED NOT DETECTED Final   Pseudomonas aeruginosa NOT DETECTED NOT  DETECTED Final   Candida albicans NOT DETECTED NOT DETECTED Final   Candida glabrata NOT DETECTED NOT DETECTED Final   Candida krusei NOT DETECTED NOT DETECTED Final   Candida parapsilosis NOT DETECTED NOT DETECTED Final   Candida tropicalis NOT DETECTED NOT DETECTED Final  Blood Culture (routine x 2)     Status: None (Preliminary result)   Collection Time: 06/11/16  8:31 PM  Result Value Ref Range Status   Specimen  Description BLOOD LEFT HAND  Final   Special Requests IN PEDIATRIC BOTTLE 4CC  Final   Culture NO GROWTH 4 DAYS  Final   Report Status PENDING  Incomplete  Urine culture     Status: Abnormal   Collection Time: 06/11/16  9:44 PM  Result Value Ref Range Status   Specimen Description URINE, RANDOM  Final   Special Requests NONE  Final   Culture >=100,000 COLONIES/mL ESCHERICHIA COLI (A)  Final   Report Status 06/14/2016 FINAL  Final   Organism ID, Bacteria ESCHERICHIA COLI (A)  Final      Susceptibility   Escherichia coli - MIC*    AMPICILLIN 4 SENSITIVE Sensitive     CEFAZOLIN <=4 SENSITIVE Sensitive     CEFTRIAXONE <=1 SENSITIVE Sensitive     CIPROFLOXACIN <=0.25 SENSITIVE Sensitive     GENTAMICIN <=1 SENSITIVE Sensitive     IMIPENEM <=0.25 SENSITIVE Sensitive     NITROFURANTOIN <=16 SENSITIVE Sensitive     TRIMETH/SULFA <=20 SENSITIVE Sensitive     AMPICILLIN/SULBACTAM <=2 SENSITIVE Sensitive     PIP/TAZO <=4 SENSITIVE Sensitive     Extended ESBL NEGATIVE Sensitive     * >=100,000 COLONIES/mL ESCHERICHIA COLI  MRSA PCR Screening     Status: None   Collection Time: 06/12/16  1:41 AM  Result Value Ref Range Status   MRSA by PCR NEGATIVE NEGATIVE Final    Comment:        The GeneXpert MRSA Assay (FDA approved for NASAL specimens only), is one component of a comprehensive MRSA colonization surveillance program. It is not intended to diagnose MRSA infection nor to guide or monitor treatment for MRSA infections.   Culture, blood (routine x 2)     Status: None (Preliminary result)   Collection Time: 06/14/16 12:37 PM  Result Value Ref Range Status   Specimen Description BLOOD RIGHT ANTECUBITAL  Final   Special Requests IN PEDIATRIC BOTTLE 3 CC  Final   Culture NO GROWTH < 24 HOURS  Final   Report Status PENDING  Incomplete  Culture, blood (routine x 2)     Status: None (Preliminary result)   Collection Time: 06/14/16 12:38 PM  Result Value Ref Range Status   Specimen  Description BLOOD RIGHT HAND  Final   Special Requests IN PEDIATRIC BOTTLE 2CC  Final   Culture NO GROWTH < 24 HOURS  Final   Report Status PENDING  Incomplete     Time coordinating discharge: 35 minutes  SIGNED:  Noralee Stain, DO [facility] Pager [phone]  If 7PM-7AM, please contact night-coverage [email or web address] Password [facility] 06/16/2016, 10:27 AM

## 2016-06-15 NOTE — Evaluation (Signed)
Physical Therapy Evaluation Patient Details Name: Rebecca [name] MRN: [id number] DOB: 1925-11-24 Today's Date: 06/15/2016   History of Present Illness  83 y.o. female admitted after an unwitnessed fall with suspected syncope and collapse. PMH: HTN, dementia, AS s/p TAVR, syncope, A.fib, panic attacks, macular degerneration Rt eye.   Clinical Impression  At this time the pt presents with poor safety awareness and high fall risk with mobility. The pt was able to ambulate 150 ft with rw but needing assistance for stability due to loss of balance on multiple occasions. Additionally, pt requiring repeated cues for safety throughout with poor carryover. Based upon the patient's current mobility level and high fall risk, recommending supervision for all mobility.     Follow Up Recommendations Other (comment);Supervision for mobility/OOB (ALF)    Equipment Recommendations  None recommended by PT    Recommendations for Other Services       Precautions / Restrictions Precautions Precautions: Fall Restrictions Weight Bearing Restrictions: No      Mobility  Bed Mobility Overal bed mobility: Needs Assistance Bed Mobility: Supine to Sit;Sit to Supine     Supine to sit: Min assist (assist provided at trunk) Sit to supine: Min assist (min assist provided with LEs)      Transfers Overall transfer level: Needs assistance Equipment used: Rolling walker (2 wheeled) Transfers: Sit to/from Stand Sit to Stand: Min guard         General transfer comment: Min guard needed for safety and repeated cues. Transfers performed from bed and toilet level.   Ambulation/Gait Ambulation/Gait assistance: Min guard;Min assist Ambulation Distance (Feet): 150 Feet Assistive device: Rolling walker (2 wheeled) Gait Pattern/deviations: Step-through pattern;Decreased stride length;Trunk flexed Gait velocity: decreased   General Gait Details: Pt requiring min guard during ambulation with occasional min  assist due to loss of balance. Pt has poor safety awareness throughout ambulation and difficulty maneuvering in narrow spaces.   Stairs            Wheelchair Mobility    Modified Rankin (Stroke Patients Only)       Balance Overall balance assessment: Needs assistance Sitting-balance support: No upper extremity supported Sitting balance-Leahy Scale: Good     Standing balance support: During functional activity Standing balance-Leahy Scale: Poor Standing balance comment: using rw for support                             Pertinent Vitals/Pain Pain Assessment: No/denies pain    Home Living Family/patient expects to be discharged to:: Private residence                 Additional Comments: Pt reports that she lives with her daughter but medical record indicates that she lives at ALF [facility]     Prior Function Level of Independence:  (uncertain, pt reports being ambulatory)               Hand Dominance        Extremity/Trunk Assessment   Upper Extremity Assessment Upper Extremity Assessment: Generalized weakness    Lower Extremity Assessment Lower Extremity Assessment: Generalized weakness    Cervical / Trunk Assessment Cervical / Trunk Assessment: Kyphotic  Communication   Communication: No difficulties  Cognition Arousal/Alertness: Awake/alert Behavior During Therapy: Impulsive Overall Cognitive Status: History of cognitive impairments - at baseline                 General Comments: pt with poor safety awareness but able to  follow single step commands consistently. Medical record indicates a history of dementia, no family present to confirm prior cognitive level. Pt able to recall place and year but does not know situation or month.     General Comments      Exercises     Assessment/Plan    PT Assessment Patient needs continued PT services  PT Problem List Decreased strength;Decreased range of motion;Decreased  activity tolerance;Decreased balance;Decreased mobility;Decreased knowledge of use of DME;Decreased safety awareness          PT Treatment Interventions DME instruction;Gait training;Functional mobility training;Therapeutic activities;Balance training;Therapeutic exercise;Patient/family education    PT Goals (Current goals can be found in the Care Plan section)  Acute Rehab PT Goals Patient Stated Goal: not expressed PT Goal Formulation: With patient Time For Goal Achievement: 06/29/16 Potential to Achieve Goals: Fair    Frequency Min 3X/week   Barriers to discharge        Co-evaluation               End of Session Equipment Utilized During Treatment: Gait belt Activity Tolerance: Patient tolerated treatment well Patient left: in bed;with call bell/phone within reach;with bed alarm set Nurse Communication: Mobility status         Time: 1610-9604 PT Time Calculation (min) (ACUTE ONLY): 23 min   Charges:   PT Evaluation $PT Eval Moderate Complexity: 1 Procedure PT Treatments $Gait Training: 8-22 mins   PT G Codes:        Christiane Ha, PT, CSCS Pager [phone] Office [phone]  06/15/2016, 3:52 PM

## 2016-06-15 NOTE — Progress Notes (Signed)
Occupational Therapy Evaluation Patient Details Name: Rebecca [name] MRN: [id number] DOB: 1925/11/12 Today's Date: 06/15/2016    History of Present Illness 83 y.o. female admitted after an unwitnessed fall with suspected syncope and collapse. PMH: HTN, dementia, AS s/p TAVR, syncope, A.fib, panic attacks, macular degerneration Rt eye.    Clinical Impression   Pt states she lives in her daughters basement and has a "very nice set up". Pt apparently lives at [facility]. Unsure of PLOF. Pt was able to ambulate to bathroom while furniture walking with S and complete ADL with overall S. Pt oriented to place and was aware that she was here due to a fall.  Feel pt can DC to ALF and will perform better in her familiar environment but will need S for mobility a nd ADL as pt is a fall risk. Recommend HHOT/PT work with pt at ALF if possible.     Follow Up Recommendations  Supervision/Assistance - 24 hour (return to ALF with S for all moblity and ADL) HHOT   Equipment Recommendations  showerseat   Recommendations for Other Services       Precautions / Restrictions Precautions Precautions: Fall Restrictions Weight Bearing Restrictions: No      Mobility Bed Mobility Overal bed mobility: Modified Independent Bed Mobility: Supine to Sit;Sit to Supine     Supine to sit:  (assist provided at trunk) Sit to supine:  (min assist provided with LEs)   General bed mobility comments: pt attempting to get OOB on entry to room  Transfers Overall transfer level: Needs assistance Equipment used: 1 person hand held assist Transfers: Sit to/from Stand Sit to Stand: Min guard         General transfer comment: Pt appears to furniture walk    Balance Overall balance assessment: Needs assistance;History of Falls Sitting-balance support: No upper extremity supported Sitting balance-Leahy Scale: Good     Standing balance support: During functional activity Standing balance-Leahy Scale:  Fair Standing balance comment: using rw for support                            ADL Overall ADL's : Needs assistance/impaired     Grooming: Set up;Standing   Upper Body Bathing: Set up;Sitting   Lower Body Bathing: Set up;Sit to/from stand   Upper Body Dressing : Set up;Sitting   Lower Body Dressing: Set up;Supervision/safety;Sit to/from stand   Toilet Transfer: Min guard;Ambulation Toilet Transfer Details (indicate cue type and reason): "furniture walking" at times Toileting- Clothing Manipulation and Hygiene: Modified independent       Functional mobility during ADLs: Min guard General ADL Comments: Pt copleted ADL with min guard for balance to S.      Vision     Perception     Praxis      Pertinent Vitals/Pain Pain Assessment: No/denies pain     Hand Dominance Right   Extremity/Trunk Assessment Upper Extremity Assessment Upper Extremity Assessment: Generalized weakness   Lower Extremity Assessment Lower Extremity Assessment: Defer to PT evaluation   Cervical / Trunk Assessment Cervical / Trunk Assessment: Kyphotic   Communication Communication Communication: No difficulties   Cognition Arousal/Alertness: Awake/alert Behavior During Therapy: Impulsive Overall Cognitive Status: History of cognitive impairments - at baseline (most likely baseline)                 General Comments: pt oriented to place adn situation (knew she fell but could not recount details)   General  Comments       Exercises       Shoulder Instructions      Home Living Family/patient expects to be discharged to:: Private residence                                 Additional Comments: Pt reports that she lives with her daughter but medical record indicates that she lives at ALF [facility]       Prior Functioning/Environment Level of Independence: Independent (uncertain, pt reports being ambulatory)                 OT Problem  List: Decreased strength;Decreased activity tolerance;Impaired balance (sitting and/or standing);Decreased cognition;Decreased safety awareness   OT Treatment/Interventions: Self-care/ADL training;Therapeutic exercise;DME and/or AE instruction;Therapeutic activities;Cognitive remediation/compensation;Patient/family education;Balance training    OT Goals(Current goals can be found in the care plan section) Acute Rehab OT Goals Patient Stated Goal: not expressed OT Goal Formulation: With patient Time For Goal Achievement: 06/29/16 Potential to Achieve Goals: Good ADL Goals Pt Will Perform Lower Body Dressing: sit to/from stand;with modified independence Pt Will Transfer to Toilet: with modified independence;ambulating  OT Frequency: Min 2X/week   Barriers to D/C:            Co-evaluation              End of Session Nurse Communication: Mobility status  Activity Tolerance: Patient tolerated treatment well Patient left: in bed;with call bell/phone within reach;with bed alarm set   Time: [id number] OT Time Calculation (min): 16 min Charges:  OT General Charges $OT Visit: 1 Procedure OT Evaluation $OT Eval Moderate Complexity: 1 Procedure G-Codes:    Jadakiss Barish,HILLARY 06/15/2016, 4:26 PM   Luisa DagoHilary Layden Caterino, OT/L  [phone] 06/15/2016

## 2016-06-15 NOTE — Consult Note (Signed)
   [facility]   06/15/2016  Rebecca [name] 01/20/1926 [id number]   [facility] in Medicare ACO Registry for her 2nd hospitalization in 2 months. Patient states she is returning to her place.  She denies any needs as she states, "My daughter handles everything."  Patient given a brochure with contact information.  Patient [facility]. No [facility] Care Management needs identified.  For questions contact:  Charlesetta ShanksVictoria Rahsaan Weakland, RN BSN CCM [facility] Liaison  [phone] business mobile phone Toll free office [phone]

## 2016-06-15 NOTE — Progress Notes (Signed)
PROGRESS NOTE    Rebecca [name]  WUJ:[id number] DOB: 10-22-1925 DOA: 06/11/2016 PCP: [name], MD     Brief Narrative:  Rebecca [name] is an 83 y.o. female with medical history significant of HTN, HLD, dementia, AS s/p TAVR, syncope, A.fib on Eliquis; who was admitted after an unwitnessed fall with suspected syncope and collapse. On admission she was febrile, tachypneic, and tachycardiac with a UA positive for infection. She was empirically started on Zosyn and Vancomycin in ED as sepsis protocol was initiated. Currently on Rocephin for UTI. Blood culture came back for E coli as well.    Assessment & Plan:   Principal Problem:   E coli bacteremia Active Problems:   Syncope   PAF (paroxysmal atrial fibrillation) (HCC)   Mild dementia   S/P TAVR (transcatheter aortic valve replacement)   Sepsis secondary to UTI (HCC)  Sepsis secondary to E Coli bacteremia and UTI, POA  - Urine culture with E Coli, pansensitive  - Blood culture 1 of 2 with E Coli, pansensitive  - Switch antibiotics to Ceftin today and will continue this at discharge  - Repeat blood culture to ensure clearance of bacteremia - pending   Syncope - Potentially in the setting of sepsis - Patient has history of syncopal episode on the past and was thought to have seizures. She was started on Depakote during a prior admission (12/17), but this was discontinued by her PCP due to adverse effects - PT OT eval   PAF - Continue anticoagulation with Eliquis. - Currently in sinus  Essential Hypertension - Continue amlodipine  Dementia - Continue Namenda, remeron   DVT prophylaxis: Eliquis Code Status: DNR Family Communication: spoke with daughter over the phone this morning   Disposition Plan: pending further stabilization, back to ALF    Consultants:   None  Procedures:   None  Antimicrobials:   Rocephin 2/12 >>> 2/16  Ceftin 2/16 >>>   Subjective: Patient without any complaints this morning. No  acute events    Objective: Vitals:   06/14/16 2202 06/15/16 0523 06/15/16 0841 06/15/16 0944  BP: 137/64 (!) 141/65 134/68 114/80  Pulse: 88 81 82   Resp: 18 18 17    Temp: 98.2 F (36.8 C) 97.7 F (36.5 C) 98.2 F (36.8 C)   TempSrc: Oral Oral Oral   SpO2: 95% 98% 97%     Intake/Output Summary (Last 24 hours) at 06/15/16 1132 Last data filed at 06/15/16 0949  Gross per 24 hour  Intake              290 ml  Output              300 ml  Net              -10 ml   There were no vitals filed for this visit.  Examination:  General exam: Appears calm and comfortable  Respiratory system: Clear to auscultation. Respiratory effort normal. Cardiovascular system: S1 & S2 heard, RRR. No JVD, murmurs, rubs, gallops or clicks. No pedal edema. Gastrointestinal system: Abdomen is nondistended, soft and nontender. No organomegaly or masses felt. Normal bowel sounds heard. Central nervous system: Alert and oriented. No focal neurological deficits. Extremities: Symmetric 5 x 5 power. Skin: No rashes, lesions or ulcers. +brusing over right chin/cheek due to fall   Data Reviewed: I have personally reviewed following labs and imaging studies  CBC:  Recent Labs Lab 06/11/16 2016 06/11/16 2053 06/12/16 0149 06/13/16 9562 06/14/16 0508 06/15/16 0412  WBC 13.0*  --  11.9* 8.5 6.1 6.5  NEUTROABS 10.6*  --   --   --  3.5 3.8  HGB 12.7 13.3 11.7* 11.4* 11.5* 11.5*  HCT 38.1 39.0 36.7 36.0 36.0 35.9*  MCV 84.9  --  85.0 86.1 85.3 84.7  PLT 123*  --  130* 130* 146* 172   Basic Metabolic Panel:  Recent Labs Lab 06/11/16 2016 06/11/16 2053 06/12/16 0149 06/13/16 0638 06/14/16 0508 06/15/16 0412  NA 136 137 139 139 139 140  K 3.1* 3.0* 3.6 3.0* 3.6 3.5  CL 98* 96* 102 102 103 103  CO2 26  --  24 26 27 27   GLUCOSE 144* 142* 140* 94 101* 99  BUN 16 17 11 8 6 7   CREATININE 0.94 0.90 0.82 0.72 0.66 0.72  CALCIUM 8.7*  --  8.2* 8.2* 8.5* 8.7*   GFR: CrCl cannot be calculated (Unknown  ideal weight.). Liver Function Tests:  Recent Labs Lab 06/11/16 2016  AST 22  ALT 14  ALKPHOS 118  BILITOT 0.6  PROT 6.5  ALBUMIN 3.1*   No results for input(s): LIPASE, AMYLASE in the last 168 hours. No results for input(s): AMMONIA in the last 168 hours. Coagulation Profile: No results for input(s): INR, PROTIME in the last 168 hours. Cardiac Enzymes: No results for input(s): CKTOTAL, CKMB, CKMBINDEX, TROPONINI in the last 168 hours. BNP (last 3 results) No results for input(s): PROBNP in the last 8760 hours. HbA1C: No results for input(s): HGBA1C in the last 72 hours. CBG: No results for input(s): GLUCAP in the last 168 hours. Lipid Profile: No results for input(s): CHOL, HDL, LDLCALC, TRIG, CHOLHDL, LDLDIRECT in the last 72 hours. Thyroid Function Tests: No results for input(s): TSH, T4TOTAL, FREET4, T3FREE, THYROIDAB in the last 72 hours. Anemia Panel: No results for input(s): VITAMINB12, FOLATE, FERRITIN, TIBC, IRON, RETICCTPCT in the last 72 hours. Sepsis Labs:  Recent Labs Lab 06/11/16 2054 06/11/16 2317 06/11/16 2344 06/12/16 0149  LATICACIDVEN 1.49 1.57 2.3* 1.1    Recent Results (from the past 240 hour(s))  Blood Culture (routine x 2)     Status: Abnormal   Collection Time: 06/11/16  8:20 PM  Result Value Ref Range Status   Specimen Description BLOOD RIGHT HAND  Final   Special Requests AEROBIC BOTTLE ONLY 5CC  Final   Culture  Setup Time   Final    GRAM NEGATIVE RODS AEROBIC BOTTLE ONLY Organism ID to follow CRITICAL RESULT CALLED TO, READ BACK BY AND VERIFIED WITH: LHosie Poisson.D. 15:55 06/12/16 ([email or web address])    Culture ESCHERICHIA COLI (A)  Final   Report Status 06/15/2016 FINAL  Final   Organism ID, Bacteria ESCHERICHIA COLI  Final      Susceptibility   Escherichia coli - MIC*    AMPICILLIN 4 SENSITIVE Sensitive     CEFAZOLIN <=4 SENSITIVE Sensitive     CEFEPIME <=1 SENSITIVE Sensitive     CEFTAZIDIME <=1 SENSITIVE Sensitive     CEFTRIAXONE  <=1 SENSITIVE Sensitive     CIPROFLOXACIN <=0.25 SENSITIVE Sensitive     GENTAMICIN <=1 SENSITIVE Sensitive     IMIPENEM <=0.25 SENSITIVE Sensitive     TRIMETH/SULFA <=20 SENSITIVE Sensitive     AMPICILLIN/SULBACTAM <=2 SENSITIVE Sensitive     PIP/TAZO <=4 SENSITIVE Sensitive     Extended ESBL NEGATIVE Sensitive     * ESCHERICHIA COLI  Blood Culture ID Panel (Reflexed)     Status: Abnormal   Collection Time: 06/11/16  8:20 PM  Result Value Ref Range Status  Enterococcus species NOT DETECTED NOT DETECTED Final   Listeria monocytogenes NOT DETECTED NOT DETECTED Final   Staphylococcus species NOT DETECTED NOT DETECTED Final   Staphylococcus aureus NOT DETECTED NOT DETECTED Final   Streptococcus species NOT DETECTED NOT DETECTED Final   Streptococcus agalactiae NOT DETECTED NOT DETECTED Final   Streptococcus pneumoniae NOT DETECTED NOT DETECTED Final   Streptococcus pyogenes NOT DETECTED NOT DETECTED Final   Acinetobacter baumannii NOT DETECTED NOT DETECTED Final   Enterobacteriaceae species DETECTED (A) NOT DETECTED Final    Comment: Enterobacteriaceae represent a large family of gram-negative bacteria, not a single organism. CRITICAL RESULT CALLED TO, READ BACK BY AND VERIFIED WITH: LHosie Poisson.D. 15:55 06/12/16 (wilsonm)    Enterobacter cloacae complex NOT DETECTED NOT DETECTED Final   Escherichia coli DETECTED (A) NOT DETECTED Final    Comment: CRITICAL RESULT CALLED TO, READ BACK BY AND VERIFIED WITH: LHosie Poisson.D. 15:55 06/12/16 (wilsonm)    Klebsiella oxytoca NOT DETECTED NOT DETECTED Final   Klebsiella pneumoniae NOT DETECTED NOT DETECTED Final   Proteus species NOT DETECTED NOT DETECTED Final   Serratia marcescens NOT DETECTED NOT DETECTED Final   Carbapenem resistance NOT DETECTED NOT DETECTED Final   Haemophilus influenzae NOT DETECTED NOT DETECTED Final   Neisseria meningitidis NOT DETECTED NOT DETECTED Final   Pseudomonas aeruginosa NOT DETECTED NOT DETECTED  Final   Candida albicans NOT DETECTED NOT DETECTED Final   Candida glabrata NOT DETECTED NOT DETECTED Final   Candida krusei NOT DETECTED NOT DETECTED Final   Candida parapsilosis NOT DETECTED NOT DETECTED Final   Candida tropicalis NOT DETECTED NOT DETECTED Final  Blood Culture (routine x 2)     Status: None (Preliminary result)   Collection Time: 06/11/16  8:31 PM  Result Value Ref Range Status   Specimen Description BLOOD LEFT HAND  Final   Special Requests IN PEDIATRIC BOTTLE 4CC  Final   Culture NO GROWTH 3 DAYS  Final   Report Status PENDING  Incomplete  Urine culture     Status: Abnormal   Collection Time: 06/11/16  9:44 PM  Result Value Ref Range Status   Specimen Description URINE, RANDOM  Final   Special Requests NONE  Final   Culture >=100,000 COLONIES/mL ESCHERICHIA COLI (A)  Final   Report Status 06/14/2016 FINAL  Final   Organism ID, Bacteria ESCHERICHIA COLI (A)  Final      Susceptibility   Escherichia coli - MIC*    AMPICILLIN 4 SENSITIVE Sensitive     CEFAZOLIN <=4 SENSITIVE Sensitive     CEFTRIAXONE <=1 SENSITIVE Sensitive     CIPROFLOXACIN <=0.25 SENSITIVE Sensitive     GENTAMICIN <=1 SENSITIVE Sensitive     IMIPENEM <=0.25 SENSITIVE Sensitive     NITROFURANTOIN <=16 SENSITIVE Sensitive     TRIMETH/SULFA <=20 SENSITIVE Sensitive     AMPICILLIN/SULBACTAM <=2 SENSITIVE Sensitive     PIP/TAZO <=4 SENSITIVE Sensitive     Extended ESBL NEGATIVE Sensitive     * >=100,000 COLONIES/mL ESCHERICHIA COLI  MRSA PCR Screening     Status: None   Collection Time: 06/12/16  1:41 AM  Result Value Ref Range Status   MRSA by PCR NEGATIVE NEGATIVE Final    Comment:        The GeneXpert MRSA Assay (FDA approved for NASAL specimens only), is one component of a comprehensive MRSA colonization surveillance program. It is not intended to diagnose MRSA infection nor to guide or monitor treatment for MRSA infections.  Radiology Studies: No results  found.    Scheduled Meds: . amLODipine  5 mg Oral Daily  . apixaban  2.5 mg Oral BID  . [START ON 06/16/2016] cefUROXime  500 mg Oral BID WC  . memantine  10 mg Oral BID  . mirtazapine  30 mg Oral QHS  . sertraline  100 mg Oral Daily  . sodium chloride flush  3 mL Intravenous Q12H   Continuous Infusions:   LOS: 4 days    Time spent: 30 minutes   Noralee StainJennifer Henriette Hesser, DO [facility] [email or web address] Password [email or web address] 06/15/2016, 11:32 AM

## 2016-06-16 LAB — BASIC METABOLIC PANEL
ANION GAP: 11 (ref 5–15)
BUN: 8 mg/dL (ref 6–20)
CALCIUM: 8.9 mg/dL (ref 8.9–10.3)
CO2: 28 mmol/L (ref 22–32)
Chloride: 104 mmol/L (ref 101–111)
Creatinine, Ser: 0.75 mg/dL (ref 0.44–1.00)
GLUCOSE: 98 mg/dL (ref 65–99)
Potassium: 3.4 mmol/L — ABNORMAL LOW (ref 3.5–5.1)
Sodium: 143 mmol/L (ref 135–145)

## 2016-06-16 LAB — CBC WITH DIFFERENTIAL/PLATELET
BASOS ABS: 0 10*3/uL (ref 0.0–0.1)
BASOS PCT: 0 %
EOS PCT: 2 %
Eosinophils Absolute: 0.2 10*3/uL (ref 0.0–0.7)
HCT: 37.7 % (ref 36.0–46.0)
Hemoglobin: 12 g/dL (ref 12.0–15.0)
Lymphocytes Relative: 32 %
Lymphs Abs: 2.3 10*3/uL (ref 0.7–4.0)
MCH: 27 pg (ref 26.0–34.0)
MCHC: 31.8 g/dL (ref 30.0–36.0)
MCV: 84.9 fL (ref 78.0–100.0)
MONO ABS: 0.6 10*3/uL (ref 0.1–1.0)
Monocytes Relative: 8 %
Neutro Abs: 4.2 10*3/uL (ref 1.7–7.7)
Neutrophils Relative %: 58 %
PLATELETS: 202 10*3/uL (ref 150–400)
RBC: 4.44 MIL/uL (ref 3.87–5.11)
RDW: 16.1 % — AB (ref 11.5–15.5)
WBC: 7.3 10*3/uL (ref 4.0–10.5)

## 2016-06-16 LAB — CULTURE, BLOOD (ROUTINE X 2): CULTURE: NO GROWTH

## 2016-06-16 MED ORDER — POTASSIUM CHLORIDE CRYS ER 20 MEQ PO TBCR
40.0000 meq | EXTENDED_RELEASE_TABLET | Freq: Once | ORAL | Status: AC
  Administered 2016-06-16: 40 meq via ORAL
  Filled 2016-06-16: qty 2

## 2016-06-16 NOTE — Progress Notes (Signed)
[facility]; discharged instructions review and sent to facility by social worker with care notes; IV DIC; facility was called and report was given to nurse who is going to receive the patient; patient will be transported to facility via [facility].

## 2016-06-16 NOTE — Progress Notes (Signed)
Clinical Social Worker facilitated patient discharge including contacting patient family and facility to confirm patient discharge plans.  Clinical information faxed to facility and family agreeable with plan.  CSW arranged [facility] .  RN Edgardo RoysGreta to call [phone] ([id number]) report prior to discharge.  Clinical Social Worker will sign off for now as social work intervention is no longer needed. Please consult us again if new need arises.  Marrianne MoodAshley Gautam Langhorst, MSW, [employer]CSWA [phone]

## 2016-06-18 DIAGNOSIS — F413 Other mixed anxiety disorders: Secondary | ICD-10-CM | POA: Diagnosis not present

## 2016-06-18 DIAGNOSIS — G301 Alzheimer's disease with late onset: Secondary | ICD-10-CM | POA: Diagnosis not present

## 2016-06-18 DIAGNOSIS — I10 Essential (primary) hypertension: Secondary | ICD-10-CM | POA: Diagnosis not present

## 2016-06-18 DIAGNOSIS — M6281 Muscle weakness (generalized): Secondary | ICD-10-CM | POA: Diagnosis not present

## 2016-06-18 DIAGNOSIS — R296 Repeated falls: Secondary | ICD-10-CM | POA: Diagnosis not present

## 2016-06-19 ENCOUNTER — Telehealth: Payer: Self-pay | Admitting: Diagnostic Neuroimaging

## 2016-06-19 LAB — CULTURE, BLOOD (ROUTINE X 2)
Culture: NO GROWTH
Culture: NO GROWTH

## 2016-06-19 NOTE — Telephone Encounter (Signed)
Pt's daughter called said [facility]. The provider at the facility is taking over her care. FYI Pt's daughter spoke highly of the care and concern Dr Marjory LiesPenumalli had for her mother.

## 2016-06-20 DIAGNOSIS — I10 Essential (primary) hypertension: Secondary | ICD-10-CM | POA: Diagnosis not present

## 2016-06-20 DIAGNOSIS — R2681 Unsteadiness on feet: Secondary | ICD-10-CM | POA: Diagnosis not present

## 2016-06-20 DIAGNOSIS — M6281 Muscle weakness (generalized): Secondary | ICD-10-CM | POA: Diagnosis not present

## 2016-06-20 DIAGNOSIS — G301 Alzheimer's disease with late onset: Secondary | ICD-10-CM | POA: Diagnosis not present

## 2016-06-20 NOTE — Telephone Encounter (Signed)
Noted. Thank you. -VRP 

## 2016-06-21 DIAGNOSIS — R2681 Unsteadiness on feet: Secondary | ICD-10-CM | POA: Diagnosis not present

## 2016-06-21 DIAGNOSIS — M6281 Muscle weakness (generalized): Secondary | ICD-10-CM | POA: Diagnosis not present

## 2016-06-22 DIAGNOSIS — M6281 Muscle weakness (generalized): Secondary | ICD-10-CM | POA: Diagnosis not present

## 2016-06-22 DIAGNOSIS — R2681 Unsteadiness on feet: Secondary | ICD-10-CM | POA: Diagnosis not present

## 2016-06-27 DIAGNOSIS — R2681 Unsteadiness on feet: Secondary | ICD-10-CM | POA: Diagnosis not present

## 2016-06-27 DIAGNOSIS — M6281 Muscle weakness (generalized): Secondary | ICD-10-CM | POA: Diagnosis not present

## 2016-06-28 DIAGNOSIS — M6281 Muscle weakness (generalized): Secondary | ICD-10-CM | POA: Diagnosis not present

## 2016-06-28 DIAGNOSIS — R2681 Unsteadiness on feet: Secondary | ICD-10-CM | POA: Diagnosis not present

## 2016-06-29 DIAGNOSIS — M6281 Muscle weakness (generalized): Secondary | ICD-10-CM | POA: Diagnosis not present

## 2016-06-29 DIAGNOSIS — R2681 Unsteadiness on feet: Secondary | ICD-10-CM | POA: Diagnosis not present

## 2016-07-02 DIAGNOSIS — M6281 Muscle weakness (generalized): Secondary | ICD-10-CM | POA: Diagnosis not present

## 2016-07-02 DIAGNOSIS — R2681 Unsteadiness on feet: Secondary | ICD-10-CM | POA: Diagnosis not present

## 2016-07-04 DIAGNOSIS — M6281 Muscle weakness (generalized): Secondary | ICD-10-CM | POA: Diagnosis not present

## 2016-07-04 DIAGNOSIS — R2681 Unsteadiness on feet: Secondary | ICD-10-CM | POA: Diagnosis not present

## 2016-07-05 DIAGNOSIS — R2681 Unsteadiness on feet: Secondary | ICD-10-CM | POA: Diagnosis not present

## 2016-07-05 DIAGNOSIS — M6281 Muscle weakness (generalized): Secondary | ICD-10-CM | POA: Diagnosis not present

## 2016-07-07 DIAGNOSIS — R2681 Unsteadiness on feet: Secondary | ICD-10-CM | POA: Diagnosis not present

## 2016-07-07 DIAGNOSIS — M6281 Muscle weakness (generalized): Secondary | ICD-10-CM | POA: Diagnosis not present

## 2016-07-10 ENCOUNTER — Ambulatory Visit: Payer: Medicare Other | Admitting: Diagnostic Neuroimaging

## 2016-07-26 ENCOUNTER — Emergency Department (HOSPITAL_COMMUNITY): Payer: Medicare Other

## 2016-07-26 ENCOUNTER — Emergency Department (HOSPITAL_COMMUNITY)
Admission: EM | Admit: 2016-07-26 | Discharge: 2016-07-26 | Disposition: A | Discharge status: Home / Self Care | Payer: Medicare Other | Attending: Emergency Medicine | Admitting: Emergency Medicine

## 2016-07-26 ENCOUNTER — Encounter (HOSPITAL_COMMUNITY): Payer: Self-pay | Admitting: Radiology

## 2016-07-26 DIAGNOSIS — Z87891 Personal history of nicotine dependence: Secondary | ICD-10-CM | POA: Insufficient documentation

## 2016-07-26 DIAGNOSIS — N3000 Acute cystitis without hematuria: Secondary | ICD-10-CM | POA: Diagnosis not present

## 2016-07-26 DIAGNOSIS — Z79899 Other long term (current) drug therapy: Secondary | ICD-10-CM | POA: Diagnosis not present

## 2016-07-26 DIAGNOSIS — S0003XA Contusion of scalp, initial encounter: Secondary | ICD-10-CM | POA: Diagnosis not present

## 2016-07-26 DIAGNOSIS — Y999 Unspecified external cause status: Secondary | ICD-10-CM | POA: Insufficient documentation

## 2016-07-26 DIAGNOSIS — W06XXXA Fall from bed, initial encounter: Secondary | ICD-10-CM | POA: Insufficient documentation

## 2016-07-26 DIAGNOSIS — Y929 Unspecified place or not applicable: Secondary | ICD-10-CM | POA: Insufficient documentation

## 2016-07-26 DIAGNOSIS — I10 Essential (primary) hypertension: Secondary | ICD-10-CM | POA: Insufficient documentation

## 2016-07-26 DIAGNOSIS — S0990XA Unspecified injury of head, initial encounter: Secondary | ICD-10-CM | POA: Diagnosis not present

## 2016-07-26 DIAGNOSIS — F0390 Unspecified dementia without behavioral disturbance: Secondary | ICD-10-CM | POA: Diagnosis not present

## 2016-07-26 DIAGNOSIS — Y939 Activity, unspecified: Secondary | ICD-10-CM | POA: Diagnosis not present

## 2016-07-26 DIAGNOSIS — S0083XA Contusion of other part of head, initial encounter: Secondary | ICD-10-CM | POA: Diagnosis not present

## 2016-07-26 DIAGNOSIS — S098XXA Other specified injuries of head, initial encounter: Secondary | ICD-10-CM | POA: Diagnosis not present

## 2016-07-26 DIAGNOSIS — S199XXA Unspecified injury of neck, initial encounter: Secondary | ICD-10-CM | POA: Diagnosis not present

## 2016-07-26 DIAGNOSIS — S0093XA Contusion of unspecified part of head, initial encounter: Secondary | ICD-10-CM | POA: Diagnosis not present

## 2016-07-26 LAB — URINALYSIS, ROUTINE W REFLEX MICROSCOPIC
Bilirubin Urine: NEGATIVE
GLUCOSE, UA: NEGATIVE mg/dL
Ketones, ur: NEGATIVE mg/dL
NITRITE: POSITIVE — AB
PROTEIN: 30 mg/dL — AB
Specific Gravity, Urine: 1.011 (ref 1.005–1.030)
pH: 8 (ref 5.0–8.0)

## 2016-07-26 MED ORDER — CEPHALEXIN 500 MG PO CAPS
500.0000 mg | ORAL_CAPSULE | Freq: Once | ORAL | Status: AC
  Administered 2016-07-26: 500 mg via ORAL
  Filled 2016-07-26: qty 1

## 2016-07-26 MED ORDER — ACETAMINOPHEN 325 MG PO TABS
650.0000 mg | ORAL_TABLET | Freq: Once | ORAL | Status: AC
  Administered 2016-07-26: 650 mg via ORAL
  Filled 2016-07-26: qty 2

## 2016-07-26 MED ORDER — CEPHALEXIN 500 MG PO CAPS: 500.0000 mg | ORAL_CAPSULE | Freq: Three times a day (TID) | ORAL | 0 refills | Status: DC

## 2016-07-26 NOTE — ED Provider Notes (Signed)
[facility]-EMERGENCY DEPT Provider Note   CSN: [id number] Arrival date & time: 07/26/16  0745     History   Chief Complaint Chief Complaint  Patient presents with  . Fall     Level V caveat: dementia   HPI Rebecca [name] is a 83 y.o. female.  HPI 83 yo female with dementia who presents to the ER with left frontal head injury and hematoma on eliquis. Pt reports she tripped. Per report pt has a hx of dementia, slight. Pt reports this happened the day before. Staff reports this is new since yesterday. Pt denies neck pain, arm pain and leg/hip pain at this time. No cp or abdominal pain reported   Past Medical History:  Diagnosis Date  . Age-related macular degeneration, wet, right eye (HCC)   . Aortic stenosis   . Aortic stenosis   . Dementia    "slight"/daughter 12/02/2013  . Dementia with Lewy bodies   . Depression   . Heart block   . Heart murmur    "slight"  . High cholesterol   . Hypertension   . Panic attacks   . Paroxysmal atrial fibrillation (HCC)   . S/P TAVR (transcatheter aortic valve replacement) 02/02/2014   23 mm Edwards Sapien XT transcatheter heart valve placed via open right transfemoral approach  . Syncope 08/21/2012  . UTI (lower urinary tract infection)     Patient Active Problem List   Diagnosis Date Noted  . E coli bacteremia 06/13/2016  . Sepsis secondary to UTI (HCC) 06/11/2016  . Moderate episode of recurrent major depressive disorder (HCC)   . Sleep-wake disorder   . Somnolence   . Decreased appetite   . Goals of care, counseling/discussion   . Palliative care by specialist   . Advance care planning   . Pre-syncope 04/16/2016  . Confusion 04/15/2016  . MDD (major depressive disorder), single episode, moderate (HCC) 07/04/2015  . S/P TAVR (transcatheter aortic valve replacement) 02/02/2014  . Severe aortic stenosis 02/02/2014  . Sinus tachycardia 12/04/2013  . Sinus bradycardia 12/04/2013  . Encounter for therapeutic drug monitoring  06/17/2013  . Mild dementia 11/06/2012  . Paroxysmal atrial fibrillation (HCC)   . PAF (paroxysmal atrial fibrillation) (HCC) 10/03/2012  . Long term (current) use of anticoagulants 10/03/2012  . Syncope 08/21/2012  . Aortic stenosis, severe 07/09/2012  . Near syncope 07/08/2012  . UTI (lower urinary tract infection) 07/08/2012  . BRONCHIECTASIS 12/09/2007  . SINUSITIS, CHRONIC 07/07/2007  . BRONCHITIS, CHRONIC 07/07/2007  . HYPERLIPIDEMIA 06/09/2007  . Essential hypertension 06/09/2007  . ALLERGIC RHINITIS 06/09/2007  . OSTEOPOROSIS 06/09/2007    Past Surgical History:  Procedure Laterality Date  . CARDIAC CATHETERIZATION  04/2013  . EYE SURGERY     cataracts  . INTRAOPERATIVE TRANSESOPHAGEAL ECHOCARDIOGRAM N/A 02/02/2014   Procedure: INTRAOPERATIVE TRANSESOPHAGEAL ECHOCARDIOGRAM;  Surgeon: Tonny Bollman, MD;  Location: [facility] OR;  Service: Open Heart Surgery;  Laterality: N/A;  . LEFT AND RIGHT HEART CATHETERIZATION WITH CORONARY ANGIOGRAM N/A 05/25/2013   Procedure: LEFT AND RIGHT HEART CATHETERIZATION WITH CORONARY ANGIOGRAM;  Surgeon: [name] [name], MD;  Location: [facility] CATH LAB;  Service: Cardiovascular;  Laterality: N/A;  . TONSILLECTOMY    . TRANSCATHETER AORTIC VALVE REPLACEMENT, TRANSFEMORAL  02/02/2014   Edwards Sapien XT THV (size 23 mm, model # 9300TFX, serial # [id number])  . TRANSCATHETER AORTIC VALVE REPLACEMENT, TRANSFEMORAL N/A 02/02/2014   Procedure: TRANSCATHETER AORTIC VALVE REPLACEMENT, TRANSFEMORAL;  Surgeon: Tonny Bollman, MD;  Location: [facility] OR;  Service: Open Heart Surgery;  Laterality:  N/A;    OB History    No data available       Home Medications    Prior to Admission medications   Medication Sig Start Date End Date Taking? Authorizing Provider  amLODipine (NORVASC) 5 MG tablet Take 5 mg by mouth daily with breakfast.    Yes Historical Provider, MD  ELIQUIS 2.5 MG TABS tablet TAKE 1 TABLET (2.5 MG TOTAL) BY MOUTH 2 (TWO) TIMES DAILY. 02/06/16  Yes [name]  [name], MD  LORazepam (ATIVAN) 0.5 MG tablet Take 0.5 mg by mouth 2 (two) times daily as needed for anxiety (agitation).  07/12/15  Yes Historical Provider, MD  memantine (NAMENDA) 10 MG tablet Take 1 tablet (10 mg total) by mouth 2 (two) times daily. 01/09/16  Yes Suanne Marker, MD  mirtazapine (REMERON) 30 MG tablet Take 30 mg by mouth at bedtime.   Yes Historical Provider, MD  NUTRITIONAL SUPPLEMENT LIQD Take 1 Bottle by mouth 3 (three) times daily with meals. *House Shake*   Yes Historical Provider, MD  sertraline (ZOLOFT) 100 MG tablet Take 100 mg by mouth daily with breakfast.  07/20/15  Yes Historical Provider, MD  feeding supplement, ENSURE ENLIVE, (ENSURE ENLIVE) LIQD Take 237 mLs by mouth 2 (two) times daily between meals. Patient not taking: Reported on 06/11/2016 04/18/16   Joseph Art, DO    Family History Family History  Problem Relation Age of Onset  . Heart attack Mother 42  . Heart Problems Father   . Heart attack Father 49  . Cerebral aneurysm Sister 71  . Sudden death Brother 59    Social History Social History  Substance Use Topics  . Smoking status: Former Smoker    Packs/day: 0.40    Years: 30.00    Types: Cigarettes    Quit date: 04/30/1960  . Smokeless tobacco: Never Used  . Alcohol use No     Allergies   Patient has no known allergies.   Review of Systems Review of Systems  Unable to perform ROS: Dementia     Physical Exam Updated Vital Signs BP (!) 190/84 (BP Location: Right Arm)   Pulse 85   Temp 98.2 F (36.8 C) (Oral)   Resp 17   SpO2 100%   Physical Exam  Constitutional: She appears well-developed and well-nourished.  HENT:  Head: Normocephalic.  Large left forehead hematoma without laceration  Eyes: EOM are normal. Pupils are equal, round, and reactive to light.  Neck: Neck supple.  Mild paracervical tenderness. No c spine point tenderness  Cardiovascular: Regular rhythm.   Pulmonary/Chest: Effort normal.  Abdominal: Soft.  She exhibits no distension.  Musculoskeletal:  Full ROM of bilateral hips, knees and ankles. Full ROM of bilateral shoulders, elbows and wrists.   Neurological: She is alert.  5/5 strength in major muscle groups of  bilateral upper and lower extremities. Speech normal. No facial asymetry.   Psychiatric: She has a normal mood and affect.  Nursing note and vitals reviewed.    ED Treatments / Results  Labs (all labs ordered are listed, but only abnormal results are displayed) Labs Reviewed  URINALYSIS, ROUTINE W REFLEX MICROSCOPIC - Abnormal; Notable for the following:       Result Value   APPearance CLOUDY (*)    Hgb urine dipstick SMALL (*)    Protein, ur 30 (*)    Nitrite POSITIVE (*)    Leukocytes, UA LARGE (*)    Bacteria, UA RARE (*)    Squamous Epithelial /  LPF 0-5 (*)    All other components within normal limits  URINE CULTURE    EKG  EKG Interpretation None       Radiology Ct Head Wo Contrast  Result Date: 07/26/2016 CLINICAL DATA:  Larey SeatFell out of bed last night, left forehead hematoma EXAM: CT HEAD WITHOUT CONTRAST CT CERVICAL SPINE WITHOUT CONTRAST TECHNIQUE: Multidetector CT imaging of the head and cervical spine was performed following the standard protocol without intravenous contrast. Multiplanar CT image reconstructions of the cervical spine were also generated. COMPARISON:  06/11/2016 FINDINGS: CT HEAD FINDINGS Brain: No intracranial hemorrhage, mass effect or midline shift. Stable atrophy and chronic white matter disease. No definite acute cortical infarction. Ventricular size is stable from prior exam. Vascular: Atherosclerotic calcifications of carotid siphon and vertebral arteries again noted. Skull: No skull fracture is noted.  No zygomatic fracture. Sinuses/Orbits: Mild mucosal thickening right ethmoid air cells. There is mucosal thickening with almost complete opacification right maxillary sinus. The mastoid air cells are unremarkable. Other: There is scalp  swelling and subcutaneous stranding in left frontal region. Small subcutaneous hematoma left frontal scalp measures 1.6 cm length by 5.5 mm thickness. CT CERVICAL SPINE FINDINGS Alignment: The alignment is preserved. Stable chronic mild anterior subluxation C3 on C4 vertebral body. Stable slight retrolisthesis C4 on C5 vertebral body. Skull base and vertebrae: No acute fracture or subluxation. Diffuse osteopenia. Again noted degenerative changes C1-C2 articulation. Stable chronic fusion of posterior elements C2-C3 level. Mild anterior and mild posterior spurring at C4-C5 and C5-C6 level. Soft tissues and spinal canal: Spinal canal is patent. No prevertebral soft tissue swelling. Cervical airway is patent. Disc levels: Mild disc space flattening at C3-C4, C4-C5 and C6-C7 level. Moderate disc space flattening with mild endplate sclerotic changes at C5-C6 level. Multilevel facet degenerative changes are again noted. Upper chest: There is no pneumothorax in visualized lung apices. Mild bilateral apical scarring. Other: None IMPRESSION: 1. No acute intracranial abnormality. Stable atrophy and chronic white matter disease. There is scalp swelling subcutaneous stranding and small scalp hematoma in left frontal region. 2. There is mucosal thickening with complete opacification right maxillary sinus. Mild mucosal thickening with partial opacification right ethmoid air cells. 3. No cervical spine acute fracture or subluxation. Stable degenerative changes as described above. Stable mild anterolisthesis C3 on C4 vertebral body and minimal retrolisthesis C4 on C5 vertebral body. No prevertebral soft tissue swelling. Spinal canal is patent. Electronically Signed   By: Natasha MeadLiviu  Pop M.D.   On: 07/26/2016 09:27   Ct Cervical Spine Wo Contrast  Result Date: 07/26/2016 CLINICAL DATA:  Larey SeatFell out of bed last night, left forehead hematoma EXAM: CT HEAD WITHOUT CONTRAST CT CERVICAL SPINE WITHOUT CONTRAST TECHNIQUE: Multidetector CT  imaging of the head and cervical spine was performed following the standard protocol without intravenous contrast. Multiplanar CT image reconstructions of the cervical spine were also generated. COMPARISON:  06/11/2016 FINDINGS: CT HEAD FINDINGS Brain: No intracranial hemorrhage, mass effect or midline shift. Stable atrophy and chronic white matter disease. No definite acute cortical infarction. Ventricular size is stable from prior exam. Vascular: Atherosclerotic calcifications of carotid siphon and vertebral arteries again noted. Skull: No skull fracture is noted.  No zygomatic fracture. Sinuses/Orbits: Mild mucosal thickening right ethmoid air cells. There is mucosal thickening with almost complete opacification right maxillary sinus. The mastoid air cells are unremarkable. Other: There is scalp swelling and subcutaneous stranding in left frontal region. Small subcutaneous hematoma left frontal scalp measures 1.6 cm length by 5.5 mm thickness. CT CERVICAL  SPINE FINDINGS Alignment: The alignment is preserved. Stable chronic mild anterior subluxation C3 on C4 vertebral body. Stable slight retrolisthesis C4 on C5 vertebral body. Skull base and vertebrae: No acute fracture or subluxation. Diffuse osteopenia. Again noted degenerative changes C1-C2 articulation. Stable chronic fusion of posterior elements C2-C3 level. Mild anterior and mild posterior spurring at C4-C5 and C5-C6 level. Soft tissues and spinal canal: Spinal canal is patent. No prevertebral soft tissue swelling. Cervical airway is patent. Disc levels: Mild disc space flattening at C3-C4, C4-C5 and C6-C7 level. Moderate disc space flattening with mild endplate sclerotic changes at C5-C6 level. Multilevel facet degenerative changes are again noted. Upper chest: There is no pneumothorax in visualized lung apices. Mild bilateral apical scarring. Other: None IMPRESSION: 1. No acute intracranial abnormality. Stable atrophy and chronic white matter disease.  There is scalp swelling subcutaneous stranding and small scalp hematoma in left frontal region. 2. There is mucosal thickening with complete opacification right maxillary sinus. Mild mucosal thickening with partial opacification right ethmoid air cells. 3. No cervical spine acute fracture or subluxation. Stable degenerative changes as described above. Stable mild anterolisthesis C3 on C4 vertebral body and minimal retrolisthesis C4 on C5 vertebral body. No prevertebral soft tissue swelling. Spinal canal is patent. Electronically Signed   By: Natasha Mead M.D.   On: 07/26/2016 09:27    Procedures Procedures (including critical care time)  Medications Ordered in ED Medications - No data to display   Initial Impression / Assessment and Plan / ED Course  I have reviewed the triage vital signs and the nursing notes.  Pertinent labs & imaging results that were available during my care of the patient were reviewed by me and considered in my medical decision making (see chart for details).     CT imaging of the head and neck normal.  Full range of motion bilateral major joints of both upper and lower extremities.  Nursing staff noted increasing urinary frequency while in the emergency department.  Urine sent.  Urine demonstrates urinary tract infection.  Home with Keflex.  Urine culture sent.  Patient understands to return to the ER for new or worsening symptoms  Final Clinical Impressions(s) / ED Diagnoses   Final diagnoses:  Injury of head, initial encounter  Traumatic hematoma of forehead, initial encounter  Acute cystitis without hematuria    New Prescriptions New Prescriptions   No medications on file     Azalia Bilis, MD 07/26/16 [phone]

## 2016-07-26 NOTE — ED Triage Notes (Signed)
Per EMS[facility]. Staff suspects that pt fell out of bed last night and got back in bed explaining the large hematoma on the left side of her forehead. Pt currently takes eliquis. Pt is only alert to self and ambulates with walker.

## 2016-07-26 NOTE — ED Notes (Signed)
Bed: [id number] Expected date:  Expected time:  Means of arrival:  Comments: EMS dementia, fall

## 2016-07-28 LAB — URINE CULTURE: Culture: >=100000 — AB

## 2016-07-29 ENCOUNTER — Telehealth: Payer: Self-pay

## 2016-07-29 NOTE — Telephone Encounter (Signed)
Post ED Visit - Positive Culture Follow-up  Culture report reviewed by antimicrobial stewardship pharmacist:   Enzo Bi, Pharm.D.  Celedonio Miyamoto, [address].D., BCPS AQ-ID  Garvin Fila, Pharm.D., BCPS  Georgina Pillion, Pharm.D., BCPS  [place], [address].D., BCPS, AAHIVP  Estella Husk, Pharm.D., BCPS, AAHIVP  Lysle Pearl, PharmD, BCPS  Casilda Carls, PharmD, BCPS  Pollyann Samples, PharmD, BCPS [place] [facility] Pharm D Positive urine culture Treated with Cephalexin, organism sensitive to the same and no further patient follow-up is required at this time.  Jerry Caras 07/29/2016, 11:35 AM

## 2016-07-31 DIAGNOSIS — F413 Other mixed anxiety disorders: Secondary | ICD-10-CM | POA: Diagnosis not present

## 2016-07-31 DIAGNOSIS — Z87440 Personal history of urinary (tract) infections: Secondary | ICD-10-CM | POA: Diagnosis not present

## 2016-07-31 DIAGNOSIS — R296 Repeated falls: Secondary | ICD-10-CM | POA: Diagnosis not present

## 2016-07-31 DIAGNOSIS — G301 Alzheimer's disease with late onset: Secondary | ICD-10-CM | POA: Diagnosis not present

## 2016-08-09 ENCOUNTER — Other Ambulatory Visit: Payer: Self-pay | Admitting: Cardiology

## 2016-08-10 ENCOUNTER — Other Ambulatory Visit: Payer: Self-pay | Admitting: *Deleted

## 2016-08-10 MED ORDER — AMLODIPINE BESYLATE 5 MG PO TABS: 5.0000 mg | ORAL_TABLET | Freq: Every day | ORAL | 1 refills | Status: DC

## 2016-08-14 ENCOUNTER — Other Ambulatory Visit: Payer: Self-pay

## 2016-08-14 MED ORDER — AMLODIPINE BESYLATE 5 MG PO TABS: 5.0000 mg | ORAL_TABLET | Freq: Every day | ORAL | 1 refills | Status: DC

## 2016-08-15 ENCOUNTER — Ambulatory Visit: Payer: Medicare Other | Admitting: Diagnostic Neuroimaging

## 2016-08-17 DIAGNOSIS — M79675 Pain in left toe(s): Secondary | ICD-10-CM | POA: Diagnosis not present

## 2016-08-17 DIAGNOSIS — B351 Tinea unguium: Secondary | ICD-10-CM | POA: Diagnosis not present

## 2016-08-17 DIAGNOSIS — M79674 Pain in right toe(s): Secondary | ICD-10-CM | POA: Diagnosis not present

## 2016-08-22 ENCOUNTER — Encounter (INDEPENDENT_AMBULATORY_CARE_PROVIDER_SITE_OTHER): Payer: Medicare Other | Admitting: Ophthalmology

## 2016-08-23 ENCOUNTER — Telehealth: Payer: Self-pay | Admitting: Cardiology

## 2016-08-23 NOTE — Telephone Encounter (Signed)
Pt received a prescription from [facility] for Amlodipine  and they never get a prescription from them, pt need a call back to discuss.

## 2016-08-23 NOTE — Telephone Encounter (Signed)
Spoke with Shea Evans she is calling to inquire if we sent rx to [facility] pharmacy they do not want pt's rx's to go there. For amlodipine. We did not send this rx per Epic

## 2016-08-25 ENCOUNTER — Emergency Department (HOSPITAL_COMMUNITY)
Admission: EM | Admit: 2016-08-25 | Discharge: 2016-08-26 | Disposition: A | Discharge status: Home / Self Care | Payer: Medicare Other | Attending: Emergency Medicine | Admitting: Emergency Medicine

## 2016-08-25 ENCOUNTER — Encounter (HOSPITAL_COMMUNITY): Payer: Self-pay | Admitting: Emergency Medicine

## 2016-08-25 ENCOUNTER — Emergency Department (HOSPITAL_COMMUNITY): Payer: Medicare Other

## 2016-08-25 DIAGNOSIS — I10 Essential (primary) hypertension: Secondary | ICD-10-CM | POA: Insufficient documentation

## 2016-08-25 DIAGNOSIS — R109 Unspecified abdominal pain: Secondary | ICD-10-CM | POA: Diagnosis not present

## 2016-08-25 DIAGNOSIS — R509 Fever, unspecified: Secondary | ICD-10-CM | POA: Diagnosis not present

## 2016-08-25 DIAGNOSIS — Z87891 Personal history of nicotine dependence: Secondary | ICD-10-CM | POA: Diagnosis not present

## 2016-08-25 DIAGNOSIS — Z79899 Other long term (current) drug therapy: Secondary | ICD-10-CM | POA: Diagnosis not present

## 2016-08-25 DIAGNOSIS — R1084 Generalized abdominal pain: Secondary | ICD-10-CM | POA: Diagnosis not present

## 2016-08-25 DIAGNOSIS — N3000 Acute cystitis without hematuria: Secondary | ICD-10-CM | POA: Insufficient documentation

## 2016-08-25 DIAGNOSIS — Z7901 Long term (current) use of anticoagulants: Secondary | ICD-10-CM | POA: Diagnosis not present

## 2016-08-25 LAB — LIPASE, BLOOD: Lipase: 20 U/L (ref 11–51)

## 2016-08-25 LAB — COMPREHENSIVE METABOLIC PANEL
ALK PHOS: 97 U/L (ref 38–126)
ALT: 17 U/L (ref 14–54)
AST: 18 U/L (ref 15–41)
Albumin: 3 g/dL — ABNORMAL LOW (ref 3.5–5.0)
Anion gap: 11 (ref 5–15)
BILIRUBIN TOTAL: 0.3 mg/dL (ref 0.3–1.2)
BUN: 12 mg/dL (ref 6–20)
CALCIUM: 8.5 mg/dL — AB (ref 8.9–10.3)
CO2: 26 mmol/L (ref 22–32)
Chloride: 104 mmol/L (ref 101–111)
Creatinine, Ser: 0.77 mg/dL (ref 0.44–1.00)
GFR calc Af Amer: >60 mL/min (ref >60)
Glucose, Bld: 130 mg/dL — ABNORMAL HIGH (ref 65–99)
POTASSIUM: 3.4 mmol/L — AB (ref 3.5–5.1)
Sodium: 141 mmol/L (ref 135–145)
TOTAL PROTEIN: 6.8 g/dL (ref 6.5–8.1)

## 2016-08-25 LAB — CBC
HEMATOCRIT: 38.5 % (ref 36.0–46.0)
Hemoglobin: 12.4 g/dL (ref 12.0–15.0)
MCH: 27.3 pg (ref 26.0–34.0)
MCHC: 32.2 g/dL (ref 30.0–36.0)
MCV: 84.6 fL (ref 78.0–100.0)
PLATELETS: 179 10*3/uL (ref 150–400)
RBC: 4.55 MIL/uL (ref 3.87–5.11)
RDW: 14.7 % (ref 11.5–15.5)
WBC: 11.5 10*3/uL — AB (ref 4.0–10.5)

## 2016-08-25 MED ORDER — IOPAMIDOL (ISOVUE-300) INJECTION 61%
INTRAVENOUS | Status: AC
  Filled 2016-08-25: qty 100

## 2016-08-25 NOTE — ED Notes (Signed)
Bed: WA02 Expected date:  Expected time:  Means of arrival:  Comments: 44f fever 100.2

## 2016-08-25 NOTE — ED Provider Notes (Signed)
[facility]-EMERGENCY DEPT Provider Note   CSN: [id number] Arrival date & time: 08/25/16  2236  By signing my name below, I, Phillips Climes, attest that this documentation has been prepared under the direction and in the presence of Renne Crigler, [place].  Electronically Signed: Phillips Climes, Scribe. 08/25/2016. 11:54 PM.  History   Chief Complaint Chief Complaint  Patient presents with  . Abdominal Pain   Rebecca [name] is a 83 y.o. female with a PMHx of dementia who presents to the Emergency Department via EMS from nursing home with complaints of abdominal pain and fever (max temp 100.2.) Per EMS, pt refused IV en route. On exam, pt states that her sx have resolved and states that she would like to go to sleep.   Pt denies experiencing any other acute sx, including sore throat or skin rashes.   LEVEL 5 CAVEAT DUE TO DEMENTIA.   The history is provided by the patient, the nursing home and the EMS personnel. No language interpreter was used.   Past Medical History:  Diagnosis Date  . Age-related macular degeneration, wet, right eye (HCC)   . Aortic stenosis   . Aortic stenosis   . Dementia    "slight"/daughter 12/02/2013  . Dementia with Lewy bodies   . Depression   . Heart block   . Heart murmur    "slight"  . High cholesterol   . Hypertension   . Panic attacks   . Paroxysmal atrial fibrillation (HCC)   . S/P TAVR (transcatheter aortic valve replacement) 02/02/2014   23 mm Edwards Sapien XT transcatheter heart valve placed via open right transfemoral approach  . Syncope 08/21/2012  . UTI (lower urinary tract infection)     Patient Active Problem List   Diagnosis Date Noted  . E coli bacteremia 06/13/2016  . Sepsis secondary to UTI (HCC) 06/11/2016  . Moderate episode of recurrent major depressive disorder (HCC)   . Sleep-wake disorder   . Somnolence   . Decreased appetite   . Goals of care, counseling/discussion   . Palliative care by specialist   . Advance care planning    . Pre-syncope 04/16/2016  . Confusion 04/15/2016  . MDD (major depressive disorder), single episode, moderate (HCC) 07/04/2015  . S/P TAVR (transcatheter aortic valve replacement) 02/02/2014  . Severe aortic stenosis 02/02/2014  . Sinus tachycardia 12/04/2013  . Sinus bradycardia 12/04/2013  . Encounter for therapeutic drug monitoring 06/17/2013  . Mild dementia 11/06/2012  . Paroxysmal atrial fibrillation (HCC)   . PAF (paroxysmal atrial fibrillation) (HCC) 10/03/2012  . Long term (current) use of anticoagulants 10/03/2012  . Syncope 08/21/2012  . Aortic stenosis, severe 07/09/2012  . Near syncope 07/08/2012  . UTI (lower urinary tract infection) 07/08/2012  . BRONCHIECTASIS 12/09/2007  . SINUSITIS, CHRONIC 07/07/2007  . BRONCHITIS, CHRONIC 07/07/2007  . HYPERLIPIDEMIA 06/09/2007  . Essential hypertension 06/09/2007  . ALLERGIC RHINITIS 06/09/2007  . OSTEOPOROSIS 06/09/2007    Past Surgical History:  Procedure Laterality Date  . CARDIAC CATHETERIZATION  04/2013  . EYE SURGERY     cataracts  . INTRAOPERATIVE TRANSESOPHAGEAL ECHOCARDIOGRAM N/A 02/02/2014   Procedure: INTRAOPERATIVE TRANSESOPHAGEAL ECHOCARDIOGRAM;  Surgeon: Tonny Bollman, MD;  Location: [facility] OR;  Service: Open Heart Surgery;  Laterality: N/A;  . LEFT AND RIGHT HEART CATHETERIZATION WITH CORONARY ANGIOGRAM N/A 05/25/2013   Procedure: LEFT AND RIGHT HEART CATHETERIZATION WITH CORONARY ANGIOGRAM;  Surgeon: [name] [name], MD;  Location: [facility] CATH LAB;  Service: Cardiovascular;  Laterality: N/A;  . TONSILLECTOMY    .  TRANSCATHETER AORTIC VALVE REPLACEMENT, TRANSFEMORAL  02/02/2014   Edwards Sapien XT THV (size 23 mm, model # 9300TFX, serial # [id number])  . TRANSCATHETER AORTIC VALVE REPLACEMENT, TRANSFEMORAL N/A 02/02/2014   Procedure: TRANSCATHETER AORTIC VALVE REPLACEMENT, TRANSFEMORAL;  Surgeon: Tonny Bollman, MD;  Location: [facility] OR;  Service: Open Heart Surgery;  Laterality: N/A;    OB History    No data available       Home Medications    Prior to Admission medications   Medication Sig Start Date End Date Taking? Authorizing Provider  amLODipine (NORVASC) 5 MG tablet Take 1 tablet (5 mg total) by mouth daily with breakfast. 08/14/16  Yes [name] [name], MD  Cranberry 450 MG TABS Take 450 mg by mouth 2 (two) times daily.   Yes Historical Provider, MD  ELIQUIS 2.5 MG TABS tablet TAKE 1 TABLET (2.5 MG TOTAL) BY MOUTH 2 (TWO) TIMES DAILY. 08/10/16  Yes [name] [name], MD  memantine (NAMENDA) 10 MG tablet Take 1 tablet (10 mg total) by mouth 2 (two) times daily. 01/09/16  Yes Suanne Marker, MD  mirtazapine (REMERON) 30 MG tablet Take 30 mg by mouth at bedtime.   Yes Historical Provider, MD  NUTRITIONAL SUPPLEMENT LIQD Take 1 Bottle by mouth 3 (three) times daily with meals. *House Shake*   Yes Historical Provider, MD  sertraline (ZOLOFT) 100 MG tablet Take 100 mg by mouth daily with breakfast.  07/20/15  Yes Historical Provider, MD  cephALEXin (KEFLEX) 500 MG capsule Take 1 capsule (500 mg total) by mouth 3 (three) times daily. Patient not taking: Reported on 08/26/2016 07/26/16   Azalia Bilis, MD  feeding supplement, ENSURE ENLIVE, (ENSURE ENLIVE) LIQD Take 237 mLs by mouth 2 (two) times daily between meals. Patient not taking: Reported on 06/11/2016 04/18/16   Joseph Art, DO    Family History Family History  Problem Relation Age of Onset  . Heart attack Mother 31  . Heart Problems Father   . Heart attack Father 31  . Cerebral aneurysm Sister 22  . Sudden death Brother 41    Social History Social History  Substance Use Topics  . Smoking status: Former Smoker    Packs/day: 0.40    Years: 30.00    Types: Cigarettes    Quit date: 04/30/1960  . Smokeless tobacco: Never Used  . Alcohol use No     Allergies   Patient has no known allergies.   Review of Systems Review of Systems  Unable to perform ROS: Dementia  Constitutional: Positive for fever.  Gastrointestinal: Positive for abdominal  pain.   LEVEL 5 CAVEAT DUE TO DEMENTIA  Physical Exam Updated Vital Signs BP (!) 130/59 (BP Location: Right Arm)   Pulse 83   Temp 100.1 F (37.8 C) (Oral)   Resp 18   SpO2 95%   Physical Exam  Constitutional: She appears well-developed and well-nourished.  HENT:  Head: Normocephalic and atraumatic.  Mouth/Throat: Oropharynx is clear and moist.  Eyes: Conjunctivae are normal. Right eye exhibits no discharge. Left eye exhibits no discharge.  Neck: Normal range of motion. Neck supple.  Cardiovascular: Normal rate, regular rhythm and normal heart sounds.   No murmur heard. Pulmonary/Chest: Effort normal and breath sounds normal. No respiratory distress. She has no wheezes. She has no rales.  Abdominal: Soft. There is no tenderness. There is no rebound and no guarding.  Musculoskeletal: Normal range of motion.  Neurological: She is alert. No cranial nerve deficit. She exhibits normal muscle tone.  Pt  is pleasantly confused. Answers yes/no questions.   Skin: Skin is warm and dry.  Psychiatric: She has a normal mood and affect.  Nursing note and vitals reviewed.   ED Treatments / Results  DIAGNOSTIC STUDIES: Oxygen Saturation is 95% on room air, adequate by my interpretation.    COORDINATION OF CARE: 11:29 PM Discussed treatment plan with pt at bedside. Pt is agreeable to plan  Labs (all labs ordered are listed, but only abnormal results are displayed) Labs Reviewed  COMPREHENSIVE METABOLIC PANEL - Abnormal; Notable for the following:       Result Value   Potassium 3.4 (*)    Glucose, Bld 130 (*)    Calcium 8.5 (*)    Albumin 3.0 (*)    All other components within normal limits  CBC - Abnormal; Notable for the following:    WBC 11.5 (*)    All other components within normal limits  URINALYSIS, ROUTINE W REFLEX MICROSCOPIC - Abnormal; Notable for the following:    APPearance HAZY (*)    Hgb urine dipstick SMALL (*)    Leukocytes, UA MODERATE (*)    Bacteria, UA RARE  (*)    Squamous Epithelial / LPF 0-5 (*)    All other components within normal limits  URINE CULTURE  LIPASE, BLOOD    Radiology Dg Chest 2 View  Result Date: 08/26/2016 CLINICAL DATA:  83 year old female with fever. EXAM: CHEST  2 VIEW COMPARISON:  Chest radiograph dated 06/11/2016 FINDINGS: There is emphysematous changes of the lungs with left lung base scarring. No focal consolidation, pleural effusion, or pneumothorax. The cardiac silhouette is within normal limits. Calcified mitral annulus and a mechanical aortic valve noted. There is calcification of the aortic arch. Osteopenia with degenerative changes of the spine. No acute osseous pathology. IMPRESSION: No active cardiopulmonary disease. Electronically Signed   By: Elgie Collard M.D.   On: 08/26/2016 01:14   Ct Abdomen Pelvis W Contrast  Result Date: 08/26/2016 CLINICAL DATA:  Abdominal pain, now resolved. History of urinary tract infection. EXAM: CT ABDOMEN AND PELVIS WITH CONTRAST TECHNIQUE: Multidetector CT imaging of the abdomen and pelvis was performed using the standard protocol following bolus administration of intravenous contrast. CONTRAST:  ISOVUE-300 IOPAMIDOL (ISOVUE-300) INJECTION 61% COMPARISON:  CT abdomen and pelvis June 03, 2014 and CT abdomen and pelvis January 06, 2014 FINDINGS: LOWER CHEST: Atelectasis. Heart size is normal . Status post no pericardial effusion. Aortic valve replacement. HEPATOBILIARY: Tiny layering gallstones or sludge without CT findings of acute cholecystitis. Similar appearance of multiple benign-appearing hepatic cysts. PANCREAS: Stable 12 mm cystic mass pancreatic head. SPLEEN: Normal. ADRENALS/URINARY TRACT: Kidneys are orthotopic, demonstrating symmetric enhancement. Mildly trip third RIGHT kidney. No nephrolithiasis, hydronephrosis or solid renal masses. Multifocal scarring RIGHT kidney. Similar mild LEFT hydronephrosis, duplicated LEFT renal collecting system better demonstrated on  prior CT. LEFT extra renal pelvis and, 3.2 cm LEFT lower pole cyst. Urinary bladder is partially distended unremarkable. Normal adrenal glands. STOMACH/BOWEL: The stomach, small and large bowel are normal in course and caliber without inflammatory changes. Moderate sigmoid diverticulosis. Normal appendix. VASCULAR/LYMPHATIC: Aortoiliac vessels are normal in course and caliber, severe calcific atherosclerosis. No lymphadenopathy by CT size criteria. REPRODUCTIVE: Round 8.9 cm fatty mass with coarse calcifications in the pelvis compatible with dermoid. OTHER: No intraperitoneal free fluid or free air. MUSCULOSKELETAL: Nonacute.  Osteopenia. IMPRESSION: No acute intra-abdominal or pelvic process. Mild chronic LEFT hydronephrosis. Duplicated LEFT urinary collecting system better characterize on prior CT. Stable appearance of large pelvic dermoid.  Severe atherosclerosis. Electronically Signed   By: Awilda Metro M.D.   On: 08/26/2016 00:54    Procedures Procedures (including critical care time)  Medications Ordered in ED Medications  iopamidol (ISOVUE-300) 61 % injection 100 mL (100 mLs Intravenous Contrast Given 08/26/16 0010)  cefTRIAXone (ROCEPHIN) 1 g in dextrose 5 % 50 mL IVPB (0 g Intravenous Stopped 08/26/16 0335)  acetaminophen (TYLENOL) tablet 650 mg (650 mg Oral Given 08/26/16 0342)     Initial Impression / Assessment and Plan / ED Course  I have reviewed the triage vital signs and the nursing notes.  Pertinent labs & imaging results that were available during my care of the patient were reviewed by me and considered in my medical decision making (see chart for details).     Vital signs reviewed and are as follows: Vitals:   08/26/16 0302 08/26/16 0342  BP:  (!) 141/73  Pulse:  93  Resp:  11  Temp: 100.3 F (37.9 C) 99.4 F (37.4 C)   Patient discussed with and seen by Dr. Criss Alvine.   I discussed case and findings with hospitalist Dr. Katrinka Blazing. We discussed whether or not  patient's current presentation and findings warrant admission to the hospital. From hospitalist perspective, patient does not have fever greater than 100.68F. She has had previous UTI that is been pansensitive to antibiotics. They feel that a trial of outpatient treatment would not be unreasonable. From ED provider perspective, patient does not appear toxic. Currently she has no complaints. She has not spiked a fever in emergency department. Low concern for sepsis. Will start on Keflex and discharged back to facility with strict return instructions and PCP follow-up for urine recheck. Dr. Criss Alvine has re-evaluated as well and is comfortable with this plan.    Final Clinical Impressions(s) / ED Diagnoses   Final diagnoses:  Acute cystitis without hematuria   Patient with likely urinary tract infection. Complaint of abdominal pain earlier today. No vomiting. Questionable low-grade fever. Here she has mild leukocytosis with UA suggesting UTI. Culture sent to confirm. Chest x-ray and abdominal CT are reassuring. Patient appears well, nontoxic. She is pleasantly confused but has no current complaints.  New Prescriptions New Prescriptions   CEPHALEXIN (KEFLEX) 500 MG CAPSULE    Take 1 capsule (500 mg total) by mouth 4 (four) times daily.   I personally performed the services described in this documentation, which was scribed in my presence. The recorded information has been reviewed and is accurate.    Renne Crigler, PA-C 08/26/16 0422    Pricilla Loveless, MD 08/26/16 [phone]

## 2016-08-25 NOTE — ED Triage Notes (Signed)
Per EMS, staff called out d/t patient complaining of abdominal pain.  Staff checked her temp and got 100.2 temp and called EMS.  Pt has no complaints and states she doesn't want to be here, however, pt has a hx of dementia.  Pt refused IV en route with EMS.

## 2016-08-26 ENCOUNTER — Emergency Department (HOSPITAL_COMMUNITY): Payer: Medicare Other

## 2016-08-26 DIAGNOSIS — R109 Unspecified abdominal pain: Secondary | ICD-10-CM | POA: Diagnosis not present

## 2016-08-26 DIAGNOSIS — R1084 Generalized abdominal pain: Secondary | ICD-10-CM | POA: Diagnosis not present

## 2016-08-26 DIAGNOSIS — R509 Fever, unspecified: Secondary | ICD-10-CM | POA: Diagnosis not present

## 2016-08-26 DIAGNOSIS — N3000 Acute cystitis without hematuria: Secondary | ICD-10-CM | POA: Diagnosis not present

## 2016-08-26 DIAGNOSIS — R531 Weakness: Secondary | ICD-10-CM | POA: Diagnosis not present

## 2016-08-26 LAB — URINALYSIS, ROUTINE W REFLEX MICROSCOPIC
BILIRUBIN URINE: NEGATIVE
Glucose, UA: NEGATIVE mg/dL
Ketones, ur: NEGATIVE mg/dL
NITRITE: NEGATIVE
Protein, ur: NEGATIVE mg/dL
SPECIFIC GRAVITY, URINE: 1.009 (ref 1.005–1.030)
pH: 7 (ref 5.0–8.0)

## 2016-08-26 MED ORDER — DEXTROSE 5 % IV SOLN
1.0000 g | Freq: Once | INTRAVENOUS | Status: AC
  Administered 2016-08-26: 1 g via INTRAVENOUS
  Filled 2016-08-26: qty 10

## 2016-08-26 MED ORDER — ACETAMINOPHEN 325 MG PO TABS
650.0000 mg | ORAL_TABLET | Freq: Once | ORAL | Status: AC
  Administered 2016-08-26: 650 mg via ORAL
  Filled 2016-08-26: qty 2

## 2016-08-26 MED ORDER — CEPHALEXIN 500 MG PO CAPS: 500.0000 mg | ORAL_CAPSULE | Freq: Four times a day (QID) | ORAL | 0 refills | Status: DC

## 2016-08-26 MED ORDER — IOPAMIDOL (ISOVUE-300) INJECTION 61%
100.0000 mL | Freq: Once | INTRAVENOUS | Status: AC | PRN
  Administered 2016-08-26: 100 mL via INTRAVENOUS

## 2016-08-26 NOTE — Discharge Instructions (Signed)
Please read and follow all provided instructions.  Your diagnoses today include:  1. Acute cystitis without hematuria     Tests performed today include:  Urine test - suggests that you have an infection in your bladder  Blood counts and electrolytes - elevated infection fighting cells  CT abdomen and pelvis - no concerning problems  Chest x-ray - no pneumonia  Urine culture - pending  Vital signs. See below for your results today.   Medications prescribed:   Keflex (cephalexin) - antibiotic  You have been prescribed an antibiotic medicine: take the entire course of medicine even if you are feeling better. Stopping early can cause the antibiotic not to work.  Home care instructions:  Follow any educational materials contained in this packet.  Follow-up instructions: Please follow-up with your primary care provider in 3 days for recheck of your urine.  Return instructions:   Please return to the Emergency Department if you experience worsening symptoms.   Return with fever, worsening pain, persistent vomiting, worsening pain in your back.   Please return if you have any other emergent concerns.  Additional Information:  Your vital signs today were: BP (!) 130/59 (BP Location: Right Arm)    Pulse 83    Temp 100.3 F (37.9 C) (Rectal)    Resp 18    SpO2 95%  If your blood pressure (BP) was elevated above 135/85 this visit, please have this repeated by your doctor within one month. --------------

## 2016-08-26 NOTE — ED Notes (Signed)
PTAR picked up patient and took back to facility.

## 2016-08-26 NOTE — ED Notes (Signed)
Patient is discharged. Waiting on [facility] to take her back to the memory care unit at [name].

## 2016-08-26 NOTE — ED Notes (Signed)
Bed: ZO10 Expected date:  Expected time:  Means of arrival:  Comments: Room 2

## 2016-08-27 LAB — URINE CULTURE

## 2016-08-29 ENCOUNTER — Encounter (INDEPENDENT_AMBULATORY_CARE_PROVIDER_SITE_OTHER): Payer: Medicare Other | Admitting: Ophthalmology

## 2016-08-29 DIAGNOSIS — I10 Essential (primary) hypertension: Secondary | ICD-10-CM | POA: Diagnosis not present

## 2016-08-29 DIAGNOSIS — H2512 Age-related nuclear cataract, left eye: Secondary | ICD-10-CM | POA: Diagnosis not present

## 2016-08-29 DIAGNOSIS — H35033 Hypertensive retinopathy, bilateral: Secondary | ICD-10-CM

## 2016-08-29 DIAGNOSIS — H353231 Exudative age-related macular degeneration, bilateral, with active choroidal neovascularization: Secondary | ICD-10-CM | POA: Diagnosis not present

## 2016-08-29 DIAGNOSIS — H43813 Vitreous degeneration, bilateral: Secondary | ICD-10-CM

## 2016-09-03 DIAGNOSIS — Z87440 Personal history of urinary (tract) infections: Secondary | ICD-10-CM | POA: Diagnosis not present

## 2016-09-03 DIAGNOSIS — G301 Alzheimer's disease with late onset: Secondary | ICD-10-CM | POA: Diagnosis not present

## 2016-09-29 ENCOUNTER — Emergency Department (HOSPITAL_COMMUNITY): Payer: Medicare Other

## 2016-09-29 ENCOUNTER — Emergency Department (HOSPITAL_COMMUNITY)
Admission: EM | Admit: 2016-09-29 | Discharge: 2016-09-29 | Disposition: A | Discharge status: Skilled Nursing Facility | Payer: Medicare Other | Attending: Emergency Medicine | Admitting: Emergency Medicine

## 2016-09-29 ENCOUNTER — Encounter (HOSPITAL_COMMUNITY): Payer: Self-pay | Admitting: Emergency Medicine

## 2016-09-29 DIAGNOSIS — Z87891 Personal history of nicotine dependence: Secondary | ICD-10-CM | POA: Diagnosis not present

## 2016-09-29 DIAGNOSIS — B999 Unspecified infectious disease: Secondary | ICD-10-CM | POA: Diagnosis not present

## 2016-09-29 DIAGNOSIS — I10 Essential (primary) hypertension: Secondary | ICD-10-CM | POA: Insufficient documentation

## 2016-09-29 DIAGNOSIS — R509 Fever, unspecified: Secondary | ICD-10-CM | POA: Diagnosis not present

## 2016-09-29 DIAGNOSIS — R531 Weakness: Secondary | ICD-10-CM | POA: Diagnosis present

## 2016-09-29 DIAGNOSIS — N390 Urinary tract infection, site not specified: Secondary | ICD-10-CM | POA: Insufficient documentation

## 2016-09-29 DIAGNOSIS — R102 Pelvic and perineal pain: Secondary | ICD-10-CM | POA: Diagnosis not present

## 2016-09-29 LAB — URINALYSIS, ROUTINE W REFLEX MICROSCOPIC
BILIRUBIN URINE: NEGATIVE
Glucose, UA: 50 mg/dL — AB
KETONES UR: NEGATIVE mg/dL
Nitrite: NEGATIVE
PROTEIN: 100 mg/dL — AB
SQUAMOUS EPITHELIAL / LPF: NONE SEEN
Specific Gravity, Urine: 1.019 (ref 1.005–1.030)
pH: 5 (ref 5.0–8.0)

## 2016-09-29 LAB — BASIC METABOLIC PANEL
ANION GAP: 8 (ref 5–15)
BUN: 19 mg/dL (ref 6–20)
CO2: 29 mmol/L (ref 22–32)
Calcium: 8.9 mg/dL (ref 8.9–10.3)
Chloride: 105 mmol/L (ref 101–111)
Creatinine, Ser: 0.89 mg/dL (ref 0.44–1.00)
GFR calc Af Amer: >60 mL/min (ref >60)
GFR, EST NON AFRICAN AMERICAN: 56 mL/min — AB (ref >60)
Glucose, Bld: 108 mg/dL — ABNORMAL HIGH (ref 65–99)
POTASSIUM: 3.4 mmol/L — AB (ref 3.5–5.1)
SODIUM: 142 mmol/L (ref 135–145)

## 2016-09-29 LAB — CBC
HCT: 39.7 % (ref 36.0–46.0)
Hemoglobin: 13 g/dL (ref 12.0–15.0)
MCH: 27.1 pg (ref 26.0–34.0)
MCHC: 32.7 g/dL (ref 30.0–36.0)
MCV: 82.9 fL (ref 78.0–100.0)
PLATELETS: 170 10*3/uL (ref 150–400)
RBC: 4.79 MIL/uL (ref 3.87–5.11)
RDW: 16 % — ABNORMAL HIGH (ref 11.5–15.5)
WBC: 10.9 10*3/uL — AB (ref 4.0–10.5)

## 2016-09-29 LAB — I-STAT CG4 LACTIC ACID, ED: Lactic Acid, Venous: 1.29 mmol/L (ref 0.5–1.9)

## 2016-09-29 MED ORDER — CEPHALEXIN 500 MG PO CAPS
500.0000 mg | ORAL_CAPSULE | Freq: Four times a day (QID) | ORAL | 0 refills | Status: AC
Start: 2016-09-29 — End: 2016-10-06

## 2016-09-29 MED ORDER — DEXTROSE 5 % IV SOLN
2.0000 g | Freq: Once | INTRAVENOUS | Status: AC
  Administered 2016-09-29: 2 g via INTRAVENOUS
  Filled 2016-09-29: qty 2

## 2016-09-29 MED ORDER — SODIUM CHLORIDE 0.9 % IV BOLUS (SEPSIS)
500.0000 mL | Freq: Once | INTRAVENOUS | Status: AC
  Administered 2016-09-29: 500 mL via INTRAVENOUS

## 2016-09-29 NOTE — Discharge Instructions (Signed)
-   Your urinalysis was positive for a urine infection - We have started you on antibiotics. Take these as prescribed. - If you develop worsening fever, flank pain, nausea/vomiting, or other concerning symptoms, please return to the ER immediately

## 2016-09-29 NOTE — ED Provider Notes (Signed)
[facility]-EMERGENCY DEPT Provider Note   CSN: [id number] Arrival date & time: 09/29/16  1157     History   Chief Complaint Chief Complaint  Patient presents with  . Weakness    HPI Rebecca [name] is a 83 y.o. female.  HPI   83 yo F with PMHx recurrent UTIs here with mild fatigue. Pt reports that she has a history of frequent, recurrent UTIs with generalized weakness. Over the past several days, pt has just had increasing fatigue. No specific pain. No dysuria or urinary frequency, no flank pain, but this is not unusual for her when she has UTIs. She has no other complaints. No CP, SOB, cough. No difficulty eating/drinking. She notified her facility who did not have a doc available today so was sent here. No fevers.  Past Medical History:  Diagnosis Date  . Age-related macular degeneration, wet, right eye (HCC)   . Aortic stenosis   . Aortic stenosis   . Dementia    "slight"/daughter 12/02/2013  . Dementia with Lewy bodies   . Depression   . Heart block   . Heart murmur    "slight"  . High cholesterol   . Hypertension   . Panic attacks   . Paroxysmal atrial fibrillation (HCC)   . S/P TAVR (transcatheter aortic valve replacement) 02/02/2014   23 mm Edwards Sapien XT transcatheter heart valve placed via open right transfemoral approach  . Syncope 08/21/2012  . UTI (lower urinary tract infection)     Patient Active Problem List   Diagnosis Date Noted  . E coli bacteremia 06/13/2016  . Sepsis secondary to UTI (HCC) 06/11/2016  . Moderate episode of recurrent major depressive disorder (HCC)   . Sleep-wake disorder   . Somnolence   . Decreased appetite   . Goals of care, counseling/discussion   . Palliative care by specialist   . Advance care planning   . Pre-syncope 04/16/2016  . Confusion 04/15/2016  . MDD (major depressive disorder), single episode, moderate (HCC) 07/04/2015  . S/P TAVR (transcatheter aortic valve replacement) 02/02/2014  . Severe aortic stenosis 02/02/2014    . Sinus tachycardia 12/04/2013  . Sinus bradycardia 12/04/2013  . Encounter for therapeutic drug monitoring 06/17/2013  . Mild dementia 11/06/2012  . Paroxysmal atrial fibrillation (HCC)   . PAF (paroxysmal atrial fibrillation) (HCC) 10/03/2012  . Long term (current) use of anticoagulants 10/03/2012  . Syncope 08/21/2012  . Aortic stenosis, severe 07/09/2012  . Near syncope 07/08/2012  . UTI (lower urinary tract infection) 07/08/2012  . BRONCHIECTASIS 12/09/2007  . SINUSITIS, CHRONIC 07/07/2007  . BRONCHITIS, CHRONIC 07/07/2007  . HYPERLIPIDEMIA 06/09/2007  . Essential hypertension 06/09/2007  . ALLERGIC RHINITIS 06/09/2007  . OSTEOPOROSIS 06/09/2007    Past Surgical History:  Procedure Laterality Date  . CARDIAC CATHETERIZATION  04/2013  . EYE SURGERY     cataracts  . INTRAOPERATIVE TRANSESOPHAGEAL ECHOCARDIOGRAM N/A 02/02/2014   Procedure: INTRAOPERATIVE TRANSESOPHAGEAL ECHOCARDIOGRAM;  Surgeon: Tonny BollmanMichael Cooper, MD;  [facility];  Service: Open Heart Surgery;  Laterality: N/A;  . LEFT AND RIGHT HEART CATHETERIZATION WITH CORONARY ANGIOGRAM N/A 05/25/2013   Procedure: LEFT AND RIGHT HEART CATHETERIZATION WITH CORONARY ANGIOGRAM;  Surgeon: [name] SwazilandJordan, MD;  [facility];  Service: Cardiovascular;  Laterality: N/A;  . TONSILLECTOMY    . TRANSCATHETER AORTIC VALVE REPLACEMENT, TRANSFEMORAL  02/02/2014   Edwards Sapien XT THV (size 23 mm, model # 9300TFX, serial # [id number])  . TRANSCATHETER AORTIC VALVE REPLACEMENT, TRANSFEMORAL N/A 02/02/2014   Procedure: TRANSCATHETER AORTIC  VALVE REPLACEMENT, TRANSFEMORAL;  Surgeon: Tonny Bollman, MD;  Location[facility] OR;  Service: Open Heart Surgery;  Laterality: N/A;    OB History    No data available       Home Medications    Prior to Admission medications   Medication Sig Start Date End Date Taking? Authorizing Provider  amLODipine (NORVASC) 5 MG tablet Take 1 tablet (5 mg total) by mouth daily with breakfast. 08/14/16  Yes  [name], [name], MD  ELIQUIS 2.5 MG TABS tablet TAKE 1 TABLET (2.5 MG TOTAL) BY MOUTH 2 (TWO) TIMES DAILY. 08/10/16  Yes [name], [name], MD  memantine (NAMENDA) 10 MG tablet Take 1 tablet (10 mg total) by mouth 2 (two) times daily. 01/09/16  Yes Penumalli, Glenford Bayley, MD  cephALEXin (KEFLEX) 500 MG capsule Take 1 capsule (500 mg total) by mouth 4 (four) times daily. 09/29/16 10/06/16  Shaune Pollack, MD  Cranberry 450 MG TABS Take 450 mg by mouth 2 (two) times daily.    [provider]  mirtazapine (REMERON) 30 MG tablet Take 30 mg by mouth at bedtime.    [provider]  NUTRITIONAL SUPPLEMENT LIQD Take 1 Bottle by mouth 3 (three) times daily with meals. *House Shake*    [provider]  sertraline (ZOLOFT) 100 MG tablet Take 100 mg by mouth daily with breakfast.  07/20/15   [provider]    Family History Family History  Problem Relation Age of Onset  . Heart attack Mother 56  . Heart Problems Father   . Heart attack Father 45  . Cerebral aneurysm Sister 40  . Sudden death Brother 89    Social History Social History  Substance Use Topics  . Smoking status: Former Smoker    Packs/day: 0.40    Years: 30.00    Types: Cigarettes    Quit date: 04/30/1960  . Smokeless tobacco: Never Used  . Alcohol use No     Allergies   Patient has no known allergies.   Review of Systems Review of Systems  Constitutional: Positive for fatigue. Negative for chills and fever.  HENT: Negative for congestion, rhinorrhea and sore throat.   Eyes: Negative for visual disturbance.  Respiratory: Negative for cough, shortness of breath and wheezing.   Cardiovascular: Negative for chest pain and leg swelling.  Gastrointestinal: Negative for abdominal pain, diarrhea, nausea and vomiting.  Genitourinary: Negative for dysuria, flank pain, vaginal bleeding and vaginal discharge.  Musculoskeletal: Negative for neck pain.  Skin: Negative for rash.  Allergic/Immunologic:  Negative for immunocompromised state.  Neurological: Negative for syncope and headaches.  Hematological: Does not bruise/bleed easily.  All other systems reviewed and are negative.    Physical Exam Updated Vital Signs BP (!) 155/78 (BP Location: Left Arm)   Pulse 90   Temp 98.6 F (37 C) (Oral)   Resp 16   SpO2 96%   Physical Exam  Constitutional: She is oriented to person, place, and time. She appears well-developed and well-nourished. No distress.  HENT:  Head: Normocephalic and atraumatic.  Eyes: Conjunctivae are normal.  Neck: Neck supple.  Cardiovascular: Normal rate, regular rhythm and normal heart sounds.  Exam reveals no friction rub.   No murmur heard. Pulmonary/Chest: Effort normal and breath sounds normal. No respiratory distress. She has no wheezes. She has no rales.  Abdominal: Soft. Bowel sounds are normal. She exhibits no distension. There is no tenderness. There is no guarding.  No CVAT bilaterally  Musculoskeletal: She exhibits no edema.  Neurological:  She is alert and oriented to person, place, and time. She exhibits normal muscle tone.  Skin: Skin is warm. Capillary refill takes less than 2 seconds.  Psychiatric: She has a normal mood and affect.  Nursing note and vitals reviewed.    ED Treatments / Results  Labs (all labs ordered are listed, but only abnormal results are displayed) Labs Reviewed  URINALYSIS, ROUTINE W REFLEX MICROSCOPIC - Abnormal; Notable for the following:       Result Value   APPearance TURBID (*)    Glucose, UA 50 (*)    Hgb urine dipstick SMALL (*)    Protein, ur 100 (*)    Leukocytes, UA LARGE (*)    Bacteria, UA RARE (*)    All other components within normal limits  CBC - Abnormal; Notable for the following:    WBC 10.9 (*)    RDW 16.0 (*)    All other components within normal limits  BASIC METABOLIC PANEL - Abnormal; Notable for the following:    Potassium 3.4 (*)    Glucose, Bld 108 (*)    GFR calc non Af Amer 56 (*)     All other components within normal limits  URINE CULTURE  I-STAT CG4 LACTIC ACID, ED    EKG  EKG Interpretation None       Radiology Dg Chest Portable 1 View  Result Date: 09/29/2016 CLINICAL DATA:  Decreased urine output with pelvic pain. EXAM: PORTABLE CHEST 1 VIEW COMPARISON:  08/26/2016. FINDINGS: 1213 hours. Cardiopericardial silhouette is at upper limits of normal for size. Interstitial markings are diffusely coarsened with chronic features. Wedge-shaped opacity overlying the cardiac apex may be related to superimposition of shadows including a skin fold although atelectasis could have this appearance. Patient is status post cardiac valve surgery. Bones diffusely demineralized. IMPRESSION: Opacity at the left base likely a confluence of shadows and superimposed skin fold. Dedicated upright PA and lateral x-ray recommended when patient is able. Electronically Signed   By: Kennith Center M.D.   On: 09/29/2016 12:45    Procedures Procedures (including critical care time)  Medications Ordered in ED Medications  cefTRIAXone (ROCEPHIN) 2 g in dextrose 5 % 50 mL IVPB (0 g Intravenous Stopped 09/29/16 1535)  sodium chloride 0.9 % bolus 500 mL (500 mLs Intravenous New Bag/Given 09/29/16 1458)     Initial Impression / Assessment and Plan / ED Course  I have reviewed the triage vital signs and the nursing notes.  Pertinent labs & imaging results that were available during my care of the patient were reviewed by me and considered in my medical decision making (see chart for details).     83 yo F with PMHx as above here with generalized fatigue. UA + for UTI. Pt has extensive h/o same with similar complaints, with pan sensitivie E. Coli on Cx and no difficulty with PO courses. Pt o/w well appearing. She has a mild leukocytosis but no fevers, no flank pain, no n/v, no signs to suggest pyelo. Renal function at baseline. Pt given Rocephin here and will d/c with outpt meds.  Final Clinical  Impressions(s) / ED Diagnoses   Final diagnoses:  Lower urinary tract infectious disease    New Prescriptions New Prescriptions   CEPHALEXIN (KEFLEX) 500 MG CAPSULE    Take 1 capsule (500 mg total) by mouth 4 (four) times daily.     Shaune Pollack, MD 09/29/16 1630

## 2016-09-29 NOTE — ED Notes (Signed)
Pt ambulatory with standby assist.

## 2016-09-29 NOTE — ED Triage Notes (Addendum)
Per EMS[facility]. Hx of dementia, oriented per norm. Facility sent pt here to rule out UTI. Hx of recurrent UTIs. Pt reports she feels weak, which is usually the sign she is getting a UTI. Denied any pain. Pt ambulatory to restroom with 1 assist. Pt feels feelings of frequency, but then cannot go.

## 2016-09-30 LAB — URINE CULTURE

## 2016-10-02 DIAGNOSIS — Z87440 Personal history of urinary (tract) infections: Secondary | ICD-10-CM | POA: Diagnosis not present

## 2016-10-02 DIAGNOSIS — G301 Alzheimer's disease with late onset: Secondary | ICD-10-CM | POA: Diagnosis not present

## 2016-10-27 DIAGNOSIS — N390 Urinary tract infection, site not specified: Secondary | ICD-10-CM | POA: Diagnosis not present

## 2016-10-29 DIAGNOSIS — G301 Alzheimer's disease with late onset: Secondary | ICD-10-CM | POA: Diagnosis not present

## 2016-10-29 DIAGNOSIS — Z87440 Personal history of urinary (tract) infections: Secondary | ICD-10-CM | POA: Diagnosis not present

## 2016-10-31 NOTE — Progress Notes (Signed)
Rebecca [name] Date of Birth: 03-06-1926 Medical Record #[id number]  History of Present Illness: Rebecca [name] is seen for followup s/p TAVR. She has paroxysmal atrial fibrillation and severe aortic stenosis. She is now on Eliquis. She is s/p TAVR on 02/02/14 from a transfemoral approach. She did very well from this procedure. Follow up Echo in November 2016  Showed EF 50-55%. Mean AV gradient of 22 mm Hg. Mild mitral stenosis. She is seen with her daughter today. Since her husband passed away she still has issues with major depression. Under the care of Dr. Donell BeersPlovsky. She is more forgetful. She is going to Adult day care 5 days/wk and cannot be left alone.   She was admitted in December 2017 with a fall associated with prolonged confusion. Also episodes of unresponsiveness/ staring into space associated with eye fluttering. Possible seizure although EEG negative. Started on Depakote. Also felt to be dehydrated. Noted to have transient episode of complete heart block without symptoms in hospital. Seen by EP. Recommended stopping Aricept and avoiding rate slowing meds. Conservative therapy recommended and patient/family clearly did not want pacemaker.    She was admitted in February with syncope in setting of sepsis with E. Coli bacteremia and UTI. No arrhythmia noted.  Seen in ED on March 29 with fall after tripping and hit her head with contusion. CT negative for IC bleed.  Seen in ED April 28 with abdominal pain and acute cystitis. Treated with antibiotics as outpatient.   Seen in ED on June 2 with generalized weakness and again treated for UTI.   On follow up today she states she is feeling very well. No chest pain, dyspnea, dizziness, syncope, palpitations. Her only complaint is that she feels sleepy. Walking with a walker. No fever, chills, dysuria.   Current Outpatient Prescriptions on File Prior to Visit  Medication Sig Dispense Refill  . amLODipine (NORVASC) 5 MG tablet Take 1 tablet (5 mg  total) by mouth daily with breakfast. 90 tablet 1  . Cranberry 450 MG TABS Take 450 mg by mouth 2 (two) times daily.    [name]. ELIQUIS 2.5 MG TABS tablet TAKE 1 TABLET (2.5 MG TOTAL) BY MOUTH 2 (TWO) TIMES DAILY. 180 tablet 1  . memantine (NAMENDA) 10 MG tablet Take 1 tablet (10 mg total) by mouth 2 (two) times daily. 180 tablet 4  . mirtazapine (REMERON) 30 MG tablet Take 30 mg by mouth at bedtime.    [name]. NUTRITIONAL SUPPLEMENT LIQD Take 1 Bottle by mouth 3 (three) times daily with meals. *House Shake*    . sertraline (ZOLOFT) 100 MG tablet Take 100 mg by mouth daily with breakfast.   5   No current facility-administered medications on file prior to visit.     No Known Allergies  Past Medical History:  Diagnosis Date  . Age-related macular degeneration, wet, right eye (HCC)   . Aortic stenosis   . Aortic stenosis   . Dementia    "slight"/daughter 12/02/2013  . Dementia with Lewy bodies   . Depression   . Heart block   . Heart murmur    "slight"  . High cholesterol   . Hypertension   . Panic attacks   . Paroxysmal atrial fibrillation (HCC)   . S/P TAVR (transcatheter aortic valve replacement) 02/02/2014   23 mm Edwards Sapien XT transcatheter heart valve placed via open right transfemoral approach  . Syncope 08/21/2012  . UTI (lower urinary tract infection)     Past Surgical History:  Procedure Laterality  Date  . CARDIAC CATHETERIZATION  04/2013  . EYE SURGERY     cataracts  . INTRAOPERATIVE TRANSESOPHAGEAL ECHOCARDIOGRAM N/A 02/02/2014   Procedure: INTRAOPERATIVE TRANSESOPHAGEAL ECHOCARDIOGRAM;  Surgeon: Tonny Bollman, MD;  Location[facility] OR;  Service: Open Heart Surgery;  Laterality: N/A;  . LEFT AND RIGHT HEART CATHETERIZATION WITH CORONARY ANGIOGRAM N/A 05/25/2013   Procedure: LEFT AND RIGHT HEART CATHETERIZATION WITH CORONARY ANGIOGRAM;  Surgeon: Kourtland Coopman M [name], MD;  Location: [facility] CATH LAB;  Service: Cardiovascular;  Laterality: N/A;  . TONSILLECTOMY    . TRANSCATHETER AORTIC VALVE  REPLACEMENT, TRANSFEMORAL  02/02/2014   Edwards Sapien XT THV (size 23 mm, model # 9300TFX, serial # [id number])  . TRANSCATHETER AORTIC VALVE REPLACEMENT, TRANSFEMORAL N/A 02/02/2014   Procedure: TRANSCATHETER AORTIC VALVE REPLACEMENT, TRANSFEMORAL;  Surgeon: Tonny Bollman, MD;  Location: [facility] OR;  Service: Open Heart Surgery;  Laterality: N/A;    History  Smoking Status  . Former Smoker  . Packs/day: 0.40  . Years: 30.00  . Types: Cigarettes  . Quit date: 04/30/1960  Smokeless Tobacco  . Never Used    History  Alcohol Use No    Family History  Problem Relation Age of Onset  . Heart attack Mother 63  . Heart Problems Father   . Heart attack Father 47  . Cerebral aneurysm Sister 1  . Sudden death Brother 63    Review of Systems: The review of systems is positive for chronic memory loss. She is on Aricept.  All other systems were reviewed and are negative.  Physical Exam: BP 114/76   Pulse 90   Ht 5\' 2"  (1.575 m)   Wt 132 lb (59.9 kg)   BMI 24.14 kg/m  She is a pleasant elderly white female in no acute distress. HEENT: Normal. There is good dental care. Neck is supple without JVD, adenopathy, or thyromegaly. Carotid upstrokes are normal. Lungs: Clear Cardiovascular: Regular rate and rhythm. Normal S1 with diminished S2. There is a soft grade 1/6 systolic murmur at the right upper sternal border. Abdomen: Soft and nontender. No masses or bruits. Bowel sounds positive. Extremities: Normal radial, femoral, and pedal pulses. No cyanosis or edema.  Skin: Dry Neuro: Alert and oriented x3. Cranial nerves II through XII are intact. Mood is good  LABORATORY DATA:  Lab Results  Component Value Date   WBC 10.9 (H) 09/29/2016   HGB 13.0 09/29/2016   HCT 39.7 09/29/2016   PLT 170 09/29/2016   GLUCOSE 108 (H) 09/29/2016   ALT 17 08/25/2016   AST 18 08/25/2016   NA 142 09/29/2016   K 3.4 (L) 09/29/2016   CL 105 09/29/2016   CREATININE 0.89 09/29/2016   BUN 19 09/29/2016    CO2 29 09/29/2016   TSH 1.400 12/02/2013   INR 1.2 10/19/2014   HGBA1C 6.0 (H) 01/29/2014   Labs dated 11/30/15: cholesterol 165, triglycerides 146, HDL 54, LDL 81. CMET normal.  Assessment / Plan: 1.  Aortic stenosis: Severe. Now s/p TAVR with excellent result.   2. Major depression. Currently doing well.  3.  Paroxysmal atrial fibrillation.  on Eliquis. No significant sustained high rates.  4. Hyperlipidemia.  5. Hypertension-controlled  6. Chronic diastolic CHF well compensated.   7. Recurrent UTI.  8. Transient CHB in setting of sepsis and on Aricept. No symptomatic recurrence.   Continue current therapy  I will follow up in 6 months.

## 2016-11-01 ENCOUNTER — Encounter: Payer: Self-pay | Admitting: Cardiology

## 2016-11-01 ENCOUNTER — Ambulatory Visit (INDEPENDENT_AMBULATORY_CARE_PROVIDER_SITE_OTHER): Payer: Medicare Other | Admitting: Cardiology

## 2016-11-01 VITALS — BP 114/76 | HR 90 | Ht 62.0 in | Wt 132.0 lb

## 2016-11-01 DIAGNOSIS — I480 Paroxysmal atrial fibrillation: Secondary | ICD-10-CM

## 2016-11-01 DIAGNOSIS — I10 Essential (primary) hypertension: Secondary | ICD-10-CM

## 2016-11-01 DIAGNOSIS — I350 Nonrheumatic aortic (valve) stenosis: Secondary | ICD-10-CM

## 2016-11-01 DIAGNOSIS — Z952 Presence of prosthetic heart valve: Secondary | ICD-10-CM

## 2016-11-01 NOTE — Patient Instructions (Signed)
Continue your current therapy  I will see you in 6 months.   

## 2016-11-02 ENCOUNTER — Telehealth: Payer: Self-pay | Admitting: Cardiology

## 2016-11-02 NOTE — Telephone Encounter (Signed)
Returned call to patient's daughter Rebecca [name].Dr.Jordan advised at 11/01/16 office visit no changes,continue same medications.

## 2016-11-02 NOTE — Telephone Encounter (Signed)
New Message  Pt daughter call requesting to speak with RN to see how pt appt went on 7/5. Please call back to discuss

## 2016-11-08 ENCOUNTER — Other Ambulatory Visit: Payer: Self-pay | Admitting: Cardiology

## 2016-11-09 DIAGNOSIS — R3915 Urgency of urination: Secondary | ICD-10-CM | POA: Diagnosis not present

## 2016-11-12 ENCOUNTER — Emergency Department (HOSPITAL_COMMUNITY): Payer: Medicare Other

## 2016-11-12 ENCOUNTER — Encounter (HOSPITAL_COMMUNITY): Payer: Self-pay

## 2016-11-12 ENCOUNTER — Inpatient Hospital Stay (HOSPITAL_COMMUNITY)
Admission: EM | Admit: 2016-11-12 | Discharge: 2016-11-15 | DRG: 871 | Disposition: A | Discharge status: Skilled Nursing Facility | Payer: Medicare Other | Attending: Internal Medicine | Admitting: Internal Medicine

## 2016-11-12 DIAGNOSIS — W19XXXA Unspecified fall, initial encounter: Secondary | ICD-10-CM | POA: Diagnosis present

## 2016-11-12 DIAGNOSIS — J189 Pneumonia, unspecified organism: Secondary | ICD-10-CM | POA: Diagnosis not present

## 2016-11-12 DIAGNOSIS — Z79899 Other long term (current) drug therapy: Secondary | ICD-10-CM | POA: Diagnosis not present

## 2016-11-12 DIAGNOSIS — A4151 Sepsis due to Escherichia coli [E. coli]: Secondary | ICD-10-CM | POA: Diagnosis not present

## 2016-11-12 DIAGNOSIS — S199XXA Unspecified injury of neck, initial encounter: Secondary | ICD-10-CM | POA: Diagnosis not present

## 2016-11-12 DIAGNOSIS — Z66 Do not resuscitate: Secondary | ICD-10-CM | POA: Diagnosis present

## 2016-11-12 DIAGNOSIS — I480 Paroxysmal atrial fibrillation: Secondary | ICD-10-CM | POA: Diagnosis present

## 2016-11-12 DIAGNOSIS — Y92129 Unspecified place in nursing home as the place of occurrence of the external cause: Secondary | ICD-10-CM

## 2016-11-12 DIAGNOSIS — S0003XA Contusion of scalp, initial encounter: Secondary | ICD-10-CM | POA: Diagnosis present

## 2016-11-12 DIAGNOSIS — N390 Urinary tract infection, site not specified: Secondary | ICD-10-CM | POA: Diagnosis present

## 2016-11-12 DIAGNOSIS — G4489 Other headache syndrome: Secondary | ICD-10-CM | POA: Diagnosis not present

## 2016-11-12 DIAGNOSIS — Y95 Nosocomial condition: Secondary | ICD-10-CM | POA: Diagnosis present

## 2016-11-12 DIAGNOSIS — E876 Hypokalemia: Secondary | ICD-10-CM | POA: Diagnosis present

## 2016-11-12 DIAGNOSIS — F410 Panic disorder [episodic paroxysmal anxiety] without agoraphobia: Secondary | ICD-10-CM | POA: Diagnosis present

## 2016-11-12 DIAGNOSIS — Z952 Presence of prosthetic heart valve: Secondary | ICD-10-CM

## 2016-11-12 DIAGNOSIS — F0390 Unspecified dementia without behavioral disturbance: Secondary | ICD-10-CM | POA: Diagnosis present

## 2016-11-12 DIAGNOSIS — I10 Essential (primary) hypertension: Secondary | ICD-10-CM | POA: Diagnosis not present

## 2016-11-12 DIAGNOSIS — R509 Fever, unspecified: Secondary | ICD-10-CM | POA: Diagnosis not present

## 2016-11-12 DIAGNOSIS — F0280 Dementia in other diseases classified elsewhere without behavioral disturbance: Secondary | ICD-10-CM | POA: Diagnosis present

## 2016-11-12 DIAGNOSIS — H3530 Unspecified macular degeneration: Secondary | ICD-10-CM | POA: Diagnosis present

## 2016-11-12 DIAGNOSIS — S0990XA Unspecified injury of head, initial encounter: Secondary | ICD-10-CM | POA: Diagnosis not present

## 2016-11-12 DIAGNOSIS — Z87891 Personal history of nicotine dependence: Secondary | ICD-10-CM | POA: Diagnosis not present

## 2016-11-12 DIAGNOSIS — G3183 Dementia with Lewy bodies: Secondary | ICD-10-CM | POA: Diagnosis present

## 2016-11-12 DIAGNOSIS — A419 Sepsis, unspecified organism: Secondary | ICD-10-CM

## 2016-11-12 DIAGNOSIS — F329 Major depressive disorder, single episode, unspecified: Secondary | ICD-10-CM | POA: Diagnosis present

## 2016-11-12 DIAGNOSIS — R339 Retention of urine, unspecified: Secondary | ICD-10-CM

## 2016-11-12 DIAGNOSIS — D696 Thrombocytopenia, unspecified: Secondary | ICD-10-CM | POA: Diagnosis present

## 2016-11-12 DIAGNOSIS — I459 Conduction disorder, unspecified: Secondary | ICD-10-CM | POA: Diagnosis present

## 2016-11-12 DIAGNOSIS — R000 Tachycardia, unspecified: Secondary | ICD-10-CM | POA: Diagnosis not present

## 2016-11-12 DIAGNOSIS — N179 Acute kidney failure, unspecified: Secondary | ICD-10-CM | POA: Diagnosis present

## 2016-11-12 DIAGNOSIS — I350 Nonrheumatic aortic (valve) stenosis: Secondary | ICD-10-CM | POA: Diagnosis present

## 2016-11-12 DIAGNOSIS — Z7901 Long term (current) use of anticoagulants: Secondary | ICD-10-CM | POA: Diagnosis not present

## 2016-11-12 DIAGNOSIS — N281 Cyst of kidney, acquired: Secondary | ICD-10-CM | POA: Diagnosis not present

## 2016-11-12 DIAGNOSIS — Z87440 Personal history of urinary (tract) infections: Secondary | ICD-10-CM | POA: Diagnosis not present

## 2016-11-12 DIAGNOSIS — L988 Other specified disorders of the skin and subcutaneous tissue: Secondary | ICD-10-CM

## 2016-11-12 DIAGNOSIS — R4182 Altered mental status, unspecified: Secondary | ICD-10-CM | POA: Diagnosis not present

## 2016-11-12 DIAGNOSIS — R296 Repeated falls: Secondary | ICD-10-CM | POA: Diagnosis not present

## 2016-11-12 DIAGNOSIS — B999 Unspecified infectious disease: Secondary | ICD-10-CM | POA: Diagnosis not present

## 2016-11-12 DIAGNOSIS — R6520 Severe sepsis without septic shock: Secondary | ICD-10-CM | POA: Diagnosis not present

## 2016-11-12 DIAGNOSIS — G301 Alzheimer's disease with late onset: Secondary | ICD-10-CM | POA: Diagnosis not present

## 2016-11-12 DIAGNOSIS — B9620 Unspecified Escherichia coli [E. coli] as the cause of diseases classified elsewhere: Secondary | ICD-10-CM | POA: Diagnosis present

## 2016-11-12 LAB — URINALYSIS, ROUTINE W REFLEX MICROSCOPIC
BILIRUBIN URINE: NEGATIVE
Glucose, UA: NEGATIVE mg/dL
Ketones, ur: NEGATIVE mg/dL
NITRITE: POSITIVE — AB
PH: 6 (ref 5.0–8.0)
Protein, ur: 100 mg/dL — AB
SPECIFIC GRAVITY, URINE: 1.006 (ref 1.005–1.030)
SQUAMOUS EPITHELIAL / LPF: NONE SEEN

## 2016-11-12 LAB — COMPREHENSIVE METABOLIC PANEL
ALK PHOS: 100 U/L (ref 38–126)
ALT: 15 U/L (ref 14–54)
AST: 26 U/L (ref 15–41)
Albumin: 3.2 g/dL — ABNORMAL LOW (ref 3.5–5.0)
Anion gap: 9 (ref 5–15)
BILIRUBIN TOTAL: 0.9 mg/dL (ref 0.3–1.2)
BUN: 13 mg/dL (ref 6–20)
CALCIUM: 8.3 mg/dL — AB (ref 8.9–10.3)
CO2: 26 mmol/L (ref 22–32)
CREATININE: 0.91 mg/dL (ref 0.44–1.00)
Chloride: 102 mmol/L (ref 101–111)
GFR calc Af Amer: >60 mL/min (ref >60)
GFR calc non Af Amer: 55 mL/min — ABNORMAL LOW (ref >60)
GLUCOSE: 147 mg/dL — AB (ref 65–99)
Potassium: 3 mmol/L — ABNORMAL LOW (ref 3.5–5.1)
Sodium: 137 mmol/L (ref 135–145)
TOTAL PROTEIN: 6.6 g/dL (ref 6.5–8.1)

## 2016-11-12 LAB — CBC WITH DIFFERENTIAL/PLATELET
BASOS ABS: 0 10*3/uL (ref 0.0–0.1)
BASOS PCT: 0 %
EOS PCT: 0 %
Eosinophils Absolute: 0 10*3/uL (ref 0.0–0.7)
HCT: 38.9 % (ref 36.0–46.0)
Hemoglobin: 12.7 g/dL (ref 12.0–15.0)
Lymphocytes Relative: 12 %
Lymphs Abs: 1.7 10*3/uL (ref 0.7–4.0)
MCH: 27.7 pg (ref 26.0–34.0)
MCHC: 32.6 g/dL (ref 30.0–36.0)
MCV: 84.7 fL (ref 78.0–100.0)
MONO ABS: 1.3 10*3/uL — AB (ref 0.1–1.0)
Monocytes Relative: 9 %
Neutro Abs: 11.3 10*3/uL — ABNORMAL HIGH (ref 1.7–7.7)
Neutrophils Relative %: 79 %
PLATELETS: 134 10*3/uL — AB (ref 150–400)
RBC: 4.59 MIL/uL (ref 3.87–5.11)
RDW: 16.6 % — AB (ref 11.5–15.5)
WBC: 14.3 10*3/uL — ABNORMAL HIGH (ref 4.0–10.5)

## 2016-11-12 LAB — I-STAT CG4 LACTIC ACID, ED: Lactic Acid, Venous: 1.54 mmol/L (ref 0.5–1.9)

## 2016-11-12 MED ORDER — SODIUM CHLORIDE 0.9 % IV SOLN
1000.0000 mL | INTRAVENOUS | Status: DC
  Administered 2016-11-12: 1000 mL via INTRAVENOUS

## 2016-11-12 MED ORDER — ACETAMINOPHEN 325 MG PO TABS: 650.0000 mg | ORAL_TABLET | Freq: Once | ORAL | Status: DC

## 2016-11-12 MED ORDER — VANCOMYCIN HCL IN DEXTROSE 750-5 MG/150ML-% IV SOLN
750.0000 mg | INTRAVENOUS | Status: DC
  Administered 2016-11-13: 750 mg via INTRAVENOUS
  Filled 2016-11-12 (×2): qty 150

## 2016-11-12 MED ORDER — ACETAMINOPHEN 325 MG PO TABS
650.0000 mg | ORAL_TABLET | Freq: Four times a day (QID) | ORAL | Status: DC | PRN
  Filled 2016-11-12: qty 2

## 2016-11-12 MED ORDER — ACETAMINOPHEN 650 MG RE SUPP
650.0000 mg | Freq: Once | RECTAL | Status: AC
  Administered 2016-11-12: 650 mg via RECTAL
  Filled 2016-11-12: qty 1

## 2016-11-12 MED ORDER — AMLODIPINE BESYLATE 5 MG PO TABS
5.0000 mg | ORAL_TABLET | Freq: Every day | ORAL | Status: DC
  Administered 2016-11-13 – 2016-11-15 (×3): 5 mg via ORAL
  Filled 2016-11-12 (×3): qty 1

## 2016-11-12 MED ORDER — SERTRALINE HCL 100 MG PO TABS
100.0000 mg | ORAL_TABLET | Freq: Every day | ORAL | Status: DC
  Administered 2016-11-13 – 2016-11-15 (×3): 100 mg via ORAL
  Filled 2016-11-12 (×4): qty 1

## 2016-11-12 MED ORDER — ALUM & MAG HYDROXIDE-SIMETH 200-200-20 MG/5ML PO SUSP: 15.0000 mL | Freq: Four times a day (QID) | ORAL | Status: DC | PRN

## 2016-11-12 MED ORDER — SODIUM CHLORIDE 0.9 % IV BOLUS (SEPSIS)
1000.0000 mL | Freq: Once | INTRAVENOUS | Status: AC
  Administered 2016-11-12: 1000 mL via INTRAVENOUS

## 2016-11-12 MED ORDER — MIRTAZAPINE 30 MG PO TABS
30.0000 mg | ORAL_TABLET | Freq: Every day | ORAL | Status: DC
  Administered 2016-11-12 – 2016-11-14 (×3): 30 mg via ORAL
  Filled 2016-11-12 (×4): qty 1

## 2016-11-12 MED ORDER — POTASSIUM CHLORIDE CRYS ER 20 MEQ PO TBCR
30.0000 meq | EXTENDED_RELEASE_TABLET | Freq: Once | ORAL | Status: AC
  Administered 2016-11-12: 30 meq via ORAL
  Filled 2016-11-12: qty 1

## 2016-11-12 MED ORDER — PIPERACILLIN-TAZOBACTAM 3.375 G IVPB: 3.3750 g | Freq: Three times a day (TID) | INTRAVENOUS | Status: DC

## 2016-11-12 MED ORDER — POTASSIUM CHLORIDE IN NACL 40-0.9 MEQ/L-% IV SOLN
INTRAVENOUS | Status: AC
  Administered 2016-11-12: 75 mL/h via INTRAVENOUS
  Filled 2016-11-12: qty 1000

## 2016-11-12 MED ORDER — DEXTROSE 5 % IV SOLN
1.0000 g | INTRAVENOUS | Status: DC
  Administered 2016-11-12 – 2016-11-14 (×3): 1 g via INTRAVENOUS
  Filled 2016-11-12 (×4): qty 1

## 2016-11-12 MED ORDER — APIXABAN 2.5 MG PO TABS
2.5000 mg | ORAL_TABLET | Freq: Two times a day (BID) | ORAL | Status: DC
  Administered 2016-11-12 – 2016-11-15 (×6): 2.5 mg via ORAL
  Filled 2016-11-12 (×6): qty 1

## 2016-11-12 MED ORDER — VANCOMYCIN HCL IN DEXTROSE 1-5 GM/200ML-% IV SOLN
1000.0000 mg | Freq: Once | INTRAVENOUS | Status: AC
  Administered 2016-11-12: 1000 mg via INTRAVENOUS
  Filled 2016-11-12: qty 200

## 2016-11-12 MED ORDER — MEMANTINE HCL 10 MG PO TABS
10.0000 mg | ORAL_TABLET | Freq: Two times a day (BID) | ORAL | Status: DC
Start: 2016-11-12 — End: 2016-11-15
  Administered 2016-11-12 – 2016-11-15 (×6): 10 mg via ORAL
  Filled 2016-11-12 (×6): qty 1

## 2016-11-12 MED ORDER — HYDROCODONE-ACETAMINOPHEN 5-325 MG PO TABS: 1.0000 | ORAL_TABLET | Freq: Four times a day (QID) | ORAL | Status: DC | PRN

## 2016-11-12 MED ORDER — PIPERACILLIN-TAZOBACTAM 3.375 G IVPB 30 MIN
3.3750 g | Freq: Once | INTRAVENOUS | Status: AC
  Administered 2016-11-12: 3.375 g via INTRAVENOUS
  Filled 2016-11-12: qty 50

## 2016-11-12 NOTE — ED Notes (Addendum)
Patient transported to X-ray, will do in and out when pt returns

## 2016-11-12 NOTE — ED Triage Notes (Addendum)
Per EMS [facility]- EMS found patient laying on floor left lateral. Patient normally ambulatory without assistance at baseline. Patient had unwitnessed fall. Patient has dementia and per staff is at baseline. Patient and staff do not no cause of fall. Patient endorses pain in posterior scalp with small hematoma. No neuro deficits noted. Patient neck supported with towel due to small size. Pt takes blood thinners- eliquis.

## 2016-11-12 NOTE — H&P (Signed)
History and Physical    Rebecca [name] WUJ:[id number] DOB: 1926/01/04 DOA: 11/12/2016  PCP: Daisy Floro, MD   Patient coming from: SNF   Chief Complaint: Found down at SNF   HPI: Rebecca [name] is a 83 y.o. female with medical history significant for dementia, depression, paroxysmal atrial fibrillation on Eliquis, and hypertension, now presenting to the emergency department after being found on the floor at her nursing facility. Patient had been seen by staff earlier in the day and seemed to be in her usual state, but was later discovered laying on the floor and unable to describe what it happened. She had not been voicing any complaints. Mental status was reportedly at her baseline per report of nursing facility staff. There had not been any cough, respiratory distress, vomiting, or diarrhea reported. Patient is reported to have recurrent UTIs.  ED Course: Upon arrival to the ED, patient is found to be febrile to 39.3 C, saturating adequately on room air, tachypneic to 29, tachycardic in 110s, and was stable blood pressure. EKG features a sinus tachycardia with rate 105 and PACs. Chest x-ray is notable for new patchy parahilar airspace opacity possibly suggestive of pneumonia. Radiographs of the hips are negative. Noncontrast head CT demonstrates a posterior scalp hematoma without any underlying fracture or intracranial hemorrhage. Cervical spine CT is negative for acute pathology. Chemistry panel reveals a potassium of 3.0 and CBC is notable for leukocytosis to 14,300 and a slight thrombocytopenia with platelets 134,000. Lactic acid is reassuring at 1.54. Urinalysis is suggestive of infection. Blood cultures were obtained in the ED, order has been placed to culture the urine. Patient was treated with 2 L normal saline, acetaminophen, and started on empiric vancomycin and Zosyn. Tachycardia resolved with fluid bolus and fever resolved with acetaminophen. Patient will be admitted to the telemetry  unit for ongoing evaluation and management of sepsis suspected secondary to UTI and/or HCAP.   Review of Systems:  Unable to complete ROS secondary to the patient's clinical condition with dementia.  Past Medical History:  Diagnosis Date  . Age-related macular degeneration, wet, right eye (HCC)   . Aortic stenosis   . Aortic stenosis   . Dementia    "slight"/daughter 12/02/2013  . Dementia with Lewy bodies   . Depression   . Heart block   . Heart murmur    "slight"  . High cholesterol   . Hypertension   . Panic attacks   . Paroxysmal atrial fibrillation (HCC)   . S/P TAVR (transcatheter aortic valve replacement) 02/02/2014   23 mm Edwards Sapien XT transcatheter heart valve placed via open right transfemoral approach  . Syncope 08/21/2012  . UTI (lower urinary tract infection)     Past Surgical History:  Procedure Laterality Date  . CARDIAC CATHETERIZATION  04/2013  . EYE SURGERY     cataracts  . INTRAOPERATIVE TRANSESOPHAGEAL ECHOCARDIOGRAM N/A 02/02/2014   Procedure: INTRAOPERATIVE TRANSESOPHAGEAL ECHOCARDIOGRAM;  Surgeon: Tonny Bollman, MD;  Location: [facility] OR;  Service: Open Heart Surgery;  Laterality: N/A;  . LEFT AND RIGHT HEART CATHETERIZATION WITH CORONARY ANGIOGRAM N/A 05/25/2013   Procedure: LEFT AND RIGHT HEART CATHETERIZATION WITH CORONARY ANGIOGRAM;  Surgeon: [name] [name], MD;  Location: [facility] CATH LAB;  Service: Cardiovascular;  Laterality: N/A;  . TONSILLECTOMY    . TRANSCATHETER AORTIC VALVE REPLACEMENT, TRANSFEMORAL  02/02/2014   Edwards Sapien XT THV (size 23 mm, model # 9300TFX, serial # [id number])  . TRANSCATHETER AORTIC VALVE REPLACEMENT, TRANSFEMORAL N/A 02/02/2014   Procedure: TRANSCATHETER AORTIC  VALVE REPLACEMENT, TRANSFEMORAL;  Surgeon: Tonny BollmanMichael Cooper, MD;  [facility];  Service: Open Heart Surgery;  Laterality: N/A;     reports that she quit smoking about 56 years ago. Her smoking use included Cigarettes. She has a 12.00 pack-year smoking history. She has  never used smokeless tobacco. She reports that she does not drink alcohol or use drugs.  No Known Allergies  Family History  Problem Relation Age of Onset  . Heart attack Mother [id number]  . Heart Problems Father   . Heart attack Father [phone]  . Cerebral aneurysm Sister [phone]  . Sudden death Brother [phone]     Prior to Admission medications   Medication Sig Start Date End Date Taking? Authorizing Provider  Acetaminophen (MAPAP) 500 MG coapsule Take 500 mg by mouth every 4 (four) hours as needed for fever.    [provider]  alum & mag hydroxide-simeth (MINTOX) 200-200-20 MG/5ML suspension Take by mouth every 6 (six) hours as needed for indigestion or heartburn.    [provider]  amLODipine (NORVASC) 5 MG tablet Take 1 tablet (5 mg total) by mouth daily with breakfast. 08/14/16   SwazilandJordan, [name], MD  Cranberry 450 MG TABS Take 450 mg by mouth 2 (two) times daily.    [provider]  ELIQUIS 2.5 MG TABS tablet TAKE 1 TABLET (2.5 MG TOTAL) BY MOUTH 2 (TWO) TIMES DAILY. 11/08/16   SwazilandJordan, [name], MD  loperamide (LOPERAMIDE A-D) 2 MG tablet Take 2 mg by mouth 4 (four) times daily as needed for diarrhea or loose stools.    [provider]  memantine (NAMENDA) 10 MG tablet Take 1 tablet (10 mg total) by mouth 2 (two) times daily. 01/09/16   Penumalli, Glenford BayleyVikram R, MD  mirtazapine (REMERON) 30 MG tablet Take 30 mg by mouth at bedtime.    [provider]  NUTRITIONAL SUPPLEMENT LIQD Take 1 Bottle by mouth 3 (three) times daily with meals. *House Shake*    [provider]  sertraline (ZOLOFT) 100 MG tablet Take 100 mg by mouth daily with breakfast.  07/20/15   [provider]    Physical Exam: Vitals:   11/12/16 1830 11/12/16 1845 11/12/16 1848 11/12/16 1900  BP: (!) 146/93 (!) 156/86  (!) 149/76  Pulse: (!) 102 (!) 110  (!) 104  Resp: (!) 27 (!) 27  (!) 29  Temp:   (!) 102.7 F (39.3 C)   TempSrc:   Rectal   SpO2: 96% 98%  96%  Weight:        Height:          Constitutional: No acute distress. Appears uncomfortable. No pallor, no diaphoresis.  Eyes: PERTLA, lids and conjunctivae normal ENMT: Mucous membranes are moist. Posterior pharynx clear of any exudate or lesions.   Neck: normal, supple, no masses, no thyromegaly Respiratory: clear to auscultation bilaterally, no wheezing, no crackles. Normal respiratory effort.    Cardiovascular: Rate ~110 and regular. No significant JVD. Abdomen: No distension, no tenderness, no masses palpated. Bowel sounds normal.  Musculoskeletal: no clubbing / cyanosis. No joint deformity upper and lower extremities.    Skin: no significant rashes, lesions, ulcers. Warm, dry, well-perfused. Neurologic: No gross facial asymmetry. Moving all extremities spontaneously. Sensation to light touch intact, patellar DTR's normal.   Psychiatric: Alert, minimal verbal response. Calm.      Labs on Admission: I have personally reviewed following labs and imaging studies  CBC:  Recent Labs Lab 11/12/16 1631  WBC 14.3*  NEUTROABS 11.3*  HGB 12.7  HCT 38.9  MCV 84.7  PLT 134*   Basic Metabolic Panel:  Recent Labs Lab 11/12/16 1631  NA 137  K 3.0*  CL 102  CO2 26  GLUCOSE 147*  BUN 13  CREATININE 0.91  CALCIUM 8.3*   GFR: Estimated Creatinine Clearance: 33.8 mL/min (by C-G formula based on SCr of 0.91 mg/dL). Liver Function Tests:  Recent Labs Lab 11/12/16 1631  AST 26  ALT 15  ALKPHOS 100  BILITOT 0.9  PROT 6.6  ALBUMIN 3.2*   No results for input(s): LIPASE, AMYLASE in the last 168 hours. No results for input(s): AMMONIA in the last 168 hours. Coagulation Profile: No results for input(s): INR, PROTIME in the last 168 hours. Cardiac Enzymes: No results for input(s): CKTOTAL, CKMB, CKMBINDEX, TROPONINI in the last 168 hours. BNP (last 3 results) No results for input(s): PROBNP in the last 8760 hours. HbA1C: No results for input(s): HGBA1C in the last 72 hours. CBG: No  results for input(s): GLUCAP in the last 168 hours. Lipid Profile: No results for input(s): CHOL, HDL, LDLCALC, TRIG, CHOLHDL, LDLDIRECT in the last 72 hours. Thyroid Function Tests: No results for input(s): TSH, T4TOTAL, FREET4, T3FREE, THYROIDAB in the last 72 hours. Anemia Panel: No results for input(s): VITAMINB12, FOLATE, FERRITIN, TIBC, IRON, RETICCTPCT in the last 72 hours. Urine analysis:    Component Value Date/Time   COLORURINE YELLOW 09/29/2016 1208   APPEARANCEUR TURBID (A) 09/29/2016 1208   LABSPEC 1.019 09/29/2016 1208   PHURINE 5.0 09/29/2016 1208   GLUCOSEU 50 (A) 09/29/2016 1208   HGBUR SMALL (A) 09/29/2016 1208   BILIRUBINUR NEGATIVE 09/29/2016 1208   KETONESUR NEGATIVE 09/29/2016 1208   PROTEINUR 100 (A) 09/29/2016 1208   UROBILINOGEN 0.2 06/03/2014 0740   NITRITE NEGATIVE 09/29/2016 1208   LEUKOCYTESUR LARGE (A) 09/29/2016 1208   Sepsis Labs: @LABRCNTIP (procalcitonin:4,lacticidven:4) )No results found for this or any previous visit (from the past 240 hour(s)).   Radiological Exams on Admission: Dg Chest 2 View  Result Date: 11/12/2016 CLINICAL DATA:  Fever. EXAM: CHEST  2 VIEW COMPARISON:  09/29/2016 FINDINGS: The asymmetric elevation right hemidiaphragm. Left base opacity seen previously is resolved in the interval. There is new patchy airspace disease in the parahilar right lung on today's exam. The cardio pericardial silhouette is enlarged. Bones are diffusely demineralized. Telemetry leads overlie the chest. IMPRESSION: New patchy right parahilar airspace opacity. Pneumonia is a consideration. Electronically Signed   By: Kennith Center M.D.   On: 11/12/2016 18:23   Dg Pelvis 1-2 Views  Result Date: 11/12/2016 CLINICAL DATA:  Initial evaluation for acute fever, found down. EXAM: PELVIS - 1-2 VIEW COMPARISON:  Prior CT from 08/26/2016. FINDINGS: Diffuse osteopenia, limiting evaluation for possible subtle acute nondisplaced fractures. No acute fracture  dislocation identified. Bony pelvis intact. SI joints approximated. No pubic diastasis. No acute fracture about either hip. Femoral head heights preserved. No acute soft tissue abnormality. Calcified fibroids overlie the pelvis. Degenerative changes noted within the lower lumbar spine. IMPRESSION: 1. No acute osseous abnormality about the pelvis. 2. Osteopenia. 3. Calcified uterine fibroids. Electronically Signed   By: Rise Mu M.D.   On: 11/12/2016 18:24   Ct Head Wo Contrast  Result Date: 11/12/2016 CLINICAL DATA:  Small posterior head hematoma after falling today. Taking Eliquis. EXAM: CT HEAD WITHOUT CONTRAST CT CERVICAL SPINE WITHOUT CONTRAST TECHNIQUE: Multidetector CT imaging of the head and cervical spine was performed following the standard protocol without intravenous contrast. Multiplanar CT image  reconstructions of the cervical spine were also generated. COMPARISON:  07/26/2016. FINDINGS: CT HEAD FINDINGS Brain: Diffusely enlarged ventricles and subarachnoid spaces. Patchy white matter low density in both cerebral hemispheres. No intracranial hemorrhage, mass lesion or CT evidence of acute infarction. Vascular: No hyperdense vessel or unexpected calcification. Skull: Normal. Negative for fracture or focal lesion. Sinuses/Orbits: Right maxillary sinus mucosal thickening and right posterior ethmoid retention cyst. Unremarkable orbits. Other: Posterior scalp hematoma centered in the midline. CT CERVICAL SPINE FINDINGS Alignment: Straightening of the normal cervical lordosis is again demonstrated. There has been no significant change in mild anterolisthesis at the C3-4 level and mild retrolisthesis at the C4-5 level. Skull base and vertebrae: No acute fracture. No primary bone lesion or focal pathologic process. Soft tissues and spinal canal: No prevertebral fluid or swelling. No visible canal hematoma. Disc levels: Degenerative changes at the C4-5 and C5-6 level mild degenerative changes at  the C6-7 level with disc space narrowing and spur formation. There are also facet degenerative changes at multiple levels. The posterior elements are fused at the C2-3 and C4 levels. Upper chest: Clear lung apices. Other: Bilateral carotid artery calcifications. Bilateral maxillary sinus mucosal thickening, greater on the right. IMPRESSION: 1. Posterior scalp hematoma without skull fracture or intracranial hemorrhage. 2. No cervical spine fracture or subluxation. 3. Stable mild diffuse cerebral and cerebellar atrophy and mild chronic small vessel white matter ischemic changes in both cerebral hemispheres. 4. Stable cervical spine degenerative changes and straightening of the normal cervical lordosis. 5. Chronic bilateral maxillary sinusitis, greater on the right. 6. Bilateral carotid artery atheromatous calcifications. Electronically Signed   By: Beckie Salts M.D.   On: 11/12/2016 18:41   Ct Cervical Spine Wo Contrast  Result Date: 11/12/2016 CLINICAL DATA:  Small posterior head hematoma after falling today. Taking Eliquis. EXAM: CT HEAD WITHOUT CONTRAST CT CERVICAL SPINE WITHOUT CONTRAST TECHNIQUE: Multidetector CT imaging of the head and cervical spine was performed following the standard protocol without intravenous contrast. Multiplanar CT image reconstructions of the cervical spine were also generated. COMPARISON:  07/26/2016. FINDINGS: CT HEAD FINDINGS Brain: Diffusely enlarged ventricles and subarachnoid spaces. Patchy white matter low density in both cerebral hemispheres. No intracranial hemorrhage, mass lesion or CT evidence of acute infarction. Vascular: No hyperdense vessel or unexpected calcification. Skull: Normal. Negative for fracture or focal lesion. Sinuses/Orbits: Right maxillary sinus mucosal thickening and right posterior ethmoid retention cyst. Unremarkable orbits. Other: Posterior scalp hematoma centered in the midline. CT CERVICAL SPINE FINDINGS Alignment: Straightening of the normal  cervical lordosis is again demonstrated. There has been no significant change in mild anterolisthesis at the C3-4 level and mild retrolisthesis at the C4-5 level. Skull base and vertebrae: No acute fracture. No primary bone lesion or focal pathologic process. Soft tissues and spinal canal: No prevertebral fluid or swelling. No visible canal hematoma. Disc levels: Degenerative changes at the C4-5 and C5-6 level mild degenerative changes at the C6-7 level with disc space narrowing and spur formation. There are also facet degenerative changes at multiple levels. The posterior elements are fused at the C2-3 and C4 levels. Upper chest: Clear lung apices. Other: Bilateral carotid artery calcifications. Bilateral maxillary sinus mucosal thickening, greater on the right. IMPRESSION: 1. Posterior scalp hematoma without skull fracture or intracranial hemorrhage. 2. No cervical spine fracture or subluxation. 3. Stable mild diffuse cerebral and cerebellar atrophy and mild chronic small vessel white matter ischemic changes in both cerebral hemispheres. 4. Stable cervical spine degenerative changes and straightening of the normal cervical lordosis.  5. Chronic bilateral maxillary sinusitis, greater on the right. 6. Bilateral carotid artery atheromatous calcifications. Electronically Signed   By: Beckie Salts M.D.   On: 11/12/2016 18:41    EKG: Independently reviewed. Sinus tachycardia (rate 105), PAC's.   Assessment/Plan  1. Sepsis secondary to UTI, ?HCAP  - Pt presents from nursing home after being found down with fevers  - She is febrile here with leukocytosis, tachycardia, tachypnea, reassuring lactate  - CXR concerning for possible pneumonia, but there is no hypoxia or apparent respiratory distress - UA is suggestive of infection - Blood and urine cultures have been collected, she was treated with 2 liters NS and empiric vancomycin and Zosyn in ED - Historically, urine cultures have grown pan-sensitive E coli, and  pseudomonas has been isolated from her sputum - Plan to continue empiric abx with vancomycin and cefepime pending culture data    2. Hypokalemia  - Serum potassium is 3.0 on admission  - KCl added to IVF and 30 mEq oral potassium is ordered  - Monitor on telemetry and repeat chem panel in am    3. Paroxysmal atrial fibrillation - In a sinus rhythm on admission  - CHADS-VASc is at least 46 (age x2, gender, HTN)  - Continue Eliquis    4. Hypertension  - BP is at goal  - Continue Norvasc   5. Dementia  - Mental status is at baseline on admission per report of nursing home staff  - Continue Namenda   6. Thrombocytopenia  - Platelets 134k on admission  - Likely secondary to the acute infection, no bleeding evident  - Repeat CBC in am    DVT prophylaxis: Eliquis Code Status: DNR Family Communication: Discussed with patient Disposition Plan: Admit to telemetry Consults called: None Admission status: Inpatient    Briscoe Deutscher, MD [facility] Pager [phone]  If 7PM-7AM, please contact night-coverage [email or web address] Password [email or web address]  11/12/2016, 7:40 PM

## 2016-11-12 NOTE — ED Notes (Addendum)
EDP at bedside. Pt reports syncopal episode prior to fall. Per EMS, facility requesting pt be worked up for potential UTI. Pt denies urinary sx, N/V/D, or chills.

## 2016-11-12 NOTE — Progress Notes (Signed)
Pharmacy Antibiotic Note  Rebecca [name] is a 83 y.o. female admitted on 11/12/2016 with sepsis.  Pharmacy has been consulted for vancomycin and zosyn dosing. Tmax is 102 and WBC is elevated at 14.3. SCr is WNL and lactic acid is <2.   Plan: Vancomycin 1gm IV x 1 then 750mg  IV Q24H Zosyn 3.375gm IV Q8H (4 hr inf) F/u renal fxn, C&S, clinical status and trough at SS  Height: 5\' 2"  (157.5 cm) Weight: 132 lb (59.9 kg) IBW/kg (Calculated) : 50.1  Temp (24hrs), Avg:102 F (38.9 C), Min:102 F (38.9 C), Max:102 F (38.9 C)   Recent Labs Lab 11/12/16 1631 11/12/16 1655  WBC 14.3*  --   CREATININE 0.91  --   LATICACIDVEN  --  1.54    Estimated Creatinine Clearance: 33.8 mL/min (by C-G formula based on SCr of 0.91 mg/dL).    No Known Allergies  Antimicrobials this admission: Vanc 7/16> Zosyn 7/16>>  Dose adjustments this admission: N/A  Microbiology results: Pending  Thank you for allowing pharmacy to be a part of this patient's care.  Rebecca [name], Rebecca LeachRachel [name] 11/12/2016 5:50 PM

## 2016-11-12 NOTE — ED Notes (Addendum)
edp notified about antibiotics not being ordered

## 2016-11-12 NOTE — ED Provider Notes (Signed)
[facility]-EMER[facility]ENCY DEPT Provider Note   CSN: [id number] Arrival date & time: 11/12/16  1622     History   Chief Complaint Chief Complaint  Patient presents with  . Fall    HPI Rebecca [name] is a 83 y.o. female.  HPI   Pt with hx paroxysmal afib on eliquis, aortic valve replacement, syncope, Lewy Body Dementia brought in by EMS after being found down on the floor.  C/O pain in the back of her head.  Denies any other symptoms.    Level V caveat for dementia   Past Medical History:  Diagnosis Date  . Age-related macular degeneration, wet, right eye (HCC)   . Aortic stenosis   . Aortic stenosis   . Dementia    "slight"/daughter 12/02/2013  . Dementia with Lewy bodies   . Depression   . Heart block   . Heart murmur    "slight"  . High cholesterol   . Hypertension   . Panic attacks   . Paroxysmal atrial fibrillation (HCC)   . S/P TAVR (transcatheter aortic valve replacement) 02/02/2014   23 mm Edwards Sapien XT transcatheter heart valve placed via open right transfemoral approach  . Syncope 08/21/2012  . UTI (lower urinary tract infection)     Patient Active Problem List   Diagnosis Date Noted  . HCAP (healthcare-associated pneumonia) 11/12/2016  . Hypokalemia 11/12/2016  . Thrombocytopenia (HCC) 11/12/2016  . E coli bacteremia 06/13/2016  . Sepsis secondary to UTI (HCC) 06/11/2016  . Moderate episode of recurrent major depressive disorder (HCC)   . Sleep-wake disorder   . Somnolence   . Decreased appetite   . Goals of care, counseling/discussion   . Palliative care by specialist   . Advance care planning   . Pre-syncope 04/16/2016  . Confusion 04/15/2016  . MDD (major depressive disorder), single episode, moderate (HCC) 07/04/2015  . S/P TAVR (transcatheter aortic valve replacement) 02/02/2014  . Severe aortic stenosis 02/02/2014  . Sinus tachycardia 12/04/2013  . Sinus bradycardia 12/04/2013  . Encounter for therapeutic drug monitoring 06/17/2013  . Mild  dementia 11/06/2012  . PAF (paroxysmal atrial fibrillation) (HCC) 10/03/2012  . Long term (current) use of anticoagulants 10/03/2012  . Syncope 08/21/2012  . Aortic stenosis, severe 07/09/2012  . Near syncope 07/08/2012  . UTI (lower urinary tract infection) 07/08/2012  . BRONCHIECTASIS 12/09/2007  . SINUSITIS, CHRONIC 07/07/2007  . BRONCHITIS, CHRONIC 07/07/2007  . HYPERLIPIDEMIA 06/09/2007  . Essential hypertension 06/09/2007  . ALLERGIC RHINITIS 06/09/2007  . OSTEOPOROSIS 06/09/2007    Past Surgical History:  Procedure Laterality Date  . CARDIAC CATHETERIZATION  04/2013  . EYE SURGERY     cataracts  . INTRAOPERATIVE TRANSESOPHAGEAL ECHOCARDIOGRAM N/A 02/02/2014   Procedure: INTRAOPERATIVE TRANSESOPHAGEAL ECHOCARDIOGRAM;  Surgeon: Tonny Bollman, MD;  Location: [facility] OR;  Service: Open Heart Surgery;  Laterality: N/A;  . LEFT AND RIGHT HEART CATHETERIZATION WITH CORONARY ANGIOGRAM N/A 05/25/2013   Procedure: LEFT AND RIGHT HEART CATHETERIZATION WITH CORONARY ANGIOGRAM;  Surgeon: [name] [name], MD;  Location: [name] CATH LAB;  Service: Cardiovascular;  Laterality: N/A;  . TONSILLECTOMY    . TRANSCATHETER AORTIC VALVE REPLACEMENT, TRANSFEMORAL  02/02/2014   Edwards Sapien XT THV (size 23 mm, model # 9300TFX, serial # [id number])  . TRANSCATHETER AORTIC VALVE REPLACEMENT, TRANSFEMORAL N/A 02/02/2014   Procedure: TRANSCATHETER AORTIC VALVE REPLACEMENT, TRANSFEMORAL;  Surgeon: Tonny Bollman, MD;  Location: [facility] OR;  Service: Open Heart Surgery;  Laterality: N/A;    OB History    No data available  Home Medications    Prior to Admission medications   Medication Sig Start Date End Date Taking? Authorizing Provider  Acetaminophen (MAPAP) 500 MG coapsule Take 500 mg by mouth every 4 (four) hours as needed for fever.   Yes [provider]  alum & mag hydroxide-simeth (MINTOX) 200-200-20 MG/5ML suspension Take by mouth every 6 (six) hours as needed for indigestion or heartburn.    Yes [provider]  amLODipine (NORVASC) 5 MG tablet Take 1 tablet (5 mg total) by mouth daily with breakfast. 08/14/16  Yes SwazilandJordan, [name], MD  cefUROXime (CEFTIN) 500 MG tablet Take 500 mg by mouth 2 (two) times daily with a meal.   Yes [provider]  Cranberry 450 MG TABS Take 450 mg by mouth 2 (two) times daily.   Yes [provider]  ELIQUIS 2.5 MG TABS tablet TAKE 1 TABLET (2.5 MG TOTAL) BY MOUTH 2 (TWO) TIMES DAILY. 11/08/16  Yes SwazilandJordan, [name], MD  estradiol (ESTRACE) 0.1 MG/GM vaginal cream Place 1 Applicatorful vaginally once a week.   Yes [provider]  guaifenesin (ROBITUSSIN) 100 MG/5ML syrup Take 200 mg by mouth 3 (three) times daily as needed for cough.   Yes [provider]  loperamide (LOPERAMIDE A-D) 2 MG tablet Take 2 mg by mouth 4 (four) times daily as needed for diarrhea or loose stools.   Yes [provider]  magnesium hydroxide (MILK OF MAGNESIA) 400 MG/5ML suspension Take 30 mLs by mouth daily as needed for mild constipation.   Yes [provider]  memantine (NAMENDA) 10 MG tablet Take 1 tablet (10 mg total) by mouth 2 (two) times daily. 01/09/16  Yes Penumalli, Glenford BayleyVikram R, MD  mirtazapine (REMERON) 30 MG tablet Take 30 mg by mouth at bedtime.   Yes [provider]  neomycin-bacitracin-polymyxin (NEOSPORIN) ointment Apply 1 application topically every 12 (twelve) hours. Skin tears and Abrasions   Yes [provider]  NUTRITIONAL SUPPLEMENT LIQD Take 1 Bottle by mouth 3 (three) times daily with meals. *House Shake*   Yes [provider]  sertraline (ZOLOFT) 100 MG tablet Take 100 mg by mouth daily with breakfast.  07/20/15  Yes [provider]    Family History Family History  Problem Relation Age of Onset  . Heart attack Mother [phone]  . Heart Problems Father   . Heart attack Father [phone]  . Cerebral aneurysm Sister [phone]  . Sudden death Brother [phone]    Social History Social  History  Substance Use Topics  . Smoking status: Former Smoker    Packs/day: 0.40    Years: 30.00    Types: Cigarettes    Quit date: 04/30/1960  . Smokeless tobacco: Never Used  . Alcohol use No     Allergies   Patient has no known allergies.   Review of Systems Review of Systems  Unable to perform ROS: Dementia     Physical Exam Updated Vital Signs BP (!) 123/57   Pulse (!) 104   Temp 98.8 F (37.1 C) (Oral)   Resp 14   Ht 5\' 2"  (1.575 m)   Wt 59.9 kg (132 lb)   SpO2 94%   BMI 24.14 kg/m   Physical Exam  Constitutional: She appears well-developed and well-nourished. No distress.  HENT:  Head: Normocephalic.    Neck:  Towel wrapped around c-spine  Cardiovascular: Normal rate and regular rhythm.   Pulmonary/Chest: Effort normal and breath sounds normal. No respiratory distress. She has no wheezes. She has  no rales.  Abdominal: Soft. She exhibits no distension. There is no tenderness. There is no rebound and no guarding.  Neurological: She is alert.  Skin: She is not diaphoretic.  Nursing note and vitals reviewed.    ED Treatments / Results  Labs (all labs ordered are listed, but only abnormal results are displayed) Labs Reviewed  COMPREHENSIVE METABOLIC PANEL - Abnormal; Notable for the following:       Result Value   Potassium 3.0 (*)    Glucose, Bld 147 (*)    Calcium 8.3 (*)    Albumin 3.2 (*)    GFR calc non Af Amer 55 (*)    All other components within normal limits  CBC WITH DIFFERENTIAL/PLATELET - Abnormal; Notable for the following:    WBC 14.3 (*)    RDW 16.6 (*)    Platelets 134 (*)    Neutro Abs 11.3 (*)    Monocytes Absolute 1.3 (*)    All other components within normal limits  URINALYSIS, ROUTINE W REFLEX MICROSCOPIC - Abnormal; Notable for the following:    APPearance HAZY (*)    Hgb urine dipstick MODERATE (*)    Protein, ur 100 (*)    Nitrite POSITIVE (*)    Leukocytes, UA LARGE (*)    Bacteria, UA MANY (*)    All other  components within normal limits  CULTURE, BLOOD (ROUTINE X 2)  CULTURE, BLOOD (ROUTINE X 2)  CULTURE, EXPECTORATED SPUTUM-ASSESSMENT  GRAM STAIN  MRSA PCR SCREENING  HIV ANTIBODY (ROUTINE TESTING)  STREP PNEUMONIAE URINARY ANTIGEN  BASIC METABOLIC PANEL  CBC WITH DIFFERENTIAL/PLATELET  I-STAT CG4 LACTIC ACID, ED    EKG  EKG Interpretation None       Radiology Dg Chest 2 View  Result Date: 11/12/2016 CLINICAL DATA:  Fever. EXAM: CHEST  2 VIEW COMPARISON:  09/29/2016 FINDINGS: The asymmetric elevation right hemidiaphragm. Left base opacity seen previously is resolved in the interval. There is new patchy airspace disease in the parahilar right lung on today's exam. The cardio pericardial silhouette is enlarged. Bones are diffusely demineralized. Telemetry leads overlie the chest. IMPRESSION: New patchy right parahilar airspace opacity. Pneumonia is a consideration. Electronically Signed   By: Kennith Center M.D.   On: 11/12/2016 18:23   Dg Pelvis 1-2 Views  Result Date: 11/12/2016 CLINICAL DATA:  Initial evaluation for acute fever, found down. EXAM: PELVIS - 1-2 VIEW COMPARISON:  Prior CT from 08/26/2016. FINDINGS: Diffuse osteopenia, limiting evaluation for possible subtle acute nondisplaced fractures. No acute fracture dislocation identified. Bony pelvis intact. SI joints approximated. No pubic diastasis. No acute fracture about either hip. Femoral head heights preserved. No acute soft tissue abnormality. Calcified fibroids overlie the pelvis. Degenerative changes noted within the lower lumbar spine. IMPRESSION: 1. No acute osseous abnormality about the pelvis. 2. Osteopenia. 3. Calcified uterine fibroids. Electronically Signed   By: Rise Mu M.D.   On: 11/12/2016 18:24   Ct Head Wo Contrast  Result Date: 11/12/2016 CLINICAL DATA:  Small posterior head hematoma after falling today. Taking Eliquis. EXAM: CT HEAD WITHOUT CONTRAST CT CERVICAL SPINE WITHOUT CONTRAST TECHNIQUE:  Multidetector CT imaging of the head and cervical spine was performed following the standard protocol without intravenous contrast. Multiplanar CT image reconstructions of the cervical spine were also generated. COMPARISON:  07/26/2016. FINDINGS: CT HEAD FINDINGS Brain: Diffusely enlarged ventricles and subarachnoid spaces. Patchy white matter low density in both cerebral hemispheres. No intracranial hemorrhage, mass lesion or CT evidence of acute infarction. Vascular: No hyperdense vessel  or unexpected calcification. Skull: Normal. Negative for fracture or focal lesion. Sinuses/Orbits: Right maxillary sinus mucosal thickening and right posterior ethmoid retention cyst. Unremarkable orbits. Other: Posterior scalp hematoma centered in the midline. CT CERVICAL SPINE FINDINGS Alignment: Straightening of the normal cervical lordosis is again demonstrated. There has been no significant change in mild anterolisthesis at the C3-4 level and mild retrolisthesis at the C4-5 level. Skull base and vertebrae: No acute fracture. No primary bone lesion or focal pathologic process. Soft tissues and spinal canal: No prevertebral fluid or swelling. No visible canal hematoma. Disc levels: Degenerative changes at the C4-5 and C5-6 level mild degenerative changes at the C6-7 level with disc space narrowing and spur formation. There are also facet degenerative changes at multiple levels. The posterior elements are fused at the C2-3 and C4 levels. Upper chest: Clear lung apices. Other: Bilateral carotid artery calcifications. Bilateral maxillary sinus mucosal thickening, greater on the right. IMPRESSION: 1. Posterior scalp hematoma without skull fracture or intracranial hemorrhage. 2. No cervical spine fracture or subluxation. 3. Stable mild diffuse cerebral and cerebellar atrophy and mild chronic small vessel white matter ischemic changes in both cerebral hemispheres. 4. Stable cervical spine degenerative changes and straightening of the  normal cervical lordosis. 5. Chronic bilateral maxillary sinusitis, greater on the right. 6. Bilateral carotid artery atheromatous calcifications. Electronically Signed   By: Beckie Salts M.D.   On: 11/12/2016 18:41   Ct Cervical Spine Wo Contrast  Result Date: 11/12/2016 CLINICAL DATA:  Small posterior head hematoma after falling today. Taking Eliquis. EXAM: CT HEAD WITHOUT CONTRAST CT CERVICAL SPINE WITHOUT CONTRAST TECHNIQUE: Multidetector CT imaging of the head and cervical spine was performed following the standard protocol without intravenous contrast. Multiplanar CT image reconstructions of the cervical spine were also generated. COMPARISON:  07/26/2016. FINDINGS: CT HEAD FINDINGS Brain: Diffusely enlarged ventricles and subarachnoid spaces. Patchy white matter low density in both cerebral hemispheres. No intracranial hemorrhage, mass lesion or CT evidence of acute infarction. Vascular: No hyperdense vessel or unexpected calcification. Skull: Normal. Negative for fracture or focal lesion. Sinuses/Orbits: Right maxillary sinus mucosal thickening and right posterior ethmoid retention cyst. Unremarkable orbits. Other: Posterior scalp hematoma centered in the midline. CT CERVICAL SPINE FINDINGS Alignment: Straightening of the normal cervical lordosis is again demonstrated. There has been no significant change in mild anterolisthesis at the C3-4 level and mild retrolisthesis at the C4-5 level. Skull base and vertebrae: No acute fracture. No primary bone lesion or focal pathologic process. Soft tissues and spinal canal: No prevertebral fluid or swelling. No visible canal hematoma. Disc levels: Degenerative changes at the C4-5 and C5-6 level mild degenerative changes at the C6-7 level with disc space narrowing and spur formation. There are also facet degenerative changes at multiple levels. The posterior elements are fused at the C2-3 and C4 levels. Upper chest: Clear lung apices. Other: Bilateral carotid artery  calcifications. Bilateral maxillary sinus mucosal thickening, greater on the right. IMPRESSION: 1. Posterior scalp hematoma without skull fracture or intracranial hemorrhage. 2. No cervical spine fracture or subluxation. 3. Stable mild diffuse cerebral and cerebellar atrophy and mild chronic small vessel white matter ischemic changes in both cerebral hemispheres. 4. Stable cervical spine degenerative changes and straightening of the normal cervical lordosis. 5. Chronic bilateral maxillary sinusitis, greater on the right. 6. Bilateral carotid artery atheromatous calcifications. Electronically Signed   By: Beckie Salts M.D.   On: 11/12/2016 18:41    Procedures Procedures (including critical care time)  Medications Ordered in ED Medications  vancomycin (  VANCOCIN) IVPB 750 mg/150 ml premix (not administered)  apixaban (ELIQUIS) tablet 2.5 mg (not administered)  alum & mag hydroxide-simeth (MAALOX/MYLANTA) 200-200-20 MG/5ML suspension 15 mL (not administered)  amLODipine (NORVASC) tablet 5 mg (not administered)  mirtazapine (REMERON) tablet 30 mg (not administered)  memantine (NAMENDA) tablet 10 mg (not administered)  sertraline (ZOLOFT) tablet 100 mg (not administered)  0.9 % NaCl with KCl 40 mEq / L  infusion (not administered)  ceFEPIme (MAXIPIME) 1 g in dextrose 5 % 50 mL IVPB (not administered)  potassium chloride (K-DUR,KLOR-CON) CR tablet 30 mEq (not administered)  sodium chloride 0.9 % bolus 1,000 mL (0 mLs Intravenous Stopped 11/12/16 1847)    And  sodium chloride 0.9 % bolus 1,000 mL (0 mLs Intravenous Stopped 11/12/16 1847)  piperacillin-tazobactam (ZOSYN) IVPB 3.375 g (0 g Intravenous Stopped 11/12/16 1917)  vancomycin (VANCOCIN) IVPB 1000 mg/200 mL premix (1,000 mg Intravenous New Bag/Given 11/12/16 1940)  acetaminophen (TYLENOL) suppository 650 mg (650 mg Rectal Given 11/12/16 1900)     Initial Impression / Assessment and Plan / ED Course  I have reviewed the triage vital signs and  the nursing notes.  Pertinent labs & imaging results that were available during my care of the patient were reviewed by me and considered in my medical decision making (see chart for details).  Clinical Course as of Nov 13 2102  Mon Nov 12, 2016  1932 Discussed pt with Dr Antionette Char who accepts admission.    [EW]    Clinical Course User Index [EW] Chad, Claudia Greenley, [place]   Pt with hx of dementia and PAF on eliquis found on the floor.  Sepsis protocol ordered as pt found to be febrile and tachycardic.  Vanc/Zosyn, IVF given.   Found to have pneumonia.  CT head/c-spine negative.  UA pending at time of admission.  Admitted to [facility].    Final Clinical Impressions(s) / ED Diagnoses   Final diagnoses:  Sepsis, due to unspecified organism [facility])  Community acquired pneumonia of right lung, unspecified part of lung    New Prescriptions New Prescriptions   No medications on file     Trixie Dredge, Cordelia Poche 11/12/16 2104    Abelino Derrick, MD 11/12/16 [phone]

## 2016-11-12 NOTE — ED Notes (Signed)
Awaiting vanc from pharmacy, notified x 2

## 2016-11-12 NOTE — Progress Notes (Signed)
Received report from Phyllis Gingeravid RN from the ED

## 2016-11-13 ENCOUNTER — Inpatient Hospital Stay (HOSPITAL_COMMUNITY): Payer: Medicare Other

## 2016-11-13 DIAGNOSIS — R339 Retention of urine, unspecified: Secondary | ICD-10-CM

## 2016-11-13 DIAGNOSIS — A419 Sepsis, unspecified organism: Secondary | ICD-10-CM

## 2016-11-13 LAB — CBC WITH DIFFERENTIAL/PLATELET
BASOS PCT: 0 %
Basophils Absolute: 0 10*3/uL (ref 0.0–0.1)
Eosinophils Absolute: 0 10*3/uL (ref 0.0–0.7)
Eosinophils Relative: 0 %
HEMATOCRIT: 33.2 % — AB (ref 36.0–46.0)
HEMOGLOBIN: 10.6 g/dL — AB (ref 12.0–15.0)
Lymphocytes Relative: 9 %
Lymphs Abs: 1 10*3/uL (ref 0.7–4.0)
MCH: 27.2 pg (ref 26.0–34.0)
MCHC: 31.9 g/dL (ref 30.0–36.0)
MCV: 85.1 fL (ref 78.0–100.0)
MONO ABS: 1.2 10*3/uL — AB (ref 0.1–1.0)
Monocytes Relative: 11 %
NEUTROS ABS: 9.3 10*3/uL — AB (ref 1.7–7.7)
NEUTROS PCT: 80 %
Platelets: 108 10*3/uL — ABNORMAL LOW (ref 150–400)
RBC: 3.9 MIL/uL (ref 3.87–5.11)
RDW: 16.6 % — AB (ref 11.5–15.5)
WBC: 11.6 10*3/uL — ABNORMAL HIGH (ref 4.0–10.5)

## 2016-11-13 LAB — BASIC METABOLIC PANEL
ANION GAP: 8 (ref 5–15)
BUN: 17 mg/dL (ref 6–20)
CALCIUM: 7.4 mg/dL — AB (ref 8.9–10.3)
CO2: 23 mmol/L (ref 22–32)
Chloride: 107 mmol/L (ref 101–111)
Creatinine, Ser: 1.28 mg/dL — ABNORMAL HIGH (ref 0.44–1.00)
GFR calc non Af Amer: 36 mL/min — ABNORMAL LOW (ref >60)
GFR, EST AFRICAN AMERICAN: 42 mL/min — AB (ref >60)
Glucose, Bld: 126 mg/dL — ABNORMAL HIGH (ref 65–99)
Potassium: 3.2 mmol/L — ABNORMAL LOW (ref 3.5–5.1)
Sodium: 138 mmol/L (ref 135–145)

## 2016-11-13 LAB — MRSA PCR SCREENING: MRSA BY PCR: NEGATIVE

## 2016-11-13 LAB — GLUCOSE, CAPILLARY: GLUCOSE-CAPILLARY: 131 mg/dL — AB (ref 65–99)

## 2016-11-13 LAB — STREP PNEUMONIAE URINARY ANTIGEN: STREP PNEUMO URINARY ANTIGEN: NEGATIVE

## 2016-11-13 MED ORDER — POTASSIUM CHLORIDE CRYS ER 20 MEQ PO TBCR
40.0000 meq | EXTENDED_RELEASE_TABLET | Freq: Once | ORAL | Status: AC
  Administered 2016-11-13: 40 meq via ORAL
  Filled 2016-11-13: qty 2

## 2016-11-13 MED ORDER — ACETAMINOPHEN 650 MG RE SUPP
650.0000 mg | RECTAL | Status: DC | PRN
  Administered 2016-11-13 (×2): 650 mg via RECTAL
  Filled 2016-11-13 (×2): qty 1

## 2016-11-13 MED ORDER — ONDANSETRON HCL 4 MG/2ML IJ SOLN
4.0000 mg | Freq: Four times a day (QID) | INTRAMUSCULAR | Status: DC | PRN
  Administered 2016-11-13: 4 mg via INTRAVENOUS
  Filled 2016-11-13: qty 2

## 2016-11-13 MED ORDER — ACETAMINOPHEN 325 MG PO TABS: 650.0000 mg | ORAL_TABLET | ORAL | Status: DC | PRN

## 2016-11-13 MED ORDER — POTASSIUM CHLORIDE IN NACL 40-0.9 MEQ/L-% IV SOLN
INTRAVENOUS | Status: AC
Start: 2016-11-13 — End: 2016-11-13
  Filled 2016-11-13: qty 1000

## 2016-11-13 MED ORDER — SODIUM CHLORIDE 0.9 % IV BOLUS (SEPSIS)
1000.0000 mL | Freq: Once | INTRAVENOUS | Status: AC
  Administered 2016-11-13: 1000 mL via INTRAVENOUS

## 2016-11-13 MED ORDER — ACETAMINOPHEN 650 MG RE SUPP
325.0000 mg | Freq: Once | RECTAL | Status: AC
  Administered 2016-11-13: 325 mg via RECTAL
  Filled 2016-11-13: qty 1

## 2016-11-13 NOTE — Progress Notes (Signed)
PHARMACY - PHYSICIAN COMMUNICATION CRITICAL VALUE ALERT - BLOOD CULTURE IDENTIFICATION (BCID)  7/16 Blood culture: Gram + cocci in clusters, BCID Invalid    Name of physician (or Provider) Contacted: K Schorr ([facility])  Changes to prescribed antibiotics required: Cont current anti-biotics  Abran DukeLedford, Edythe Riches 11/13/2016  11:20 PM

## 2016-11-13 NOTE — Progress Notes (Signed)
NURSING PROGRESS NOTE  Rebecca CoxJennie CisekMRN: [id number] Admission Data: 11/12/16 at 2145 Attending Provider: Briscoe Deutscherpyd, Timothy S, MD PCP: Daisy Florooss, Charles Alan, MD Code status: DNR  Allergies: No Known Allergies  Past Medical History:  Past Medical History:  Diagnosis Date  . Age-related macular degeneration, wet, right eye (HCC)   . Aortic stenosis   . Aortic stenosis   . Dementia    "slight"/daughter 12/02/2013  . Dementia with Lewy bodies   . Depression   . Heart block   . Heart murmur    "slight"  . High cholesterol   . Hypertension   . Panic attacks   . Paroxysmal atrial fibrillation (HCC)   . S/P TAVR (transcatheter aortic valve replacement) 02/02/2014   23 mm Edwards Sapien XT transcatheter heart valve placed via open right transfemoral approach  . Syncope 08/21/2012  . UTI (lower urinary tract infection)    Past Surgical History:  Past Surgical History:  Procedure Laterality Date  . CARDIAC CATHETERIZATION  04/2013  . EYE SURGERY     cataracts  . INTRAOPERATIVE TRANSESOPHAGEAL ECHOCARDIOGRAM N/A 02/02/2014   Procedure: INTRAOPERATIVE TRANSESOPHAGEAL ECHOCARDIOGRAM;  Surgeon: Tonny BollmanMichael Cooper, MD;  [facility];  Service: Open Heart Surgery;  Laterality: N/A;  . LEFT AND RIGHT HEART CATHETERIZATION WITH CORONARY ANGIOGRAM N/A 05/25/2013   Procedure: LEFT AND RIGHT HEART CATHETERIZATION WITH CORONARY ANGIOGRAM;  Surgeon: [name] SwazilandJordan, MD;  [facility];  Service: Cardiovascular;  Laterality: N/A;  . TONSILLECTOMY    . TRANSCATHETER AORTIC VALVE REPLACEMENT, TRANSFEMORAL  02/02/2014   Edwards Sapien XT THV (size 23 mm, model # 9300TFX, serial # [id number])  . TRANSCATHETER AORTIC VALVE REPLACEMENT, TRANSFEMORAL N/A 02/02/2014   Procedure: TRANSCATHETER AORTIC VALVE REPLACEMENT, TRANSFEMORAL;  Surgeon: Tonny BollmanMichael Cooper, MD;  [facility];  Service: Open Heart Surgery;  Laterality: N/A;   Knox RoyaltyJennie [name] is a 83 y.o. female patient, arrived to floor in room 5W03 via  stretcher, transferred from ED. Patient alert and oriented X 2. No acute distress noted. Denies pain.   Vital signs: Oral temperature 98.7 F (37.1 C), Blood pressure 125/59, Pulse 81, RR 16, SpO2 100 % on room air. Height 5'2", weight 120 lbs (54.4 kg).   Cardiac monitoring: Telemetry box 5W # 06 in place. Second verified by Nelda MarseilleJenny Thacker.,RN  IV access: Left AC-infusing normal saline on arrival from ED; condition patent and no redness.  Skin: intact, no pressure ulcer noted in sacral area. Abrasion noted on right lateral arm and ecchymosis noted on right side of head. Second verified by Jerrye BushyJenny Thacker,RN   Patient's ID armband verified with patient and in place. Information packet given to patient. Fall risk assessed, SR up X2, patient able to verbalize understanding of risks associated with falls and to call nurse or staff to assist before getting out of bed. Patient oriented to room and equipment. Call bell within reach.

## 2016-11-13 NOTE — Progress Notes (Signed)
At 0242, patient's temperature went back up to 102.3 orally. HR in the 90's and patient was not shivering. Unable to give PRN tylenol due to time parameters. RNs removed covers from patient, placed ice packs on patient, and turned the air condition down in the room. Schorr,NP notified of situation. Schorr,NP placed order for 1000 mL bolus and a one time dose of 325mg  tylenol. Fluids and one time dose of tylenol given. Vital signs stable. Patient temperature began coming down. At 0534, patient temp was 98.4. Will continue to monitor and treat per MD orders.

## 2016-11-13 NOTE — Progress Notes (Signed)
PROGRESS NOTE    Rebecca [name]  ZOX:[id number] DOB: 1926-03-05 DOA: 11/12/2016 PCP: Daisy Floro, MD   Brief Narrative: Rebecca [name] is a 83 y.o. female with a history of dementia, depression, PAF on Eliquis, hypertension. She was found down at her SNF.   Assessment & Plan:   Principal Problem:   Sepsis secondary to UTI [facility]) Active Problems:   Essential hypertension   PAF (paroxysmal atrial fibrillation) (HCC)   Mild dementia   HCAP (healthcare-associated pneumonia)   Hypokalemia   Thrombocytopenia (HCC)   Sepsis (HCC)   Sepsis Secondary to HCAP vs UTI. Febrile overnight -blood/urine cultures pending -continue antibiotics  Hypokalemia Still low -continue potassium containing IV fluid  Paroxysmal atrial fibrillation Sinus rhythm. -continue Eliquis  Essential hypertension -continue amlodipine  Dementia -continue Namenda  Thrombocytopenia Present on admission. Possibly reactive. Down today. -trend CBC  Acute kidney injury New since admission. Unsure of etiology. Patient with a history of diastolic heart dysfunction and with multiple boluses yesterday. Possibly cardiorenal, however, patient overall looks euvolemic on exam and is more hypotensive. Also possibly secondary to hydronephrosis from urinary retention, -continue IV fluids as above -recheck metabolic panel -strict in/out (standard for the floor) -renal ultrasound  Acute urinary rentention Possibly secondary to acute infection. -foley -will perform voiding trial in 24-48 hours    DVT prophylaxis: Eliquis Code Status: DNR Family Communication: None at bedside Disposition Plan: Discharge in 48-72 hours   Consultants:   None  Procedures:   None  Antimicrobials:  Vancomycin (7/16>>  Cefepime (7/16>>    Subjective: Cough without production.  Objective: Vitals:   11/13/16 0534 11/13/16 0609 11/13/16 0623 11/13/16 0641  BP:  (!) 98/41 (!) 106/39   Pulse:  90 86   Resp:  16     Temp: 98.4 F (36.9 C) 100.1 F (37.8 C) 99.7 F (37.6 C) 99 F (37.2 C)  TempSrc:   Oral Oral  SpO2:  98%    Weight:      Height:        Intake/Output Summary (Last 24 hours) at 11/13/16 1209 Last data filed at 11/13/16 1115  Gross per 24 hour  Intake              100 ml  Output                0 ml  Net              100 ml   Filed Weights   11/12/16 1638 11/12/16 2153  Weight: 59.9 kg (132 lb) 54.4 kg (120 lb)    Examination:  General exam: Appears calm and comfortable Respiratory system: Clear to auscultation. Respiratory effort normal. Cardiovascular system: S1 & S2 heard, RRR. No murmurs, rubs, gallops or clicks. Gastrointestinal system: Abdomen is nondistended, soft and nontender. Normal bowel sounds heard. Central nervous system: Alert. No focal neurological deficits. Extremities: No edema. No calf tenderness Skin: No cyanosis. No rashes Psychiatry: Judgement and insight appear normal. Mood & affect appropriate.     Data Reviewed: I have personally reviewed following labs and imaging studies  CBC:  Recent Labs Lab 11/12/16 1631 11/13/16 0404  WBC 14.3* 11.6*  NEUTROABS 11.3* 9.3*  HGB 12.7 10.6*  HCT 38.9 33.2*  MCV 84.7 85.1  PLT 134* 108*   Basic Metabolic Panel:  Recent Labs Lab 11/12/16 1631 11/13/16 0404  NA 137 138  K 3.0* 3.2*  CL 102 107  CO2 26 23  GLUCOSE 147* 126*  BUN 13 17  CREATININE 0.91 1.28*  CALCIUM 8.3* 7.4*   GFR: Estimated Creatinine Clearance: 24 mL/min (A) (by C-G formula based on SCr of 1.28 mg/dL (H)). Liver Function Tests:  Recent Labs Lab 11/12/16 1631  AST 26  ALT 15  ALKPHOS 100  BILITOT 0.9  PROT 6.6  ALBUMIN 3.2*   No results for input(s): LIPASE, AMYLASE in the last 168 hours. No results for input(s): AMMONIA in the last 168 hours. Coagulation Profile: No results for input(s): INR, PROTIME in the last 168 hours. Cardiac Enzymes: No results for input(s): CKTOTAL, CKMB, CKMBINDEX, TROPONINI in  the last 168 hours. BNP (last 3 results) No results for input(s): PROBNP in the last 8760 hours. HbA1C: No results for input(s): HGBA1C in the last 72 hours. CBG:  Recent Labs Lab 11/13/16 0607  GLUCAP 131*   Lipid Profile: No results for input(s): CHOL, HDL, LDLCALC, TRIG, CHOLHDL, LDLDIRECT in the last 72 hours. Thyroid Function Tests: No results for input(s): TSH, T4TOTAL, FREET4, T3FREE, THYROIDAB in the last 72 hours. Anemia Panel: No results for input(s): VITAMINB12, FOLATE, FERRITIN, TIBC, IRON, RETICCTPCT in the last 72 hours. Sepsis Labs:  Recent Labs Lab 11/12/16 1655  LATICACIDVEN 1.54    Recent Results (from the past 240 hour(s))  MRSA PCR Screening     Status: None   Collection Time: 11/12/16 10:17 PM  Result Value Ref Range Status   MRSA by PCR NEGATIVE NEGATIVE Final    Comment:        The GeneXpert MRSA Assay (FDA approved for NASAL specimens only), is one component of a comprehensive MRSA colonization surveillance program. It is not intended to diagnose MRSA infection nor to guide or monitor treatment for MRSA infections.          Radiology Studies: Dg Chest 2 View  Result Date: 11/12/2016 CLINICAL DATA:  Fever. EXAM: CHEST  2 VIEW COMPARISON:  09/29/2016 FINDINGS: The asymmetric elevation right hemidiaphragm. Left base opacity seen previously is resolved in the interval. There is new patchy airspace disease in the parahilar right lung on today's exam. The cardio pericardial silhouette is enlarged. Bones are diffusely demineralized. Telemetry leads overlie the chest. IMPRESSION: New patchy right parahilar airspace opacity. Pneumonia is a consideration. Electronically Signed   By: Kennith Center M.D.   On: 11/12/2016 18:23   Dg Pelvis 1-2 Views  Result Date: 11/12/2016 CLINICAL DATA:  Initial evaluation for acute fever, found down. EXAM: PELVIS - 1-2 VIEW COMPARISON:  Prior CT from 08/26/2016. FINDINGS: Diffuse osteopenia, limiting evaluation for  possible subtle acute nondisplaced fractures. No acute fracture dislocation identified. Bony pelvis intact. SI joints approximated. No pubic diastasis. No acute fracture about either hip. Femoral head heights preserved. No acute soft tissue abnormality. Calcified fibroids overlie the pelvis. Degenerative changes noted within the lower lumbar spine. IMPRESSION: 1. No acute osseous abnormality about the pelvis. 2. Osteopenia. 3. Calcified uterine fibroids. Electronically Signed   By: Rise Mu M.D.   On: 11/12/2016 18:24   Ct Head Wo Contrast  Result Date: 11/12/2016 CLINICAL DATA:  Small posterior head hematoma after falling today. Taking Eliquis. EXAM: CT HEAD WITHOUT CONTRAST CT CERVICAL SPINE WITHOUT CONTRAST TECHNIQUE: Multidetector CT imaging of the head and cervical spine was performed following the standard protocol without intravenous contrast. Multiplanar CT image reconstructions of the cervical spine were also generated. COMPARISON:  07/26/2016. FINDINGS: CT HEAD FINDINGS Brain: Diffusely enlarged ventricles and subarachnoid spaces. Patchy white matter low density in both cerebral hemispheres. No intracranial hemorrhage, mass lesion or CT  evidence of acute infarction. Vascular: No hyperdense vessel or unexpected calcification. Skull: Normal. Negative for fracture or focal lesion. Sinuses/Orbits: Right maxillary sinus mucosal thickening and right posterior ethmoid retention cyst. Unremarkable orbits. Other: Posterior scalp hematoma centered in the midline. CT CERVICAL SPINE FINDINGS Alignment: Straightening of the normal cervical lordosis is again demonstrated. There has been no significant change in mild anterolisthesis at the C3-4 level and mild retrolisthesis at the C4-5 level. Skull base and vertebrae: No acute fracture. No primary bone lesion or focal pathologic process. Soft tissues and spinal canal: No prevertebral fluid or swelling. No visible canal hematoma. Disc levels: Degenerative  changes at the C4-5 and C5-6 level mild degenerative changes at the C6-7 level with disc space narrowing and spur formation. There are also facet degenerative changes at multiple levels. The posterior elements are fused at the C2-3 and C4 levels. Upper chest: Clear lung apices. Other: Bilateral carotid artery calcifications. Bilateral maxillary sinus mucosal thickening, greater on the right. IMPRESSION: 1. Posterior scalp hematoma without skull fracture or intracranial hemorrhage. 2. No cervical spine fracture or subluxation. 3. Stable mild diffuse cerebral and cerebellar atrophy and mild chronic small vessel white matter ischemic changes in both cerebral hemispheres. 4. Stable cervical spine degenerative changes and straightening of the normal cervical lordosis. 5. Chronic bilateral maxillary sinusitis, greater on the right. 6. Bilateral carotid artery atheromatous calcifications. Electronically Signed   By: Beckie Salts M.D.   On: 11/12/2016 18:41   Ct Cervical Spine Wo Contrast  Result Date: 11/12/2016 CLINICAL DATA:  Small posterior head hematoma after falling today. Taking Eliquis. EXAM: CT HEAD WITHOUT CONTRAST CT CERVICAL SPINE WITHOUT CONTRAST TECHNIQUE: Multidetector CT imaging of the head and cervical spine was performed following the standard protocol without intravenous contrast. Multiplanar CT image reconstructions of the cervical spine were also generated. COMPARISON:  07/26/2016. FINDINGS: CT HEAD FINDINGS Brain: Diffusely enlarged ventricles and subarachnoid spaces. Patchy white matter low density in both cerebral hemispheres. No intracranial hemorrhage, mass lesion or CT evidence of acute infarction. Vascular: No hyperdense vessel or unexpected calcification. Skull: Normal. Negative for fracture or focal lesion. Sinuses/Orbits: Right maxillary sinus mucosal thickening and right posterior ethmoid retention cyst. Unremarkable orbits. Other: Posterior scalp hematoma centered in the midline. CT  CERVICAL SPINE FINDINGS Alignment: Straightening of the normal cervical lordosis is again demonstrated. There has been no significant change in mild anterolisthesis at the C3-4 level and mild retrolisthesis at the C4-5 level. Skull base and vertebrae: No acute fracture. No primary bone lesion or focal pathologic process. Soft tissues and spinal canal: No prevertebral fluid or swelling. No visible canal hematoma. Disc levels: Degenerative changes at the C4-5 and C5-6 level mild degenerative changes at the C6-7 level with disc space narrowing and spur formation. There are also facet degenerative changes at multiple levels. The posterior elements are fused at the C2-3 and C4 levels. Upper chest: Clear lung apices. Other: Bilateral carotid artery calcifications. Bilateral maxillary sinus mucosal thickening, greater on the right. IMPRESSION: 1. Posterior scalp hematoma without skull fracture or intracranial hemorrhage. 2. No cervical spine fracture or subluxation. 3. Stable mild diffuse cerebral and cerebellar atrophy and mild chronic small vessel white matter ischemic changes in both cerebral hemispheres. 4. Stable cervical spine degenerative changes and straightening of the normal cervical lordosis. 5. Chronic bilateral maxillary sinusitis, greater on the right. 6. Bilateral carotid artery atheromatous calcifications. Electronically Signed   By: Beckie Salts M.D.   On: 11/12/2016 18:41        Scheduled Meds: .  amLODipine  5 mg Oral Q breakfast  . apixaban  2.5 mg Oral BID  . memantine  10 mg Oral BID  . mirtazapine  30 mg Oral QHS  . sertraline  100 mg Oral Q breakfast   Continuous Infusions: . ceFEPime (MAXIPIME) IV Stopped (11/12/16 2347)  . vancomycin       LOS: 1 day     Jacquelin Hawkingalph Glennda Weatherholtz, MD [facility] 11/13/2016, 12:09 PM Pager: [phone]  If 7PM-7AM, please contact night-coverage [email or web address] Password [email or web address] 11/13/2016, 12:09 PM

## 2016-11-13 NOTE — Progress Notes (Signed)
Patient began shivering stating, "I'm cold, I'm cold." Oral temp taken and it was 100.5 but rectal temp 102.9. Pt heart rate in the 130s and O2 saturations in the 80's. Patient began having a large runny BM and vomiting as well. Opyd,MD notified. MD placed order for PRN q4 hour rectal tylenol, PRN Norco, and q6hour zofran. Tylenol suppository given, but patient continued to have a bowel movement and suppository came back out. Opyd,MD notified and he gave orders to administer a second tylenol suppository since the first one came back out in stool. Zofran also administered for vomiting. 2 liters of oxygen applied to help increase patient's oxygen saturations Currently patient's oxygen saturation is 94% on 2 liters via nasal cannula. Patient resting comfortably in bed at this time. Will continue to monitor patient's temp.

## 2016-11-14 DIAGNOSIS — N179 Acute kidney failure, unspecified: Secondary | ICD-10-CM

## 2016-11-14 DIAGNOSIS — I10 Essential (primary) hypertension: Secondary | ICD-10-CM

## 2016-11-14 DIAGNOSIS — N390 Urinary tract infection, site not specified: Secondary | ICD-10-CM

## 2016-11-14 DIAGNOSIS — A419 Sepsis, unspecified organism: Secondary | ICD-10-CM

## 2016-11-14 LAB — CBC
HEMATOCRIT: 32.7 % — AB (ref 36.0–46.0)
HEMOGLOBIN: 10.4 g/dL — AB (ref 12.0–15.0)
MCH: 27.1 pg (ref 26.0–34.0)
MCHC: 31.8 g/dL (ref 30.0–36.0)
MCV: 85.2 fL (ref 78.0–100.0)
Platelets: 110 10*3/uL — ABNORMAL LOW (ref 150–400)
RBC: 3.84 MIL/uL — ABNORMAL LOW (ref 3.87–5.11)
RDW: 17.3 % — AB (ref 11.5–15.5)
WBC: 8.7 10*3/uL (ref 4.0–10.5)

## 2016-11-14 LAB — BASIC METABOLIC PANEL
ANION GAP: 7 (ref 5–15)
BUN: 19 mg/dL (ref 6–20)
CALCIUM: 7.7 mg/dL — AB (ref 8.9–10.3)
CO2: 19 mmol/L — AB (ref 22–32)
CREATININE: 1.06 mg/dL — AB (ref 0.44–1.00)
Chloride: 112 mmol/L — ABNORMAL HIGH (ref 101–111)
GFR calc Af Amer: 53 mL/min — ABNORMAL LOW (ref >60)
GFR calc non Af Amer: 45 mL/min — ABNORMAL LOW (ref >60)
Glucose, Bld: 90 mg/dL (ref 65–99)
Potassium: 3.9 mmol/L (ref 3.5–5.1)
SODIUM: 138 mmol/L (ref 135–145)

## 2016-11-14 LAB — HIV ANTIBODY (ROUTINE TESTING W REFLEX): HIV Screen 4th Generation wRfx: NONREACTIVE

## 2016-11-14 NOTE — Evaluation (Signed)
Physical Therapy Evaluation Patient Details Name: Rebecca [name] MRN: [id number] DOB: Dec 14, 1925 Today's Date: 11/14/2016   History of Present Illness  Pt adm from ALF memory care after fall and found to have UTI.  Clinical Impression  Pt admitted with above diagnosis and presents to PT with functional limitations due to deficits listed below (See PT problem list). Pt needs skilled PT to maximize independence and safety to allow discharge to back to ALF with HHPT and increased assist from staff with ambulation until pt returns to baseline. Expect pt's mobility and balance to improve as UTI improves.     Follow Up Recommendations Home health PT;Supervision for mobility/OOB (at memory care)    Equipment Recommendations  None recommended by PT    Recommendations for Other Services       Precautions / Restrictions Precautions Precautions: Fall Restrictions Weight Bearing Restrictions: No      Mobility  Bed Mobility Overal bed mobility: Needs Assistance Bed Mobility: Supine to Sit;Sit to Supine     Supine to sit: Min guard Sit to supine: Min guard   General bed mobility comments: Assist for safety  Transfers Overall transfer level: Needs assistance Equipment used: 1 person hand held assist Transfers: Sit to/from Stand Sit to Stand: Min assist         General transfer comment: HHA for balance  Ambulation/Gait Ambulation/Gait assistance: Min assist Ambulation Distance (Feet): 200 Feet Assistive device: 1 person hand held assist Gait Pattern/deviations: Step-through pattern;Decreased stride length;Drifts right/left Gait velocity: decr   General Gait Details: Pt with unsteady gait requiring min assist for balance  Stairs            Wheelchair Mobility    Modified Rankin (Stroke Patients Only)       Balance Overall balance assessment: Needs assistance Sitting-balance support: No upper extremity supported;Feet supported Sitting balance-Leahy Scale:  Good     Standing balance support: Single extremity supported Standing balance-Leahy Scale: Poor Standing balance comment: UE support and min guard for static standing                             Pertinent Vitals/Pain Pain Assessment: No/denies pain    Home Living Family/patient expects to be discharged to:: Assisted living               Home Equipment: None Additional Comments: Believe pt is from [facility]    Prior Function Level of Independence: Needs assistance   Gait / Transfers Assistance Needed: Per chart pt ambulatory at memory care.            Hand Dominance   Dominant Hand: Right    Extremity/Trunk Assessment   Upper Extremity Assessment Upper Extremity Assessment: Overall WFL for tasks assessed    Lower Extremity Assessment Lower Extremity Assessment: Generalized weakness       Communication   Communication: No difficulties  Cognition Arousal/Alertness: Awake/alert Behavior During Therapy: WFL for tasks assessed/performed Overall Cognitive Status: History of cognitive impairments - at baseline                                 General Comments: Per chart pt at baseline      General Comments      Exercises     Assessment/Plan    PT Assessment Patient needs continued PT services  PT Problem List Decreased strength;Decreased activity tolerance;Decreased balance;Decreased mobility;Decreased knowledge  of use of DME;Decreased safety awareness       PT Treatment Interventions Gait training;DME instruction;Functional mobility training;Therapeutic activities;Therapeutic exercise;Balance training;Patient/family education    PT Goals (Current goals can be found in the Care Plan section)  Acute Rehab PT Goals Patient Stated Goal: Not stated PT Goal Formulation: Patient unable to participate in goal setting Time For Goal Achievement: 11/21/16 Potential to Achieve Goals: Good    Frequency Min  3X/week   Barriers to discharge        Co-evaluation               AM-PAC PT "6 Clicks" Daily Activity  Outcome Measure Difficulty turning over in bed (including adjusting bedclothes, sheets and blankets)?: None Difficulty moving from lying on back to sitting on the side of the bed? : None Difficulty sitting down on and standing up from a chair with arms (e.g., wheelchair, bedside commode, etc,.)?: Total Help needed moving to and from a bed to chair (including a wheelchair)?: A Little Help needed walking in hospital room?: A Little Help needed climbing 3-5 steps with a railing? : A Little 6 Click Score: 18    End of Session Equipment Utilized During Treatment: Gait belt Activity Tolerance: Patient limited by fatigue Patient left: in bed;with call bell/phone within reach;with bed alarm set Nurse Communication: Mobility status PT Visit Diagnosis: Unsteadiness on feet (R26.81);Muscle weakness (generalized) (M62.81)    Time: [id number] PT Time Calculation (min) (ACUTE ONLY): 14 min   Charges:   PT Evaluation $PT Eval Low Complexity: 1 Procedure     PT G CodesMarland Kitchen:        [facility] Ab[facility] Le[facility] PT [phone]   Angelina OkCary W [facility] 11/14/2016, 1:05 PM

## 2016-11-14 NOTE — Progress Notes (Signed)
Patient ID: Rebecca [name], female   DOB: 06/23/1925, 83 y.o.   MRN: [id number]  PROGRESS NOTE    Rebecca [name]  WUJ:[id number] DOB: 1925/11/24 DOA: 11/12/2016  PCP: Daisy Floro, MD   Brief Narrative:  83 year old female with dementia, depression, PAF on Eliquis. She presented to ED with after she found found in SNF lying on the floor, fall not witnessed in SNF. She was found to have UTI and HCAP.   Assessment & Plan:   Principal Problem:   Sepsis secondary to UTI (HCC) / HCAP / Leukocytosis  - Pt found to have UTI on admission in addition to CXR finding of patchy right parahilar opacity concerning for pneumonia - PT on vanco and cefepime - One blood cx is negative and another grew GPC in clusters, final report pending - Urine cx grew gram negative rods, final report pending   Active Problems:   Fall - Not witnessed in SNF - No acute fractures on CT head and cervical spine - She does have posterior scalp hematoma which will continue to monitor - PT eval pending     Essential hypertension - Continue Norvasc    PAF (paroxysmal atrial fibrillation) (HCC) - CHADS vasc score at least 3 - On AC with apixaban    Mild dementia / Depression  - Continue Memantine and Zoloft    Hypokalemia - Due to sepsis - Supplemented and WNL    Thrombocytopenia (HCC) - Likely due to [facility] - Monitor daily CBC since pt on apixaban    Acute kidney injury - Due to sepsis - Cr stable    DVT prophylaxis: On apixaban Code Status: DNR/DNI Family Communication: no family at the bedside Disposition Plan: awaiting final cx results    Consultants:   PT  Procedures:   None   Antimicrobials:   Vanco and cefepime 7/16 -->    Subjective: No overnight events.  Objective: Vitals:   11/13/16 0641 11/13/16 1519 11/13/16 2153 11/14/16 0509  BP:  118/86 (!) 116/48 (!) 95/39  Pulse:   92 96  Resp:  16 17 17   Temp: 99 F (37.2 C) 100.1 F (37.8 C) 98.9 F (37.2 C) 98.5 F (36.9 C)    TempSrc: Oral Oral Oral   SpO2:  97% 97% 94%  Weight:      Height:        Intake/Output Summary (Last 24 hours) at 11/14/16 0919 Last data filed at 11/14/16 0434  Gross per 24 hour  Intake              440 ml  Output              100 ml  Net              340 ml   Filed Weights   11/12/16 1638 11/12/16 2153  Weight: 59.9 kg (132 lb) 54.4 kg (120 lb)    Examination:  General exam: Appears calm and comfortable  Respiratory system: diminished breath sounds, no wheezing  Cardiovascular system: S1 & S2 heard, Rate controlled  Gastrointestinal system: Abdomen is nondistended, soft and nontender. No organomegaly or masses felt. Normal bowel sounds heard. Central nervous system: No focal neurological deficits. Extremities: Symmetric 5 x 5 power. Skin: warm and dry Psychiatry: Normal mood and behavior   Data Reviewed: I have personally reviewed following labs and imaging studies  CBC:  Recent Labs Lab 11/12/16 1631 11/13/16 0404 11/14/16 0414  WBC 14.3* 11.6* 8.7  NEUTROABS 11.3* 9.3*  --  HGB 12.7 10.6* 10.4*  HCT 38.9 33.2* 32.7*  MCV 84.7 85.1 85.2  PLT 134* 108* 110*   Basic Metabolic Panel:  Recent Labs Lab 11/12/16 1631 11/13/16 0404 11/14/16 0414  NA 137 138 138  K 3.0* 3.2* 3.9  CL 102 107 112*  CO2 26 23 19*  GLUCOSE 147* 126* 90  BUN 13 17 19   CREATININE 0.91 1.28* 1.06*  CALCIUM 8.3* 7.4* 7.7*   GFR: Estimated Creatinine Clearance: 29 mL/min (A) (by C-G formula based on SCr of 1.06 mg/dL (H)). Liver Function Tests:  Recent Labs Lab 11/12/16 1631  AST 26  ALT 15  ALKPHOS 100  BILITOT 0.9  PROT 6.6  ALBUMIN 3.2*   No results for input(s): LIPASE, AMYLASE in the last 168 hours. No results for input(s): AMMONIA in the last 168 hours. Coagulation Profile: No results for input(s): INR, PROTIME in the last 168 hours. Cardiac Enzymes: No results for input(s): CKTOTAL, CKMB, CKMBINDEX, TROPONINI in the last 168 hours. BNP (last 3  results) No results for input(s): PROBNP in the last 8760 hours. HbA1C: No results for input(s): HGBA1C in the last 72 hours. CBG:  Recent Labs Lab 11/13/16 0607  GLUCAP 131*   Lipid Profile: No results for input(s): CHOL, HDL, LDLCALC, TRIG, CHOLHDL, LDLDIRECT in the last 72 hours. Thyroid Function Tests: No results for input(s): TSH, T4TOTAL, FREET4, T3FREE, THYROIDAB in the last 72 hours. Anemia Panel: No results for input(s): VITAMINB12, FOLATE, FERRITIN, TIBC, IRON, RETICCTPCT in the last 72 hours. Urine analysis:    Component Value Date/Time   COLORURINE YELLOW 11/12/2016 1845   APPEARANCEUR HAZY (A) 11/12/2016 1845   LABSPEC 1.006 11/12/2016 1845   PHURINE 6.0 11/12/2016 1845   GLUCOSEU NEGATIVE 11/12/2016 1845   HGBUR MODERATE (A) 11/12/2016 1845   BILIRUBINUR NEGATIVE 11/12/2016 1845   KETONESUR NEGATIVE 11/12/2016 1845   PROTEINUR 100 (A) 11/12/2016 1845   UROBILINOGEN 0.2 06/03/2014 0740   NITRITE POSITIVE (A) 11/12/2016 1845   LEUKOCYTESUR LARGE (A) 11/12/2016 1845   Sepsis Labs: @LABRCNTIP (procalcitonin:4,lacticidven:4)   ) Recent Results (from the past 240 hour(s))  Blood Culture (routine x 2)     Status: None (Preliminary result)   Collection Time: 11/12/16  5:30 PM  Result Value Ref Range Status   Specimen Description BLOOD BLOOD RIGHT HAND  Final   Special Requests Blood Culture adequate volume IN PEDIATRIC BOTTLE  Final   Culture  Setup Time   Final    GRAM POSITIVE COCCI IN CLUSTERS IN PEDIATRIC BOTTLE CRITICAL RESULT CALLED TO, READ BACK BY AND VERIFIED WITH: J LEDFORD PHARMD 2318 11/13/16 A BROWNING    Culture GRAM POSITIVE COCCI  Final   Report Status PENDING  Incomplete  Blood Culture (routine x 2)     Status: None (Preliminary result)   Collection Time: 11/12/16  5:30 PM  Result Value Ref Range Status   Specimen Description BLOOD LEFT ANTECUBITAL  Final   Special Requests Blood Culture adequate volume IN PEDIATRIC BOTTLE  Final   Culture  NO GROWTH < 24 HOURS  Final   Report Status PENDING  Incomplete  Culture, Urine     Status: Abnormal (Preliminary result)   Collection Time: 11/12/16  6:45 PM  Result Value Ref Range Status   Specimen Description URINE, RANDOM  Final   Special Requests NONE  Final   Culture >=100,000 COLONIES/mL GRAM NEGATIVE RODS (A)  Final   Report Status PENDING  Incomplete  MRSA PCR Screening     Status: None  Collection Time: 11/12/16 10:17 PM  Result Value Ref Range Status   MRSA by PCR NEGATIVE NEGATIVE Final      Radiology Studies: Ct Abdomen Pelvis Wo Contrast Result Date: 11/13/2016 1. No acute finding. 2. 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 3. Moderately distended urinary bladder. 4. Known 9 cm ovarian dermoid. 5. Cholelithiasis. 6. Sacral Paget's. Electronically Signed   By: Marnee Spring M.D.   On: 11/13/2016 20:34   Dg Chest 2 View Result Date: 11/12/2016 New patchy right parahilar airspace opacity. Pneumonia is a consideration.   Dg Pelvis 1-2 Views Result Date: 11/12/2016 1. No acute osseous abnormality about the pelvis. 2. Osteopenia. 3. Calcified uterine fibroids. Electronically Signed   By: Rise Mu M.D.   On: 11/12/2016 18:24   Ct Head Wo Contrast Result Date: 11/12/2016 1. Posterior scalp hematoma without skull fracture or intracranial hemorrhage. 2. No cervical spine fracture or subluxation. 3. Stable mild diffuse cerebral and cerebellar atrophy and mild chronic small vessel white matter ischemic changes in both cerebral hemispheres. 4. Stable cervical spine degenerative changes and straightening of the normal cervical lordosis. 5. Chronic bilateral maxillary sinusitis, greater on the right. 6. Bilateral carotid artery atheromatous calcifications.   Ct Cervical Spine Wo Contrast Result Date: 11/12/2016 1. Posterior scalp hematoma without skull fracture or  intracranial hemorrhage. 2. No cervical spine fracture or subluxation. 3. Stable mild diffuse cerebral and cerebellar atrophy and mild chronic small vessel white matter ischemic changes in both cerebral hemispheres. 4. Stable cervical spine degenerative changes and straightening of the normal cervical lordosis. 5. Chronic bilateral maxillary sinusitis, greater on the right. 6. Bilateral carotid artery atheromatous calcifications. Electronically Signed   By: Beckie Salts M.D.   On: 11/12/2016 18:41     Scheduled Meds: . amLODipine  5 mg Oral Q breakfast  . apixaban  2.5 mg Oral BID  . memantine  10 mg Oral BID  . mirtazapine  30 mg Oral QHS  . sertraline  100 mg Oral Q breakfast   Continuous Infusions: . ceFEPime (MAXIPIME) IV Stopped (11/13/16 2150)  . vancomycin Stopped (11/13/16 2027)     LOS: 2 days    Time spent: 25 minutes  Greater than 50% of the time spent on counseling and coordinating the care.   Manson Passey, MD [facility] Pager [phone]  If 7PM-7AM, please contact night-coverage [email or web address] Password [email or web address] 11/14/2016, 9:19 AM

## 2016-11-15 LAB — BASIC METABOLIC PANEL
ANION GAP: 11 (ref 5–15)
BUN: 11 mg/dL (ref 6–20)
CO2: 19 mmol/L — AB (ref 22–32)
Calcium: 8.3 mg/dL — ABNORMAL LOW (ref 8.9–10.3)
Chloride: 112 mmol/L — ABNORMAL HIGH (ref 101–111)
Creatinine, Ser: 0.88 mg/dL (ref 0.44–1.00)
GFR calc Af Amer: >60 mL/min (ref >60)
GFR calc non Af Amer: 57 mL/min — ABNORMAL LOW (ref >60)
GLUCOSE: 84 mg/dL (ref 65–99)
Potassium: 3.4 mmol/L — ABNORMAL LOW (ref 3.5–5.1)
Sodium: 142 mmol/L (ref 135–145)

## 2016-11-15 LAB — URINE CULTURE

## 2016-11-15 LAB — CULTURE, BLOOD (ROUTINE X 2): SPECIAL REQUESTS: ADEQUATE

## 2016-11-15 LAB — CBC
HEMATOCRIT: 33.7 % — AB (ref 36.0–46.0)
Hemoglobin: 10.8 g/dL — ABNORMAL LOW (ref 12.0–15.0)
MCH: 26.9 pg (ref 26.0–34.0)
MCHC: 32 g/dL (ref 30.0–36.0)
MCV: 83.8 fL (ref 78.0–100.0)
Platelets: 149 10*3/uL — ABNORMAL LOW (ref 150–400)
RBC: 4.02 MIL/uL (ref 3.87–5.11)
RDW: 16.7 % — ABNORMAL HIGH (ref 11.5–15.5)
WBC: 9.1 10*3/uL (ref 4.0–10.5)

## 2016-11-15 MED ORDER — CIPROFLOXACIN HCL 500 MG PO TABS: 500.0000 mg | ORAL_TABLET | Freq: Two times a day (BID) | ORAL | 0 refills | Status: AC

## 2016-11-15 MED ORDER — POTASSIUM CHLORIDE CRYS ER 20 MEQ PO TBCR
40.0000 meq | EXTENDED_RELEASE_TABLET | Freq: Once | ORAL | Status: AC
  Administered 2016-11-15: 40 meq via ORAL
  Filled 2016-11-15: qty 2

## 2016-11-15 NOTE — Care Management Note (Addendum)
Case Management Note  Patient Details  Name: Rebecca [name] MRN: [id number] Date of Birth: 1926/01/18  Subjective/Objective: Pt presented for Sepsis 2/2 UTI. Plan will be to return to ALF- Memory Care 11-15-16. CSW assisting with disposition needs.                   Action/Plan: Per CSW the AL[facility] will [facility]. Orders placed on FL2. No further needs from CM at this time.  Expected Discharge Date:  11/15/16               Expected Discharge Plan:  Assisted Living / Rest Home  In-House Referral:  Clinical Social Work  Discharge planning Services  CM Consult  Post Acute Care Choice:  (S) Home Health (ALF to get set up) Choice offered to:  NA  DME Arranged:  N/A DME Agency:  NA  HH Arranged:  RN, Disease Management, PT, OT, Nurse's Aide HH Agency:  NA (ALF to set up)  Status of Service:  Completed, signed off  If discussed at Long Length of Stay Meetings, dates discussed:    Additional Comments:  [name], [name], RN 11/15/2016, 10:45 AM

## 2016-11-15 NOTE — Consult Note (Signed)
   [facility]   11/15/2016  Rebecca RoyaltyJennie Rideaux 1926-04-20 [id number]   Patient assessed for multiple admission and ED visits in the Medicare ACO. Admitted with UTI sepsis Patient is an 83 year old [facility] and current disposition is to return there to their Memory Care unit. No further needs assessed as [facility].  For questions, please contact:  Charlesetta ShanksVictoria Lakashia Collison, RN BSN CCM [facility] Lia[facility]  [phone] business mobile phone Toll free office [phone]

## 2016-11-15 NOTE — Progress Notes (Signed)
[facility] Anticipated DC date: 11/15/16 Family notified: Daughter Transport by: [facility] 3pm   Per MD [facility]. RN, patient, patient's family, and facility notified of DC. Discharge Summary sent to facility. RN given number for report [id number]). DC packet on chart. Ambulance transport requested for patient.   CSW signing off.  Cristobal GoldmannNadia Adasyn Mcadams, [place] Clinical Social Worker [phone]

## 2016-11-15 NOTE — Care Management Important Message (Signed)
Important Message  Patient Details  Name: Rebecca [name] MRN: [id number] Date of Birth: [birth year]   Medicare Important Message Given:  Yes    Elliot CousinShavis, Angelos [place] Ellen, RN 11/15/2016, 11:53 AM

## 2016-11-15 NOTE — Progress Notes (Signed)
Patient discharge teaching given, including activity, diet, follow-up appoints, and medications. Patient verbalized understanding of all discharge instructions. IV access was d/c'd. Vitals are stable. Skin is intact except as charted in most recent assessments. [facility].

## 2016-11-15 NOTE — Discharge Instructions (Signed)

## 2016-11-15 NOTE — Discharge Summary (Signed)
Physician Discharge Summary  Rebecca [name] ZOX:[id number] DOB: 1926/03/14 DOA: 11/12/2016  PCP: Daisy Floro, MD  Admit date: 11/12/2016 Discharge date: 11/15/2016  Recommendations for Outpatient Follow-up:  Continue cipro for 7 days on discharge for E.Coli UTI. Hold estrogen supplementation. Continue Eliquis, monitor CBC per SNF protocol.  Potassium supplemented prior to discharge. Repeat BMP in 1 week to make sure potassium WNL. Platelets are stable, monitor per SNF protocol. Pt on Eliquis, no bleeding. She does have posterior scalp hematoma but blood work remains stable and please continue to monitor scalp hematoma, if worsening, hold Eliquis.    Discharge Diagnoses:   Principal Problem:   Sepsis secondary to UTI [facility]) Active Problems:   Essential hypertension   PAF (paroxysmal atrial fibrillation) (HCC)   Mild dementia   HCAP (healthcare-associated pneumonia)   Hypokalemia   Thrombocytopenia (HCC)   Sepsis (HCC)    Discharge Condition: stable; potassium supplemented prior to discharge   Diet recommendation: as tolerated   History of present illness:  83 year old female with dementia, depression, PAF on Eliquis. She presented to ED with after she found found in SNF lying on the floor, fall not witnessed in SNF. She was found to have UTI and HCAP.  Hospital Course:   Principal Problem:   Sepsis secondary to E.Coli UTI (HCC) / HCAP / Leukocytosis  - Pt found to have UTI on admission in addition to CXR finding of patchy right parahilar opacity concerning for pneumonia - Stopped vanco 7/18 - On cefepime now but will change to cipro for 7 days on discharge - Blood cx with no growth (one is growing staph species coag negative which is likely a contaminant) - Urine culture grew E.Coli sens to cipro   Active Problems:   Fall - Not witnessed in SNF - No acute fractures on CT head and cervical spine - She does have posterior scalp hematoma, stable     Essential  hypertension - Continue Norvasc     PAF (paroxysmal atrial fibrillation) (HCC) - CHADS vasc score at least 3 - Continue apixaban     Mild dementia / Depression  - Continue Memantine and Zoloft    Hypokalemia - Due to sepsis - Supplemented and WNL    Thrombocytopenia (HCC) - Likely due to [facility] - Monitor daily CBC since pt on apixaban    Acute kidney injury - Due to sepsis - Stable Cr, WNL   DVT prophylaxis: Continue Eliquis  Code Status: DNR/DNI Family Communication: family not at the bedside this am    Consultants:   PT  Procedures:   None   Antimicrobials:   Vanco 7/16 --> 7/18  Cefepime 7/16 --> 7/19    Signed:  Manson Passey, MD  [facility] 11/15/2016, 9:00 AM  Pager #: [phone]  Time spent in minutes: more than 30 minutes   Discharge Exam: Vitals:   11/14/16 2155 11/15/16 0531  BP: (!) 145/53 131/90  Pulse: (!) 102 98  Resp: 17 17  Temp: 98.6 F (37 C) 98.4 F (36.9 C)   Vitals:   11/14/16 0509 11/14/16 1444 11/14/16 2155 11/15/16 0531  BP: (!) 95/39 (!) 124/58 (!) 145/53 131/90  Pulse: 96 92 (!) 102 98  Resp: 17 17 17 17   Temp: 98.5 F (36.9 C) 98.6 F (37 C) 98.6 F (37 C) 98.4 F (36.9 C)  TempSrc:  Oral Oral Oral  SpO2: 94% 96% 97% 95%  Weight:      Height:  General: Pt is alert, follows commands appropriately, not in acute distress Cardiovascular: Rate controlled, S1/S2 + Respiratory: Clear to auscultation bilaterally, no wheezing, no crackles, no rhonchi Abdominal: Soft, non tender, non distended, bowel sounds +, no guarding Extremities:  no cyanosis, pulses palpable bilaterally DP and PT Neuro: Grossly nonfocal  Discharge Instructions  Discharge Instructions    Call MD for:  persistant nausea and vomiting    Complete by:  As directed    Call MD for:  redness, tenderness, or signs of infection (pain, swelling, redness, odor or green/yellow discharge around incision site)    Complete by:   As directed    Call MD for:  severe uncontrolled pain    Complete by:  As directed    Diet - low sodium heart healthy    Complete by:  As directed    Discharge instructions    Complete by:  As directed    Continue cipro for 7 days on discharge for E.Coli UTI. Hold estrogen supplementation. Continue Eliquis, monitor CBC per SNF protocol.   Increase activity slowly    Complete by:  As directed      Allergies as of 11/15/2016   No Known Allergies     Medication List    STOP taking these medications   cefUROXime 500 MG tablet Commonly known as:  CEFTIN   estradiol 0.1 MG/GM vaginal cream Commonly known as:  ESTRACE     TAKE these medications   amLODipine 5 MG tablet Commonly known as:  NORVASC Take 1 tablet (5 mg total) by mouth daily with breakfast.   ciprofloxacin 500 MG tablet Commonly known as:  CIPRO Take 1 tablet (500 mg total) by mouth 2 (two) times daily.   Cranberry 450 MG Tabs Take 450 mg by mouth 2 (two) times daily.   ELIQUIS 2.5 MG Tabs tablet Generic drug:  apixaban TAKE 1 TABLET (2.5 MG TOTAL) BY MOUTH 2 (TWO) TIMES DAILY.   guaifenesin 100 MG/5ML syrup Commonly known as:  ROBITUSSIN Take 200 mg by mouth 3 (three) times daily as needed for cough.   LOPERAMIDE A-D 2 MG tablet Generic drug:  loperamide Take 2 mg by mouth 4 (four) times daily as needed for diarrhea or loose stools.   magnesium hydroxide 400 MG/5ML suspension Commonly known as:  MILK OF MAGNESIA Take 30 mLs by mouth daily as needed for mild constipation.   MAPAP 500 MG coapsule Generic drug:  Acetaminophen Take 500 mg by mouth every 4 (four) hours as needed for fever.   memantine 10 MG tablet Commonly known as:  NAMENDA Take 1 tablet (10 mg total) by mouth 2 (two) times daily.   MINTOX 200-200-20 MG/5ML suspension Generic drug:  alum & mag hydroxide-simeth Take by mouth every 6 (six) hours as needed for indigestion or heartburn.   mirtazapine 30 MG tablet Commonly known as:   REMERON Take 30 mg by mouth at bedtime.   neomycin-bacitracin-polymyxin ointment Commonly known as:  NEOSPORIN Apply 1 application topically every 12 (twelve) hours. Skin tears and Abrasions   NUTRITIONAL SUPPLEMENT Liqd Take 1 Bottle by mouth 3 (three) times daily with meals. *House Shake*   sertraline 100 MG tablet Commonly known as:  ZOLOFT Take 100 mg by mouth daily with breakfast.      Follow-up Information    Daisy Floro, MD. Schedule an appointment as soon as possible for a visit in 1 week(s).   Specialty:  Family Medicine Contact information: [address] [place] [address] [phone]  The results of significant diagnostics from this hospitalization (including imaging, microbiology, ancillary and laboratory) are listed below for reference.    Significant Diagnostic Studies: Ct Abdomen Pelvis Wo Contrast  Result Date: 11/13/2016 CLINICAL DATA:  Followup acute kidney injury. EXAM: CT ABDOMEN AND PELVIS WITHOUT CONTRAST TECHNIQUE: Multidetector CT imaging of the abdomen and pelvis was performed following the standard protocol without IV contrast. COMPARISON:  08/26/2016 FINDINGS: Lower chest: Transcatheter aortic valve replacement. Atherosclerosis. Hepatobiliary: Numerous hepatic low densities that were cystic on previous enhanced imaging. A cyst exophytic from the left hepatic lobe has dystrophic type calcifications are stable from 2016. Cholelithiasis. No evidence of acute cholecystitis. Pancreas: Known 9 mm pancreatic head cyst, stable. No acute finding. Spleen: Unremarkable. Adrenals/Urinary Tract: Negative adrenals. 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 Chronic variable dilatation of the lower pole moiety, likely chronic UPJ obstruction based on previous enhanced MRI. Known cysts in the lower pole left kidney measuring 28 mm. Moderately distended urinary bladder without superimposed acute finding.  Stomach/Bowel:  No obstruction. No appendicitis. Vascular/Lymphatic: No acute vascular abnormality. No mass or adenopathy. Reproductive:There is a soft tissue, fatty, and calcified mass consistent with ovarian dermoid, ~9 cm. Other: No ascites or pneumoperitoneum. Musculoskeletal: Trabecular coarsening throughout the expanded sacrum. No acute finding. Lumbar disc degeneration with mild scoliosis. IMPRESSION: 1. No acute finding. 2. 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 3. Moderately distended urinary bladder. 4. Known 9 cm ovarian dermoid. 5. Cholelithiasis. 6. Sacral Paget's. Electronically Signed   By: Marnee Spring M.D.   On: 11/13/2016 20:34   Dg Chest 2 View  Result Date: 11/12/2016 CLINICAL DATA:  Fever. EXAM: CHEST  2 VIEW COMPARISON:  09/29/2016 FINDINGS: The asymmetric elevation right hemidiaphragm. Left base opacity seen previously is resolved in the interval. There is new patchy airspace disease in the parahilar right lung on today's exam. The cardio pericardial silhouette is enlarged. Bones are diffusely demineralized. Telemetry leads overlie the chest. IMPRESSION: New patchy right parahilar airspace opacity. Pneumonia is a consideration. Electronically Signed   By: Kennith Center M.D.   On: 11/12/2016 18:23   Dg Pelvis 1-2 Views  Result Date: 11/12/2016 CLINICAL DATA:  Initial evaluation for acute fever, found down. EXAM: PELVIS - 1-2 VIEW COMPARISON:  Prior CT from 08/26/2016. FINDINGS: Diffuse osteopenia, limiting evaluation for possible subtle acute nondisplaced fractures. No acute fracture dislocation identified. Bony pelvis intact. SI joints approximated. No pubic diastasis. No acute fracture about either hip. Femoral head heights preserved. No acute soft tissue abnormality. Calcified fibroids overlie the pelvis. Degenerative changes noted within the lower lumbar spine. IMPRESSION:  1. No acute osseous abnormality about the pelvis. 2. Osteopenia. 3. Calcified uterine fibroids. Electronically Signed   By: Rise Mu M.D.   On: 11/12/2016 18:24   Ct Head Wo Contrast  Result Date: 11/12/2016 CLINICAL DATA:  Small posterior head hematoma after falling today. Taking Eliquis. EXAM: CT HEAD WITHOUT CONTRAST CT CERVICAL SPINE WITHOUT CONTRAST TECHNIQUE: Multidetector CT imaging of the head and cervical spine was performed following the standard protocol without intravenous contrast. Multiplanar CT image reconstructions of the cervical spine were also generated. COMPARISON:  07/26/2016. FINDINGS: CT HEAD FINDINGS Brain: Diffusely enlarged ventricles and subarachnoid spaces. Patchy white matter low density in both cerebral hemispheres. No intracranial hemorrhage, mass lesion or CT evidence of acute infarction. Vascular: No hyperdense vessel or unexpected calcification. Skull: Normal. Negative for fracture or  focal lesion. Sinuses/Orbits: Right maxillary sinus mucosal thickening and right posterior ethmoid retention cyst. Unremarkable orbits. Other: Posterior scalp hematoma centered in the midline. CT CERVICAL SPINE FINDINGS Alignment: Straightening of the normal cervical lordosis is again demonstrated. There has been no significant change in mild anterolisthesis at the C3-4 level and mild retrolisthesis at the C4-5 level. Skull base and vertebrae: No acute fracture. No primary bone lesion or focal pathologic process. Soft tissues and spinal canal: No prevertebral fluid or swelling. No visible canal hematoma. Disc levels: Degenerative changes at the C4-5 and C5-6 level mild degenerative changes at the C6-7 level with disc space narrowing and spur formation. There are also facet degenerative changes at multiple levels. The posterior elements are fused at the C2-3 and C4 levels. Upper chest: Clear lung apices. Other: Bilateral carotid artery calcifications. Bilateral maxillary sinus mucosal  thickening, greater on the right. IMPRESSION: 1. Posterior scalp hematoma without skull fracture or intracranial hemorrhage. 2. No cervical spine fracture or subluxation. 3. Stable mild diffuse cerebral and cerebellar atrophy and mild chronic small vessel white matter ischemic changes in both cerebral hemispheres. 4. Stable cervical spine degenerative changes and straightening of the normal cervical lordosis. 5. Chronic bilateral maxillary sinusitis, greater on the right. 6. Bilateral carotid artery atheromatous calcifications. Electronically Signed   By: Beckie SaltsSteven  Reid M.D.   On: 11/12/2016 18:41   Ct Cervical Spine Wo Contrast  Result Date: 11/12/2016 CLINICAL DATA:  Small posterior head hematoma after falling today. Taking Eliquis. EXAM: CT HEAD WITHOUT CONTRAST CT CERVICAL SPINE WITHOUT CONTRAST TECHNIQUE: Multidetector CT imaging of the head and cervical spine was performed following the standard protocol without intravenous contrast. Multiplanar CT image reconstructions of the cervical spine were also generated. COMPARISON:  07/26/2016. FINDINGS: CT HEAD FINDINGS Brain: Diffusely enlarged ventricles and subarachnoid spaces. Patchy white matter low density in both cerebral hemispheres. No intracranial hemorrhage, mass lesion or CT evidence of acute infarction. Vascular: No hyperdense vessel or unexpected calcification. Skull: Normal. Negative for fracture or focal lesion. Sinuses/Orbits: Right maxillary sinus mucosal thickening and right posterior ethmoid retention cyst. Unremarkable orbits. Other: Posterior scalp hematoma centered in the midline. CT CERVICAL SPINE FINDINGS Alignment: Straightening of the normal cervical lordosis is again demonstrated. There has been no significant change in mild anterolisthesis at the C3-4 level and mild retrolisthesis at the C4-5 level. Skull base and vertebrae: No acute fracture. No primary bone lesion or focal pathologic process. Soft tissues and spinal canal: No  prevertebral fluid or swelling. No visible canal hematoma. Disc levels: Degenerative changes at the C4-5 and C5-6 level mild degenerative changes at the C6-7 level with disc space narrowing and spur formation. There are also facet degenerative changes at multiple levels. The posterior elements are fused at the C2-3 and C4 levels. Upper chest: Clear lung apices. Other: Bilateral carotid artery calcifications. Bilateral maxillary sinus mucosal thickening, greater on the right. IMPRESSION: 1. Posterior scalp hematoma without skull fracture or intracranial hemorrhage. 2. No cervical spine fracture or subluxation. 3. Stable mild diffuse cerebral and cerebellar atrophy and mild chronic small vessel white matter ischemic changes in both cerebral hemispheres. 4. Stable cervical spine degenerative changes and straightening of the normal cervical lordosis. 5. Chronic bilateral maxillary sinusitis, greater on the right. 6. Bilateral carotid artery atheromatous calcifications. Electronically Signed   By: Beckie SaltsSteven  Reid M.D.   On: 11/12/2016 18:41    Microbiology: Recent Results (from the past 240 hour(s))  Blood Culture (routine x 2)     Status: Abnormal   Collection  Time: 11/12/16  5:30 PM  Result Value Ref Range Status   Specimen Description BLOOD BLOOD RIGHT HAND  Final   Special Requests Blood Culture adequate volume IN PEDIATRIC BOTTLE  Final   Culture  Setup Time   Final    GRAM POSITIVE COCCI IN CLUSTERS IN PEDIATRIC BOTTLE CRITICAL RESULT CALLED TO, READ BACK BY AND VERIFIED WITH: J LEDFORD PHARMD [id number] 11/13/16 A BROWNING    Culture (A)  Final    STAPHYLOCOCCUS SPECIES (COAGULASE NEGATIVE) THE SIGNIFICANCE OF ISOLATING THIS ORGANISM FROM A SINGLE SET OF BLOOD CULTURES WHEN MULTIPLE SETS ARE DRAWN IS UNCERTAIN. PLEASE NOTIFY THE MICROBIOLOGY DEPARTMENT WITHIN ONE WEEK IF SPECIATION AND SENSITIVITIES ARE REQUIRED.    Report Status 11/15/2016 FINAL  Final  Blood Culture (routine x 2)     Status: None  (Preliminary result)   Collection Time: 11/12/16  5:30 PM  Result Value Ref Range Status   Specimen Description BLOOD LEFT ANTECUBITAL  Final   Special Requests Blood Culture adequate volume IN PEDIATRIC BOTTLE  Final   Culture NO GROWTH 2 DAYS  Final   Report Status PENDING  Incomplete  Culture, Urine     Status: Abnormal   Collection Time: 11/12/16  6:45 PM  Result Value Ref Range Status   Specimen Description URINE, RANDOM  Final   Special Requests NONE  Final   Culture >=100,000 COLONIES/mL ESCHERICHIA COLI (A)  Final   Report Status 11/15/2016 FINAL  Final   Organism ID, Bacteria ESCHERICHIA COLI (A)  Final      Susceptibility   Escherichia coli - MIC*    AMPICILLIN <=2 SENSITIVE Sensitive     CEFAZOLIN <=4 SENSITIVE Sensitive     CEFTRIAXONE <=1 SENSITIVE Sensitive     CIPROFLOXACIN <=0.25 SENSITIVE Sensitive     GENTAMICIN <=1 SENSITIVE Sensitive     IMIPENEM <=0.25 SENSITIVE Sensitive     NITROFURANTOIN <=16 SENSITIVE Sensitive     TRIMETH/SULFA <=20 SENSITIVE Sensitive     AMPICILLIN/SULBACTAM <=2 SENSITIVE Sensitive     PIP/TAZO <=4 SENSITIVE Sensitive     Extended ESBL NEGATIVE Sensitive     * >=100,000 COLONIES/mL ESCHERICHIA COLI  MRSA PCR Screening     Status: None   Collection Time: 11/12/16 10:17 PM  Result Value Ref Range Status   MRSA by PCR NEGATIVE NEGATIVE Final    Comment:        The GeneXpert MRSA Assay (FDA approved for NASAL specimens only), is one component of a comprehensive MRSA colonization surveillance program. It is not intended to diagnose MRSA infection nor to guide or monitor treatment for MRSA infections.      Labs: Basic Metabolic Panel:  Recent Labs Lab 11/12/16 1631 11/13/16 0404 11/14/16 0414 11/15/16 0241  NA 137 138 138 142  K 3.0* 3.2* 3.9 3.4*  CL 102 107 112* 112*  CO2 26 23 19* 19*  GLUCOSE 147* 126* 90 84  BUN 13 17 19 11   CREATININE 0.91 1.28* 1.06* 0.88  CALCIUM 8.3* 7.4* 7.7* 8.3*   Liver Function  Tests:  Recent Labs Lab 11/12/16 1631  AST 26  ALT 15  ALKPHOS 100  BILITOT 0.9  PROT 6.6  ALBUMIN 3.2*   No results for input(s): LIPASE, AMYLASE in the last 168 hours. No results for input(s): AMMONIA in the last 168 hours. CBC:  Recent Labs Lab 11/12/16 1631 11/13/16 0404 11/14/16 0414 11/15/16 0241  WBC 14.3* 11.6* 8.7 9.1  NEUTROABS 11.3* 9.3*  --   --  HGB 12.7 10.6* 10.4* 10.8*  HCT 38.9 33.2* 32.7* 33.7*  MCV 84.7 85.1 85.2 83.8  PLT 134* 108* 110* 149*   Cardiac Enzymes: No results for input(s): CKTOTAL, CKMB, CKMBINDEX, TROPONINI in the last 168 hours. BNP: BNP (last 3 results) No results for input(s): BNP in the last 8760 hours.  ProBNP (last 3 results) No results for input(s): PROBNP in the last 8760 hours.  CBG:  Recent Labs Lab 11/13/16 0607  GLUCAP 131*

## 2016-11-15 NOTE — Clinical Social Work Note (Signed)
Clinical Social Work Assessment  Patient Details  Name: Rebecca [name] MRN: [id number] Date of Birth: 1925-06-29  Date of referral:  11/15/16               Reason for consult:  Discharge Planning                Permission sought to share information with:  Facility Medical sales representativeContact Representative, Family Supports Permission granted to share information::  No  [facility]::     [facility]  [facility]  Relationship::  Daughter  Contact Information:  [phone]  Housing/Transportation Living arrangements for the past 2 months:  Assisted Living Facility Source of Information:  Adult Children, Facility Patient Interpreter Needed:  None Criminal Activity/Legal Involvement Pertinent to Current Situation/Hospitalization:  No - Comment as needed Significant Relationships:  Adult Children Lives with:  Facility Resident Do you feel safe going back to the place where you live?  Yes Need for family participation in patient care:  Yes (Comment)  Care giving concerns:  CSW received referral regarding discharge planning. Patient is disoriented. CSW spoke with patient's daughter, Rebecca [name]. [facility] and will return at discharge. CSW to continue to follow for needs.     Social Worker assessment / plan:  Patient to return to [facility] with home health PT.   Employment status:  Retired Health and safety inspectornsurance information:  Medicare PT Recommendations:  Home with Home Health Information / Referral to community resources:     Patient/Family's Response to care:  Patient's daughter reported agreement with discharge plan and would like for patient to discharge by [facility]AR. She is grateful that patient is doing better.   Patient/Family's Understanding of and Emotional Response to Diagnosis, Current Treatment, and Prognosis:  Patient/family is realistic regarding therapy needs and expressed being hopeful for return to Memory care. Patient's daughter expressed  understanding of CSW role and discharge process as well as her medical condition. No questions/concerns about plan or treatment.    Emotional Assessment Appearance:  Appears stated age Attitude/Demeanor/Rapport:  Unable to Assess Affect (typically observed):  Unable to Assess Orientation:  Oriented to Self, Oriented to Place Alcohol / Substance use:  Not Applicable Psych involvement (Current and /or in the community):  No (Comment)  Discharge Needs  Concerns to be addressed:  Care Coordination Readmission within the last 30 days:  No Current discharge risk:  None Barriers to Discharge:  No Barriers Identified   Mearl Latinadia S Dorthy Hustead, LCSWA 11/15/2016, 10:42 AM

## 2016-11-15 NOTE — [place] FL2 (Signed)
[place] MEDICAID FL2 LEVEL OF CARE SCREENING TOOL     IDENTIFICATION  Patient Name: Rebecca RoyaltyJennie [name] Birthdate: [birth year] Sex: female Admission Date (Current Location): 11/12/2016  [facility] [place]   Facility and Address:  [facility], [address], [place], [place] [address]      Provider Number: [id number]  Attending Physician Name and Address:  Alison Murrayevine, Alma M, MD  Relative Name and Phone Number:  Corrie DandyMary, daughter, [phone]    Current Level of Care: Hospital Recommended Level of Care: Memory Care Prior Approval Number:    Date Approved/Denied:   PASRR Number:    Discharge Plan: Other (Comment) (Memory Care)    Current Diagnoses: Patient Active Problem List   Diagnosis Date Noted  . Sepsis (HCC)   . HCAP (healthcare-associated pneumonia) 11/12/2016  . Hypokalemia 11/12/2016  . Thrombocytopenia (HCC) 11/12/2016  . E coli bacteremia 06/13/2016  . Sepsis secondary to UTI (HCC) 06/11/2016  . Moderate episode of recurrent major depressive disorder (HCC)   . Sleep-wake disorder   . Somnolence   . Decreased appetite   . Goals of care, counseling/discussion   . Palliative care by specialist   . Advance care planning   . Pre-syncope 04/16/2016  . Confusion 04/15/2016  . MDD (major depressive disorder), single episode, moderate (HCC) 07/04/2015  . S/P TAVR (transcatheter aortic valve replacement) 02/02/2014  . Severe aortic stenosis 02/02/2014  . Sinus tachycardia 12/04/2013  . Sinus bradycardia 12/04/2013  . Encounter for therapeutic drug monitoring 06/17/2013  . Mild dementia 11/06/2012  . PAF (paroxysmal atrial fibrillation) (HCC) 10/03/2012  . Long term (current) use of anticoagulants 10/03/2012  . Syncope 08/21/2012  . Aortic stenosis, severe 07/09/2012  . Near syncope 07/08/2012  . BRONCHIECTASIS 12/09/2007  . SINUSITIS, CHRONIC 07/07/2007  . BRONCHITIS, CHRONIC 07/07/2007  . HYPERLIPIDEMIA 06/09/2007  .  Essential hypertension 06/09/2007  . ALLERGIC RHINITIS 06/09/2007  . OSTEOPOROSIS 06/09/2007    Orientation RESPIRATION BLADDER Height & Weight     Self, Place  Normal Continent Weight: 54.4 kg (120 lb) Height:  5\' 2"  (157.5 cm)  BEHAVIORAL SYMPTOMS/MOOD NEUROLOGICAL BOWEL NUTRITION STATUS      Incontinent Diet (Regular)  AMBULATORY STATUS COMMUNICATION OF NEEDS Skin   Limited Assist Verbally Normal                       Personal Care Assistance Level of Assistance  Bathing, Feeding, Dressing Bathing Assistance: Limited assistance Feeding assistance: Limited assistance Dressing Assistance: Limited assistance     Functional Limitations Info             SPECIAL CARE FACTORS FREQUENCY  PT (By licensed PT)     PT Frequency: Home health PT 3x/week              Contractures      Additional Factors Info  Code Status, Allergies, Psychotropic Code Status Info: DNR Allergies Info: NKA Psychotropic Info: Zoloft         Current Medications (11/15/2016):   Discharge Medications: STOP taking these medications   cefUROXime 500 MG tablet Commonly known as:  CEFTIN   estradiol 0.1 MG/GM vaginal cream Commonly known as:  ESTRACE     TAKE these medications   amLODipine 5 MG tablet Commonly known as:  NORVASC Take 1 tablet (5 mg total) by mouth daily with breakfast.   ciprofloxacin 500 MG tablet Commonly known as:  CIPRO Take 1 tablet (500 mg total) by mouth 2 (  two) times daily.   Cranberry 450 MG Tabs Take 450 mg by mouth 2 (two) times daily.   ELIQUIS 2.5 MG Tabs tablet Generic drug:  apixaban TAKE 1 TABLET (2.5 MG TOTAL) BY MOUTH 2 (TWO) TIMES DAILY.   guaifenesin 100 MG/5ML syrup Commonly known as:  ROBITUSSIN Take 200 mg by mouth 3 (three) times daily as needed for cough.   LOPERAMIDE A-D 2 MG tablet Generic drug:  loperamide Take 2 mg by mouth 4 (four) times daily as needed for diarrhea or loose stools.   magnesium hydroxide 400  MG/5ML suspension Commonly known as:  MILK OF MAGNESIA Take 30 mLs by mouth daily as needed for mild constipation.   MAPAP 500 MG coapsule Generic drug:  Acetaminophen Take 500 mg by mouth every 4 (four) hours as needed for fever.   memantine 10 MG tablet Commonly known as:  NAMENDA Take 1 tablet (10 mg total) by mouth 2 (two) times daily.   MINTOX 200-200-20 MG/5ML suspension Generic drug:  alum & mag hydroxide-simeth Take by mouth every 6 (six) hours as needed for indigestion or heartburn.   mirtazapine 30 MG tablet Commonly known as:  REMERON Take 30 mg by mouth at bedtime.   neomycin-bacitracin-polymyxin ointment Commonly known as:  NEOSPORIN Apply 1 application topically every 12 (twelve) hours. Skin tears and Abrasions   NUTRITIONAL SUPPLEMENT Liqd Take 1 Bottle by mouth 3 (three) times daily with meals. *House Shake*   sertraline 100 MG tablet Commonly known as:  ZOLOFT Take 100 mg by mouth daily with breakfast.     Relevant Imaging Results:  Relevant Lab Results:   Additional Information SSN: [id number] [place], [place]

## 2016-11-17 LAB — CULTURE, BLOOD (ROUTINE X 2)
Culture: NO GROWTH
Special Requests: ADEQUATE

## 2016-11-19 DIAGNOSIS — G301 Alzheimer's disease with late onset: Secondary | ICD-10-CM | POA: Diagnosis not present

## 2016-11-19 DIAGNOSIS — Z7689 Persons encountering health services in other specified circumstances: Secondary | ICD-10-CM | POA: Diagnosis not present

## 2016-11-19 DIAGNOSIS — R296 Repeated falls: Secondary | ICD-10-CM | POA: Diagnosis not present

## 2016-11-19 DIAGNOSIS — Z87440 Personal history of urinary (tract) infections: Secondary | ICD-10-CM | POA: Diagnosis not present

## 2016-11-20 ENCOUNTER — Other Ambulatory Visit (HOSPITAL_COMMUNITY): Payer: Self-pay | Admitting: Psychiatry

## 2016-11-21 DIAGNOSIS — N3020 Other chronic cystitis without hematuria: Secondary | ICD-10-CM | POA: Diagnosis not present

## 2016-11-21 DIAGNOSIS — Z79899 Other long term (current) drug therapy: Secondary | ICD-10-CM | POA: Diagnosis not present

## 2016-11-21 DIAGNOSIS — D649 Anemia, unspecified: Secondary | ICD-10-CM | POA: Diagnosis not present

## 2016-11-21 DIAGNOSIS — N1330 Unspecified hydronephrosis: Secondary | ICD-10-CM | POA: Diagnosis not present

## 2016-11-26 DIAGNOSIS — Z87440 Personal history of urinary (tract) infections: Secondary | ICD-10-CM | POA: Diagnosis not present

## 2016-11-26 DIAGNOSIS — G301 Alzheimer's disease with late onset: Secondary | ICD-10-CM | POA: Diagnosis not present

## 2016-11-26 DIAGNOSIS — F413 Other mixed anxiety disorders: Secondary | ICD-10-CM | POA: Diagnosis not present

## 2016-12-05 DIAGNOSIS — N390 Urinary tract infection, site not specified: Secondary | ICD-10-CM | POA: Diagnosis not present

## 2016-12-05 DIAGNOSIS — D649 Anemia, unspecified: Secondary | ICD-10-CM | POA: Diagnosis not present

## 2016-12-05 DIAGNOSIS — G301 Alzheimer's disease with late onset: Secondary | ICD-10-CM | POA: Diagnosis not present

## 2016-12-05 DIAGNOSIS — Z79899 Other long term (current) drug therapy: Secondary | ICD-10-CM | POA: Diagnosis not present

## 2016-12-05 DIAGNOSIS — R319 Hematuria, unspecified: Secondary | ICD-10-CM | POA: Diagnosis not present

## 2016-12-05 DIAGNOSIS — E039 Hypothyroidism, unspecified: Secondary | ICD-10-CM | POA: Diagnosis not present

## 2016-12-05 DIAGNOSIS — I10 Essential (primary) hypertension: Secondary | ICD-10-CM | POA: Diagnosis not present

## 2016-12-12 DIAGNOSIS — M79674 Pain in right toe(s): Secondary | ICD-10-CM | POA: Diagnosis not present

## 2016-12-12 DIAGNOSIS — B351 Tinea unguium: Secondary | ICD-10-CM | POA: Diagnosis not present

## 2016-12-28 ENCOUNTER — Other Ambulatory Visit (HOSPITAL_COMMUNITY): Payer: Self-pay | Admitting: Psychiatry

## 2017-01-12 DIAGNOSIS — Z23 Encounter for immunization: Secondary | ICD-10-CM | POA: Diagnosis not present

## 2017-01-17 ENCOUNTER — Encounter (INDEPENDENT_AMBULATORY_CARE_PROVIDER_SITE_OTHER): Payer: Medicare Other | Admitting: Ophthalmology

## 2017-01-17 DIAGNOSIS — H353124 Nonexudative age-related macular degeneration, left eye, advanced atrophic with subfoveal involvement: Secondary | ICD-10-CM | POA: Diagnosis not present

## 2017-01-17 DIAGNOSIS — H353211 Exudative age-related macular degeneration, right eye, with active choroidal neovascularization: Secondary | ICD-10-CM | POA: Diagnosis not present

## 2017-01-17 DIAGNOSIS — I10 Essential (primary) hypertension: Secondary | ICD-10-CM

## 2017-01-17 DIAGNOSIS — H35033 Hypertensive retinopathy, bilateral: Secondary | ICD-10-CM

## 2017-01-17 DIAGNOSIS — H43813 Vitreous degeneration, bilateral: Secondary | ICD-10-CM | POA: Diagnosis not present

## 2017-01-18 DIAGNOSIS — N3020 Other chronic cystitis without hematuria: Secondary | ICD-10-CM | POA: Diagnosis not present

## 2017-01-18 DIAGNOSIS — N131 Hydronephrosis with ureteral stricture, not elsewhere classified: Secondary | ICD-10-CM | POA: Diagnosis not present

## 2017-01-24 ENCOUNTER — Other Ambulatory Visit: Payer: Self-pay | Admitting: Diagnostic Neuroimaging

## 2017-02-02 ENCOUNTER — Other Ambulatory Visit: Payer: Self-pay | Admitting: Cardiology

## 2017-03-13 DIAGNOSIS — R319 Hematuria, unspecified: Secondary | ICD-10-CM | POA: Diagnosis not present

## 2017-03-13 DIAGNOSIS — N390 Urinary tract infection, site not specified: Secondary | ICD-10-CM | POA: Diagnosis not present

## 2017-03-13 DIAGNOSIS — Z79899 Other long term (current) drug therapy: Secondary | ICD-10-CM | POA: Diagnosis not present

## 2017-03-18 DIAGNOSIS — G301 Alzheimer's disease with late onset: Secondary | ICD-10-CM | POA: Diagnosis not present

## 2017-03-18 DIAGNOSIS — F413 Other mixed anxiety disorders: Secondary | ICD-10-CM | POA: Diagnosis not present

## 2017-03-18 DIAGNOSIS — Z87440 Personal history of urinary (tract) infections: Secondary | ICD-10-CM | POA: Diagnosis not present

## 2017-03-28 DIAGNOSIS — M79675 Pain in left toe(s): Secondary | ICD-10-CM | POA: Diagnosis not present

## 2017-03-28 DIAGNOSIS — B351 Tinea unguium: Secondary | ICD-10-CM | POA: Diagnosis not present

## 2017-03-28 DIAGNOSIS — M79674 Pain in right toe(s): Secondary | ICD-10-CM | POA: Diagnosis not present

## 2017-04-15 ENCOUNTER — Other Ambulatory Visit: Payer: Self-pay | Admitting: Diagnostic Neuroimaging

## 2017-04-16 ENCOUNTER — Other Ambulatory Visit: Payer: Self-pay | Admitting: Diagnostic Neuroimaging

## 2017-05-07 DIAGNOSIS — R296 Repeated falls: Secondary | ICD-10-CM | POA: Diagnosis not present

## 2017-05-07 DIAGNOSIS — M6281 Muscle weakness (generalized): Secondary | ICD-10-CM | POA: Diagnosis not present

## 2017-05-07 DIAGNOSIS — G301 Alzheimer's disease with late onset: Secondary | ICD-10-CM | POA: Diagnosis not present

## 2017-05-08 DIAGNOSIS — R630 Anorexia: Secondary | ICD-10-CM | POA: Diagnosis not present

## 2017-05-08 DIAGNOSIS — F0280 Dementia in other diseases classified elsewhere without behavioral disturbance: Secondary | ICD-10-CM | POA: Diagnosis not present

## 2017-05-08 DIAGNOSIS — G3183 Dementia with Lewy bodies: Secondary | ICD-10-CM | POA: Diagnosis not present

## 2017-05-08 DIAGNOSIS — G301 Alzheimer's disease with late onset: Secondary | ICD-10-CM | POA: Diagnosis not present

## 2017-05-13 DIAGNOSIS — G3183 Dementia with Lewy bodies: Secondary | ICD-10-CM | POA: Diagnosis not present

## 2017-05-13 DIAGNOSIS — G301 Alzheimer's disease with late onset: Secondary | ICD-10-CM | POA: Diagnosis not present

## 2017-05-14 ENCOUNTER — Other Ambulatory Visit: Payer: Self-pay

## 2017-05-14 MED ORDER — AMLODIPINE BESYLATE 5 MG PO TABS: 5.0000 mg | ORAL_TABLET | Freq: Every day | ORAL | 1 refills | Status: AC

## 2017-05-14 NOTE — Telephone Encounter (Signed)
Rx(s) sent to pharmacy electronically.  

## 2017-05-15 DIAGNOSIS — G3183 Dementia with Lewy bodies: Secondary | ICD-10-CM | POA: Diagnosis not present

## 2017-05-15 DIAGNOSIS — G301 Alzheimer's disease with late onset: Secondary | ICD-10-CM | POA: Diagnosis not present

## 2017-05-16 DIAGNOSIS — G301 Alzheimer's disease with late onset: Secondary | ICD-10-CM | POA: Diagnosis not present

## 2017-05-16 DIAGNOSIS — G3183 Dementia with Lewy bodies: Secondary | ICD-10-CM | POA: Diagnosis not present

## 2017-05-20 DIAGNOSIS — G301 Alzheimer's disease with late onset: Secondary | ICD-10-CM | POA: Diagnosis not present

## 2017-05-20 DIAGNOSIS — R4182 Altered mental status, unspecified: Secondary | ICD-10-CM | POA: Diagnosis not present

## 2017-05-20 DIAGNOSIS — Z87440 Personal history of urinary (tract) infections: Secondary | ICD-10-CM | POA: Diagnosis not present

## 2017-05-20 DIAGNOSIS — G3183 Dementia with Lewy bodies: Secondary | ICD-10-CM | POA: Diagnosis not present

## 2017-05-20 NOTE — Progress Notes (Deleted)
Rebecca [name] Date of Birth: 07/09/1925 Medical Record #[id number]  History of Present Illness: Rebecca [name] is seen for followup s/p TAVR. She has paroxysmal atrial fibrillation and severe aortic stenosis. She is now on Eliquis. She is s/p TAVR on 02/02/14 from a transfemoral approach. She did very well from this procedure. Follow up Echo in November 2016  Showed EF 50-55%. Mean AV gradient of 22 mm Hg. Mild mitral stenosis. She is seen with her daughter today. Since her husband passed away she still has issues with major depression. Under the care of Dr. Donell Beers. She is more forgetful. She is going to Adult day care 5 days/wk and cannot be left alone.   She was admitted in December 2017 with a fall associated with prolonged confusion. Also episodes of unresponsiveness/ staring into space associated with eye fluttering. Possible seizure although EEG negative. Started on Depakote. Also felt to be dehydrated. Noted to have transient episode of complete heart block without symptoms in hospital. Seen by EP. Recommended stopping Aricept and avoiding rate slowing meds. Conservative therapy recommended and patient/family clearly did not want pacemaker.    She was admitted in February with syncope in setting of sepsis with E. Coli bacteremia and UTI. No arrhythmia noted.  Seen in ED on March 29 with fall after tripping and hit her head with contusion. CT negative for IC bleed.  Seen in ED April 28 with abdominal pain and acute cystitis. Treated with antibiotics as outpatient.   Seen in ED on June 2 with generalized weakness and again treated for UTI.   She was admitted in July 2018 after being found on the floor of SNF. Treated for E. Coli UTI and possible PNA.  On follow up today she states she is feeling very well. No chest pain, dyspnea, dizziness, syncope, palpitations. Her only complaint is that she feels sleepy. Walking with a walker. No fever, chills, dysuria.   Current Outpatient Medications on  File Prior to Visit  Medication Sig Dispense Refill  . Acetaminophen (MAPAP) 500 MG coapsule Take 500 mg by mouth every 4 (four) hours as needed for fever.    [name] alum & mag hydroxide-simeth (MINTOX) 200-200-20 MG/5ML suspension Take by mouth every 6 (six) hours as needed for indigestion or heartburn.    [name] amLODipine (NORVASC) 5 MG tablet Take 1 tablet (5 mg total) by mouth daily with breakfast. 90 tablet 1  . Cranberry 450 MG TABS Take 450 mg by mouth 2 (two) times daily.    [name] ELIQUIS 2.5 MG TABS tablet TAKE 1 TABLET (2.5 MG TOTAL) BY MOUTH 2 (TWO) TIMES DAILY. 60 tablet 5  . ELIQUIS 2.5 MG TABS tablet TAKE 1 TABLET BY MOUTH TWICE A DAY 180 tablet 1  . guaifenesin (ROBITUSSIN) 100 MG/5ML syrup Take 200 mg by mouth 3 (three) times daily as needed for cough.    . loperamide (LOPERAMIDE A-D) 2 MG tablet Take 2 mg by mouth 4 (four) times daily as needed for diarrhea or loose stools.    . magnesium hydroxide (MILK OF MAGNESIA) 400 MG/5ML suspension Take 30 mLs by mouth daily as needed for mild constipation.    . memantine (NAMENDA) 10 MG tablet TAKE 1 TABLET (10 MG TOTAL) BY MOUTH 2 (TWO) TIMES DAILY. 180 tablet 0  . mirtazapine (REMERON) 30 MG tablet Take 30 mg by mouth at bedtime.    [name] neomycin-bacitracin-polymyxin (NEOSPORIN) ointment Apply 1 application topically every 12 (twelve) hours. Skin tears and Abrasions    . NUTRITIONAL SUPPLEMENT  LIQD Take 1 Bottle by mouth 3 (three) times daily with meals. *House Shake*    . sertraline (ZOLOFT) 100 MG tablet Take 100 mg by mouth daily with breakfast.   5   No current facility-administered medications on file prior to visit.     No Known Allergies  Past Medical History:  Diagnosis Date  . Age-related macular degeneration, wet, right eye (HCC)   . Aortic stenosis   . Aortic stenosis   . Dementia    "slight"/daughter 12/02/2013  . Dementia with Lewy bodies   . Depression   . Heart block   . Heart murmur    "slight"  . High cholesterol   .  Hypertension   . Panic attacks   . Paroxysmal atrial fibrillation (HCC)   . S/P TAVR (transcatheter aortic valve replacement) 02/02/2014   23 mm Edwards Sapien XT transcatheter heart valve placed via open right transfemoral approach  . Syncope 08/21/2012  . UTI (lower urinary tract infection)     Past Surgical History:  Procedure Laterality Date  . CARDIAC CATHETERIZATION  04/2013  . EYE SURGERY     cataracts  . INTRAOPERATIVE TRANSESOPHAGEAL ECHOCARDIOGRAM N/A 02/02/2014   Procedure: INTRAOPERATIVE TRANSESOPHAGEAL ECHOCARDIOGRAM;  Surgeon: Tonny Bollman, MD;  Location[facility] OR;  Service: Open Heart Surgery;  Laterality: N/A;  . LEFT AND RIGHT HEART CATHETERIZATION WITH CORONARY ANGIOGRAM N/A 05/25/2013   Procedure: LEFT AND RIGHT HEART CATHETERIZATION WITH CORONARY ANGIOGRAM;  Surgeon: Hope Holst M [name], MD;  Location: [facility] CATH LAB;  Service: Cardiovascular;  Laterality: N/A;  . TONSILLECTOMY    . TRANSCATHETER AORTIC VALVE REPLACEMENT, TRANSFEMORAL  02/02/2014   Edwards Sapien XT THV (size 23 mm, model # 9300TFX, serial # [id number])  . TRANSCATHETER AORTIC VALVE REPLACEMENT, TRANSFEMORAL N/A 02/02/2014   Procedure: TRANSCATHETER AORTIC VALVE REPLACEMENT, TRANSFEMORAL;  Surgeon: Tonny Bollman, MD;  Location: [facility] OR;  Service: Open Heart Surgery;  Laterality: N/A;    Social History   Tobacco Use  Smoking Status Former Smoker  . Packs/day: 0.40  . Years: 30.00  . Pack years: 12.00  . Types: Cigarettes  . Last attempt to quit: 04/30/1960  . Years since quitting: 57.0  Smokeless Tobacco Never Used    Social History   Substance and Sexual Activity  Alcohol Use No    Family History  Problem Relation Age of Onset  . Heart attack Mother 25  . Heart Problems Father   . Heart attack Father 67  . Cerebral aneurysm Sister 41  . Sudden death Brother 68    Review of Systems: The review of systems is positive for chronic memory loss. She is on Aricept.  All other systems were reviewed and  are negative.  Physical Exam: There were no vitals taken for this visit. She is a pleasant elderly white female in no acute distress. HEENT: Normal. There is good dental care. Neck is supple without JVD, adenopathy, or thyromegaly. Carotid upstrokes are normal. Lungs: Clear Cardiovascular: Regular rate and rhythm. Normal S1 with diminished S2. There is a soft grade 1/6 systolic murmur at the right upper sternal border. Abdomen: Soft and nontender. No masses or bruits. Bowel sounds positive. Extremities: Normal radial, femoral, and pedal pulses. No cyanosis or edema.  Skin: Dry Neuro: Alert and oriented x3. Cranial nerves II through XII are intact. Mood is good  LABORATORY DATA:  Lab Results  Component Value Date   WBC 9.1 11/15/2016   HGB 10.8 (L) 11/15/2016   HCT 33.7 (L) 11/15/2016   PLT  149 (L) 11/15/2016   GLUCOSE 84 11/15/2016   ALT 15 11/12/2016   AST 26 11/12/2016   NA 142 11/15/2016   K 3.4 (L) 11/15/2016   CL 112 (H) 11/15/2016   CREATININE 0.88 11/15/2016   BUN 11 11/15/2016   CO2 19 (L) 11/15/2016   TSH 1.400 12/02/2013   INR 1.2 10/19/2014   HGBA1C 6.0 (H) 01/29/2014   Labs dated 11/30/15: cholesterol 165, triglycerides 146, HDL 54, LDL 81. CMET normal.  Assessment / Plan: 1.  Aortic stenosis: Severe. Now s/p TAVR with excellent result.   2. Major depression. Currently doing well.  3.  Paroxysmal atrial fibrillation.  on Eliquis. No significant sustained high rates.  4. Hyperlipidemia.  5. Hypertension-controlled  6. Chronic diastolic CHF well compensated.   7. Recurrent UTI.  8. Transient CHB in setting of sepsis and on Aricept. No symptomatic recurrence.   Continue current therapy  I will follow up in 6 months.

## 2017-05-21 DIAGNOSIS — G301 Alzheimer's disease with late onset: Secondary | ICD-10-CM | POA: Diagnosis not present

## 2017-05-21 DIAGNOSIS — G3183 Dementia with Lewy bodies: Secondary | ICD-10-CM | POA: Diagnosis not present

## 2017-05-22 DIAGNOSIS — Z79899 Other long term (current) drug therapy: Secondary | ICD-10-CM | POA: Diagnosis not present

## 2017-05-22 DIAGNOSIS — R319 Hematuria, unspecified: Secondary | ICD-10-CM | POA: Diagnosis not present

## 2017-05-22 DIAGNOSIS — R410 Disorientation, unspecified: Secondary | ICD-10-CM | POA: Diagnosis not present

## 2017-05-22 DIAGNOSIS — N390 Urinary tract infection, site not specified: Secondary | ICD-10-CM | POA: Diagnosis not present

## 2017-05-23 ENCOUNTER — Ambulatory Visit: Payer: Self-pay | Admitting: Cardiology

## 2017-05-23 DIAGNOSIS — G301 Alzheimer's disease with late onset: Secondary | ICD-10-CM | POA: Diagnosis not present

## 2017-05-23 DIAGNOSIS — G3183 Dementia with Lewy bodies: Secondary | ICD-10-CM | POA: Diagnosis not present

## 2017-05-27 DIAGNOSIS — G3183 Dementia with Lewy bodies: Secondary | ICD-10-CM | POA: Diagnosis not present

## 2017-05-27 DIAGNOSIS — G301 Alzheimer's disease with late onset: Secondary | ICD-10-CM | POA: Diagnosis not present

## 2017-06-05 DIAGNOSIS — G301 Alzheimer's disease with late onset: Secondary | ICD-10-CM | POA: Diagnosis not present

## 2017-06-05 DIAGNOSIS — G3183 Dementia with Lewy bodies: Secondary | ICD-10-CM | POA: Diagnosis not present

## 2017-06-10 DIAGNOSIS — Z87440 Personal history of urinary (tract) infections: Secondary | ICD-10-CM | POA: Diagnosis not present

## 2017-06-10 DIAGNOSIS — G3183 Dementia with Lewy bodies: Secondary | ICD-10-CM | POA: Diagnosis not present

## 2017-06-10 DIAGNOSIS — R296 Repeated falls: Secondary | ICD-10-CM | POA: Diagnosis not present

## 2017-06-10 DIAGNOSIS — G301 Alzheimer's disease with late onset: Secondary | ICD-10-CM | POA: Diagnosis not present

## 2017-06-12 DIAGNOSIS — G301 Alzheimer's disease with late onset: Secondary | ICD-10-CM | POA: Diagnosis not present

## 2017-06-12 DIAGNOSIS — G3183 Dementia with Lewy bodies: Secondary | ICD-10-CM | POA: Diagnosis not present

## 2017-06-13 DIAGNOSIS — G301 Alzheimer's disease with late onset: Secondary | ICD-10-CM | POA: Diagnosis not present

## 2017-06-13 DIAGNOSIS — G3183 Dementia with Lewy bodies: Secondary | ICD-10-CM | POA: Diagnosis not present

## 2017-06-17 DIAGNOSIS — G301 Alzheimer's disease with late onset: Secondary | ICD-10-CM | POA: Diagnosis not present

## 2017-06-17 DIAGNOSIS — R296 Repeated falls: Secondary | ICD-10-CM | POA: Diagnosis not present

## 2017-06-18 DIAGNOSIS — G301 Alzheimer's disease with late onset: Secondary | ICD-10-CM | POA: Diagnosis not present

## 2017-06-18 DIAGNOSIS — G3183 Dementia with Lewy bodies: Secondary | ICD-10-CM | POA: Diagnosis not present

## 2017-06-20 ENCOUNTER — Encounter: Payer: Self-pay | Admitting: Cardiology

## 2017-06-20 DIAGNOSIS — G301 Alzheimer's disease with late onset: Secondary | ICD-10-CM | POA: Diagnosis not present

## 2017-06-20 DIAGNOSIS — G3183 Dementia with Lewy bodies: Secondary | ICD-10-CM | POA: Diagnosis not present

## 2017-06-22 NOTE — Progress Notes (Signed)
Rebecca [name] Date of Birth: [birth year] Medical Record #[id number]  History of Present Illness: Rebecca [name] is seen for followup s/p TAVR. She has paroxysmal atrial fibrillation and severe aortic stenosis. She is now on Eliquis. She is s/p TAVR on 02/02/14 from a transfemoral approach. She did very well from this procedure. Follow up Echo in November 2016  Showed EF 50-55%. Mean AV gradient of 22 mm Hg. Mild mitral stenosis. She is seen with her daughter today. Since her husband passed away she still has issues with major depression. Under the care of Dr. Donell Beers. She is more forgetful. She is going to Adult day care 5 days/wk and cannot be left alone.   She was admitted in December 2017 with a fall associated with prolonged confusion. Also episodes of unresponsiveness/ staring into space associated with eye fluttering. Possible seizure although EEG negative. Started on Depakote. Also felt to be dehydrated. Noted to have transient episode of complete heart block without symptoms in hospital. Seen by EP. Recommended stopping Aricept and avoiding rate slowing meds. Conservative therapy recommended and patient/family clearly did not want pacemaker.     Last year from February to July had repeated hospital visits due to recurrent UTIs.   On follow up today she states she is feeling very well. She denies any chest pain, dyspnea, dizziness, syncope, palpitations. She has not been back to the hospital since July.  Her main complaint is of back pain.  Walking with a walker. No fever, chills, dysuria.   Current Outpatient Medications on File Prior to Visit  Medication Sig Dispense Refill  . Acetaminophen (MAPAP) 500 MG coapsule Take 500 mg by mouth every 4 (four) hours as needed for fever.    [name] alum & mag hydroxide-simeth (MINTOX) 200-200-20 MG/5ML suspension Take by mouth every 6 (six) hours as needed for indigestion or heartburn.    [name] amLODipine (NORVASC) 5 MG tablet Take 1 tablet (5 mg total) by mouth  daily with breakfast. 90 tablet 1  . Cranberry 450 MG TABS Take 450 mg by mouth 2 (two) times daily.    [name] ELIQUIS 2.5 MG TABS tablet TAKE 1 TABLET (2.5 MG TOTAL) BY MOUTH 2 (TWO) TIMES DAILY. 60 tablet 5  . estradiol (ESTRACE) 0.1 MG/GM vaginal cream Place 1 Applicatorful vaginally at bedtime.    [name] guaifenesin (ROBITUSSIN) 100 MG/5ML syrup Take 200 mg by mouth 3 (three) times daily as needed for cough.    . loperamide (LOPERAMIDE A-D) 2 MG tablet Take 2 mg by mouth 4 (four) times daily as needed for diarrhea or loose stools.    . magnesium hydroxide (MILK OF MAGNESIA) 400 MG/5ML suspension Take 30 mLs by mouth daily as needed for mild constipation.    . memantine (NAMENDA) 10 MG tablet TAKE 1 TABLET (10 MG TOTAL) BY MOUTH 2 (TWO) TIMES DAILY. 180 tablet 0  . mirtazapine (REMERON) 30 MG tablet Take 30 mg by mouth at bedtime.    [name] neomycin-bacitracin-polymyxin (NEOSPORIN) ointment Apply 1 application topically every 12 (twelve) hours. Skin tears and Abrasions    . NUTRITIONAL SUPPLEMENT LIQD Take 1 Bottle by mouth 3 (three) times daily with meals. *House Shake*    . sertraline (ZOLOFT) 100 MG tablet Take 100 mg by mouth daily with breakfast.   5  . trimethoprim (TRIMPEX) 100 MG tablet      No current facility-administered medications on file prior to visit.     No Known Allergies  Past Medical History:  Diagnosis Date  . Age-related  macular degeneration, wet, right eye (HCC)   . Aortic stenosis   . Aortic stenosis   . Dementia    "slight"/daughter 12/02/2013  . Dementia with Lewy bodies   . Depression   . Heart block   . Heart murmur    "slight"  . High cholesterol   . Hypertension   . Panic attacks   . Paroxysmal atrial fibrillation (HCC)   . S/P TAVR (transcatheter aortic valve replacement) 02/02/2014   23 mm Edwards Sapien XT transcatheter heart valve placed via open right transfemoral approach  . Syncope 08/21/2012  . UTI (lower urinary tract infection)     Past Surgical  History:  Procedure Laterality Date  . CARDIAC CATHETERIZATION  04/2013  . EYE SURGERY     cataracts  . INTRAOPERATIVE TRANSESOPHAGEAL ECHOCARDIOGRAM N/A 02/02/2014   Procedure: INTRAOPERATIVE TRANSESOPHAGEAL ECHOCARDIOGRAM;  Surgeon: Tonny BollmanMichael Cooper, MD;  [facility];  Service: Open Heart Surgery;  Laterality: N/A;  . LEFT AND RIGHT HEART CATHETERIZATION WITH CORONARY ANGIOGRAM N/A 05/25/2013   Procedure: LEFT AND RIGHT HEART CATHETERIZATION WITH CORONARY ANGIOGRAM;  Surgeon: Lether Tesch M SwazilandJordan, MD;  [facility];  Service: Cardiovascular;  Laterality: N/A;  . TONSILLECTOMY    . TRANSCATHETER AORTIC VALVE REPLACEMENT, TRANSFEMORAL  02/02/2014   Edwards Sapien XT THV (size 23 mm, model # 9300TFX, serial # [id number])  . TRANSCATHETER AORTIC VALVE REPLACEMENT, TRANSFEMORAL N/A 02/02/2014   Procedure: TRANSCATHETER AORTIC VALVE REPLACEMENT, TRANSFEMORAL;  Surgeon: Tonny BollmanMichael Cooper, MD;  [facility];  Service: Open Heart Surgery;  Laterality: N/A;    Social History   Tobacco Use  Smoking Status Former Smoker  . Packs/day: 0.40  . Years: 30.00  . Pack years: 12.00  . Types: Cigarettes  . Last attempt to quit: 04/30/1960  . Years since quitting: 6957.1  Smokeless Tobacco Never Used    Social History   Substance and Sexual Activity  Alcohol Use No    Family History  Problem Relation Age of Onset  . Heart attack Mother 7933  . Heart Problems Father   . Heart attack Father 1383  . Cerebral aneurysm Sister 6142  . Sudden death Brother 7765    Review of Systems: The review of systems is as noted in HPI.  All other systems were reviewed and are negative.  Physical Exam: BP 128/78 (BP Location: Right Arm, Patient Position: Sitting, Cuff Size: Normal)   Pulse 96   Ht 5\' 2"  (1.575 m)   Wt 137 lb (62.1 kg)   BMI 25.06 kg/m  GENERAL:  Well appearing, elderly WF in NAD HEENT:  PERRL, EOMI, sclera are clear. Oropharynx is clear. NECK:  No jugular venous distention, carotid upstroke brisk  and symmetric, no bruits, no thyromegaly or adenopathy LUNGS:  Clear to auscultation bilaterally CHEST:  Unremarkable HEART:  RRR,  PMI not displaced or sustained,S1 and S2 within normal limits, no S3, no S4: no clicks, no rubs, no murmurs ABD:  Soft, nontender. BS +, no masses or bruits. No hepatomegaly, no splenomegaly EXT:  2 + pulses throughout, no edema, no cyanosis no clubbing SKIN:  Warm and dry.  No rashes NEURO:  Alert and oriented x 3. Cranial nerves II through XII intact. PSYCH:  Cognitively intact  LABORATORY DATA:  Lab Results  Component Value Date   WBC 9.1 11/15/2016   HGB 10.8 (L) 11/15/2016   HCT 33.7 (L) 11/15/2016   PLT 149 (L) 11/15/2016   GLUCOSE 84 11/15/2016   ALT 15 11/12/2016   AST  26 11/12/2016   NA 142 11/15/2016   K 3.4 (L) 11/15/2016   CL 112 (H) 11/15/2016   CREATININE 0.88 11/15/2016   BUN 11 11/15/2016   CO2 19 (L) 11/15/2016   TSH 1.400 12/02/2013   INR 1.2 10/19/2014   HGBA1C 6.0 (H) 01/29/2014   Labs dated 11/30/15: cholesterol 165, triglycerides 146, HDL 54, LDL 81. CMET normal.  Assessment / Plan: 1.  Aortic stenosis: Severe. Now s/p TAVR with excellent result. Valve sounds normal today.  2.  Paroxysmal atrial fibrillation.  on Eliquis. No significant sustained high rates.asymptomatic.   4. Hyperlipidemia.  5. Hypertension-controlled  6. Chronic diastolic CHF well compensated.   7. Recurrent UTI.  8. Transient CHB in setting of sepsis and on Aricept. No evidence of recurrence.   Continue current therapy  I will follow up in 6 months.

## 2017-06-25 DIAGNOSIS — M79675 Pain in left toe(s): Secondary | ICD-10-CM | POA: Diagnosis not present

## 2017-06-25 DIAGNOSIS — B351 Tinea unguium: Secondary | ICD-10-CM | POA: Diagnosis not present

## 2017-06-25 DIAGNOSIS — M79674 Pain in right toe(s): Secondary | ICD-10-CM | POA: Diagnosis not present

## 2017-06-27 ENCOUNTER — Encounter: Payer: Self-pay | Admitting: Cardiology

## 2017-06-27 ENCOUNTER — Ambulatory Visit (INDEPENDENT_AMBULATORY_CARE_PROVIDER_SITE_OTHER): Payer: Medicare Other | Admitting: Cardiology

## 2017-06-27 VITALS — BP 128/78 | HR 96 | Ht 62.0 in | Wt 137.0 lb

## 2017-06-27 DIAGNOSIS — I350 Nonrheumatic aortic (valve) stenosis: Secondary | ICD-10-CM | POA: Diagnosis not present

## 2017-06-27 DIAGNOSIS — I480 Paroxysmal atrial fibrillation: Secondary | ICD-10-CM | POA: Diagnosis not present

## 2017-06-27 DIAGNOSIS — Z7901 Long term (current) use of anticoagulants: Secondary | ICD-10-CM | POA: Diagnosis not present

## 2017-06-27 DIAGNOSIS — Z952 Presence of prosthetic heart valve: Secondary | ICD-10-CM

## 2017-06-27 NOTE — Patient Instructions (Signed)
Continue your current therapy  I will see you in 6 months.   

## 2017-07-05 DIAGNOSIS — G3183 Dementia with Lewy bodies: Secondary | ICD-10-CM | POA: Diagnosis not present

## 2017-07-05 DIAGNOSIS — G301 Alzheimer's disease with late onset: Secondary | ICD-10-CM | POA: Diagnosis not present

## 2017-07-12 ENCOUNTER — Other Ambulatory Visit: Payer: Self-pay | Admitting: Diagnostic Neuroimaging

## 2017-07-17 ENCOUNTER — Other Ambulatory Visit: Payer: Self-pay | Admitting: Diagnostic Neuroimaging

## 2017-07-17 ENCOUNTER — Encounter (INDEPENDENT_AMBULATORY_CARE_PROVIDER_SITE_OTHER): Payer: Medicare Other | Admitting: Ophthalmology

## 2017-07-17 DIAGNOSIS — H35033 Hypertensive retinopathy, bilateral: Secondary | ICD-10-CM | POA: Diagnosis not present

## 2017-07-17 DIAGNOSIS — H353231 Exudative age-related macular degeneration, bilateral, with active choroidal neovascularization: Secondary | ICD-10-CM

## 2017-07-17 DIAGNOSIS — I10 Essential (primary) hypertension: Secondary | ICD-10-CM

## 2017-07-17 DIAGNOSIS — H43813 Vitreous degeneration, bilateral: Secondary | ICD-10-CM

## 2017-07-17 NOTE — Telephone Encounter (Signed)
lmvm for [facility] tcb. sy

## 2017-07-26 ENCOUNTER — Other Ambulatory Visit: Payer: Self-pay | Admitting: Diagnostic Neuroimaging

## 2017-07-27 ENCOUNTER — Other Ambulatory Visit: Payer: Self-pay | Admitting: Diagnostic Neuroimaging

## 2017-07-29 NOTE — Telephone Encounter (Signed)
Patient needs FU or needs PCP to refill.

## 2017-08-27 DIAGNOSIS — B351 Tinea unguium: Secondary | ICD-10-CM | POA: Diagnosis not present

## 2017-08-27 DIAGNOSIS — M79675 Pain in left toe(s): Secondary | ICD-10-CM | POA: Diagnosis not present

## 2017-08-27 DIAGNOSIS — M79674 Pain in right toe(s): Secondary | ICD-10-CM | POA: Diagnosis not present

## 2017-09-16 DIAGNOSIS — R4182 Altered mental status, unspecified: Secondary | ICD-10-CM | POA: Diagnosis not present

## 2017-09-16 DIAGNOSIS — Z87440 Personal history of urinary (tract) infections: Secondary | ICD-10-CM | POA: Diagnosis not present

## 2017-09-16 DIAGNOSIS — G301 Alzheimer's disease with late onset: Secondary | ICD-10-CM | POA: Diagnosis not present

## 2017-09-18 DIAGNOSIS — D649 Anemia, unspecified: Secondary | ICD-10-CM | POA: Diagnosis not present

## 2017-09-18 DIAGNOSIS — Z79899 Other long term (current) drug therapy: Secondary | ICD-10-CM | POA: Diagnosis not present

## 2017-09-18 DIAGNOSIS — R319 Hematuria, unspecified: Secondary | ICD-10-CM | POA: Diagnosis not present

## 2017-09-18 DIAGNOSIS — N390 Urinary tract infection, site not specified: Secondary | ICD-10-CM | POA: Diagnosis not present

## 2017-09-19 ENCOUNTER — Emergency Department (HOSPITAL_COMMUNITY): Payer: Medicare Other

## 2017-09-19 ENCOUNTER — Other Ambulatory Visit: Payer: Self-pay

## 2017-09-19 ENCOUNTER — Encounter (HOSPITAL_COMMUNITY): Payer: Self-pay | Admitting: Emergency Medicine

## 2017-09-19 ENCOUNTER — Inpatient Hospital Stay (HOSPITAL_COMMUNITY)
Admission: EM | Admit: 2017-09-19 | Discharge: 2017-09-25 | DRG: 956 | Disposition: A | Discharge status: Skilled Nursing Facility | Payer: Medicare Other | Attending: Internal Medicine | Admitting: Internal Medicine

## 2017-09-19 DIAGNOSIS — I10 Essential (primary) hypertension: Secondary | ICD-10-CM | POA: Diagnosis not present

## 2017-09-19 DIAGNOSIS — F331 Major depressive disorder, recurrent, moderate: Secondary | ICD-10-CM | POA: Diagnosis not present

## 2017-09-19 DIAGNOSIS — S6991XA Unspecified injury of right wrist, hand and finger(s), initial encounter: Secondary | ICD-10-CM | POA: Diagnosis not present

## 2017-09-19 DIAGNOSIS — W19XXXA Unspecified fall, initial encounter: Secondary | ICD-10-CM | POA: Diagnosis not present

## 2017-09-19 DIAGNOSIS — N179 Acute kidney failure, unspecified: Secondary | ICD-10-CM | POA: Diagnosis not present

## 2017-09-19 DIAGNOSIS — M810 Age-related osteoporosis without current pathological fracture: Secondary | ICD-10-CM | POA: Diagnosis present

## 2017-09-19 DIAGNOSIS — F410 Panic disorder [episodic paroxysmal anxiety] without agoraphobia: Secondary | ICD-10-CM | POA: Diagnosis present

## 2017-09-19 DIAGNOSIS — Z8249 Family history of ischemic heart disease and other diseases of the circulatory system: Secondary | ICD-10-CM

## 2017-09-19 DIAGNOSIS — F0280 Dementia in other diseases classified elsewhere without behavioral disturbance: Secondary | ICD-10-CM | POA: Diagnosis present

## 2017-09-19 DIAGNOSIS — W51XXXA Accidental striking against or bumped into by another person, initial encounter: Secondary | ICD-10-CM | POA: Diagnosis present

## 2017-09-19 DIAGNOSIS — Z7901 Long term (current) use of anticoagulants: Secondary | ICD-10-CM | POA: Diagnosis not present

## 2017-09-19 DIAGNOSIS — I739 Peripheral vascular disease, unspecified: Secondary | ICD-10-CM | POA: Diagnosis not present

## 2017-09-19 DIAGNOSIS — D62 Acute posthemorrhagic anemia: Secondary | ICD-10-CM | POA: Diagnosis not present

## 2017-09-19 DIAGNOSIS — I5032 Chronic diastolic (congestive) heart failure: Secondary | ICD-10-CM | POA: Diagnosis present

## 2017-09-19 DIAGNOSIS — E7800 Pure hypercholesterolemia, unspecified: Secondary | ICD-10-CM | POA: Diagnosis present

## 2017-09-19 DIAGNOSIS — Z953 Presence of xenogenic heart valve: Secondary | ICD-10-CM

## 2017-09-19 DIAGNOSIS — Z471 Aftercare following joint replacement surgery: Secondary | ICD-10-CM | POA: Diagnosis not present

## 2017-09-19 DIAGNOSIS — S52351A Displaced comminuted fracture of shaft of radius, right arm, initial encounter for closed fracture: Secondary | ICD-10-CM | POA: Diagnosis not present

## 2017-09-19 DIAGNOSIS — F329 Major depressive disorder, single episode, unspecified: Secondary | ICD-10-CM | POA: Diagnosis present

## 2017-09-19 DIAGNOSIS — R011 Cardiac murmur, unspecified: Secondary | ICD-10-CM | POA: Diagnosis present

## 2017-09-19 DIAGNOSIS — S52591A Other fractures of lower end of right radius, initial encounter for closed fracture: Secondary | ICD-10-CM | POA: Diagnosis not present

## 2017-09-19 DIAGNOSIS — S72001D Fracture of unspecified part of neck of right femur, subsequent encounter for closed fracture with routine healing: Secondary | ICD-10-CM | POA: Diagnosis not present

## 2017-09-19 DIAGNOSIS — I350 Nonrheumatic aortic (valve) stenosis: Secondary | ICD-10-CM | POA: Diagnosis not present

## 2017-09-19 DIAGNOSIS — S72011A Unspecified intracapsular fracture of right femur, initial encounter for closed fracture: Secondary | ICD-10-CM | POA: Diagnosis present

## 2017-09-19 DIAGNOSIS — S52201B Unspecified fracture of shaft of right ulna, initial encounter for open fracture type I or II: Secondary | ICD-10-CM

## 2017-09-19 DIAGNOSIS — R293 Abnormal posture: Secondary | ICD-10-CM | POA: Diagnosis not present

## 2017-09-19 DIAGNOSIS — M6281 Muscle weakness (generalized): Secondary | ICD-10-CM | POA: Diagnosis not present

## 2017-09-19 DIAGNOSIS — S52611A Displaced fracture of right ulna styloid process, initial encounter for closed fracture: Secondary | ICD-10-CM | POA: Diagnosis not present

## 2017-09-19 DIAGNOSIS — S72001A Fracture of unspecified part of neck of right femur, initial encounter for closed fracture: Secondary | ICD-10-CM

## 2017-09-19 DIAGNOSIS — I110 Hypertensive heart disease with heart failure: Secondary | ICD-10-CM | POA: Diagnosis present

## 2017-09-19 DIAGNOSIS — Y92009 Unspecified place in unspecified non-institutional (private) residence as the place of occurrence of the external cause: Secondary | ICD-10-CM

## 2017-09-19 DIAGNOSIS — S72092A Other fracture of head and neck of left femur, initial encounter for closed fracture: Secondary | ICD-10-CM | POA: Diagnosis not present

## 2017-09-19 DIAGNOSIS — F0390 Unspecified dementia without behavioral disturbance: Secondary | ICD-10-CM | POA: Diagnosis not present

## 2017-09-19 DIAGNOSIS — N3020 Other chronic cystitis without hematuria: Secondary | ICD-10-CM | POA: Diagnosis present

## 2017-09-19 DIAGNOSIS — R41841 Cognitive communication deficit: Secondary | ICD-10-CM | POA: Diagnosis not present

## 2017-09-19 DIAGNOSIS — Z87891 Personal history of nicotine dependence: Secondary | ICD-10-CM

## 2017-09-19 DIAGNOSIS — Z87440 Personal history of urinary (tract) infections: Secondary | ICD-10-CM

## 2017-09-19 DIAGNOSIS — I480 Paroxysmal atrial fibrillation: Secondary | ICD-10-CM | POA: Diagnosis present

## 2017-09-19 DIAGNOSIS — E785 Hyperlipidemia, unspecified: Secondary | ICD-10-CM | POA: Diagnosis present

## 2017-09-19 DIAGNOSIS — R279 Unspecified lack of coordination: Secondary | ICD-10-CM | POA: Diagnosis not present

## 2017-09-19 DIAGNOSIS — S52251B Displaced comminuted fracture of shaft of ulna, right arm, initial encounter for open fracture type I or II: Secondary | ICD-10-CM | POA: Diagnosis not present

## 2017-09-19 DIAGNOSIS — Z419 Encounter for procedure for purposes other than remedying health state, unspecified: Secondary | ICD-10-CM

## 2017-09-19 DIAGNOSIS — S52551A Other extraarticular fracture of lower end of right radius, initial encounter for closed fracture: Secondary | ICD-10-CM | POA: Diagnosis present

## 2017-09-19 DIAGNOSIS — W010XXA Fall on same level from slipping, tripping and stumbling without subsequent striking against object, initial encounter: Secondary | ICD-10-CM | POA: Diagnosis present

## 2017-09-19 DIAGNOSIS — S62101B Fracture of unspecified carpal bone, right wrist, initial encounter for open fracture: Secondary | ICD-10-CM | POA: Diagnosis present

## 2017-09-19 DIAGNOSIS — I447 Left bundle-branch block, unspecified: Secondary | ICD-10-CM | POA: Diagnosis present

## 2017-09-19 DIAGNOSIS — M25571 Pain in right ankle and joints of right foot: Secondary | ICD-10-CM | POA: Diagnosis not present

## 2017-09-19 DIAGNOSIS — Z96641 Presence of right artificial hip joint: Secondary | ICD-10-CM | POA: Diagnosis not present

## 2017-09-19 DIAGNOSIS — S52601B Unspecified fracture of lower end of right ulna, initial encounter for open fracture type I or II: Secondary | ICD-10-CM | POA: Diagnosis present

## 2017-09-19 DIAGNOSIS — Z9849 Cataract extraction status, unspecified eye: Secondary | ICD-10-CM | POA: Diagnosis not present

## 2017-09-19 DIAGNOSIS — S52691B Other fracture of lower end of right ulna, initial encounter for open fracture type I or II: Secondary | ICD-10-CM | POA: Diagnosis not present

## 2017-09-19 DIAGNOSIS — R531 Weakness: Secondary | ICD-10-CM | POA: Diagnosis not present

## 2017-09-19 DIAGNOSIS — H353290 Exudative age-related macular degeneration, unspecified eye, stage unspecified: Secondary | ICD-10-CM | POA: Diagnosis present

## 2017-09-19 DIAGNOSIS — F2089 Other schizophrenia: Secondary | ICD-10-CM | POA: Diagnosis not present

## 2017-09-19 DIAGNOSIS — S72001S Fracture of unspecified part of neck of right femur, sequela: Secondary | ICD-10-CM | POA: Diagnosis not present

## 2017-09-19 DIAGNOSIS — Z743 Need for continuous supervision: Secondary | ICD-10-CM | POA: Diagnosis not present

## 2017-09-19 DIAGNOSIS — T148XXA Other injury of unspecified body region, initial encounter: Secondary | ICD-10-CM | POA: Diagnosis not present

## 2017-09-19 DIAGNOSIS — Z09 Encounter for follow-up examination after completed treatment for conditions other than malignant neoplasm: Secondary | ICD-10-CM

## 2017-09-19 DIAGNOSIS — S52501D Unspecified fracture of the lower end of right radius, subsequent encounter for closed fracture with routine healing: Secondary | ICD-10-CM | POA: Diagnosis not present

## 2017-09-19 DIAGNOSIS — Z66 Do not resuscitate: Secondary | ICD-10-CM | POA: Diagnosis present

## 2017-09-19 DIAGNOSIS — S72041A Displaced fracture of base of neck of right femur, initial encounter for closed fracture: Secondary | ICD-10-CM | POA: Diagnosis not present

## 2017-09-19 DIAGNOSIS — Z79899 Other long term (current) drug therapy: Secondary | ICD-10-CM | POA: Diagnosis not present

## 2017-09-19 DIAGNOSIS — D6959 Other secondary thrombocytopenia: Secondary | ICD-10-CM | POA: Diagnosis not present

## 2017-09-19 DIAGNOSIS — R1319 Other dysphagia: Secondary | ICD-10-CM | POA: Diagnosis not present

## 2017-09-19 DIAGNOSIS — S72002A Fracture of unspecified part of neck of left femur, initial encounter for closed fracture: Secondary | ICD-10-CM | POA: Diagnosis not present

## 2017-09-19 DIAGNOSIS — R262 Difficulty in walking, not elsewhere classified: Secondary | ICD-10-CM | POA: Diagnosis not present

## 2017-09-19 DIAGNOSIS — S52351B Displaced comminuted fracture of shaft of radius, right arm, initial encounter for open fracture type I or II: Secondary | ICD-10-CM | POA: Diagnosis not present

## 2017-09-19 DIAGNOSIS — S52601A Unspecified fracture of lower end of right ulna, initial encounter for closed fracture: Secondary | ICD-10-CM | POA: Diagnosis not present

## 2017-09-19 DIAGNOSIS — S5291XB Unspecified fracture of right forearm, initial encounter for open fracture type I or II: Secondary | ICD-10-CM

## 2017-09-19 LAB — COMPREHENSIVE METABOLIC PANEL
ALBUMIN: 3.4 g/dL — AB (ref 3.5–5.0)
ALT: 16 U/L (ref 14–54)
ANION GAP: 10 (ref 5–15)
AST: 29 U/L (ref 15–41)
Alkaline Phosphatase: 102 U/L (ref 38–126)
BILIRUBIN TOTAL: 1 mg/dL (ref 0.3–1.2)
BUN: 12 mg/dL (ref 6–20)
CO2: 27 mmol/L (ref 22–32)
Calcium: 8.7 mg/dL — ABNORMAL LOW (ref 8.9–10.3)
Chloride: 105 mmol/L (ref 101–111)
Creatinine, Ser: 1.06 mg/dL — ABNORMAL HIGH (ref 0.44–1.00)
GFR calc Af Amer: 52 mL/min — ABNORMAL LOW (ref >60)
GFR, EST NON AFRICAN AMERICAN: 45 mL/min — AB (ref >60)
Glucose, Bld: 145 mg/dL — ABNORMAL HIGH (ref 65–99)
POTASSIUM: 3.7 mmol/L (ref 3.5–5.1)
Sodium: 142 mmol/L (ref 135–145)
TOTAL PROTEIN: 6.6 g/dL (ref 6.5–8.1)

## 2017-09-19 LAB — CBC WITH DIFFERENTIAL/PLATELET
ABS IMMATURE GRANULOCYTES: 0.1 10*3/uL (ref 0.0–0.1)
Basophils Absolute: 0 10*3/uL (ref 0.0–0.1)
Basophils Relative: 0 %
Eosinophils Absolute: 0.1 10*3/uL (ref 0.0–0.7)
Eosinophils Relative: 1 %
HEMATOCRIT: 42.3 % (ref 36.0–46.0)
HEMOGLOBIN: 13.6 g/dL (ref 12.0–15.0)
IMMATURE GRANULOCYTES: 1 %
Lymphocytes Relative: 27 %
Lymphs Abs: 3.1 10*3/uL (ref 0.7–4.0)
MCH: 27 pg (ref 26.0–34.0)
MCHC: 32.2 g/dL (ref 30.0–36.0)
MCV: 83.9 fL (ref 78.0–100.0)
MONO ABS: 0.7 10*3/uL (ref 0.1–1.0)
Monocytes Relative: 6 %
NEUTROS ABS: 7.5 10*3/uL (ref 1.7–7.7)
NEUTROS PCT: 65 %
Platelets: 217 10*3/uL (ref 150–400)
RBC: 5.04 MIL/uL (ref 3.87–5.11)
RDW: 14.6 % (ref 11.5–15.5)
WBC: 11.5 10*3/uL — ABNORMAL HIGH (ref 4.0–10.5)

## 2017-09-19 LAB — PROTIME-INR
INR: 1.08
PROTHROMBIN TIME: 14 s (ref 11.4–15.2)

## 2017-09-19 LAB — TYPE AND SCREEN
ABO/RH(D): A NEG
Antibody Screen: NEGATIVE

## 2017-09-19 LAB — SURGICAL PCR SCREEN
MRSA, PCR: NEGATIVE
Staphylococcus aureus: NEGATIVE

## 2017-09-19 MED ORDER — POVIDONE-IODINE 10 % EX SWAB: 2.0000 "application " | Freq: Once | CUTANEOUS | Status: DC

## 2017-09-19 MED ORDER — HYDROMORPHONE HCL 2 MG/ML IJ SOLN
0.5000 mg | INTRAMUSCULAR | Status: DC | PRN
Start: 2017-09-19 — End: 2017-09-19
  Administered 2017-09-19: 0.5 mg via INTRAVENOUS
  Filled 2017-09-19: qty 1

## 2017-09-19 MED ORDER — MORPHINE SULFATE (PF) 4 MG/ML IV SOLN
0.5000 mg | INTRAVENOUS | Status: DC | PRN
  Administered 2017-09-19 – 2017-09-21 (×7): 0.52 mg via INTRAVENOUS
  Filled 2017-09-19 (×7): qty 1

## 2017-09-19 MED ORDER — CEFAZOLIN SODIUM-DEXTROSE 2-4 GM/100ML-% IV SOLN
2.0000 g | INTRAVENOUS | Status: AC
  Administered 2017-09-20: 2 g via INTRAVENOUS
  Filled 2017-09-19 (×2): qty 100

## 2017-09-19 MED ORDER — CEFAZOLIN SODIUM-DEXTROSE 1-4 GM/50ML-% IV SOLN
1.0000 g | Freq: Once | INTRAVENOUS | Status: AC
  Administered 2017-09-19: 1 g via INTRAVENOUS
  Filled 2017-09-19: qty 50

## 2017-09-19 MED ORDER — SODIUM CHLORIDE 0.9 % IV SOLN
INTRAVENOUS | Status: AC
  Administered 2017-09-19: 16:00:00 via INTRAVENOUS

## 2017-09-19 MED ORDER — CHLORHEXIDINE GLUCONATE 4 % EX LIQD
60.0000 mL | Freq: Once | CUTANEOUS | Status: AC
  Administered 2017-09-20: 4 via TOPICAL
  Filled 2017-09-19 (×2): qty 60

## 2017-09-19 MED ORDER — METOPROLOL TARTRATE 5 MG/5ML IV SOLN
2.5000 mg | Freq: Four times a day (QID) | INTRAVENOUS | Status: DC
  Administered 2017-09-19 – 2017-09-20 (×3): 2.5 mg via INTRAVENOUS
  Filled 2017-09-19 (×3): qty 5

## 2017-09-19 MED ORDER — HYDROCODONE-ACETAMINOPHEN 5-325 MG PO TABS: 1.0000 | ORAL_TABLET | Freq: Four times a day (QID) | ORAL | Status: DC | PRN

## 2017-09-19 MED ORDER — ONDANSETRON HCL 4 MG/2ML IJ SOLN: 4.0000 mg | Freq: Four times a day (QID) | INTRAMUSCULAR | Status: DC | PRN

## 2017-09-19 MED ORDER — ONDANSETRON HCL 4 MG/2ML IJ SOLN
4.0000 mg | Freq: Once | INTRAMUSCULAR | Status: AC | PRN
  Administered 2017-09-19: 4 mg via INTRAVENOUS
  Filled 2017-09-19: qty 2

## 2017-09-19 MED ORDER — METHOCARBAMOL 1000 MG/10ML IJ SOLN
500.0000 mg | Freq: Four times a day (QID) | INTRAVENOUS | Status: DC | PRN
  Filled 2017-09-19: qty 5

## 2017-09-19 MED ORDER — METHOCARBAMOL 500 MG PO TABS
500.0000 mg | ORAL_TABLET | Freq: Four times a day (QID) | ORAL | Status: DC | PRN
  Administered 2017-09-22 – 2017-09-25 (×5): 500 mg via ORAL
  Filled 2017-09-19 (×6): qty 1

## 2017-09-19 MED ORDER — POLYETHYLENE GLYCOL 3350 17 G PO PACK: 17.0000 g | PACK | Freq: Every day | ORAL | Status: DC | PRN

## 2017-09-19 NOTE — ED Triage Notes (Signed)
Pt is from [facility] on [address] and she fell after getting her feet tangled landing on rt side , c/o rt hip, back, side pain, and rt arm pain has obvious deformity to rt lower arm and lac to lower arm bleeding controlled  At this time, did not hit her head no loc but did vomit x 1 but she had just eaten breakfast

## 2017-09-19 NOTE — ED Notes (Signed)
Jennifer-Orth Tech called @ 1313-per Dr. Ronelle Nigh by Marylene Land

## 2017-09-19 NOTE — Progress Notes (Signed)
Orthopedic Tech Progress Note Patient Details:  Rebecca [name] 23-Jun-1925 [id number]  Ortho Devices Type of Ortho Device: Arm sling, Ace wrap, Sugartong splint Ortho Device/Splint Interventions: Application   Post Interventions Patient Tolerated: Well Instructions Provided: Care of device   Saul Fordyce 09/19/2017, 2:07 PM

## 2017-09-19 NOTE — Progress Notes (Signed)
Orthopedic Tech Progress Note Patient Details:  Rebecca [name] 1925/11/22 [id number]  Musculoskeletal Traction Type of Traction: Bucks Skin Traction Traction Weight: 10 lbs   Post Interventions Patient Tolerated: Well Instructions Provided: Care of device   Saul Fordyce 09/19/2017, 2:06 PM

## 2017-09-19 NOTE — ED Notes (Signed)
Admit dr into see pt 

## 2017-09-19 NOTE — H&P (Signed)
History and Physical    Rebecca [name] ZOX:[id number] DOB: January 08, 1926 DOA: 09/19/2017  PCP: Daisy Floro, MD Patient coming from: [facility]  Chief Complaint: fall/fx hip/fx wrist  HPI: Rebecca [name] is a pleasantly demented 84 y.o. female with medical history significant PAF on Eliquis, hypertension, dementia, aortic stenosis s/p TAVR presents to the emergency department from facility via EMS chief complaint right hip pain right wrist pain after a fall. Initial evaluation reveals open fracture of the right wrist closedright hip fracture. [facility] were asked to admit  Information is obtained from the chart and the daughter who is at the bedside. Daughter states over the last 2-3 weeks patient has intermittently declared "I distal feel good". She reports that patient has been "sleeping a lot. She states she asked the facility to obtain a urinalysis as in the past the symptoms typically meant patient had a UTI. Daughter reports patient with a history of recurrent urinary tract infections. Other than  that she has had no complaints. No reports of any fever chest pain palpitation shortness of breath. No nausea vomiting diarrhea constipation. No complaints of any dysuria hematuria frequency or urgency. Reports of his syncope or near-syncope. Accordingly patient got tangled up with another resident and fell on her right side. Daughter reports that patient told her she was tried to find a bathroom and in her rush she "ran into" another person.   ED Course: in the emergency department she's afebrile with a blood pressure on the high and of normal.She's not hypoxic. She is nontoxic appearing. She is provided with IV fluids analgesia bucks traction  Review of Systems: As per HPI otherwise all other systems reviewed and are negative.   Ambulatory Status: ambulates with a walker at baseline.  Past Medical History:  Diagnosis Date  . A-fib (HCC)   . Age-related macular degeneration, wet,  right eye (HCC)   . Aortic stenosis   . Aortic stenosis   . Dementia    "slight"/daughter 12/02/2013  . Dementia with Lewy bodies   . Depression   . Heart block   . Heart murmur    "slight"  . High cholesterol   . Hypertension   . Panic attacks   . Paroxysmal atrial fibrillation (HCC)   . S/P TAVR (transcatheter aortic valve replacement) 02/02/2014   23 mm Edwards Sapien XT transcatheter heart valve placed via open right transfemoral approach  . Syncope 08/21/2012  . UTI (lower urinary tract infection)     Past Surgical History:  Procedure Laterality Date  . CARDIAC CATHETERIZATION  04/2013  . EYE SURGERY     cataracts  . INTRAOPERATIVE TRANSESOPHAGEAL ECHOCARDIOGRAM N/A 02/02/2014   Procedure: INTRAOPERATIVE TRANSESOPHAGEAL ECHOCARDIOGRAM;  Surgeon: Tonny Bollman, MD;  Location: [facility] OR;  Service: Open Heart Surgery;  Laterality: N/A;  . LEFT AND RIGHT HEART CATHETERIZATION WITH CORONARY ANGIOGRAM N/A 05/25/2013   Procedure: LEFT AND RIGHT HEART CATHETERIZATION WITH CORONARY ANGIOGRAM;  Surgeon: [name] [name], MD;  Location: [facility] CATH LAB;  Service: Cardiovascular;  Laterality: N/A;  . TONSILLECTOMY    . TRANSCATHETER AORTIC VALVE REPLACEMENT, TRANSFEMORAL  02/02/2014   Edwards Sapien XT THV (size 23 mm, model # 9300TFX, serial # [id number])  . TRANSCATHETER AORTIC VALVE REPLACEMENT, TRANSFEMORAL N/A 02/02/2014   Procedure: TRANSCATHETER AORTIC VALVE REPLACEMENT, TRANSFEMORAL;  Surgeon: Tonny Bollman, MD;  Location: [facility] OR;  Service: Open Heart Surgery;  Laterality: N/A;    Social History   Socioeconomic History  . Marital status: Married    Spouse name:  [name]  . Number of children: 3  . Years of education: 11th  . Highest education level: Not on file  Occupational History  . Occupation: Retired  Engineer, production  . Financial resource strain: Not on file  . Food insecurity:    Worry: Not on file    Inability: Not on file  . Transportation needs:    Medical: Not on file     Non-medical: Not on file  Tobacco Use  . Smoking status: Former Smoker    Packs/day: 0.40    Years: 30.00    Pack years: 12.00    Types: Cigarettes    Last attempt to quit: 04/30/1960    Years since quitting: 57.4  . Smokeless tobacco: Never Used  Substance and Sexual Activity  . Alcohol use: No  . Drug use: No  . Sexual activity: Not on file  Lifestyle  . Physical activity:    Days per week: Not on file    Minutes per session: Not on file  . Stress: Not on file  Relationships  . Social connections:    Talks on phone: Not on file    Gets together: Not on file    Attends religious service: Not on file    Active member of club or organization: Not on file    Attends meetings of clubs or organizations: Not on file    Relationship status: Not on file  . Intimate partner violence:    Fear of current or ex partner: Not on file    Emotionally abused: Not on file    Physically abused: Not on file    Forced sexual activity: Not on file  Other Topics Concern  . Not on file  Social History Narrative   Pt lives at home with her daughter.   Married 65 yrs.   Caffeine Use: 2-3 cups daily    No Known Allergies  Family History  Problem Relation Age of Onset  . Heart attack Mother 62  . Heart Problems Father   . Heart attack Father 40  . Cerebral aneurysm Sister 30  . Sudden death Brother 25    Prior to Admission medications   Medication Sig Start Date End Date Taking? Authorizing Provider  Acetaminophen (MAPAP) 500 MG coapsule Take 500 mg by mouth every 4 (four) hours as needed for fever.   Yes [provider]  alum & mag hydroxide-simeth (MINTOX) 200-200-20 MG/5ML suspension Take by mouth every 6 (six) hours as needed for indigestion or heartburn.   Yes [provider]  amLODipine (NORVASC) 5 MG tablet Take 1 tablet (5 mg total) by mouth daily with breakfast. 05/14/17  Yes [name], [name], MD  Cranberry 450 MG TABS Take 450 mg by mouth 2 (two) times daily.    Yes [provider]  ELIQUIS 2.5 MG TABS tablet TAKE 1 TABLET (2.5 MG TOTAL) BY MOUTH 2 (TWO) TIMES DAILY. 11/08/16  Yes [name], [name], MD  estradiol (ESTRACE) 0.1 MG/GM vaginal cream Place 1 Applicatorful vaginally once a week.    Yes [provider]  guaifenesin (ROBITUSSIN) 100 MG/5ML syrup Take 200 mg by mouth every 6 (six) hours as needed for cough.    Yes [provider]  loperamide (LOPERAMIDE A-D) 2 MG tablet Take 2 mg by mouth as needed for diarrhea or loose stools (do not exceed 8 doses in 24 hrs).    Yes [provider]  magnesium hydroxide (MILK OF MAGNESIA) 400 MG/5ML suspension Take 30 mLs by mouth  daily as needed for mild constipation.   Yes [provider]  memantine (NAMENDA) 10 MG tablet TAKE 1 TABLET (10 MG TOTAL) BY MOUTH 2 (TWO) TIMES DAILY. 04/16/17  Yes Penumalli, Glenford Bayley, MD  mirtazapine (REMERON) 30 MG tablet Take 30 mg by mouth at bedtime.   Yes [provider]  neomycin-bacitracin-polymyxin (NEOSPORIN) ointment Apply 1 application topically as needed for wound care. Skin tears and Abrasions    Yes [provider]  NUTRITIONAL SUPPLEMENT LIQD Take 1 Bottle by mouth 3 (three) times daily with meals. *House Shake*   Yes [provider]  sertraline (ZOLOFT) 100 MG tablet Take 100 mg by mouth daily with breakfast.  07/20/15  Yes [provider]  trimethoprim (TRIMPEX) 100 MG tablet Take 100 mg by mouth at bedtime.  06/24/17  Yes [provider]    Physical Exam: Vitals:   09/19/17 1330 09/19/17 1345 09/19/17 1400 09/19/17 1415  BP: (!) 154/82  (!) 164/84   Pulse: 89 90 89 91  Resp: Temp:      TempSrc:      SpO2: 100% 100% 100% 98%     General:  Thin frail slightly pale lying in bed with eyes closed. Opens eyes to verbal stimuli in no acute distress Eyes:  PERRL, EOMI, normal lids, iris ENT:  grossly normal hearing, lips & tongue, mucous membranes of her mouth are  slightly pale somewhat dry Neck:  no LAD, masses or thyromegaly Cardiovascular:  RRR, +murmur. No LE edema. Right leg externally rotated bucks traction in tact. Toes warm to touch Respiratory:  CTA bilaterally, no w/r/r. Normal respiratory effort. Abdomen:  soft, ntnd, ositive bowel sounds throughout no guarding or rebounding Skin:  no rash or induration seen on limited exam Musculoskeletal: right leg as noted above. Right arm/wrist small laceration swelling deformity decreased range of motion tender to palpation pulse palpable, fingers warm other joints without swelling/erythema Psychiatric:  grossly normal mood and affect, speech fluent and appropriate,  Neurologic:  Somewhat lethargic but arouses to verbal stimuli oriented to self only. Speech clear facial symmetry  Labs on Admission: I have personally reviewed following labs and imaging studies  CBC: Recent Labs  Lab 09/19/17 1058  WBC 11.5*  NEUTROABS 7.5  HGB 13.6  HCT 42.3  MCV 83.9  PLT 217   Basic Metabolic Panel: Recent Labs  Lab 09/19/17 1205  NA 142  K 3.7  CL 105  CO2 27  GLUCOSE 145*  BUN 12  CREATININE 1.06*  CALCIUM 8.7*   GFR: CrCl cannot be calculated (Unknown ideal weight.). Liver Function Tests: Recent Labs  Lab 09/19/17 1205  AST 29  ALT 16  ALKPHOS 102  BILITOT 1.0  PROT 6.6  ALBUMIN 3.4*   No results for input(s): LIPASE, AMYLASE in the last 168 hours. No results for input(s): AMMONIA in the last 168 hours. Coagulation Profile: Recent Labs  Lab 09/19/17 1205  INR 1.08   Cardiac Enzymes: No results for input(s): CKTOTAL, CKMB, CKMBINDEX, TROPONINI in the last 168 hours. BNP (last 3 results) No results for input(s): PROBNP in the last 8760 hours. HbA1C: No results for input(s): HGBA1C in the last 72 hours. CBG: No results for input(s): GLUCAP in the last 168 hours. Lipid Profile: No results for input(s): CHOL, HDL, LDLCALC, TRIG, CHOLHDL, LDLDIRECT in the last 72 hours. Thyroid  Function Tests: No results for input(s): TSH, T4TOTAL, FREET4, T3FREE, THYROIDAB in the last 72 hours. Anemia Panel: No results for  input(s): VITAMINB12, FOLATE, FERRITIN, TIBC, IRON, RETICCTPCT in the last 72 hours. Urine analysis:    Component Value Date/Time   COLORURINE YELLOW 11/12/2016 1845   APPEARANCEUR HAZY (A) 11/12/2016 1845   LABSPEC 1.006 11/12/2016 1845   PHURINE 6.0 11/12/2016 1845   GLUCOSEU NEGATIVE 11/12/2016 1845   HGBUR MODERATE (A) 11/12/2016 1845   BILIRUBINUR NEGATIVE 11/12/2016 1845   KETONESUR NEGATIVE 11/12/2016 1845   PROTEINUR 100 (A) 11/12/2016 1845   UROBILINOGEN 0.2 06/03/2014 0740   NITRITE POSITIVE (A) 11/12/2016 1845   LEUKOCYTESUR LARGE (A) 11/12/2016 1845    Creatinine Clearance: CrCl cannot be calculated (Unknown ideal weight.).  Sepsis Labs: (procalcitonin:4,lacticidven:4) )No results found for this or any previous visit (from the past 240 hour(s)).   Radiological Exams on Admission: Dg Chest 1 View  Result Date: 09/19/2017 CLINICAL DATA:  Weakness. The patient suffered a right hip fracture due to a fall today. EXAM: CHEST  1 VIEW COMPARISON:  PA and lateral chest 11/12/2016. FINDINGS: The lungs are clear. Heart size is upper normal. Aortic atherosclerosis is identified. The patient is status post aortic valve repair. No pneumothorax or pleural effusion. No acute bony abnormality. IMPRESSION: No acute disease. Atherosclerosis. Electronically Signed   By: Drusilla Kanner M.D.   On: 09/19/2017 11:45   Dg Wrist Complete Right  Result Date: 09/19/2017 CLINICAL DATA:  Fall.  Wrist injury. EXAM: RIGHT WRIST - COMPLETE 3+ VIEW COMPARISON:  No recent prior. FINDINGS: Severely displaced comminuted distal right radial metaphyseal fracture is present. Displaced distal ulnar fracture is present. Prominent overlying of fracture fragments noted. Diffuse osteopenia and degenerative change. IMPRESSION: Severely displaced and comminuted distal right  radial metaphyseal fracture. Displaced distal ulnar fracture is also present. Fracture fragments are overriding. Electronically Signed   By: Maisie Fus  Register   On: 09/19/2017 11:48   Dg Hand Complete Right  Result Date: 09/19/2017 CLINICAL DATA:  Fall.  Wrist injury. EXAM: RIGHT HAND - COMPLETE 3+ VIEW COMPARISON:  No prior. FINDINGS: Comminuted displaced overriding fractures are noted of the distal radius and ulna. Diffuse osteopenia degenerative change. No radiopaque foreign body. Peripheral vascular calcification. IMPRESSION: 1. Displaced comminuted angulated fractures of the distal radius and ulna. 2.  Peripheral vascular disease. Electronically Signed   By: Maisie Fus  Register   On: 09/19/2017 11:49   Dg Hip Unilat With Pelvis 2-3 Views Right  Result Date: 09/19/2017 CLINICAL DATA:  Right hip pain after fall. EXAM: DG HIP (WITH OR WITHOUT PELVIS) 2-3V RIGHT COMPARISON:  CT abdomen pelvis dated November 13, 2016. FINDINGS: Acute, displaced fracture through the right femoral neck. No dislocation. The left hip is unremarkable. The pubic symphysis and sacroiliac joints are intact. Osteopenia. Unchanged calcifications in the left pelvis associated with known large dermoid. IMPRESSION: 1. Acute, displaced right femoral neck fracture. Electronically Signed   By: Obie Dredge M.D.   On: 09/19/2017 11:47    EKG: Independently reviewed. Sinus rhythm Multiple ventricular premature complexes Left bundle branch block  Assessment/Plan Principal Problem:   Closed right hip fracture, initial encounter [facility]) Active Problems:   Right wrist fracture, open, initial encounter   Essential hypertension   Aortic stenosis, severe   PAF (paroxysmal atrial fibrillation) (HCC)   Dementia   #1. Closed right hip fracture secondary to mechanical fall. X-ray of right hip reveals acute displaced right femoral neck fracture. Evaluated by orthopedic surgery who recommend Buck's traction and surgery likely  tomorrow. -Admit -Supportive therapy -robaxin -monitor  #2. Right wrist fracture open. X-ray reveals severely displaced and  comminuted distal right radial metaphyseal fracture, displaced distal ulnar fracture also present. Dr. Merlyn Lot with hand surgery contacted per ortho note and requested patient be kept npo -supportive therapy -Await Dr Merlyn Lot recommendation  #3. PAF/AS status post valve replacement. Home medications include L request. Currently rate controlled. [place] score at least 4.EKG as noted above. No rate controlled med on home list. A rate in the 90s during her time in the emergency department. -iv metoprolol with parameters -hold eliquis  #4. Hypertension. Blood pressure on the high end of normal. Home medications include amlodipine -Continue amlodipine -monitor  #5. Dementia. Daughter reports progression of the last several months. Baseline patient knows who she is and recognizes family but not always sure of where she is. Incontinent of urine. Able to make once a needs known    DVT prophylaxis: scd  Code Status: dnr  Family Communication: daughter at bedside  Disposition Plan: back to facility  Consults called: kuzma with hand surgery, micheal jeffrey orhtho  Admission status: inpatient    Gwenyth Bender MD [facility]  If 7PM-7AM, please contact night-coverage [email or web address] Password [facility]  09/19/2017, 3:27 PM

## 2017-09-19 NOTE — ED Notes (Signed)
Report to 5 n

## 2017-09-19 NOTE — ED Notes (Signed)
Ortho in to do splints and traction

## 2017-09-19 NOTE — ED Notes (Signed)
Rebecca [name] here to see pt pa for ortho, pt to go to or but maybe not today

## 2017-09-19 NOTE — Consult Note (Signed)
Rebecca [name] is an 84 y.o. female.   Chief Complaint: right wrist fracture HPI: 84 yo rhd female present with daughter (POA).  They state she fell at home this morning injuring right wrist and hip.  Seen in ED where XR reveal right hip and distal radius fracture.  She notes previous wrist fracture, but cannot remember which wrist.  Rates pain at 8/10.  Alleviated with immobilization and aggravated with motion.  In sugartong splint which is helpful.  Case discussed with Dr. Regenia Skeeter and his note from 09/19/2017 reviewed. Xrays viewed and interpreted by me: ap,lateral,oblique views right wrist and hand show distal radius and ulna fractures with displacement. Labs reviewed: WBC 11.5  Allergies: No Known Allergies  Past Medical History:  Diagnosis Date  . Age-related macular degeneration, wet, right eye ([place])   . Dementia with Lewy bodies   . Depression   . Heart block   . Heart murmur    "slight"  . High cholesterol   . Hypertension   . Panic attacks   . Paroxysmal atrial fibrillation (HCC)   . S/P TAVR (transcatheter aortic valve replacement) 02/02/2014   23 mm Edwards Sapien XT transcatheter heart valve placed via open right transfemoral approach  . Syncope 08/21/2012  . UTI (lower urinary tract infection)     Past Surgical History:  Procedure Laterality Date  . CARDIAC CATHETERIZATION  04/2013  . EYE SURGERY     cataracts  . INTRAOPERATIVE TRANSESOPHAGEAL ECHOCARDIOGRAM N/A 02/02/2014   Procedure: INTRAOPERATIVE TRANSESOPHAGEAL ECHOCARDIOGRAM;  Surgeon: Sherren Mocha, MD;  Location: [facility] OR;  Service: Open Heart Surgery;  Laterality: N/A;  . LEFT AND RIGHT HEART CATHETERIZATION WITH CORONARY ANGIOGRAM N/A 05/25/2013   Procedure: LEFT AND RIGHT HEART CATHETERIZATION WITH CORONARY ANGIOGRAM;  Surgeon: [name] Martinique, MD;  Location: [facility] CATH LAB;  Service: Cardiovascular;  Laterality: N/A;  . TONSILLECTOMY    . TRANSCATHETER AORTIC VALVE REPLACEMENT, TRANSFEMORAL  02/02/2014   Edwards  Sapien XT THV (size 23 mm, model # 9300TFX, serial # [id number])  . TRANSCATHETER AORTIC VALVE REPLACEMENT, TRANSFEMORAL N/A 02/02/2014   Procedure: TRANSCATHETER AORTIC VALVE REPLACEMENT, TRANSFEMORAL;  Surgeon: Sherren Mocha, MD;  Location: [place];  Service: Open Heart Surgery;  Laterality: N/A;    Family History: Family History  Problem Relation Age of Onset  . Heart attack Mother 10  . Heart Problems Father   . Heart attack Father 90  . Cerebral aneurysm Sister 13  . Sudden death Brother 63    Social History:   reports that she quit smoking about 57 years ago. Her smoking use included cigarettes. She has a 12.00 pack-year smoking history. She has never used smokeless tobacco. She reports that she does not drink alcohol or use drugs.  Medications: Medications Prior to Admission  Medication Sig Dispense Refill  . Acetaminophen (MAPAP) 500 MG coapsule Take 500 mg by mouth every 4 (four) hours as needed for fever.    [name] alum & mag hydroxide-simeth (Louisa) 200-200-20 MG/5ML suspension Take by mouth every 6 (six) hours as needed for indigestion or heartburn.    [name] amLODipine (NORVASC) 5 MG tablet Take 1 tablet (5 mg total) by mouth daily with breakfast. 90 tablet 1  . Cranberry 450 MG TABS Take 450 mg by mouth 2 (two) times daily.    [name] ELIQUIS 2.5 MG TABS tablet TAKE 1 TABLET (2.5 MG TOTAL) BY MOUTH 2 (TWO) TIMES DAILY. 60 tablet 5  . estradiol (ESTRACE) 0.1 MG/GM vaginal cream Place 1 Applicatorful vaginally once a  week.     . guaifenesin (ROBITUSSIN) 100 MG/5ML syrup Take 200 mg by mouth every 6 (six) hours as needed for cough.     . loperamide (LOPERAMIDE A-D) 2 MG tablet Take 2 mg by mouth as needed for diarrhea or loose stools (do not exceed 8 doses in 24 hrs).     . magnesium hydroxide (MILK OF MAGNESIA) 400 MG/5ML suspension Take 30 mLs by mouth daily as needed for mild constipation.    . memantine (NAMENDA) 10 MG tablet TAKE 1 TABLET (10 MG TOTAL) BY MOUTH 2 (TWO) TIMES DAILY. 180  tablet 0  . mirtazapine (REMERON) 30 MG tablet Take 30 mg by mouth at bedtime.    [name] neomycin-bacitracin-polymyxin (NEOSPORIN) ointment Apply 1 application topically as needed for wound care. Skin tears and Abrasions     . NUTRITIONAL SUPPLEMENT LIQD Take 1 Bottle by mouth 3 (three) times daily with meals. *House Shake*    . sertraline (ZOLOFT) 100 MG tablet Take 100 mg by mouth daily with breakfast.   5  . trimethoprim (TRIMPEX) 100 MG tablet Take 100 mg by mouth at bedtime.       Results for orders placed or performed during the hospital encounter of 09/19/17 (from the past 48 hour(s))  CBC WITH DIFFERENTIAL     Status: Abnormal   Collection Time: 09/19/17 10:58 AM  Result Value Ref Range   WBC 11.5 (H) 4.0 - 10.5 K/uL   RBC 5.04 3.87 - 5.11 MIL/uL   Hemoglobin 13.6 12.0 - 15.0 g/dL   HCT 42.3 36.0 - 46.0 %   MCV 83.9 78.0 - 100.0 fL   MCH 27.0 26.0 - 34.0 pg   MCHC 32.2 30.0 - 36.0 g/dL   RDW 14.6 11.5 - 15.5 %   Platelets 217 150 - 400 K/uL   Neutrophils Relative % 65 %   Neutro Abs 7.5 1.7 - 7.7 K/uL   Lymphocytes Relative 27 %   Lymphs Abs 3.1 0.7 - 4.0 K/uL   Monocytes Relative 6 %   Monocytes Absolute 0.7 0.1 - 1.0 K/uL   Eosinophils Relative 1 %   Eosinophils Absolute 0.1 0.0 - 0.7 K/uL   Basophils Relative 0 %   Basophils Absolute 0.0 0.0 - 0.1 K/uL   Immature Granulocytes 1 %   Abs Immature Granulocytes 0.1 0.0 - 0.1 K/uL    Comment: Performed at [facility], [address] [place], [place] [address]  Type and screen [place]     Status: None   Collection Time: 09/19/17 11:00 AM  Result Value Ref Range   ABO/RH(D) A NEG    Antibody Screen NEG    Sample Expiration      09/22/2017 Performed at [facility], [place] [address] [place], [place] [address]   Comprehensive metabolic panel     Status: Abnormal   Collection Time: 09/19/17 12:05 PM  Result Value Ref Range   Sodium 142 135 - 145 mmol/L   Potassium 3.7 3.5 - 5.1 mmol/L    Chloride 105 101 - 111 mmol/L   CO2 27 22 - 32 mmol/L   Glucose, Bld 145 (H) 65 - 99 mg/dL   BUN 12 6 - 20 mg/dL   Creatinine, Ser 1.06 (H) 0.44 - 1.00 mg/dL   Calcium 8.7 (L) 8.9 - 10.3 mg/dL   Total Protein 6.6 6.5 - 8.1 g/dL   Albumin 3.4 (L) 3.5 - 5.0 g/dL   AST 29 15 - 41 U/L  ALT 16 14 - 54 U/L   Alkaline Phosphatase 102 38 - 126 U/L   Total Bilirubin 1.0 0.3 - 1.2 mg/dL   GFR calc non Af Amer 45 (L) >60 mL/min   GFR calc Af Amer 52 (L) >60 mL/min    Comment: (NOTE) The eGFR has been calculated using the CKD EPI equation. This calculation has not been validated in all clinical situations. eGFR's persistently <60 mL/min signify possible Chronic Kidney Disease.    Anion gap 10 5 - 15    Comment: Performed at [facility] [address] [place], [place] [address]  Protime-INR     Status: None   Collection Time: 09/19/17 12:05 PM  Result Value Ref Range   Prothrombin Time 14.0 11.4 - 15.2 seconds   INR 1.08     Comment: Performed at [facility], [place] [address] [place], [place] [address]    Dg Chest 1 View  Result Date: 09/19/2017 CLINICAL DATA:  Weakness. The patient suffered a right hip fracture due to a fall today. EXAM: CHEST  1 VIEW COMPARISON:  PA and lateral chest 11/12/2016. FINDINGS: The lungs are clear. Heart size is upper normal. Aortic atherosclerosis is identified. The patient is status post aortic valve repair. No pneumothorax or pleural effusion. No acute bony abnormality. IMPRESSION: No acute disease. Atherosclerosis. Electronically Signed   By: Inge Rise M.D.   On: 09/19/2017 11:45   Dg Wrist Complete Right  Result Date: 09/19/2017 CLINICAL DATA:  Fall.  Wrist injury. EXAM: RIGHT WRIST - COMPLETE 3+ VIEW COMPARISON:  No recent prior. FINDINGS: Severely displaced comminuted distal right radial metaphyseal fracture is present. Displaced distal ulnar fracture is present. Prominent overlying of fracture fragments noted. Diffuse osteopenia and  degenerative change. IMPRESSION: Severely displaced and comminuted distal right radial metaphyseal fracture. Displaced distal ulnar fracture is also present. Fracture fragments are overriding. Electronically Signed   By: Marcello Moores  Register   On: 09/19/2017 11:48   Dg Hand Complete Right  Result Date: 09/19/2017 CLINICAL DATA:  Fall.  Wrist injury. EXAM: RIGHT HAND - COMPLETE 3+ VIEW COMPARISON:  No prior. FINDINGS: Comminuted displaced overriding fractures are noted of the distal radius and ulna. Diffuse osteopenia degenerative change. No radiopaque foreign body. Peripheral vascular calcification. IMPRESSION: 1. Displaced comminuted angulated fractures of the distal radius and ulna. 2.  Peripheral vascular disease. Electronically Signed   By: Marcello Moores  Register   On: 09/19/2017 11:49   Dg Hip Unilat With Pelvis 2-3 Views Right  Result Date: 09/19/2017 CLINICAL DATA:  Right hip pain after fall. EXAM: DG HIP (WITH OR WITHOUT PELVIS) 2-3V RIGHT COMPARISON:  CT abdomen pelvis dated November 13, 2016. FINDINGS: Acute, displaced fracture through the right femoral neck. No dislocation. The left hip is unremarkable. The pubic symphysis and sacroiliac joints are intact. Osteopenia. Unchanged calcifications in the left pelvis associated with known large dermoid. IMPRESSION: 1. Acute, displaced right femoral neck fracture. Electronically Signed   By: Titus Dubin M.D.   On: 09/19/2017 11:47     A comprehensive review of systems was negative except for: Hematologic/lymphatic: positive for easy bruising Review of Systems: No fevers, chills, night sweats, chest pain, shortness of breath, nausea, vomiting, diarrhea, constipation, headaches, dizziness, vision changes, fainting.   Blood pressure (!) 156/80, pulse 88, temperature 98.2 F (36.8 C), temperature source Oral, resp. rate 16, weight 62.1 kg (136 lb 14.5 oz), SpO2 94 %.  General appearance: alert, cooperative and appears stated age Head: Normocephalic, without  obvious  abnormality, atraumatic Neck: supple, symmetrical, trachea midline Extremities: Intact sensation and capillary refill all digits.  +epl/fpl/io.  Two small wounds ulnar side of wrist.  Non tender at elbow.  Tender at wrist. Pulses: 2+ and symmetric Skin: Skin color, texture, turgor normal. No rashes or lesions Neurologic: Grossly normal Incision/Wound: As above  Assessment/Plan Right distal radius and ulna fractures.  Possible open ulna fracture.  Antibiotics started in ED and wounds irrigated by ED.  Discussed injury with patient and her daughter.  Scheduled for hip surgery tomorrow.  Will try to coordinate right distal radius and possible ulna ORIF for same surgical setting.  Risks, benefits and alternatives of surgery were discussed including risks of blood loss, infection, damage to nerves/vessels/tendons/ligament/bone, failure of surgery, need for additional surgery, complication with wound healing, nonunion, malunion, stiffness.  She and her daughter voiced understanding of these risks and elected to proceed.  Surgical consent signed by daughter.  Layne Dilauro R 09/19/2017, 4:28 PM

## 2017-09-19 NOTE — ED Notes (Signed)
Pt vomited all of her breakfast  While I was drawing labs, large amt undigested food, pt given pain meds and nausea meds

## 2017-09-19 NOTE — ED Notes (Signed)
Patient transported to X-ray 

## 2017-09-19 NOTE — Progress Notes (Signed)
Pt arrived to room 5N02 via stretcher accompanied by daughter. Bucks traction in place. All questions answered to satisfaction. Received report from Aram Beecham, RN from ED. See assessment. Will continue to monitor.

## 2017-09-19 NOTE — ED Provider Notes (Signed)
[facility] EMERGENCY DEPARTMENT Provider Note   CSN: [id number] Arrival date & time: 09/19/17  2130     History   Chief Complaint Chief Complaint  Patient presents with  . Fall  . Arm Pain    HPI Rebecca [name] is a 84 y.o. female.  HPI  84 year old female presents after a fall.  Brought in by EMS.  She got her feet tangled up with another resident at the nursing home and fell on her right side.  She has a right hip injury and right wrist injury with small laceration.  The patient states she has some pain in her hand as well.  She denies any back pain, chest pain, abdominal pain.  She did not hit her head according to witnesses and the patient.  The patient was given 2 doses of 50 mcg fentanyl, still in pain.  No weakness or numbness.  Last tetanus immunization 2018.  Past Medical History:  Diagnosis Date  . Age-related macular degeneration, wet, right eye (HCC)   . Aortic stenosis   . Aortic stenosis   . Dementia    "slight"/daughter 12/02/2013  . Dementia with Lewy bodies   . Depression   . Heart block   . Heart murmur    "slight"  . High cholesterol   . Hypertension   . Panic attacks   . Paroxysmal atrial fibrillation (HCC)   . S/P TAVR (transcatheter aortic valve replacement) 02/02/2014   23 mm Edwards Sapien XT transcatheter heart valve placed via open right transfemoral approach  . Syncope 08/21/2012  . UTI (lower urinary tract infection)     Patient Active Problem List   Diagnosis Date Noted  . Sepsis (HCC)   . HCAP (healthcare-associated pneumonia) 11/12/2016  . Hypokalemia 11/12/2016  . Thrombocytopenia (HCC) 11/12/2016  . E coli bacteremia 06/13/2016  . Sepsis secondary to UTI (HCC) 06/11/2016  . Moderate episode of recurrent major depressive disorder (HCC)   . Sleep-wake disorder   . Somnolence   . Decreased appetite   . Goals of care, counseling/discussion   . Palliative care by specialist   . Advance care planning   . Pre-syncope  04/16/2016  . Confusion 04/15/2016  . MDD (major depressive disorder), single episode, moderate (HCC) 07/04/2015  . S/P TAVR (transcatheter aortic valve replacement) 02/02/2014  . Severe aortic stenosis 02/02/2014  . Sinus tachycardia 12/04/2013  . Sinus bradycardia 12/04/2013  . Encounter for therapeutic drug monitoring 06/17/2013  . Mild dementia 11/06/2012  . PAF (paroxysmal atrial fibrillation) (HCC) 10/03/2012  . Long term (current) use of anticoagulants 10/03/2012  . Syncope 08/21/2012  . Aortic stenosis, severe 07/09/2012  . Near syncope 07/08/2012  . BRONCHIECTASIS 12/09/2007  . SINUSITIS, CHRONIC 07/07/2007  . BRONCHITIS, CHRONIC 07/07/2007  . HYPERLIPIDEMIA 06/09/2007  . Essential hypertension 06/09/2007  . ALLERGIC RHINITIS 06/09/2007  . OSTEOPOROSIS 06/09/2007    Past Surgical History:  Procedure Laterality Date  . CARDIAC CATHETERIZATION  04/2013  . EYE SURGERY     cataracts  . INTRAOPERATIVE TRANSESOPHAGEAL ECHOCARDIOGRAM N/A 02/02/2014   Procedure: INTRAOPERATIVE TRANSESOPHAGEAL ECHOCARDIOGRAM;  Surgeon: Tonny Bollman, MD;  [facility] OR;  Service: Open Heart Surgery;  Laterality: N/A;  . LEFT AND RIGHT HEART CATHETERIZATION WITH CORONARY ANGIOGRAM N/A 05/25/2013   Procedure: LEFT AND RIGHT HEART CATHETERIZATION WITH CORONARY ANGIOGRAM;  Surgeon: [name] [name], MD;  Location: [facility] CATH LAB;  Service: Cardiovascular;  Laterality: N/A;  . TONSILLECTOMY    . TRANSCATHETER AORTIC VALVE REPLACEMENT, TRANSFEMORAL  02/02/2014  Edwards Sapien XT THV (size 23 mm, model # 9300TFX, serial # [id number])  . TRANSCATHETER AORTIC VALVE REPLACEMENT, TRANSFEMORAL N/A 02/02/2014   Procedure: TRANSCATHETER AORTIC VALVE REPLACEMENT, TRANSFEMORAL;  Surgeon: Tonny Bollman, MD;  Location: [facility] OR;  Service: Open Heart Surgery;  Laterality: N/A;     OB History   None      Home Medications    Prior to Admission medications   Medication Sig Start Date End Date Taking? Authorizing  Provider  Acetaminophen (MAPAP) 500 MG coapsule Take 500 mg by mouth every 4 (four) hours as needed for fever.   Yes [provider]  alum & mag hydroxide-simeth (MINTOX) 200-200-20 MG/5ML suspension Take by mouth every 6 (six) hours as needed for indigestion or heartburn.   Yes [provider]  amLODipine (NORVASC) 5 MG tablet Take 1 tablet (5 mg total) by mouth daily with breakfast. 05/14/17  Yes [name], [name], MD  Cranberry 450 MG TABS Take 450 mg by mouth 2 (two) times daily.   Yes [provider]  ELIQUIS 2.5 MG TABS tablet TAKE 1 TABLET (2.5 MG TOTAL) BY MOUTH 2 (TWO) TIMES DAILY. 11/08/16  Yes [name], [name], MD  estradiol (ESTRACE) 0.1 MG/GM vaginal cream Place 1 Applicatorful vaginally once a week.    Yes [provider]  guaifenesin (ROBITUSSIN) 100 MG/5ML syrup Take 200 mg by mouth every 6 (six) hours as needed for cough.    Yes [provider]  loperamide (LOPERAMIDE A-D) 2 MG tablet Take 2 mg by mouth as needed for diarrhea or loose stools (do not exceed 8 doses in 24 hrs).    Yes [provider]  magnesium hydroxide (MILK OF MAGNESIA) 400 MG/5ML suspension Take 30 mLs by mouth daily as needed for mild constipation.   Yes [provider]  memantine (NAMENDA) 10 MG tablet TAKE 1 TABLET (10 MG TOTAL) BY MOUTH 2 (TWO) TIMES DAILY. 04/16/17  Yes Penumalli, Glenford Bayley, MD  mirtazapine (REMERON) 30 MG tablet Take 30 mg by mouth at bedtime.   Yes [provider]  neomycin-bacitracin-polymyxin (NEOSPORIN) ointment Apply 1 application topically as needed for wound care. Skin tears and Abrasions    Yes [provider]  NUTRITIONAL SUPPLEMENT LIQD Take 1 Bottle by mouth 3 (three) times daily with meals. *House Shake*   Yes [provider]  sertraline (ZOLOFT) 100 MG tablet Take 100 mg by mouth daily with breakfast.  07/20/15  Yes [provider]  trimethoprim (TRIMPEX) 100 MG tablet Take 100 mg by mouth  at bedtime.  06/24/17  Yes [provider]    Family History Family History  Problem Relation Age of Onset  . Heart attack Mother 1  . Heart Problems Father   . Heart attack Father 65  . Cerebral aneurysm Sister 71  . Sudden death Brother 55    Social History Social History   Tobacco Use  . Smoking status: Former Smoker    Packs/day: 0.40    Years: 30.00    Pack years: 12.00    Types: Cigarettes    Last attempt to quit: 04/30/1960    Years since quitting: 57.4  . Smokeless tobacco: Never Used  Substance Use Topics  . Alcohol use: No  . Drug use: No     Allergies   Patient has no known allergies.   Review of Systems Review of Systems  Unable to perform ROS: Dementia     Physical Exam Updated Vital Signs BP (!) 171/96 (BP Location:  Right Arm)   Pulse 70   Temp 98.1 F (36.7 C) (Oral)   Resp 16   SpO2 96%   Physical Exam  Constitutional: She is oriented to person, place, and time. She appears well-developed and well-nourished.  HENT:  Head: Normocephalic and atraumatic.  Right Ear: External ear normal.  Left Ear: External ear normal.  Nose: Nose normal.  Eyes: Right eye exhibits no discharge. Left eye exhibits no discharge.  Cardiovascular: Normal rate and regular rhythm.  Murmur heard. Pulses:      Radial pulses are 2+ on the right side.       Dorsalis pedis pulses are 2+ on the right side.  Pulmonary/Chest: Effort normal and breath sounds normal. She exhibits no tenderness.  Abdominal: Soft. She exhibits no distension. There is no tenderness.  Musculoskeletal:       Right wrist: She exhibits decreased range of motion, tenderness, swelling, deformity and laceration (small laceration on ulnar wrist).       Right hip: She exhibits decreased range of motion and tenderness (lateral).       Right knee: No tenderness found.       Cervical back: She exhibits no tenderness.       Thoracic back: She exhibits no tenderness.       Lumbar back: She  exhibits no tenderness.       Right forearm: She exhibits no tenderness.       Right hand: She exhibits tenderness (mild). She exhibits normal range of motion, no deformity and no swelling.       Right upper leg: She exhibits no tenderness.  Neurological: She is alert and oriented to person, place, and time.  Skin: Skin is warm and dry.  Nursing note and vitals reviewed.    ED Treatments / Results  Labs (all labs ordered are listed, but only abnormal results are displayed) Labs Reviewed  CBC WITH DIFFERENTIAL/PLATELET - Abnormal; Notable for the following components:      Result Value   WBC 11.5 (*)    All other components within normal limits  COMPREHENSIVE METABOLIC PANEL - Abnormal; Notable for the following components:   Glucose, Bld 145 (*)    Creatinine, Ser 1.06 (*)    Calcium 8.7 (*)    Albumin 3.4 (*)    GFR calc non Af Amer 45 (*)    GFR calc Af Amer 52 (*)    All other components within normal limits  PROTIME-INR  URINALYSIS, ROUTINE W REFLEX MICROSCOPIC  TYPE AND SCREEN    EKG EKG Interpretation  Date/Time:  Thursday Sep 19 2017 11:45:22 EDT Ventricular Rate:  85 PR Interval:    QRS Duration: 132 QT Interval:  450 QTC Calculation: 536 R Axis:   -81 Text Interpretation:  Sinus rhythm Multiple ventricular premature complexes Left bundle branch block Confirmed by Pricilla Loveless [phone]) on 09/19/2017 11:49:47 AM   Radiology Dg Chest 1 View  Result Date: 09/19/2017 CLINICAL DATA:  Weakness. The patient suffered a right hip fracture due to a fall today. EXAM: CHEST  1 VIEW COMPARISON:  PA and lateral chest 11/12/2016. FINDINGS: The lungs are clear. Heart size is upper normal. Aortic atherosclerosis is identified. The patient is status post aortic valve repair. No pneumothorax or pleural effusion. No acute bony abnormality. IMPRESSION: No acute disease. Atherosclerosis. Electronically Signed   By: Drusilla Kanner M.D.   On: 09/19/2017 11:45   Dg Wrist Complete  Right  Result Date: 09/19/2017 CLINICAL DATA:  Fall.  Wrist injury.  EXAM: RIGHT WRIST - COMPLETE 3+ VIEW COMPARISON:  No recent prior. FINDINGS: Severely displaced comminuted distal right radial metaphyseal fracture is present. Displaced distal ulnar fracture is present. Prominent overlying of fracture fragments noted. Diffuse osteopenia and degenerative change. IMPRESSION: Severely displaced and comminuted distal right radial metaphyseal fracture. Displaced distal ulnar fracture is also present. Fracture fragments are overriding. Electronically Signed   By: Maisie Fus  Register   On: 09/19/2017 11:48   Dg Hand Complete Right  Result Date: 09/19/2017 CLINICAL DATA:  Fall.  Wrist injury. EXAM: RIGHT HAND - COMPLETE 3+ VIEW COMPARISON:  No prior. FINDINGS: Comminuted displaced overriding fractures are noted of the distal radius and ulna. Diffuse osteopenia degenerative change. No radiopaque foreign body. Peripheral vascular calcification. IMPRESSION: 1. Displaced comminuted angulated fractures of the distal radius and ulna. 2.  Peripheral vascular disease. Electronically Signed   By: Maisie Fus  Register   On: 09/19/2017 11:49   Dg Hip Unilat With Pelvis 2-3 Views Right  Result Date: 09/19/2017 CLINICAL DATA:  Right hip pain after fall. EXAM: DG HIP (WITH OR WITHOUT PELVIS) 2-3V RIGHT COMPARISON:  CT abdomen pelvis dated November 13, 2016. FINDINGS: Acute, displaced fracture through the right femoral neck. No dislocation. The left hip is unremarkable. The pubic symphysis and sacroiliac joints are intact. Osteopenia. Unchanged calcifications in the left pelvis associated with known large dermoid. IMPRESSION: 1. Acute, displaced right femoral neck fracture. Electronically Signed   By: Obie Dredge M.D.   On: 09/19/2017 11:47    Procedures Procedures (including critical care time)  Medications Ordered in ED Medications  HYDROmorphone (DILAUDID) injection 0.5 mg (0.5 mg Intravenous Given 09/19/17 1058)  ceFAZolin  (ANCEF) IVPB 1 g/50 mL premix (1 g Intravenous New Bag/Given 09/19/17 1255)  ondansetron (ZOFRAN) injection 4 mg (4 mg Intravenous Given 09/19/17 1058)     Initial Impression / Assessment and Plan / ED Course  I have reviewed the triage vital signs and the nursing notes.  Pertinent labs & imaging results that were available during my care of the patient were reviewed by me and considered in my medical decision making (see chart for details).     Patient presents after what appears to be a mechanical fall and right-sided injuries.  She did not hit her head or was seen to hit her head or lose consciousness.  She is found to have an open right wrist fracture with a small laceration but oozing bleeding.  Also found to have a hip fracture.  Discussed with Orthopedics, Earney Hamburg has seen patient and likely OR today/tomorrow. Keep NPO. Dr. Merlyn Lot consulted for open wrist fracture. Wound was irrigated, placed on ancef. TDAP up to date. He recommends splinting for comfort, will get with ortho for when to do surgery.  Admit to the hospitalist service.  Final Clinical Impressions(s) / ED Diagnoses   Final diagnoses:  Closed fracture of neck of right femur, initial encounter (HCC)  Radius/ulna fracture, right, open type I or II, initial encounter    ED Discharge Orders    None       Pricilla Loveless, MD 09/19/17 1601

## 2017-09-19 NOTE — ED Notes (Signed)
Pt back from x-ray.

## 2017-09-19 NOTE — Consult Note (Addendum)
Reason for Consult:Hip fx Referring Physician: S Goldston  Rebecca [name] is an 84 y.o. female.  HPI: Rebecca [name] was at the SNF where she resides and got tangled up with another resident and fell. She was brought to the ED for evaluation and x-rays showed a right wrist and right femoral neck fx. She is demented and not a reliable historian. History obtained from EDP.   Past Medical History:  Diagnosis Date  . Age-related macular degeneration, wet, right eye (HCC)   . Aortic stenosis   . Aortic stenosis   . Dementia    "slight"/daughter 12/02/2013  . Dementia with Lewy bodies   . Depression   . Heart block   . Heart murmur    "slight"  . High cholesterol   . Hypertension   . Panic attacks   . Paroxysmal atrial fibrillation (HCC)   . S/P TAVR (transcatheter aortic valve replacement) 02/02/2014   23 mm Edwards Sapien XT transcatheter heart valve placed via open right transfemoral approach  . Syncope 08/21/2012  . UTI (lower urinary tract infection)     Past Surgical History:  Procedure Laterality Date  . CARDIAC CATHETERIZATION  04/2013  . EYE SURGERY     cataracts  . INTRAOPERATIVE TRANSESOPHAGEAL ECHOCARDIOGRAM N/A 02/02/2014   Procedure: INTRAOPERATIVE TRANSESOPHAGEAL ECHOCARDIOGRAM;  Surgeon: Michael Cooper, MD;  Location: [facility] OR;  Service: Open Heart Surgery;  Laterality: N/A;  . LEFT AND RIGHT HEART CATHETERIZATION WITH CORONARY ANGIOGRAM N/A 05/25/2013   Procedure: LEFT AND RIGHT HEART CATHETERIZATION WITH CORONARY ANGIOGRAM;  Surgeon: [name] Jordan, MD;  Location: [facility] CATH LAB;  Service: Cardiovascular;  Laterality: N/A;  . TONSILLECTOMY    . TRANSCATHETER AORTIC VALVE REPLACEMENT, TRANSFEMORAL  02/02/2014   Edwards Sapien XT THV (size 23 mm, model # 9300TFX, serial # [id number])  . TRANSCATHETER AORTIC VALVE REPLACEMENT, TRANSFEMORAL N/A 02/02/2014   Procedure: TRANSCATHETER AORTIC VALVE REPLACEMENT, TRANSFEMORAL;  Surgeon: Michael Cooper, MD;  Location: [facility] OR;  Service: Open Heart  Surgery;  Laterality: N/A;    Family History  Problem Relation Age of Onset  . Heart attack Mother 33  . Heart Problems Father   . Heart attack Father 83  . Cerebral aneurysm Sister 42  . Sudden death Brother 65    Social History:  reports that she quit smoking about 57 years ago. Her smoking use included cigarettes. She has a 12.00 pack-year smoking history. She has never used smokeless tobacco. She reports that she does not drink alcohol or use drugs.  Allergies: No Known Allergies  Medications: I have reviewed the patient's current medications.  Results for orders placed or performed during the hospital encounter of 09/19/17 (from the past 48 hour(s))  CBC WITH DIFFERENTIAL     Status: Abnormal   Collection Time: 09/19/17 10:58 AM  Result Value Ref Range   WBC 11.5 (H) 4.0 - 10.5 K/uL   RBC 5.04 3.87 - 5.11 MIL/uL   Hemoglobin 13.6 12.0 - 15.0 g/dL   HCT 42.3 36.0 - 46.0 %   MCV 83.9 78.0 - 100.0 fL   MCH 27.0 26.0 - 34.0 pg   MCHC 32.2 30.0 - 36.0 g/dL   RDW 14.6 11.5 - 15.5 %   Platelets 217 150 - 400 K/uL   Neutrophils Relative % 65 %   Neutro Abs 7.5 1.7 - 7.7 K/uL   Lymphocytes Relative 27 %   Lymphs Abs 3.1 0.7 - 4.0 K/uL   Monocytes Relative 6 %   Monocytes Absolute 0.7 0.1 - 1.0   K/uL   Eosinophils Relative 1 %   Eosinophils Absolute 0.1 0.0 - 0.7 K/uL   Basophils Relative 0 %   Basophils Absolute 0.0 0.0 - 0.1 K/uL   Immature Granulocytes 1 %   Abs Immature Granulocytes 0.1 0.0 - 0.1 K/uL    Comment: Performed at [facility], [address] [place], [place] [address]  Type and screen [facility]     Status: None   Collection Time: 09/19/17 11:00 AM  Result Value Ref Range   ABO/RH(D) A NEG    Antibody Screen NEG    Sample Expiration      09/22/2017 Performed at  Hospital Lab, [address] [place], [place] [address]     Dg Chest 1 View  Result Date: 09/19/2017 CLINICAL DATA:  Weakness. The patient suffered a right hip  fracture due to a fall today. EXAM: CHEST  1 VIEW COMPARISON:  PA and lateral chest 11/12/2016. FINDINGS: The lungs are clear. Heart size is upper normal. Aortic atherosclerosis is identified. The patient is status post aortic valve repair. No pneumothorax or pleural effusion. No acute bony abnormality. IMPRESSION: No acute disease. Atherosclerosis. Electronically Signed   By: Thomas  Dalessio M.D.   On: 09/19/2017 11:45   Dg Wrist Complete Right  Result Date: 09/19/2017 CLINICAL DATA:  Fall.  Wrist injury. EXAM: RIGHT WRIST - COMPLETE 3+ VIEW COMPARISON:  No recent prior. FINDINGS: Severely displaced comminuted distal right radial metaphyseal fracture is present. Displaced distal ulnar fracture is present. Prominent overlying of fracture fragments noted. Diffuse osteopenia and degenerative change. IMPRESSION: Severely displaced and comminuted distal right radial metaphyseal fracture. Displaced distal ulnar fracture is also present. Fracture fragments are overriding. Electronically Signed   By: Thomas  Register   On: 09/19/2017 11:48   Dg Hand Complete Right  Result Date: 09/19/2017 CLINICAL DATA:  Fall.  Wrist injury. EXAM: RIGHT HAND - COMPLETE 3+ VIEW COMPARISON:  No prior. FINDINGS: Comminuted displaced overriding fractures are noted of the distal radius and ulna. Diffuse osteopenia degenerative change. No radiopaque foreign body. Peripheral vascular calcification. IMPRESSION: 1. Displaced comminuted angulated fractures of the distal radius and ulna. 2.  Peripheral vascular disease. Electronically Signed   By: Thomas  Register   On: 09/19/2017 11:49   Dg Hip Unilat With Pelvis 2-3 Views Right  Result Date: 09/19/2017 CLINICAL DATA:  Right hip pain after fall. EXAM: DG HIP (WITH OR WITHOUT PELVIS) 2-3V RIGHT COMPARISON:  CT abdomen pelvis dated November 13, 2016. FINDINGS: Acute, displaced fracture through the right femoral neck. No dislocation. The left hip is unremarkable. The pubic symphysis and  sacroiliac joints are intact. Osteopenia. Unchanged calcifications in the left pelvis associated with known large dermoid. IMPRESSION: 1. Acute, displaced right femoral neck fracture. Electronically Signed   By: William T Derry M.D.   On: 09/19/2017 11:47    Review of Systems  Unable to perform ROS: Dementia  Constitutional: Negative for weight loss.  HENT: Negative for ear discharge, ear pain, hearing loss and tinnitus.   Eyes: Negative for blurred vision, double vision, photophobia and pain.  Respiratory: Negative for cough, sputum production and shortness of breath.   Cardiovascular: Negative for chest pain.  Gastrointestinal: Negative for abdominal pain, nausea and vomiting.  Genitourinary: Negative for dysuria, flank pain, frequency and urgency.  Musculoskeletal: Negative for back pain, falls, joint pain, myalgias and neck pain.  Neurological: Negative for dizziness, tingling, sensory change, focal weakness, loss of consciousness and headaches.  Endo/Heme/Allergies: Does not bruise/bleed   easily.  Psychiatric/Behavioral: Negative for depression, memory loss and substance abuse. The patient is not nervous/anxious.    Blood pressure (!) 171/96, pulse 70, temperature 98.1 F (36.7 C), temperature source Oral, resp. rate 16, SpO2 96 %. Physical Exam  Constitutional: She appears well-developed and well-nourished. No distress.  HENT:  Head: Normocephalic and atraumatic.  Eyes: Conjunctivae are normal. Right eye exhibits no discharge. Left eye exhibits no discharge. No scleral icterus.  Neck: Normal range of motion.  Cardiovascular: Normal rate and regular rhythm.  Respiratory: Effort normal. No respiratory distress.  Musculoskeletal:  Right shoulder, elbow, wrist, digits- Small laceration ulnar wrist, mod TTP, swollen, deformed  Sens  Ax/R/M/U could not reliably assess  Mot   Ax/ R/ PIN/ M/ AIN/ U could not reliably assess  Rad 2+  RLE No traumatic wounds, ecchymosis, or rash  Mod TTP  hip  No knee or ankle effusion  Knee stable to varus/ valgus and anterior/posterior stress  Sens DPN, SPN, TN intact  Motor EHL, ext, flex, evers 5/5  DP 2+, PT 1+, No significant edema  Neurological: She is alert.  Skin: Skin is warm and dry. She is not diaphoretic.  Psychiatric: She has a normal mood and affect. Her behavior is normal.    Assessment/Plan: Fall Right open wrist fx -- Dr. Kuzma has been contacted Right femoral neck fx -- Will need hip hemi, likely by Dr. Swinteck, suspect tomorrow for timing. Bedrest and [place] traction for now. Multiple medical problems including afib on Eliquis, HTN, hyperlipidemia, dementia, and osteoporosis -- Admission by IM. Please hold Eliquis.  Spoke with daughter about tentative plan and answered questions.  Michael J. Jeffery, PA-C Orthopedic Surgery [phone] 09/19/2017, 12:26 PM   I examined the patient and took her history.  I agree with the note as documented above.  She has a right femoral neck fracture.  I discussed this with Dr. Swinteck.  He will plan to take her to surgery tomorrow for hemiarthroplasty.  Consent has been obtained from her power of attorney.  Preop orders have been entered by Dr. Swinteck.  N.p.o. after midnight.  Hold blood thinners. 

## 2017-09-19 NOTE — ED Notes (Signed)
cmet and pt inr redrawn

## 2017-09-19 NOTE — Progress Notes (Signed)
Orthopedic Tech Progress Note Patient Details:  Rebecca [name] March 02, 1926 [id number]  Ortho Devices Type of Ortho Device: Arm sling, Ace wrap, Sugartong splint Ortho Device/Splint Location: Trapeze bar Ortho Device/Splint Interventions: Application   Post Interventions Patient Tolerated: Well Instructions Provided: Care of device   Saul Fordyce 09/19/2017, 5:14 PM

## 2017-09-19 NOTE — H&P (View-Only) (Signed)
Reason for Consult:Hip fx Referring Physician: Veverly [name] is an 84 y.o. female.  HPI: Rebecca [name] was at the SNF where she resides and got tangled up with another resident and fell. She was brought to the ED for evaluation and x-rays showed a right wrist and right femoral neck fx. She is demented and not a reliable historian. History obtained from EDP.   Past Medical History:  Diagnosis Date  . Age-related macular degeneration, wet, right eye (HCC)   . Aortic stenosis   . Aortic stenosis   . Dementia    "slight"/daughter 12/02/2013  . Dementia with Lewy bodies   . Depression   . Heart block   . Heart murmur    "slight"  . High cholesterol   . Hypertension   . Panic attacks   . Paroxysmal atrial fibrillation (HCC)   . S/P TAVR (transcatheter aortic valve replacement) 02/02/2014   23 mm Edwards Sapien XT transcatheter heart valve placed via open right transfemoral approach  . Syncope 08/21/2012  . UTI (lower urinary tract infection)     Past Surgical History:  Procedure Laterality Date  . CARDIAC CATHETERIZATION  04/2013  . EYE SURGERY     cataracts  . INTRAOPERATIVE TRANSESOPHAGEAL ECHOCARDIOGRAM N/A 02/02/2014   Procedure: INTRAOPERATIVE TRANSESOPHAGEAL ECHOCARDIOGRAM;  Surgeon: Tonny Bollman, MD;  Location: [facility] OR;  Service: Open Heart Surgery;  Laterality: N/A;  . LEFT AND RIGHT HEART CATHETERIZATION WITH CORONARY ANGIOGRAM N/A 05/25/2013   Procedure: LEFT AND RIGHT HEART CATHETERIZATION WITH CORONARY ANGIOGRAM;  Surgeon: [name] [name], MD;  Location: [facility] CATH LAB;  Service: Cardiovascular;  Laterality: N/A;  . TONSILLECTOMY    . TRANSCATHETER AORTIC VALVE REPLACEMENT, TRANSFEMORAL  02/02/2014   Edwards Sapien XT THV (size 23 mm, model # 9300TFX, serial # [id number])  . TRANSCATHETER AORTIC VALVE REPLACEMENT, TRANSFEMORAL N/A 02/02/2014   Procedure: TRANSCATHETER AORTIC VALVE REPLACEMENT, TRANSFEMORAL;  Surgeon: Tonny Bollman, MD;  Location: [facility] OR;  Service: Open Heart  Surgery;  Laterality: N/A;    Family History  Problem Relation Age of Onset  . Heart attack Mother 77  . Heart Problems Father   . Heart attack Father 11  . Cerebral aneurysm Sister 72  . Sudden death Brother 78    Social History:  reports that she quit smoking about 57 years ago. Her smoking use included cigarettes. She has a 12.00 pack-year smoking history. She has never used smokeless tobacco. She reports that she does not drink alcohol or use drugs.  Allergies: No Known Allergies  Medications: I have reviewed the patient's current medications.  Results for orders placed or performed during the hospital encounter of 09/19/17 (from the past 48 hour(s))  CBC WITH DIFFERENTIAL     Status: Abnormal   Collection Time: 09/19/17 10:58 AM  Result Value Ref Range   WBC 11.5 (H) 4.0 - 10.5 K/uL   RBC 5.04 3.87 - 5.11 MIL/uL   Hemoglobin 13.6 12.0 - 15.0 g/dL   HCT 30.8 65.7 - 84.6 %   MCV 83.9 78.0 - 100.0 fL   MCH 27.0 26.0 - 34.0 pg   MCHC 32.2 30.0 - 36.0 g/dL   RDW 96.2 95.2 - 84.1 %   Platelets 217 150 - 400 K/uL   Neutrophils Relative % 65 %   Neutro Abs 7.5 1.7 - 7.7 K/uL   Lymphocytes Relative 27 %   Lymphs Abs 3.1 0.7 - 4.0 K/uL   Monocytes Relative 6 %   Monocytes Absolute 0.7 0.1 - 1.0  K/uL   Eosinophils Relative 1 %   Eosinophils Absolute 0.1 0.0 - 0.7 K/uL   Basophils Relative 0 %   Basophils Absolute 0.0 0.0 - 0.1 K/uL   Immature Granulocytes 1 %   Abs Immature Granulocytes 0.1 0.0 - 0.1 K/uL    Comment: Performed at [facility], [address] [place], [place] [address]  Type and screen [facility]     Status: None   Collection Time: 09/19/17 11:00 AM  Result Value Ref Range   ABO/RH(D) A NEG    Antibody Screen NEG    Sample Expiration      09/22/2017 Performed at [facility], [address] [place], [place] [address]     Dg Chest 1 View  Result Date: 09/19/2017 CLINICAL DATA:  Weakness. The patient suffered a right hip  fracture due to a fall today. EXAM: CHEST  1 VIEW COMPARISON:  PA and lateral chest 11/12/2016. FINDINGS: The lungs are clear. Heart size is upper normal. Aortic atherosclerosis is identified. The patient is status post aortic valve repair. No pneumothorax or pleural effusion. No acute bony abnormality. IMPRESSION: No acute disease. Atherosclerosis. Electronically Signed   By: Drusilla Kanner M.D.   On: 09/19/2017 11:45   Dg Wrist Complete Right  Result Date: 09/19/2017 CLINICAL DATA:  Fall.  Wrist injury. EXAM: RIGHT WRIST - COMPLETE 3+ VIEW COMPARISON:  No recent prior. FINDINGS: Severely displaced comminuted distal right radial metaphyseal fracture is present. Displaced distal ulnar fracture is present. Prominent overlying of fracture fragments noted. Diffuse osteopenia and degenerative change. IMPRESSION: Severely displaced and comminuted distal right radial metaphyseal fracture. Displaced distal ulnar fracture is also present. Fracture fragments are overriding. Electronically Signed   By: Maisie Fus  Register   On: 09/19/2017 11:48   Dg Hand Complete Right  Result Date: 09/19/2017 CLINICAL DATA:  Fall.  Wrist injury. EXAM: RIGHT HAND - COMPLETE 3+ VIEW COMPARISON:  No prior. FINDINGS: Comminuted displaced overriding fractures are noted of the distal radius and ulna. Diffuse osteopenia degenerative change. No radiopaque foreign body. Peripheral vascular calcification. IMPRESSION: 1. Displaced comminuted angulated fractures of the distal radius and ulna. 2.  Peripheral vascular disease. Electronically Signed   By: Maisie Fus  Register   On: 09/19/2017 11:49   Dg Hip Unilat With Pelvis 2-3 Views Right  Result Date: 09/19/2017 CLINICAL DATA:  Right hip pain after fall. EXAM: DG HIP (WITH OR WITHOUT PELVIS) 2-3V RIGHT COMPARISON:  CT abdomen pelvis dated November 13, 2016. FINDINGS: Acute, displaced fracture through the right femoral neck. No dislocation. The left hip is unremarkable. The pubic symphysis and  sacroiliac joints are intact. Osteopenia. Unchanged calcifications in the left pelvis associated with known large dermoid. IMPRESSION: 1. Acute, displaced right femoral neck fracture. Electronically Signed   By: Obie Dredge M.D.   On: 09/19/2017 11:47    Review of Systems  Unable to perform ROS: Dementia  Constitutional: Negative for weight loss.  HENT: Negative for ear discharge, ear pain, hearing loss and tinnitus.   Eyes: Negative for blurred vision, double vision, photophobia and pain.  Respiratory: Negative for cough, sputum production and shortness of breath.   Cardiovascular: Negative for chest pain.  Gastrointestinal: Negative for abdominal pain, nausea and vomiting.  Genitourinary: Negative for dysuria, flank pain, frequency and urgency.  Musculoskeletal: Negative for back pain, falls, joint pain, myalgias and neck pain.  Neurological: Negative for dizziness, tingling, sensory change, focal weakness, loss of consciousness and headaches.  Endo/Heme/Allergies: Does not bruise/bleed  easily.  Psychiatric/Behavioral: Negative for depression, memory loss and substance abuse. The patient is not nervous/anxious.    Blood pressure (!) 171/96, pulse 70, temperature 98.1 F (36.7 C), temperature source Oral, resp. rate 16, SpO2 96 %. Physical Exam  Constitutional: She appears well-developed and well-nourished. No distress.  HENT:  Head: Normocephalic and atraumatic.  Eyes: Conjunctivae are normal. Right eye exhibits no discharge. Left eye exhibits no discharge. No scleral icterus.  Neck: Normal range of motion.  Cardiovascular: Normal rate and regular rhythm.  Respiratory: Effort normal. No respiratory distress.  Musculoskeletal:  Right shoulder, elbow, wrist, digits- Small laceration ulnar wrist, mod TTP, swollen, deformed  Sens  Ax/R/M/U could not reliably assess  Mot   Ax/ R/ PIN/ M/ AIN/ U could not reliably assess  Rad 2+  RLE No traumatic wounds, ecchymosis, or rash  Mod TTP  hip  No knee or ankle effusion  Knee stable to varus/ valgus and anterior/posterior stress  Sens DPN, SPN, TN intact  Motor EHL, ext, flex, evers 5/5  DP 2+, PT 1+, No significant edema  Neurological: She is alert.  Skin: Skin is warm and dry. She is not diaphoretic.  Psychiatric: She has a normal mood and affect. Her behavior is normal.    Assessment/Plan: Fall Right open wrist fx -- Dr. Merlyn Lot has been contacted Right femoral neck fx -- Will need hip hemi, likely by Dr. Linna Caprice, suspect tomorrow for timing. Bedrest and Bucks traction for now. Multiple medical problems including afib on Eliquis, HTN, hyperlipidemia, dementia, and osteoporosis -- Admission by IM. Please hold Eliquis.  Spoke with daughter about tentative plan and answered questions.  Freeman Caldron, PA-C Orthopedic Surgery [phone] 09/19/2017, 12:26 PM   I examined the patient and took her history.  I agree with the note as documented above.  She has a right femoral neck fracture.  I discussed this with Dr. Linna Caprice.  He will plan to take her to surgery tomorrow for hemiarthroplasty.  Consent has been obtained from her power of attorney.  Preop orders have been entered by Dr. Linna Caprice.  N.p.o. after midnight.  Hold blood thinners.

## 2017-09-20 ENCOUNTER — Inpatient Hospital Stay (HOSPITAL_COMMUNITY): Payer: Medicare Other

## 2017-09-20 ENCOUNTER — Encounter (HOSPITAL_COMMUNITY)
Admission: EM | Disposition: A | Discharge status: Skilled Nursing Facility | Payer: Self-pay | Source: Home / Self Care | Attending: Internal Medicine

## 2017-09-20 ENCOUNTER — Inpatient Hospital Stay (HOSPITAL_COMMUNITY): Payer: Medicare Other | Admitting: Anesthesiology

## 2017-09-20 DIAGNOSIS — S72001A Fracture of unspecified part of neck of right femur, initial encounter for closed fracture: Secondary | ICD-10-CM | POA: Diagnosis present

## 2017-09-20 DIAGNOSIS — I480 Paroxysmal atrial fibrillation: Secondary | ICD-10-CM

## 2017-09-20 DIAGNOSIS — S52551A Other extraarticular fracture of lower end of right radius, initial encounter for closed fracture: Secondary | ICD-10-CM | POA: Diagnosis not present

## 2017-09-20 DIAGNOSIS — S52691B Other fracture of lower end of right ulna, initial encounter for open fracture type I or II: Secondary | ICD-10-CM | POA: Diagnosis not present

## 2017-09-20 DIAGNOSIS — I10 Essential (primary) hypertension: Secondary | ICD-10-CM

## 2017-09-20 DIAGNOSIS — I350 Nonrheumatic aortic (valve) stenosis: Secondary | ICD-10-CM

## 2017-09-20 HISTORY — PX: OPEN REDUCTION INTERNAL FIXATION (ORIF) DISTAL RADIAL FRACTURE: SHX5989

## 2017-09-20 HISTORY — PX: I&D EXTREMITY: SHX5045

## 2017-09-20 HISTORY — PX: HIP ARTHROPLASTY: SHX981

## 2017-09-20 LAB — CBC
HEMATOCRIT: 39.3 % (ref 36.0–46.0)
HEMOGLOBIN: 12.5 g/dL (ref 12.0–15.0)
MCH: 26.9 pg (ref 26.0–34.0)
MCHC: 31.8 g/dL (ref 30.0–36.0)
MCV: 84.5 fL (ref 78.0–100.0)
Platelets: 166 10*3/uL (ref 150–400)
RBC: 4.65 MIL/uL (ref 3.87–5.11)
RDW: 14.9 % (ref 11.5–15.5)
WBC: 11 10*3/uL — ABNORMAL HIGH (ref 4.0–10.5)

## 2017-09-20 LAB — BASIC METABOLIC PANEL
Anion gap: 10 (ref 5–15)
BUN: 9 mg/dL (ref 6–20)
CHLORIDE: 107 mmol/L (ref 101–111)
CO2: 25 mmol/L (ref 22–32)
Calcium: 8.4 mg/dL — ABNORMAL LOW (ref 8.9–10.3)
Creatinine, Ser: 0.78 mg/dL (ref 0.44–1.00)
GFR calc Af Amer: >60 mL/min (ref >60)
GFR calc non Af Amer: >60 mL/min (ref >60)
GLUCOSE: 122 mg/dL — AB (ref 65–99)
Potassium: 3.5 mmol/L (ref 3.5–5.1)
SODIUM: 142 mmol/L (ref 135–145)

## 2017-09-20 SURGERY — HEMIARTHROPLASTY, HIP, DIRECT ANTERIOR APPROACH, FOR FRACTURE
Anesthesia: General | Site: Hip | Laterality: Right

## 2017-09-20 MED ORDER — ONDANSETRON HCL 4 MG/2ML IJ SOLN: 4.0000 mg | Freq: Four times a day (QID) | INTRAMUSCULAR | Status: DC | PRN

## 2017-09-20 MED ORDER — CEFAZOLIN SODIUM-DEXTROSE 2-4 GM/100ML-% IV SOLN
2.0000 g | Freq: Four times a day (QID) | INTRAVENOUS | Status: AC
  Administered 2017-09-21: 2 g via INTRAVENOUS
  Filled 2017-09-20 (×2): qty 100

## 2017-09-20 MED ORDER — PHENYLEPHRINE 40 MCG/ML (10ML) SYRINGE FOR IV PUSH (FOR BLOOD PRESSURE SUPPORT)
PREFILLED_SYRINGE | INTRAVENOUS | Status: DC | PRN
  Administered 2017-09-20: 120 ug via INTRAVENOUS
  Administered 2017-09-20: 80 ug via INTRAVENOUS

## 2017-09-20 MED ORDER — DOCUSATE SODIUM 100 MG PO CAPS
100.0000 mg | ORAL_CAPSULE | Freq: Two times a day (BID) | ORAL | Status: DC
  Administered 2017-09-21: 100 mg via ORAL
  Filled 2017-09-20 (×2): qty 1

## 2017-09-20 MED ORDER — PHENOL 1.4 % MT LIQD: 1.0000 | OROMUCOSAL | Status: DC | PRN

## 2017-09-20 MED ORDER — 0.9 % SODIUM CHLORIDE (POUR BTL) OPTIME
TOPICAL | Status: DC | PRN
  Administered 2017-09-20 (×2): 1000 mL

## 2017-09-20 MED ORDER — APIXABAN 2.5 MG PO TABS
2.5000 mg | ORAL_TABLET | Freq: Two times a day (BID) | ORAL | Status: DC
  Administered 2017-09-21 – 2017-09-25 (×8): 2.5 mg via ORAL
  Filled 2017-09-20 (×8): qty 1

## 2017-09-20 MED ORDER — ROCURONIUM BROMIDE 10 MG/ML (PF) SYRINGE
PREFILLED_SYRINGE | INTRAVENOUS | Status: AC
  Filled 2017-09-20: qty 5

## 2017-09-20 MED ORDER — LIDOCAINE 2% (20 MG/ML) 5 ML SYRINGE
INTRAMUSCULAR | Status: AC
  Filled 2017-09-20: qty 5

## 2017-09-20 MED ORDER — METOCLOPRAMIDE HCL 5 MG PO TABS: 5.0000 mg | ORAL_TABLET | Freq: Three times a day (TID) | ORAL | Status: DC | PRN

## 2017-09-20 MED ORDER — ROCURONIUM BROMIDE 100 MG/10ML IV SOLN
INTRAVENOUS | Status: DC | PRN
  Administered 2017-09-20: 50 mg via INTRAVENOUS

## 2017-09-20 MED ORDER — PHENYLEPHRINE HCL 10 MG/ML IJ SOLN
INTRAVENOUS | Status: DC | PRN
  Administered 2017-09-20: 50 ug/min via INTRAVENOUS

## 2017-09-20 MED ORDER — BUPIVACAINE-EPINEPHRINE (PF) 0.5% -1:200000 IJ SOLN
INTRAMUSCULAR | Status: DC | PRN
  Administered 2017-09-20: 30 mL

## 2017-09-20 MED ORDER — SUGAMMADEX SODIUM 200 MG/2ML IV SOLN
INTRAVENOUS | Status: DC | PRN
  Administered 2017-09-20: 100 mg via INTRAVENOUS

## 2017-09-20 MED ORDER — CHLORHEXIDINE GLUCONATE 4 % EX LIQD
60.0000 mL | Freq: Once | CUTANEOUS | Status: AC
  Administered 2017-09-20: 4 via TOPICAL

## 2017-09-20 MED ORDER — CEFAZOLIN SODIUM-DEXTROSE 2-4 GM/100ML-% IV SOLN: 2.0000 g | INTRAVENOUS | Status: DC

## 2017-09-20 MED ORDER — FENTANYL CITRATE (PF) 250 MCG/5ML IJ SOLN
INTRAMUSCULAR | Status: AC
  Filled 2017-09-20: qty 5

## 2017-09-20 MED ORDER — FENTANYL CITRATE (PF) 100 MCG/2ML IJ SOLN: 25.0000 ug | INTRAMUSCULAR | Status: DC | PRN

## 2017-09-20 MED ORDER — ONDANSETRON HCL 4 MG/2ML IJ SOLN
INTRAMUSCULAR | Status: AC
  Filled 2017-09-20: qty 2

## 2017-09-20 MED ORDER — BUPIVACAINE-EPINEPHRINE (PF) 0.5% -1:200000 IJ SOLN
INTRAMUSCULAR | Status: AC
  Filled 2017-09-20: qty 30

## 2017-09-20 MED ORDER — SUGAMMADEX SODIUM 200 MG/2ML IV SOLN
INTRAVENOUS | Status: AC
  Filled 2017-09-20: qty 2

## 2017-09-20 MED ORDER — FENTANYL CITRATE (PF) 100 MCG/2ML IJ SOLN
INTRAMUSCULAR | Status: DC | PRN
  Administered 2017-09-20 (×3): 25 ug via INTRAVENOUS
  Administered 2017-09-20 (×2): 50 ug via INTRAVENOUS
  Administered 2017-09-20: 25 ug via INTRAVENOUS

## 2017-09-20 MED ORDER — EPHEDRINE SULFATE-NACL 50-0.9 MG/10ML-% IV SOSY
PREFILLED_SYRINGE | INTRAVENOUS | Status: DC | PRN
  Administered 2017-09-20: 10 mg via INTRAVENOUS

## 2017-09-20 MED ORDER — PHENYLEPHRINE HCL 10 MG/ML IJ SOLN
INTRAMUSCULAR | Status: AC
  Filled 2017-09-20: qty 1

## 2017-09-20 MED ORDER — PROPOFOL 1000 MG/100ML IV EMUL
INTRAVENOUS | Status: AC
  Filled 2017-09-20: qty 100

## 2017-09-20 MED ORDER — LACTATED RINGERS IV SOLN
INTRAVENOUS | Status: DC
  Administered 2017-09-20: 12:00:00 via INTRAVENOUS

## 2017-09-20 MED ORDER — SODIUM CHLORIDE 0.9 % IR SOLN
Status: DC | PRN
  Administered 2017-09-20: 3000 mL

## 2017-09-20 MED ORDER — SODIUM CHLORIDE 0.9 % IV SOLN
INTRAVENOUS | Status: AC | PRN
  Administered 2017-09-20: 1000 mL via INTRAMUSCULAR

## 2017-09-20 MED ORDER — DEXAMETHASONE SODIUM PHOSPHATE 10 MG/ML IJ SOLN
INTRAMUSCULAR | Status: DC | PRN
  Administered 2017-09-20: 5 mg via INTRAVENOUS

## 2017-09-20 MED ORDER — ONDANSETRON HCL 4 MG/2ML IJ SOLN
INTRAMUSCULAR | Status: DC | PRN
  Administered 2017-09-20: 4 mg via INTRAVENOUS

## 2017-09-20 MED ORDER — MENTHOL 3 MG MT LOZG: 1.0000 | LOZENGE | OROMUCOSAL | Status: DC | PRN

## 2017-09-20 MED ORDER — ONDANSETRON HCL 4 MG PO TABS: 4.0000 mg | ORAL_TABLET | Freq: Four times a day (QID) | ORAL | Status: DC | PRN

## 2017-09-20 MED ORDER — LIDOCAINE HCL (CARDIAC) PF 100 MG/5ML IV SOSY
PREFILLED_SYRINGE | INTRAVENOUS | Status: DC | PRN
  Administered 2017-09-20: 60 mg via INTRAVENOUS

## 2017-09-20 MED ORDER — PROPOFOL 10 MG/ML IV BOLUS
INTRAVENOUS | Status: DC | PRN
  Administered 2017-09-20: 50 mg via INTRAVENOUS

## 2017-09-20 MED ORDER — METOCLOPRAMIDE HCL 5 MG/ML IJ SOLN: 5.0000 mg | Freq: Three times a day (TID) | INTRAMUSCULAR | Status: DC | PRN

## 2017-09-20 MED ORDER — SODIUM CHLORIDE 0.9 % IJ SOLN
INTRAMUSCULAR | Status: DC | PRN
  Administered 2017-09-20: 30 mL via INTRAVENOUS

## 2017-09-20 SURGICAL SUPPLY — 125 items
BANDAGE ACE 3X5.8 VEL STRL LF (GAUZE/BANDAGES/DRESSINGS) ×4 IMPLANT
BANDAGE ACE 4X5 VEL STRL LF (GAUZE/BANDAGES/DRESSINGS) ×4 IMPLANT
BANDAGE COBAN STERILE 2 (GAUZE/BANDAGES/DRESSINGS) IMPLANT
BIT DRILL 2.0 LNG QUCK RELEASE (BIT) IMPLANT
BIT DRILL 2.8X5 QR DISP (BIT) ×2 IMPLANT
BLADE CLIPPER SURG (BLADE) IMPLANT
BLADE SAW SGTL 18X1.27X75 (BLADE) ×3 IMPLANT
BLADE SAW SGTL 18X1.27X75MM (BLADE) ×1
BNDG ESMARK 4X9 LF (GAUZE/BANDAGES/DRESSINGS) ×4 IMPLANT
BNDG GAUZE ELAST 4 BULKY (GAUZE/BANDAGES/DRESSINGS) ×4 IMPLANT
CAPT HIP HEMI 2 ×2 IMPLANT
CHLORAPREP W/TINT 26ML (MISCELLANEOUS) ×4 IMPLANT
CORDS BIPOLAR (ELECTRODE) ×4 IMPLANT
COVER SURGICAL LIGHT HANDLE (MISCELLANEOUS) ×8 IMPLANT
CUFF TOURNIQUET SINGLE 18IN (TOURNIQUET CUFF) ×4 IMPLANT
CUFF TOURNIQUET SINGLE 24IN (TOURNIQUET CUFF) IMPLANT
DECANTER SPIKE VIAL GLASS SM (MISCELLANEOUS) ×4 IMPLANT
DERMABOND ADHESIVE PROPEN (GAUZE/BANDAGES/DRESSINGS) ×2
DERMABOND ADVANCED (GAUZE/BANDAGES/DRESSINGS) ×4
DERMABOND ADVANCED .7 DNX12 (GAUZE/BANDAGES/DRESSINGS) ×4 IMPLANT
DERMABOND ADVANCED .7 DNX6 (GAUZE/BANDAGES/DRESSINGS) IMPLANT
DRAIN PENROSE 1/4X12 LTX STRL (WOUND CARE) IMPLANT
DRAIN TLS ROUND 10FR (DRAIN) IMPLANT
DRAPE C-ARM 42X72 X-RAY (DRAPES) ×4 IMPLANT
DRAPE IMP U-DRAPE 54X76 (DRAPES) ×8 IMPLANT
DRAPE OEC MINIVIEW 54X84 (DRAPES) ×2 IMPLANT
DRAPE STERI IOBAN 125X83 (DRAPES) ×4 IMPLANT
DRAPE SURG 17X23 STRL (DRAPES) ×4 IMPLANT
DRAPE U-SHAPE 47X51 STRL (DRAPES) ×12 IMPLANT
DRILL 2.0 LNG QUICK RELEASE (BIT) ×4
DRSG AQUACEL AG ADV 3.5X10 (GAUZE/BANDAGES/DRESSINGS) ×4 IMPLANT
DRSG PAD ABDOMINAL 8X10 ST (GAUZE/BANDAGES/DRESSINGS) ×8 IMPLANT
ELECT BLADE 4.0 EZ CLEAN MEGAD (MISCELLANEOUS) ×4
ELECT REM PT RETURN 9FT ADLT (ELECTROSURGICAL) ×4
ELECTRODE BLDE 4.0 EZ CLN MEGD (MISCELLANEOUS) ×2 IMPLANT
ELECTRODE REM PT RTRN 9FT ADLT (ELECTROSURGICAL) ×2 IMPLANT
EVACUATOR 1/8 PVC DRAIN (DRAIN) IMPLANT
GAUZE SPONGE 4X4 12PLY STRL (GAUZE/BANDAGES/DRESSINGS) ×4 IMPLANT
GAUZE XEROFORM 1X8 LF (GAUZE/BANDAGES/DRESSINGS) ×4 IMPLANT
GLOVE BIO SURGEON STRL SZ7.5 (GLOVE) ×4 IMPLANT
GLOVE BIO SURGEON STRL SZ8.5 (GLOVE) ×8 IMPLANT
GLOVE BIOGEL PI IND STRL 8 (GLOVE) ×2 IMPLANT
GLOVE BIOGEL PI IND STRL 8.5 (GLOVE) ×2 IMPLANT
GLOVE BIOGEL PI INDICATOR 8 (GLOVE) ×2
GLOVE BIOGEL PI INDICATOR 8.5 (GLOVE) ×2
GOWN STRL REUS W/ TWL LRG LVL3 (GOWN DISPOSABLE) ×10 IMPLANT
GOWN STRL REUS W/ TWL XL LVL3 (GOWN DISPOSABLE) ×6 IMPLANT
GOWN STRL REUS W/TWL 2XL LVL3 (GOWN DISPOSABLE) ×6 IMPLANT
GOWN STRL REUS W/TWL LRG LVL3 (GOWN DISPOSABLE) ×14
GOWN STRL REUS W/TWL XL LVL3 (GOWN DISPOSABLE) ×6
GUIDEWIRE ORTHO 0.054X6 (WIRE) ×6 IMPLANT
HANDPIECE INTERPULSE COAX TIP (DISPOSABLE) ×2
HOOD PEEL AWAY FACE SHEILD DIS (HOOD) ×8 IMPLANT
KIT BASIN OR (CUSTOM PROCEDURE TRAY) ×8 IMPLANT
KIT TURNOVER KIT B (KITS) ×8 IMPLANT
LOOP VESSEL MAXI BLUE (MISCELLANEOUS) IMPLANT
MANIFOLD NEPTUNE II (INSTRUMENTS) ×8 IMPLANT
MARKER SKIN DUAL TIP RULER LAB (MISCELLANEOUS) ×6 IMPLANT
NDL HYPO 25X1 1.5 SAFETY (NEEDLE) IMPLANT
NDL SPNL 18GX3.5 QUINCKE PK (NEEDLE) ×2 IMPLANT
NEEDLE 22X1 1/2 (OR ONLY) (NEEDLE) IMPLANT
NEEDLE HYPO 25X1 1.5 SAFETY (NEEDLE) IMPLANT
NEEDLE SPNL 18GX3.5 QUINCKE PK (NEEDLE) ×4 IMPLANT
NS IRRIG 1000ML POUR BTL (IV SOLUTION) ×8 IMPLANT
PACK ORTHO EXTREMITY (CUSTOM PROCEDURE TRAY) ×4 IMPLANT
PACK TOTAL JOINT (CUSTOM PROCEDURE TRAY) ×4 IMPLANT
PACK UNIVERSAL I (CUSTOM PROCEDURE TRAY) ×4 IMPLANT
PAD ARMBOARD 7.5X6 YLW CONV (MISCELLANEOUS) ×16 IMPLANT
PAD CAST 3X4 CTTN HI CHSV (CAST SUPPLIES) IMPLANT
PAD CAST 4YDX4 CTTN HI CHSV (CAST SUPPLIES) ×2 IMPLANT
PADDING CAST COTTON 3X4 STRL (CAST SUPPLIES) ×2
PADDING CAST COTTON 4X4 STRL (CAST SUPPLIES) ×2
PLATE R LONG NARROW PROX (Plate) ×2 IMPLANT
SCREW ACTK 2 NL HEX 3.5.11 (Screw) ×2 IMPLANT
SCREW BN FT 16X2.3XLCK HEX CRT (Screw) IMPLANT
SCREW BONE HEX N/L 3.5X9 (Screw) IMPLANT
SCREW BONE HEX N/L 3.5X9MM (Screw) ×4 IMPLANT
SCREW CORT FT 18X2.3XLCK HEX (Screw) IMPLANT
SCREW CORT FX14X2.3XLCK NS (Screw) IMPLANT
SCREW CORTICAL LOCKING 2.3X14M (Screw) ×2 IMPLANT
SCREW CORTICAL LOCKING 2.3X16M (Screw) ×6 IMPLANT
SCREW CORTICAL LOCKING 2.3X18M (Screw) ×2 IMPLANT
SCREW CORTICAL LOCKING 2.3X20M (Screw) ×2 IMPLANT
SCREW FX16X2.3XLCK SMTH NS CRT (Screw) IMPLANT
SCREW FX20X2.3XSMTH LCK NS CRT (Screw) IMPLANT
SCREW NON LOCK 3.5X10MM (Screw) ×6 IMPLANT
SCREW NONLOCK HEX 3.5X12 (Screw) ×2 IMPLANT
SCRUB BETADINE 4OZ XXX (MISCELLANEOUS) ×6 IMPLANT
SEALER BIPOLAR AQUA 6.0 (INSTRUMENTS) ×2 IMPLANT
SET CYSTO W/LG BORE CLAMP LF (SET/KITS/TRAYS/PACK) IMPLANT
SET HNDPC FAN SPRY TIP SCT (DISPOSABLE) ×2 IMPLANT
SOL PREP POV-IOD 4OZ 10% (MISCELLANEOUS) ×4 IMPLANT
SOLUTION BETADINE 4OZ (MISCELLANEOUS) ×2 IMPLANT
SPONGE LAP 4X18 X RAY DECT (DISPOSABLE) ×4 IMPLANT
SUCTION FRAZIER HANDLE 10FR (MISCELLANEOUS) ×2
SUCTION TUBE FRAZIER 10FR DISP (MISCELLANEOUS) ×2 IMPLANT
SUT ETHIBOND NAB CT1 #1 30IN (SUTURE) ×8 IMPLANT
SUT ETHILON 4 0 P 3 18 (SUTURE) IMPLANT
SUT ETHILON 4 0 PS 2 18 (SUTURE) ×2 IMPLANT
SUT MNCRL AB 3-0 PS2 18 (SUTURE) ×4 IMPLANT
SUT MNCRL AB 4-0 PS2 18 (SUTURE) ×4 IMPLANT
SUT MON AB 2-0 CT1 36 (SUTURE) ×4 IMPLANT
SUT MON AB 5-0 P3 18 (SUTURE) IMPLANT
SUT PROLENE 3 0 PS 2 (SUTURE) IMPLANT
SUT VIC AB 1 CT1 27 (SUTURE) ×2
SUT VIC AB 1 CT1 27XBRD ANBCTR (SUTURE) ×2 IMPLANT
SUT VIC AB 2-0 CT1 27 (SUTURE) ×2
SUT VIC AB 2-0 CT1 TAPERPNT 27 (SUTURE) ×2 IMPLANT
SUT VIC AB 3-0 FS2 27 (SUTURE) IMPLANT
SUT VLOC 180 0 24IN GS25 (SUTURE) ×4 IMPLANT
SWAB COLLECTION DEVICE MRSA (MISCELLANEOUS) IMPLANT
SWAB CULTURE ESWAB REG 1ML (MISCELLANEOUS) IMPLANT
SYR 50ML LL SCALE MARK (SYRINGE) ×4 IMPLANT
SYR CONTROL 10ML LL (SYRINGE) IMPLANT
SYSTEM CHEST DRAIN TLS 7FR (DRAIN) IMPLANT
TOWEL OR 17X24 6PK STRL BLUE (TOWEL DISPOSABLE) ×8 IMPLANT
TOWEL OR 17X26 10 PK STRL BLUE (TOWEL DISPOSABLE) ×8 IMPLANT
TRAY FOLEY CATH SILVER 16FR (SET/KITS/TRAYS/PACK) ×2 IMPLANT
TUBE CONNECTING 12'X1/4 (SUCTIONS) ×1
TUBE CONNECTING 12X1/4 (SUCTIONS) ×3 IMPLANT
TUBE EVACUATION TLS (MISCELLANEOUS) ×4 IMPLANT
TUBE FEEDING ENTERAL 5FR 16IN (TUBING) IMPLANT
UNDERPAD 30X30 (UNDERPADS AND DIAPERS) ×4 IMPLANT
WATER STERILE IRR 1000ML POUR (IV SOLUTION) ×16 IMPLANT
YANKAUER SUCT BULB TIP NO VENT (SUCTIONS) ×4 IMPLANT

## 2017-09-20 NOTE — Transfer of Care (Signed)
Immediate Anesthesia Transfer of Care Note  Patient: Rebecca [name]  Procedure(s) Performed: RIGHT ARTHROPLASTY BIPOLAR HIP (HEMIARTHROPLASTY) (Right Hip) OPEN REDUCTION INTERNAL FIXATION (ORIF) DISTAL RADIAL FRACTURE, possible Ulna Fracture ORIF (Right ) IRRIGATION AND DEBRIDEMENT Open Wrist Fracture (Right )  Patient Location: PACU  Anesthesia Type:General  Level of Consciousness: awake and alert   Airway & Oxygen Therapy: Patient Spontanous Breathing and Patient connected to nasal cannula oxygen  Post-op Assessment: Report given to RN and Post -op Vital signs reviewed and stable  Post vital signs: Reviewed and stable  Last Vitals:  Vitals Value Taken Time  BP    Temp 36.5 C 09/20/2017  5:03 PM  Pulse    Resp    SpO2      Last Pain:  Vitals:   09/20/17 1141  TempSrc: Oral  PainSc:          Complications: No apparent anesthesia complications

## 2017-09-20 NOTE — Progress Notes (Addendum)
PROGRESS NOTE    Rebecca [name]  ZOX:[id number] DOB: 1925/11/09 DOA: 09/19/2017 PCP: Daisy Floro, MD   Brief Narrative:  Rebecca [name] is a pleasantly demented 84 y.o. female with medical history significant PAF on Eliquis, hypertension, dementia, aortic stenosis s/p TAVR presented to the emergency department from facility after a mechanical fall resulting in right femoral neck fracture and right open wrist fracture. Orrthopedic surgery consulted patient placed in bucks traction overnight  and plans to take patient later today for right hip hemiarthroplasty and right wrist ORIF on 5/24.  Assessment & Plan: Closed right hip fracture and right distal radius and ulna fractures secondary to mechanical fall: Orthopedics and hand surgery following-for surgical repair later today.  Continue supportive care  PAF: Rate controlled without the use of any rate control medications-Eliquis on hold-resume okay with orthopedics  Severe AS-status post TAVR   Chronic diastolic heart failure: Compensated  Hypertension:Blood pressure on the high end of normal-possible related to pain. Continue amlodipine and pain management-continue to monitor   Depression: Resume Zoloft and Remeron when diet is resumed  Dementia. Daughter reports progression of the last several months. Baseline patient knows who she is and recognizes family but not always sure of where she is. Resume home medications once diet resumed post surgery.  DVT prophylaxis: SCD Code Status: DNR Family Communication: No family at bedside  Disposition Plan: Remain inpatient-discharge back to SNF once medically cleared   Consultants:   Orthopedics   Procedures:   5/24>>Right Hip Hemiarthroplasty and Right Wrist Distal Radius and Ulna ORIF  Antimicrobials:  Anti-infectives (From admission, onward)   Start     Dose/Rate Route Frequency Ordered Stop   09/20/17 0815  ceFAZolin (ANCEF) IVPB 2g/100 mL premix     2 g 200 mL/hr over 30  Minutes Intravenous On call to O.R. 09/20/17 0801 09/21/17 0559   09/20/17 0600  ceFAZolin (ANCEF) IVPB 2g/100 mL premix     2 g 200 mL/hr over 30 Minutes Intravenous On call to O.R. 09/19/17 1450 09/21/17 0559   09/19/17 1215  ceFAZolin (ANCEF) IVPB 1 g/50 mL premix     1 g 100 mL/hr over 30 Minutes Intravenous  Once 09/19/17 1207 09/19/17 1409          Subjective: Patient alert to self, she is resting in bed with some mild complaints of pain to wrist and foot upon palpation. Bucks tractions to right leg actively, awaiting surgery later today.   Objective: Vitals:   09/19/17 1554 09/19/17 1929 09/20/17 0013 09/20/17 0410  BP:  (!) 163/74 (!) 159/80 (!) 150/66  Pulse:  78 89 85  Resp:      Temp:  99.2 F (37.3 C)  99 F (37.2 C)  TempSrc:  Oral  Oral  SpO2:  97%  92%  Weight: 62.1 kg (136 lb 14.5 oz)     Height:  (1.6 m)       Intake/Output Summary (Last 24 hours) at 09/20/2017 1021 Last data filed at 09/20/2017 8119 Gross per 24 hour  Intake 427.5 ml  Output 200 ml  Net 227.5 ml   Filed Weights   09/19/17 1554  Weight: 62.1 kg (136 lb 14.5 oz)    Examination: BP (!) 150/66 (BP Location: Left Arm)   Pulse 85   Temp 99 F (37.2 C) (Oral)   Resp 16   Ht  (1.6 m)   Wt 62.1 kg (136 lb 14.5 oz)   SpO2 92%   BMI 24.25 kg/m  Filed Weights   09/19/17 1554  Weight: 62.1 kg (136 lb 14.5 oz)   General exam: Appears calm and comfortable, in slight discomfort Respiratory system: Clear to auscultation. Respiratory effort normal. Cardiovascular system: S1 & S2 heard, RRR. No JVD, murmurs, rubs, gallops or clicks. No pedal edema. Gastrointestinal system: Abdomen is nondistended, soft and nontender. No organomegaly or masses felt. Normal bowel sounds heard. Central nervous system: Alert and oriented. No focal neurological deficits. Extremities: Right lower extremity in bucks traction, cap refill less than 3 seconds, right wrist in splint-cap refil less than 3  seconds Skin: No rashes, lesions or ulcers Psychiatry: Judgement and insight appear at baseline. Mood & affect appropriate.   Data Reviewed: I have personally reviewed following labs and imaging studies  CBC: Recent Labs  Lab 09/19/17 1058 09/20/17 0429  WBC 11.5* 11.0*  NEUTROABS 7.5  --   HGB 13.6 12.5  HCT 42.3 39.3  MCV 83.9 84.5  PLT 217 166   Basic Metabolic Panel: Recent Labs  Lab 09/19/17 1205 09/20/17 0429  NA 142 142  K 3.7 3.5  CL 105 107  CO2 27 25  GLUCOSE 145* 122*  BUN 12 9  CREATININE 1.06* 0.78  CALCIUM 8.7* 8.4*   GFR: Estimated Creatinine Clearance: 39.4 mL/min (by C-G formula based on SCr of 0.78 mg/dL). Liver Function Tests: Recent Labs  Lab 09/19/17 1205  AST 29  ALT 16  ALKPHOS 102  BILITOT 1.0  PROT 6.6  ALBUMIN 3.4*   No results for input(s): LIPASE, AMYLASE in the last 168 hours. No results for input(s): AMMONIA in the last 168 hours. Coagulation Profile: Recent Labs  Lab 09/19/17 1205  INR 1.08   Cardiac Enzymes: No results for input(s): CKTOTAL, CKMB, CKMBINDEX, TROPONINI in the last 168 hours. BNP (last 3 results) No results for input(s): PROBNP in the last 8760 hours. HbA1C: No results for input(s): HGBA1C in the last 72 hours. CBG: No results for input(s): GLUCAP in the last 168 hours. Lipid Profile: No results for input(s): CHOL, HDL, LDLCALC, TRIG, CHOLHDL, LDLDIRECT in the last 72 hours. Thyroid Function Tests: No results for input(s): TSH, T4TOTAL, FREET4, T3FREE, THYROIDAB in the last 72 hours. Anemia Panel: No results for input(s): VITAMINB12, FOLATE, FERRITIN, TIBC, IRON, RETICCTPCT in the last 72 hours. Urine analysis:    Component Value Date/Time   COLORURINE YELLOW 11/12/2016 1845   APPEARANCEUR HAZY (A) 11/12/2016 1845   LABSPEC 1.006 11/12/2016 1845   PHURINE 6.0 11/12/2016 1845   GLUCOSEU NEGATIVE 11/12/2016 1845   HGBUR MODERATE (A) 11/12/2016 1845   BILIRUBINUR NEGATIVE 11/12/2016 1845    KETONESUR NEGATIVE 11/12/2016 1845   PROTEINUR 100 (A) 11/12/2016 1845   UROBILINOGEN 0.2 06/03/2014 0740   NITRITE POSITIVE (A) 11/12/2016 1845   LEUKOCYTESUR LARGE (A) 11/12/2016 1845   Sepsis Labs: (procalcitonin:4,lacticidven:4)  ) Recent Results (from the past 240 hour(s))  Surgical pcr screen     Status: None   Collection Time: 09/19/17  8:00 PM  Result Value Ref Range Status   MRSA, PCR NEGATIVE NEGATIVE Final   Staphylococcus aureus NEGATIVE NEGATIVE Final    Comment: (NOTE) The Xpert SA Assay (FDA approved for NASAL specimens in patients 43 years of age and older), is one component of a comprehensive surveillance program. It is not intended to diagnose infection nor to guide or monitor treatment. Performed at [facility], [address], [place], [place] [address]       Radiology Studies: Dg Chest 1 View  Result Date: 09/19/2017 CLINICAL DATA:  Weakness. The patient suffered a right hip fracture due to a fall today. EXAM: CHEST  1 VIEW COMPARISON:  PA and lateral chest 11/12/2016. FINDINGS: The lungs are clear. Heart size is upper normal. Aortic atherosclerosis is identified. The patient is status post aortic valve repair. No pneumothorax or pleural effusion. No acute bony abnormality. IMPRESSION: No acute disease. Atherosclerosis. Electronically Signed   By: Drusilla Kanner M.D.   On: 09/19/2017 11:45   Dg Wrist Complete Right  Result Date: 09/19/2017 CLINICAL DATA:  Fall.  Wrist injury. EXAM: RIGHT WRIST - COMPLETE 3+ VIEW COMPARISON:  No recent prior. FINDINGS: Severely displaced comminuted distal right radial metaphyseal fracture is present. Displaced distal ulnar fracture is present. Prominent overlying of fracture fragments noted. Diffuse osteopenia and degenerative change. IMPRESSION: Severely displaced and comminuted distal right radial metaphyseal fracture. Displaced distal ulnar fracture is also present. Fracture fragments are overriding.  Electronically Signed   By: Maisie Fus  Register   On: 09/19/2017 11:48   Dg Hand Complete Right  Result Date: 09/19/2017 CLINICAL DATA:  Fall.  Wrist injury. EXAM: RIGHT HAND - COMPLETE 3+ VIEW COMPARISON:  No prior. FINDINGS: Comminuted displaced overriding fractures are noted of the distal radius and ulna. Diffuse osteopenia degenerative change. No radiopaque foreign body. Peripheral vascular calcification. IMPRESSION: 1. Displaced comminuted angulated fractures of the distal radius and ulna. 2.  Peripheral vascular disease. Electronically Signed   By: Maisie Fus  Register   On: 09/19/2017 11:49   Dg Hip Unilat With Pelvis 2-3 Views Right  Result Date: 09/19/2017 CLINICAL DATA:  Right hip pain after fall. EXAM: DG HIP (WITH OR WITHOUT PELVIS) 2-3V RIGHT COMPARISON:  CT abdomen pelvis dated November 13, 2016. FINDINGS: Acute, displaced fracture through the right femoral neck. No dislocation. The left hip is unremarkable. The pubic symphysis and sacroiliac joints are intact. Osteopenia. Unchanged calcifications in the left pelvis associated with known large dermoid. IMPRESSION: 1. Acute, displaced right femoral neck fracture. Electronically Signed   By: Obie Dredge M.D.   On: 09/19/2017 11:47        Scheduled Meds: . chlorhexidine  60 mL Topical Once  . metoprolol tartrate  2.5 mg Intravenous Q6H  . povidone-iodine  2 application Topical Once   Continuous Infusions: . sodium chloride 50 mL/hr at 09/19/17 1627  .  ceFAZolin (ANCEF) IV    .  ceFAZolin (ANCEF) IV    . methocarbamol (ROBAXIN)  IV       LOS: 1 day   Time spent:  25 minutes-Greater than 50% of this time was spent in counseling, explanation of diagnosis, planning of further management, and coordination of care.   Delfin Gant, NP [facility]  If 7PM-7AM, please contact night-coverage [email or web address] Password [facility] 09/20/2017, 10:21 AM   Attending MD note  Patient was seen, examined,treatment plan was discussed with  the NP.  I have personally reviewed the clinical findings, lab, imaging studies and management of this patient in detail. I agree with the documentation, as recorded by the NP.   Patient is 84 year old female who aortic stenosis-status post underwent TAVR, A. Fib on Eliquis-presented with a mechanical fall-found to have a right wrist and a right hip fracture.  Subjective: Lying comfortably in bed.  Denies any chest pain or shortness of breath  On Exam: Vitals:   09/19/17 1929 09/20/17 0013 09/20/17 0410 09/20/17 1141  BP: (!) 163/74 (!) 159/80 (!) 150/66 (!) 144/69  Pulse: 78 89 85 85  Resp:  Temp: 99.2 F (37.3 C)  99 F (37.2 C) 98.6 F (37 C)  TempSrc: Oral  Oral Oral  SpO2: 97%  92% 96%  Weight:      Height:       Gen. exam: Awake, alert, not in any distress Chest: Good air entry bilaterally, no rhonchi or rales CVS: S1-S2 regular, no murmurs Abdomen: Soft, nontender and nondistended Neurology: Non-focal Skin: No rash or lesions   Lab Data: CBC: Recent Labs  Lab 09/19/17 1058 09/20/17 0429  WBC 11.5* 11.0*  NEUTROABS 7.5  --   HGB 13.6 12.5  HCT 42.3 39.3  MCV 83.9 84.5  PLT 217 166    Basic Metabolic Panel: Recent Labs  Lab 09/19/17 1205 09/20/17 0429  NA 142 142  K 3.7 3.5  CL 105 107  CO2 27 25  GLUCOSE 145* 122*  BUN 12 9  CREATININE 1.06* 0.78  CALCIUM 8.7* 8.4*   Assessment and plan: Distal right wrist and hip fracture: Orthopedic/hand surgery following for surgical repair later today  Hypertension: Moderate control-continue amlodipine-follow and optimize  PAF: Not on any rate control agents-Eliquis on hold-resume when okay with orthopedics  Severe AS status post TAVR  Rest as above  [employer] [facility]

## 2017-09-20 NOTE — Op Note (Signed)
09/19/2017 - 09/20/2017 [facility] OR  Operative Note  Pre Op Diagnosis: Right extraarticular distal radius fracture, right open distal ulna fracture  Post Op Diagnosis: Right extraarticular distal radius fracture, right open distal ulna fracture  Procedure:  1. ORIF Right extraarticular distal radius fracture 2. Irrigation and debridement open right distal ulna fracture including skin, subcutaneous tissue, and tissue adjacent to bone 3. Reduction right distal ulna fracture without fixation  Surgeon: Betha Loa, MD  Assistant: none  Anesthesia: General  Fluids: Per anesthesia flow sheet  EBL: minimal  Complications: None  Specimen: None  Tourniquet Time:  Total Tourniquet Time Documented: Upper Arm (Right) - 82 minutes Total: Upper Arm (Right) - 82 minutes   Disposition: Stable to PACU  INDICATIONS:  Rebecca [name] is a 84 y.o. female who fell yesterday injuring right hip and wrist.  Seen in ED where XR revealed distal radius and ulna fractures with open wound at ulnar side of wrist.  Discussed nonoperative and operative treatment options with patient and her daughter who is POA.  They wished to proceed with operative fixation of the radius and possibly the ulna with irrigation and debridement.  Risks, benefits, and alternatives of surgery were discussed including the risk of blood loss; infection; damage to nerves, vessels, tendons, ligaments, bone; failure of surgery; need for additional surgery; complications with wound healing; continued pain; nonunion; malunion; stiffness.  We also discussed the possible need for bone graft and the benefits and risks including the possibility of disease transmission.  She voiced understanding of these risks and elected to proceed.    OPERATIVE COURSE:  After being identified preoperatively by myself, the patient and I agreed upon the procedure and site of procedure.  Surgical site was marked.  The risks, benefits and alternatives of the surgery were  reviewed and she wished to proceed.  Surgical consent had been signed.  She was given IV Ancef as preoperative antibiotic prophylaxis.  She was transferred to the operating room.  Procedure on her right hip was performed first and documented under a separate note.  After completion of that procedure, the right upper extremity was placed on an armboard. General anesthesia had been induced by the anesthesiologist at the start of the procedure.  The right upper extremity was prepped and draped in normal sterile orthopedic fashion.  A surgical pause was performed between the surgeons, anesthesia and operating room staff, and all were in agreement as to the patient, procedure and site of procedure.  Tourniquet at the proximal aspect of the extremity was inflated to 250 mmHg after exsanguination of the limb with an Esmarch bandage.  There were 2 wounds at the ulnar side of the wrist.  Both were extended.  There is no gross contamination.  The distal ulna fracture was medicating with the dorsal ulnar-sided wound.  Both wounds were debrided sharply using the knife and scissors to remove damaged skin and subtendinous tissues.  Contaminated hematoma was removed from the fracture site.  Fracture site was copiously irrigated with sterile saline.  The wounds were closed with 4-0 nylon horizontal mattress fashion the exception of small portion of the traumatic part of the wound.  Attention was turned to the distal radius.  Standard volar Sherilyn Cooter approach was used.  The bipolar electrocautery was used to obtain hemostasis.  The superficial and deep portions of the FCR tendon sheath were incised, and the FCR and FPL were swept ulnarly to protect the palmar cutaneous branch of the median nerve.  The brachioradialis was released  at the radial side of the radius.  The pronator quadratus had been torn from the injury.  The remaining portion was released and elevated with the periosteal elevator.  The fracture site was identified and  cleared of soft tissue interposition and hematoma.  It was reduced under direct visualization.  The distal ulna was reduced at the same time.  There was a skin tear on the dorsum of the wrist.  There is comminution of the metaphysis.  This was pieced back together.  I elected not to place any bone graft due to the open nature of the ulna fracture.    An AcuMed volar distal radial locking plate was selected.  It was secured to the bone with the guidepins.  C-arm was used in AP and lateral projections to ensure appropriate reduction and position of the hardware and adjustments made as necessary.  Standard AO drilling and measuring technique was used.  A single screw was placed in the slotted hole in the shaft of the plate.  The distal holes were filled with locking pegs with the exception of the styloid holes, which were filled with locking screws.  The remaining holes in the shaft of the plate were filled with nonlocking screws.  Good purchase was obtained.  C-arm was used in AP, lateral and oblique projections to ensure appropriate reduction and position of hardware, which was the case.  The ulna was reduced into an acceptable position.  There was no intra-articular penetration of hardware.  The wounds were copiously irrigated with sterile saline.  Pronator quadratus was repaired back over [name] of the plate using 4-0 Vicryl suture.  Vicryl suture was placed in the subcutaneous tissues in an inverted interrupted fashion and the skin was closed with 4-0 nylon in a horizontal mattress fashion.  The skin tear was placed back in place and covered with a Xeroform dressing.  There was good pronation and supination of the wrist without crepitance.  The wound was then dressed with sterile Xeroform, 4x4s, and wrapped with a Kerlix bandage.  A sugar tong splint was placed and wrapped with Kerlix and Ace bandage.  Tourniquet was deflated at 82 minutes.  Fingertips were pink with brisk capillary refill after deflation of the  tourniquet.  Operative drapes were broken down.  The patient was awoken from anesthesia safely.  She was transferred back to the stretcher and taken to the PACU in stable condition.  She is currently admitted.  I will see her back in the office in approximately 1 week.   Tami Ribas, MD Electronically signed, 09/20/17

## 2017-09-20 NOTE — Social Work (Signed)
Pt is from [facility]. CSW following for recommendations and to support disposition.   Doy Hutching, LCSWA [facility] Clinical Social Work [phone]

## 2017-09-20 NOTE — Progress Notes (Signed)
Orthopedic Tech Progress Note Patient Details:  Rebecca [name] 1925/08/02 [id number]  Patient ID: [name], female   DOB: 1925-12-20, 84 y.o.   MRN: [id number] Pt cant have ohf due to age restrictions.  [name] 09/20/2017, 6:56 PM

## 2017-09-20 NOTE — Interval H&P Note (Signed)
History and Physical Interval Note:  09/20/2017 12:51 PM  Rebecca [name]  has presented today for surgery, with the diagnosis of RIGHT HIP FEMORAL NECK FRACTURE, Rght Wrist Fracture  The various methods of treatment have been discussed with the patient and family. After consideration of risks, benefits and other options for treatment, the patient has consented to  Procedure(s): RIGHT ARTHROPLASTY BIPOLAR HIP (HEMIARTHROPLASTY), VERSES TOTAL HIP ARTHROPLASTY (Right) OPEN REDUCTION INTERNAL FIXATION (ORIF) DISTAL RADIAL FRACTURE, possible Ulna Fracture ORIF (Right) IRRIGATION AND DEBRIDEMENT Open Wrist Fracture (Right) as a surgical intervention .  The patient's history has been reviewed, patient examined, no change in status, stable for surgery.  I have reviewed the patient's chart and labs.  Questions were answered to the patient's satisfaction.    The risks, benefits, and alternatives were discussed with the patient. There are risks associated with the surgery including, but not limited to, problems with anesthesia (death), infection, instability (giving out of the joint), dislocation, differences in leg length/angulation/rotation, fracture of bones, loosening or failure of implants, hematoma (blood accumulation) which may require surgical drainage, blood clots, pulmonary embolism, nerve injury (foot drop and lateral thigh numbness), and blood vessel injury. The patient understands these risks and elects to proceed.    Iline Oven Azaya Goedde

## 2017-09-20 NOTE — Discharge Instructions (Addendum)
° °Dr. Brian Swinteck °Joint Replacement Specialist °[facility] °[address] °[place],  [address] °[phone] ° ° °TOTAL HIP REPLACEMENT POSTOPERATIVE DIRECTIONS ° ° ° °Hip Rehabilitation, Guidelines Following Surgery  ° °WEIGHT BEARING °Weight bearing as tolerated with assist device (walker, cane, etc) as directed, use it as long as suggested by your surgeon or therapist, typically at least 4-6 weeks. ° °The results of a hip operation are greatly improved after range of motion and muscle strengthening exercises. Follow all safety measures which are given to protect your hip. If any of these exercises cause increased pain or swelling in your joint, decrease the amount until you are comfortable again. Then slowly increase the exercises. Call your caregiver if you have problems or questions.  ° °HOME CARE INSTRUCTIONS  °Most of the following instructions are designed to prevent the dislocation of your new hip.  °Remove items at home which could result in a fall. This includes throw rugs or furniture in walking pathways.  °Continue medications as instructed at time of discharge. °· You may have some home medications which will be placed on hold until you complete the course of blood thinner medication. °· You may start showering once you are discharged home. Do not remove your dressing. °Do not put on socks or shoes without following the instructions of your caregivers.   °Sit on chairs with arms. Use the chair arms to help push yourself up when arising.  °Arrange for the use of a toilet seat elevator so you are not sitting low.  °· Walk with walker as instructed.  °You may resume a sexual relationship in one month or when given the OK by your caregiver.  °Use walker as long as suggested by your caregivers.  °You may put full weight on your legs and walk as much as is comfortable. °Avoid periods of inactivity such as sitting longer than an hour when not asleep. This helps prevent  blood clots.  °You may return to work once you are cleared by your surgeon.  °Do not drive a car for 6 weeks or until released by your surgeon.  °Do not drive while taking narcotics.  °Wear elastic stockings for two weeks following surgery during the day but you may remove then at night.  °Make sure you keep all of your appointments after your operation with all of your doctors and caregivers. You should call the office at the above phone number and make an appointment for approximately two weeks after the date of your surgery. °Please pick up a stool softener and laxative for home use as long as you are requiring pain medications. °· ICE to the affected hip every three hours for 30 minutes at a time and then as needed for pain and swelling. Continue to use ice on the hip for pain and swelling from surgery. You may notice swelling that will progress down to the foot and ankle.  This is normal after surgery.  Elevate the leg when you are not up walking on it.   °It is important for you to complete the blood thinner medication as prescribed by your doctor. °· Continue to use the breathing machine which will help keep your temperature down.  It is common for your temperature to cycle up and down following surgery, especially at night when you are not up moving around and exerting yourself.  The breathing machine keeps your lungs expanded and your temperature down. ° °RANGE OF MOTION AND STRENGTHENING EXERCISES  °These exercises   are designed to help you keep full movement of your hip joint. Follow your caregiver's or physical therapist's instructions. Perform all exercises about fifteen times, three times per day or as directed. Exercise both hips, even if you have had only one joint replacement. These exercises can be done on a training (exercise) mat, on the floor, on a table or on a bed. Use whatever works the best and is most comfortable for you. Use music or television while you are exercising so that the exercises  are a pleasant break in your day. This will make your life better with the exercises acting as a break in routine you can look forward to.  Lying on your back, slowly slide your foot toward your buttocks, raising your knee up off the floor. Then slowly slide your foot back down until your leg is straight again.  Lying on your back spread your legs as far apart as you can without causing discomfort.  Lying on your side, raise your upper leg and foot straight up from the floor as far as is comfortable. Slowly lower the leg and repeat.  Lying on your back, tighten up the muscle in the front of your thigh (quadriceps muscles). You can do this by keeping your leg straight and trying to raise your heel off the floor. This helps strengthen the largest muscle supporting your knee.  Lying on your back, tighten up the muscles of your buttocks both with the legs straight and with the knee bent at a comfortable angle while keeping your heel on the floor.   SKILLED REHAB INSTRUCTIONS: If the patient is transferred to a skilled rehab facility following release from the hospital, a list of the current medications will be sent to the facility for the patient to continue.  When discharged from the skilled rehab facility, please have the facility set up the patient's Home Health Physical Therapy prior to being released. Also, the skilled facility will be responsible for providing the patient with their medications at time of release from the facility to include their pain medication and their blood thinner medication. If the patient is still at the rehab facility at time of the two week follow up appointment, the skilled rehab facility will also need to assist the patient in arranging follow up appointment in our office and any transportation needs.  MAKE SURE YOU:  Understand these instructions.  Will watch your condition.  Will get help right away if you are not doing well or get worse.  Pick up stool softner and  laxative for home use following surgery while on pain medications. Do not remove your dressing. The dressing is waterproof--it is OK to take showers. Continue to use ice for pain and swelling after surgery. Do not use any lotions or creams on the incision until instructed by your surgeon. Total Hip Protocol.   [facility] Instructions Hand Surgery  Wound Care: Keep your hand elevated above the level of your heart.  Do not allow it to dangle by your side.  Keep the dressing dry and do not remove it unless your doctor advises you to do so.  He will usually change it at the time of your post-op visit.  Moving your fingers is advised to stimulate circulation but will depend on the site of your surgery.  If you have a splint applied, your doctor will advise you regarding movement.  Activity: Do not drive or operate machinery today.  Rest today and then you may return to your  normal activity and work as indicated by Designer, multimedia.  Diet:  Drink liquids today or eat a light diet.  You may resume a regular diet tomorrow.    General expectations: Pain for two to three days. Fingers may become slightly swollen.  Call your doctor if any of the following occur: Severe pain not relieved by pain medication. Elevated temperature. Dressing soaked with blood. Inability to move fingers. White or bluish color to fingers.  Information on my medicine - ELIQUIS (apixaban)  This medication education was reviewed with me or my healthcare representative as part of my discharge preparation.    Why was Eliquis prescribed for you? Eliquis was prescribed for you to reduce the risk of a blood clot forming that can cause a stroke if you have a medical condition called atrial fibrillation (a type of irregular heartbeat).  What do You need to know about Eliquis ? Take your Eliquis TWICE DAILY - one tablet in the morning and one tablet in the evening with or without food. If you have difficulty swallowing  the tablet whole please discuss with your pharmacist how to take the medication safely.  Take Eliquis exactly as prescribed by your doctor and DO NOT stop taking Eliquis without talking to the doctor who prescribed the medication.  Stopping may increase your risk of developing a stroke.  Refill your prescription before you run out.  After discharge, you should have regular check-up appointments with your healthcare provider that is prescribing your Eliquis.  In the future your dose may need to be changed if your kidney function or weight changes by a significant amount or as you get older.  What do you do if you miss a dose? If you miss a dose, take it as soon as you remember on the same day and resume taking twice daily.  Do not take more than one dose of ELIQUIS at the same time to make up a missed dose.  Important Safety Information A possible side effect of Eliquis is bleeding. You should call your healthcare provider right away if you experience any of the following: ? Bleeding from an injury or your nose that does not stop. ? Unusual colored urine (red or dark brown) or unusual colored stools (red or black). ? Unusual bruising for unknown reasons. ? A serious fall or if you hit your head (even if there is no bleeding).  Some medicines may interact with Eliquis and might increase your risk of bleeding or clotting while on Eliquis. To help avoid this, consult your healthcare provider or pharmacist prior to using any new prescription or non-prescription medications, including herbals, vitamins, non-steroidal anti-inflammatory drugs (NSAIDs) and supplements.  This website has more information on Eliquis (apixaban): http://www.eliquis.com/eliquis/home  Additional discharge instructions  Please get your medications reviewed and adjusted by your Primary MD.  Please request your Primary MD to go over all Hospital Tests and Procedure/Radiological results at the follow up, please get all  Hospital records sent to your Prim MD by signing hospital release before you go home.  If you had Pneumonia of Lung problems at the Hospital: Please get a 2 view Chest X ray done in 6-8 weeks after hospital discharge or sooner if instructed by your Primary MD.  If you have Congestive Heart Failure: Please call your Cardiologist or Primary MD anytime you have any of the following symptoms:  1) 3 pound weight gain in 24 hours or 5 pounds in 1 week  2) shortness of breath, with or without  a dry hacking cough  3) swelling in the hands, feet or stomach  4) if you have to sleep on extra pillows at night in order to breathe  Follow cardiac low salt diet and 1.5 lit/day fluid restriction.  If you have diabetes Accuchecks 4 times/day, Once in AM empty stomach and then before each meal. Log in all results and show them to your primary doctor at your next visit. If any glucose reading is under 80 or above 300 call your primary MD immediately.  If you have Seizure/Convulsions/Epilepsy: Please do not drive, operate heavy machinery, participate in activities at heights or participate in high speed sports until you have seen by Primary MD or a Neurologist and advised to do so again.  If you had Gastrointestinal Bleeding: Please ask your Primary MD to check a complete blood count within one week of discharge or at your next visit. Your endoscopic/colonoscopic biopsies that are pending at the time of discharge, will also need to followed by your Primary MD.  Get Medicines reviewed and adjusted. Please take all your medications with you for your next visit with your Primary MD  Please request your Primary MD to go over all hospital tests and procedure/radiological results at the follow up, please ask your Primary MD to get all Hospital records sent to his/her office.  If you experience worsening of your admission symptoms, develop shortness of breath, life threatening emergency, suicidal or homicidal  thoughts you must seek medical attention immediately by calling 911 or calling your MD immediately  if symptoms less severe.  You must read complete instructions/literature along with all the possible adverse reactions/side effects for all the Medicines you take and that have been prescribed to you. Take any new Medicines after you have completely understood and accpet all the possible adverse reactions/side effects.   Do not drive or operate heavy machinery when taking Pain medications.   Do not take more than prescribed Pain, Sleep and Anxiety Medications  Special Instructions: If you have smoked or chewed Tobacco  in the last 2 yrs please stop smoking, stop any regular Alcohol  and or any Recreational drug use.  Wear Seat belts while driving.  Please note You were cared for by a hospitalist during your hospital stay. If you have any questions about your discharge medications or the care you received while you were in the hospital after you are discharged, you can call the unit and asked to speak with the hospitalist on call if the hospitalist that took care of you is not available. Once you are discharged, your primary care physician will handle any further medical issues. Please note that NO REFILLS for any discharge medications will be authorized once you are discharged, as it is imperative that you return to your primary care physician (or establish a relationship with a primary care physician if you do not have one) for your aftercare needs so that they can reassess your need for medications and monitor your lab values.  You can reach the hospitalist office at phone [phone] or fax [phone]   If you do not have a primary care physician, you can call [phone] for a physician referral.

## 2017-09-20 NOTE — Anesthesia Preprocedure Evaluation (Addendum)
Anesthesia Evaluation  Patient identified by MRN, date of birth, ID band Patient awake    Reviewed: Allergy & Precautions, H&P , NPO status , Patient's Chart, lab work & pertinent test results  Airway Mallampati: III  TM Distance: >3 FB Neck ROM: Full    Dental no notable dental hx. (+) Teeth Intact, Dental Advisory Given, Missing   Pulmonary neg pulmonary ROS, former smoker,    Pulmonary exam normal breath sounds clear to auscultation       Cardiovascular hypertension, Pt. on medications + dysrhythmias Atrial Fibrillation  Rhythm:Regular Rate:Normal  S/p TAVR   Neuro/Psych Anxiety Depression Dementia negative neurological ROS     GI/Hepatic negative GI ROS, Neg liver ROS,   Endo/Other  negative endocrine ROS  Renal/GU negative Renal ROS  negative genitourinary   Musculoskeletal   Abdominal   Peds  Hematology negative hematology ROS (+)   Anesthesia Other Findings   Reproductive/Obstetrics negative OB ROS                          Anesthesia Physical Anesthesia Plan  ASA: III  Anesthesia Plan: General   Post-op Pain Management:    Induction: Intravenous  PONV Risk Score and Plan: 4 or greater and Ondansetron, Dexamethasone and Treatment may vary due to age or medical condition  Airway Management Planned: Oral ETT  Additional Equipment:   Intra-op Plan:   Post-operative Plan: Extubation in OR  Informed Consent: I have reviewed the patients History and Physical, chart, labs and discussed the procedure including the risks, benefits and alternatives for the proposed anesthesia with the patient or authorized representative who has indicated his/her understanding and acceptance.   Dental advisory given  Plan Discussed with: CRNA and Surgeon  Anesthesia Plan Comments:        Anesthesia Quick Evaluation

## 2017-09-20 NOTE — Brief Op Note (Signed)
09/19/2017 - 09/20/2017  4:50 PM  PATIENT:  Rebecca [name]  84 y.o. female  PRE-OPERATIVE DIAGNOSIS:  RIGHT HIP FEMORAL NECK FRACTURE, Rght Wrist Fracture  POST-OPERATIVE DIAGNOSIS:  RIGHT HIP FEMORAL NECK FRACTURE, Rght Wrist Fracture  PROCEDURE:  Procedure(s): RIGHT ARTHROPLASTY BIPOLAR HIP (HEMIARTHROPLASTY) (Right) OPEN REDUCTION INTERNAL FIXATION (ORIF) DISTAL RADIAL FRACTURE, possible Ulna Fracture ORIF (Right) IRRIGATION AND DEBRIDEMENT Open Wrist Fracture (Right)  SURGEON:  Surgeon(s) and Role: Panel 1:    * Swinteck, Arlys John, MD - Primary Panel 2:    * Betha Loa, MD - Primary  PHYSICIAN ASSISTANT:   ASSISTANTS: none   ANESTHESIA:   general  EBL:  150 mL   BLOOD ADMINISTERED:none  DRAINS: none   LOCAL MEDICATIONS USED:  NONE  SPECIMEN:  No Specimen  DISPOSITION OF SPECIMEN:  N/A  COUNTS:  YES  TOURNIQUET:   Total Tourniquet Time Documented: Upper Arm (Right) - 82 minutes Total: Upper Arm (Right) - 82 minutes   DICTATION: .Note written in EPIC  PLAN OF CARE: Admit to inpatient   PATIENT DISPOSITION:  PACU - hemodynamically stable.   Delay start of Pharmacological VTE agent (>24hrs) due to surgical blood loss or risk of bleeding: no

## 2017-09-20 NOTE — Op Note (Addendum)
OPERATIVE REPORT  SURGEON: Samson Frederic, MD   ASSISTANT: Hart Carwin, RNFA.  PREOPERATIVE DIAGNOSIS: Displaced Right femoral neck fracture.   POSTOPERATIVE DIAGNOSIS: Displaced Right femoral neck fracture.   PROCEDURE: Right hip hemiarthroplasty, anterior approach.   IMPLANTS: DePuy Tri Lock stem, size 6, std offset, with a -3 mm spacer and a 44 mm monopolar head ball.  ANESTHESIA:  General  ANTIBIOTICS: 2g ancef.  ESTIMATED BLOOD LOSS:-150 mL    DRAINS: None.  COMPLICATIONS: None   CONDITION: stable to Dr. Merlyn Lot for R wrist.   BRIEF CLINICAL NOTE: Rebecca [name] is a 84 y.o. female with a displaced Right femoral neck fracture. The patient was admitted to the hospitalist service and underwent perioperative risk stratification and medical optimization. The risks, benefits, and alternatives to hemiarthroplasty were explained, and the patient elected to proceed.  PROCEDURE IN DETAIL: The patient was taken to the operating room and general anesthesia was induced on the hospital bed. The patient was then positioned on the Hana table. All bony prominences were well padded. The hip was prepped and draped in the normal sterile surgical fashion. A time-out was called verifying side and site of surgery. Antibiotics were given within 60 minutes of beginning the procedure.  The direct anterior approach to the hip was performed through the Hueter interval. Lateral femoral circumflex vessels were treated with the Auqumantys. The anterior capsule was exposed and an inverted T capsulotomy was made. Fracture hematoma was encountered and evacuated. The patient was found to have a comminuted Right subcapital femoral neck fracture. I freshened the femoral neck cut with a saw. I removed the femoral neck fragment. A corkscrew was placed into the head and the head was removed. This was passed to the back table and was measured.  Acetabular exposure was achieved. I examined the  articular cartilage which was intact. The labrum was intact. A 44 mm trial head was placed and found to have excellent fit.  I then gained femoral exposure taking care to protect the abductors and greater trochanter. This was performed using standard external rotation, extension, and adduction. The capsule was peeled off the inner aspect of the greater trochanter, taking care to preserve the short external rotators. A cookie cutter was used to enter the femoral canal, and then the femoral canal finder was used to confirm location. I then sequentially broached up to a size 6. Calcar planer was used on the femoral neck remnant. I paced a std neck and a 36+ 0 head ball.The hip was reduced. Leg lengths were checked fluoroscopically. The hip was dislocated and trial components were removed. I placed the real stem followed by the real spacer and head ball. A single reduction maneuver was performed and the hip was reduced. Fluoroscopy was used to confirm component position and leg lengths. At 90 degrees of external rotation and extension, the hip was stable to an anterior directed force.  The wound was copiously irrigated with normal saline solution. Marcaine solution was injected into the periarticular soft tissue. The wound was closed in layers using #1 Vicryl and V-Loc for the fascia, 2-0 Vicryl for the subcutaneous fat, 2-0 Monocryl for the deep dermal layer, 3-0 running Monocryl subcuticular stitch and glue for the skin. Once the glue was fully dried, an Aquacell Ag dressing was applied. The patient was then turned over to the care of Dr. Merlyn Lot for her right wrist. Sponge, needle, and instrument counts were correct at the end of the case x2. The patient tolerated the procedure well and  there were no known complications.  Postoperatively, we will readmit the patient to the hospitalist service.  Beginning tomorrow morning, resume Eliquis at 2.5 mg p.o. twice daily .  For 48 to 72 hours, and  then the normal home dose may be resumed. She will work with physical and occupational therapy.  She will undergo disposition planning.  I will see her back in the office 2 weeks after discharge for routine postoperative care.

## 2017-09-20 NOTE — Anesthesia Procedure Notes (Signed)
Procedure Name: Intubation Date/Time: 09/20/2017 1:22 PM Performed by: Kyung Rudd, CRNA Pre-anesthesia Checklist: Patient identified, Emergency Drugs available, Suction available and Patient being monitored Patient Re-evaluated:Patient Re-evaluated prior to induction Oxygen Delivery Method: Circle system utilized Preoxygenation: Pre-oxygenation with 100% oxygen Induction Type: IV induction Ventilation: Mask ventilation without difficulty Laryngoscope Size: Mac and 3 Grade View: Grade I Tube type: Oral Tube size: 7.0 mm Number of attempts: 1 Airway Equipment and Method: Stylet Placement Confirmation: ETT inserted through vocal cords under direct vision,  positive ETCO2 and breath sounds checked- equal and bilateral Secured at: 20 cm Tube secured with: Tape Dental Injury: Teeth and Oropharynx as per pre-operative assessment

## 2017-09-21 DIAGNOSIS — F0390 Unspecified dementia without behavioral disturbance: Secondary | ICD-10-CM

## 2017-09-21 LAB — CBC
HEMATOCRIT: 30 % — AB (ref 36.0–46.0)
HEMOGLOBIN: 9.5 g/dL — AB (ref 12.0–15.0)
MCH: 26.9 pg (ref 26.0–34.0)
MCHC: 31.7 g/dL (ref 30.0–36.0)
MCV: 85 fL (ref 78.0–100.0)
PLATELETS: 142 10*3/uL — AB (ref 150–400)
RBC: 3.53 MIL/uL — AB (ref 3.87–5.11)
RDW: 15.3 % (ref 11.5–15.5)
WBC: 13.7 10*3/uL — ABNORMAL HIGH (ref 4.0–10.5)

## 2017-09-21 LAB — BASIC METABOLIC PANEL
Anion gap: 11 (ref 5–15)
BUN: 19 mg/dL (ref 6–20)
CHLORIDE: 104 mmol/L (ref 101–111)
CO2: 22 mmol/L (ref 22–32)
Calcium: 7.8 mg/dL — ABNORMAL LOW (ref 8.9–10.3)
Creatinine, Ser: 1.22 mg/dL — ABNORMAL HIGH (ref 0.44–1.00)
GFR calc Af Amer: 44 mL/min — ABNORMAL LOW (ref >60)
GFR calc non Af Amer: 38 mL/min — ABNORMAL LOW (ref >60)
GLUCOSE: 136 mg/dL — AB (ref 65–99)
POTASSIUM: 3.6 mmol/L (ref 3.5–5.1)
Sodium: 137 mmol/L (ref 135–145)

## 2017-09-21 MED ORDER — SERTRALINE HCL 100 MG PO TABS
100.0000 mg | ORAL_TABLET | Freq: Every day | ORAL | Status: DC
  Administered 2017-09-22 – 2017-09-25 (×4): 100 mg via ORAL
  Filled 2017-09-21 (×4): qty 1

## 2017-09-21 MED ORDER — TRIMETHOPRIM 100 MG PO TABS
100.0000 mg | ORAL_TABLET | Freq: Every day | ORAL | Status: DC
  Administered 2017-09-22 – 2017-09-24 (×3): 100 mg via ORAL
  Filled 2017-09-21 (×4): qty 1

## 2017-09-21 MED ORDER — POLYETHYLENE GLYCOL 3350 17 G PO PACK
17.0000 g | PACK | Freq: Every day | ORAL | Status: DC
  Administered 2017-09-22 – 2017-09-25 (×4): 17 g via ORAL
  Filled 2017-09-21 (×4): qty 1

## 2017-09-21 MED ORDER — TRAMADOL HCL 50 MG PO TABS
50.0000 mg | ORAL_TABLET | Freq: Four times a day (QID) | ORAL | Status: DC | PRN
  Administered 2017-09-22 – 2017-09-24 (×4): 50 mg via ORAL
  Filled 2017-09-21 (×4): qty 1

## 2017-09-21 MED ORDER — SODIUM CHLORIDE 0.9 % IV SOLN
INTRAVENOUS | Status: DC
Start: 2017-09-21 — End: 2017-09-23
  Administered 2017-09-21: 18:00:00 via INTRAVENOUS

## 2017-09-21 MED ORDER — MIRTAZAPINE 15 MG PO TABS
30.0000 mg | ORAL_TABLET | Freq: Every day | ORAL | Status: DC
  Administered 2017-09-22 – 2017-09-24 (×3): 30 mg via ORAL
  Filled 2017-09-21 (×3): qty 2

## 2017-09-21 MED ORDER — MEMANTINE HCL 10 MG PO TABS
10.0000 mg | ORAL_TABLET | Freq: Two times a day (BID) | ORAL | Status: DC
  Administered 2017-09-22 – 2017-09-25 (×7): 10 mg via ORAL
  Filled 2017-09-21 (×7): qty 1

## 2017-09-21 MED ORDER — ACETAMINOPHEN 500 MG PO TABS
1000.0000 mg | ORAL_TABLET | Freq: Three times a day (TID) | ORAL | Status: DC
  Administered 2017-09-21 – 2017-09-25 (×11): 1000 mg via ORAL
  Filled 2017-09-21 (×11): qty 2

## 2017-09-21 NOTE — Clinical Social Work Note (Signed)
Clinical Social Work Assessment  Patient Details  Name: Rebecca [name] MRN: [id number] Date of Birth: [birth year]  Date of referral:  09/21/17               Reason for consult:  Facility Placement                Permission sought to share information with:  Family Supports Permission granted to share information::  Yes, Release of Information Signed  Name::     Rebecca [name]  Agency::  Rebecca [name]  Relationship::  Daughter/ POA  Contact Information:  [phone]  Housing/Transportation Living arrangements for the past 2 months:  Assisted Living Facility Source of Information:  Rebecca [name], Adult Children Patient Interpreter Needed:  None Criminal Activity/Legal Involvement Pertinent to Current Situation/Hospitalization:  No - Comment as needed Significant Relationships:  Adult Children Lives with:  Self Do you feel safe going back to the place where you live?    Need for family participation in patient care:  No (Coment)  Care giving concerns:  Pt is only alert to self. CSW spoke with pt's daughter (POA) via telephone.   Social Worker assessment / plan:  CSW spoke with pt's daughter. Pt's daughter states pt was living at [name] (ALF) memory care unit. Pt's daughter states pt was wandering and was found outside the facility one day prior to being placed in Memory Care. PT is recommending SNF. Pt's daughter has been searching for SNF with locked memory care unit. Pt's daughter is interested in [name]. CSW will follow up with facility and pt's daughter.   Employment status:  Retired Health and safety inspector:  Medicare PT Recommendations:  Skilled Nursing Facility Information / Referral to community resources:  Skilled Nursing Facility  Patient/Family's Response to care:  Pt's daughter verbalized understanding of CSW role and expressed appreciation for support. Pt's daughter denies any concern regarding pt care at this time.   Patient/Family's Understanding of  and Emotional Response to Diagnosis, Current Treatment, and Prognosis:  Pt's daughter understanding and realistic regarding pt's physical limitations. Pt's daughter understands the need for pt to go to SNF at d/c--pt's daughter agreeable. Pt's daughter denies any concern regarding pt's treatment plan at this time. CSW will continue to provide support and facilitate d/c needs.   Emotional Assessment Appearance:  Appears stated age Attitude/Demeanor/Rapport:  Unable to Assess Affect (typically observed):  Unable to Assess Orientation:  Oriented to Self Alcohol / Substance use:  Not Applicable Psych involvement (Current and /or in the community):  No (Comment)  Discharge Needs  Concerns to be addressed:  Basic Needs, Care Coordination Readmission within the last 30 days:  No Current discharge risk:  Dependent with Mobility Barriers to Discharge:  Continued Medical Work up   [employer], LCSW 09/21/2017, 10:35 AM

## 2017-09-21 NOTE — Clinical Social Work Note (Signed)
Pt will need a memory care SNF. Pt was found wandering and found outside of the building at the ALF prior to being placed in memory care. CSW will start memory care SNF search.  [place], [place] [phone]

## 2017-09-21 NOTE — Progress Notes (Signed)
PROGRESS NOTE        PATIENT DETAILS Name: Rebecca [name] Age: 84 y.o. Sex: female Date of Birth: March 28, 1926 Admit Date: 09/19/2017 Admitting Physician Jonah Blue, MD BJY:NWGN, Darlen Round, MD  Brief Narrative: Patient is a 84 y.o. female with history of PAF on Eliquis, severe aortic stenosis status post TAVR-presented to the ED following a mechanical fall, she was found to have right hip fracture and a right open wrist fracture.  She was seen by hand surgery and orthopedics, and underwent surgical repair on 5/24.  See below for further details.  Subjective: Pleasantly confused-but able to answer most of my questions appropriately this morning.  Assessment/Plan: Closed right hip fracture and right distal radius and ulna fractures secondary to mechanical fall: Orthopedics and hand surgery following-underwent surgical repair on 5/24.  Postoperative care deferred to orthopedic/hand surgery.  Has been restarted on Eliquis-will probably require SNF on discharge.  Fever: One episode of fever earlier this morning-she does not look acutely ill-encourage incentive spirometry-if fever recurs/persists-then we can consider further work-up.  Anemia: Likely secondary to perioperative blood loss anemia-no indication to transfuse at this point-follow.  Mild AKI: Likely hemodynamically mediated-continue supportive care-recheck electrolytes tomorrow  PAF:  Rate controlled without use of any rate control medications, Eliquis is been resumed.  Follow.  Severe AS-status post TAVR   Chronic diastolic heart failure:  Compensated-no role for diuretics at this time.  Hypertension: Blood pressure currently controlled without the use of any antihypertensives.  Follow for now.   Depression:  Resume Zoloft and Remeron.  Dementia.  Presently confused-suspect she is at her usual baseline-resume Namenda.    DVT Prophylaxis: Full dose anticoagulation with Eliquis  Code  Status: DNR  Family Communication: None at bedside  Disposition Plan: Remain inpatient- SNF on discharge-sometime early next week  Antimicrobial agents: Anti-infectives (From admission, onward)   Start     Dose/Rate Route Frequency Ordered Stop   09/20/17 1815  ceFAZolin (ANCEF) IVPB 2g/100 mL premix     2 g 200 mL/hr over 30 Minutes Intravenous Every 6 hours 09/20/17 1815 09/21/17 0614   09/20/17 0815  ceFAZolin (ANCEF) IVPB 2g/100 mL premix  Status:  Discontinued     2 g 200 mL/hr over 30 Minutes Intravenous On call to O.R. 09/20/17 0801 09/20/17 1745   09/20/17 0600  ceFAZolin (ANCEF) IVPB 2g/100 mL premix     2 g 200 mL/hr over 30 Minutes Intravenous On call to O.R. 09/19/17 1450 09/20/17 1335   09/19/17 1215  ceFAZolin (ANCEF) IVPB 1 g/50 mL premix     1 g 100 mL/hr over 30 Minutes Intravenous  Once 09/19/17 1207 09/19/17 1409      Procedures: 5/24>> 1. ORIF Right extraarticular distal radius fracture 2. Irrigation and debridement open right distal ulna fracture including skin, subcutaneous tissue, and tissue adjacent to bone 3. Reduction right distal ulna fracture without fixation 4.RIGHT ARTHROPLASTY BIPOLAR HIP  CONSULTS:  ID  Time spent: 25- minutes-Greater than 50% of this time was spent in counseling, explanation of diagnosis, planning of further management, and coordination of care.  MEDICATIONS: Scheduled Meds: . acetaminophen  1,000 mg Oral Q8H  . apixaban  2.5 mg Oral BID  . docusate sodium  100 mg Oral BID   Continuous Infusions: . lactated ringers 10 mL/hr at 09/20/17 1228  . methocarbamol (ROBAXIN)  IV  PRN Meds:.fentaNYL (SUBLIMAZE) injection, menthol-cetylpyridinium **OR** phenol, methocarbamol **OR** methocarbamol (ROBAXIN)  IV, metoCLOPramide **OR** metoCLOPramide (REGLAN) injection, morphine injection, ondansetron (ZOFRAN) IV, ondansetron **OR** ondansetron (ZOFRAN) IV, polyethylene glycol, traMADol   PHYSICAL EXAM: Vital signs: Vitals:    09/20/17 2022 09/21/17 0010 09/21/17 0346 09/21/17 0803  BP: 120/60 (!) 97/53 (!) 99/50 127/65  Pulse: 90 (!) 131 (!) 102 (!) 108  Resp:      Temp: 98.2 F (36.8 C) 99.8 F (37.7 C) (!) 101.1 F (38.4 C) 99.8 F (37.7 C)  TempSrc: Oral Oral Oral Oral  SpO2: 100% 92% 91%   Weight:      Height:       Filed Weights   09/19/17 1554  Weight: 62.1 kg (136 lb 14.5 oz)   Body mass index is 24.25 kg/m.   General appearance :Awake, pleasantly confused, not in any distress.  HEENT: Atraumatic and Normocephalic Neck: supple, no JVD. No cervical lymphadenopathy. No thyromegaly Resp:Good air entry bilaterally, no added sounds  CVS: S1 S2 regular, no murmurs.  GI: Bowel sounds present, Non tender and not distended with no gaurding, rigidity or rebound.No organomegaly Extremities: B/L Lower Ext shows no edema Neurology:  Non focal Musculoskeletal:No digital cyanosis Skin:No Rash, warm and dry Wounds:N/A  I have personally reviewed following labs and imaging studies  LABORATORY DATA: CBC: Recent Labs  Lab 09/19/17 1058 09/20/17 0429 09/21/17 0640  WBC 11.5* 11.0* 13.7*  NEUTROABS 7.5  --   --   HGB 13.6 12.5 9.5*  HCT 42.3 39.3 30.0*  MCV 83.9 84.5 85.0  PLT 217 166 142*    Basic Metabolic Panel: Recent Labs  Lab 09/19/17 1205 09/20/17 0429 09/21/17 0640  NA 142 142 137  K 3.7 3.5 3.6  CL 105 107 104  CO2 GLUCOSE 145* 122* 136*  BUN CREATININE 1.06* 0.78 1.22*  CALCIUM 8.7* 8.4* 7.8*    GFR: Estimated Creatinine Clearance: 25.9 mL/min (A) (by C-G formula based on SCr of 1.22 mg/dL (H)).  Liver Function Tests: Recent Labs  Lab 09/19/17 1205  AST 29  ALT 16  ALKPHOS 102  BILITOT 1.0  PROT 6.6  ALBUMIN 3.4*   No results for input(s): LIPASE, AMYLASE in the last 168 hours. No results for input(s): AMMONIA in the last 168 hours.  Coagulation Profile: Recent Labs  Lab 09/19/17 1205  INR 1.08    Cardiac Enzymes: No results for  input(s): CKTOTAL, CKMB, CKMBINDEX, TROPONINI in the last 168 hours.  BNP (last 3 results) No results for input(s): PROBNP in the last 8760 hours.  HbA1C: No results for input(s): HGBA1C in the last 72 hours.  CBG: No results for input(s): GLUCAP in the last 168 hours.  Lipid Profile: No results for input(s): CHOL, HDL, LDLCALC, TRIG, CHOLHDL, LDLDIRECT in the last 72 hours.  Thyroid Function Tests: No results for input(s): TSH, T4TOTAL, FREET4, T3FREE, THYROIDAB in the last 72 hours.  Anemia Panel: No results for input(s): VITAMINB12, FOLATE, FERRITIN, TIBC, IRON, RETICCTPCT in the last 72 hours.  Urine analysis:    Component Value Date/Time   COLORURINE YELLOW 11/12/2016 1845   APPEARANCEUR HAZY (A) 11/12/2016 1845   LABSPEC 1.006 11/12/2016 1845   PHURINE 6.0 11/12/2016 1845   GLUCOSEU NEGATIVE 11/12/2016 1845   HGBUR MODERATE (A) 11/12/2016 1845   BILIRUBINUR NEGATIVE 11/12/2016 1845   KETONESUR NEGATIVE 11/12/2016 1845   PROTEINUR 100 (A) 11/12/2016 1845   UROBILINOGEN 0.2 06/03/2014 0740   NITRITE POSITIVE (  A) 11/12/2016 1845   LEUKOCYTESUR LARGE (A) 11/12/2016 1845    Sepsis Labs: Lactic Acid, Venous    Component Value Date/Time   LATICACIDVEN 1.54 11/12/2016 1655    MICROBIOLOGY: Recent Results (from the past 240 hour(s))  Surgical pcr screen     Status: None   Collection Time: 09/19/17  8:00 PM  Result Value Ref Range Status   MRSA, PCR NEGATIVE NEGATIVE Final   Staphylococcus aureus NEGATIVE NEGATIVE Final    Comment: (NOTE) The Xpert SA Assay (FDA approved for NASAL specimens in patients 65 years of age and older), is one component of a comprehensive surveillance program. It is not intended to diagnose infection nor to guide or monitor treatment. Performed at [facility], [address] [place], [place] [address]     RADIOLOGY STUDIES/RESULTS: Dg Chest 1 View  Result Date: 09/19/2017 CLINICAL DATA:  Weakness. The patient suffered a  right hip fracture due to a fall today. EXAM: CHEST  1 VIEW COMPARISON:  PA and lateral chest 11/12/2016. FINDINGS: The lungs are clear. Heart size is upper normal. Aortic atherosclerosis is identified. The patient is status post aortic valve repair. No pneumothorax or pleural effusion. No acute bony abnormality. IMPRESSION: No acute disease. Atherosclerosis. Electronically Signed   By: Drusilla Kanner M.D.   On: 09/19/2017 11:45   Dg Wrist Complete Right  Result Date: 09/19/2017 CLINICAL DATA:  Fall.  Wrist injury. EXAM: RIGHT WRIST - COMPLETE 3+ VIEW COMPARISON:  No recent prior. FINDINGS: Severely displaced comminuted distal right radial metaphyseal fracture is present. Displaced distal ulnar fracture is present. Prominent overlying of fracture fragments noted. Diffuse osteopenia and degenerative change. IMPRESSION: Severely displaced and comminuted distal right radial metaphyseal fracture. Displaced distal ulnar fracture is also present. Fracture fragments are overriding. Electronically Signed   By: Maisie Fus  Register   On: 09/19/2017 11:48   Pelvis Portable  Result Date: 09/20/2017 CLINICAL DATA:  Status post hip surgery EXAM: PORTABLE PELVIS 1-2 VIEWS COMPARISON:  09/19/2017 FINDINGS: Lobulated calcifications in the left pelvis likely fibroids. Interval right hip hemiarthroplasty with normal alignment. Gas in the soft tissues consistent with recent operative status. IMPRESSION: Status post right hip hemiarthroplasty with expected postsurgical changes. Probable calcified fibroids in the pelvis Electronically Signed   By: Jasmine Pang M.D.   On: 09/20/2017 20:09   Dg Hand Complete Right  Result Date: 09/19/2017 CLINICAL DATA:  Fall.  Wrist injury. EXAM: RIGHT HAND - COMPLETE 3+ VIEW COMPARISON:  No prior. FINDINGS: Comminuted displaced overriding fractures are noted of the distal radius and ulna. Diffuse osteopenia degenerative change. No radiopaque foreign body. Peripheral vascular calcification.  IMPRESSION: 1. Displaced comminuted angulated fractures of the distal radius and ulna. 2.  Peripheral vascular disease. Electronically Signed   By: Maisie Fus  Register   On: 09/19/2017 11:49   Dg C-arm 1-60 Min  Result Date: 09/20/2017 CLINICAL DATA:  Status post total hip replacement on the right for fracture EXAM: DG C-ARM 61-120 MIN; OPERATIVE RIGHT HIP WITH PELVIS COMPARISON:  Preoperative pelvis and right hip images Sep 19, 2017 FLUOROSCOPY TIME:  0 minutes 8 seconds; 5 acquired images FINDINGS: A series of frontal images were obtained showing total hip replacement on the right. Prosthetic components appear well seated on frontal view. Postoperatively, no fracture or dislocation is seen. IMPRESSION: Total hip replacement on the right with prosthetic components appearing well-seated on frontal view. No acute fracture or dislocation postoperatively. Electronically Signed   By: Bretta Bang III M.D.   On: 09/20/2017  16:24   Dg Hip Operative Unilat W Or W/o Pelvis Right  Result Date: 09/20/2017 CLINICAL DATA:  Status post total hip replacement on the right for fracture EXAM: DG C-ARM 61-120 MIN; OPERATIVE RIGHT HIP WITH PELVIS COMPARISON:  Preoperative pelvis and right hip images Sep 19, 2017 FLUOROSCOPY TIME:  0 minutes 8 seconds; 5 acquired images FINDINGS: A series of frontal images were obtained showing total hip replacement on the right. Prosthetic components appear well seated on frontal view. Postoperatively, no fracture or dislocation is seen. IMPRESSION: Total hip replacement on the right with prosthetic components appearing well-seated on frontal view. No acute fracture or dislocation postoperatively. Electronically Signed   By: Bretta Bang III M.D.   On: 09/20/2017 16:24   Dg Hip Unilat With Pelvis 2-3 Views Right  Result Date: 09/19/2017 CLINICAL DATA:  Right hip pain after fall. EXAM: DG HIP (WITH OR WITHOUT PELVIS) 2-3V RIGHT COMPARISON:  CT abdomen pelvis dated November 13, 2016.  FINDINGS: Acute, displaced fracture through the right femoral neck. No dislocation. The left hip is unremarkable. The pubic symphysis and sacroiliac joints are intact. Osteopenia. Unchanged calcifications in the left pelvis associated with known large dermoid. IMPRESSION: 1. Acute, displaced right femoral neck fracture. Electronically Signed   By: Obie Dredge M.D.   On: 09/19/2017 11:47     LOS: 2 days   Jeoffrey Massed, MD  [facility]  If 7PM-7AM, please contact night-coverage  Please page via [email or web address]-Password [email or web address]-click on MD name and type text message  09/21/2017, 3:45 PM

## 2017-09-21 NOTE — Progress Notes (Signed)
Subjective: 1 Day Post-Op Procedure(s) (LRB): RIGHT ARTHROPLASTY BIPOLAR HIP (HEMIARTHROPLASTY) (Right) OPEN REDUCTION INTERNAL FIXATION (ORIF) DISTAL RADIAL FRACTURE, possible Ulna Fracture ORIF (Right) IRRIGATION AND DEBRIDEMENT Open Wrist Fracture (Right) Patient reports pain as 4 on 0-10 scale.  Tolerating PO's. Resting in Chair. Limited PT. Denies SOB or CP.  Objective: Vital signs in last 24 hours: Temp:  [97.6 F (36.4 C)-101.1 F (38.4 C)] 99.8 F (37.7 C) (05/25 0803) Pulse Rate:  [85-131] 108 (05/25 0803) Resp:  [14-17] 14 (05/24 1756) BP: (82-144)/(50-69) 127/65 (05/25 0803) SpO2:  [90 %-100 %] 91 % (05/25 0346)  Intake/Output from previous day: 05/24 0701 - 05/25 0700 In: 900 [I.V.:900] Out: 1175 [Urine:1025; Blood:150] Intake/Output this shift: No intake/output data recorded.  Recent Labs    09/19/17 1058 09/20/17 0429 09/21/17 0640  HGB 13.6 12.5 9.5*   Recent Labs    09/20/17 0429 09/21/17 0640  WBC 11.0* 13.7*  RBC 4.65 3.53*  HCT 39.3 30.0*  PLT 166 142*   Recent Labs    09/20/17 0429 09/21/17 0640  NA 142 137  K 3.5 3.6  CL 107 104  CO2 25 22  BUN 9 19  CREATININE 0.78 1.22*  GLUCOSE 122* 136*  CALCIUM 8.4* 7.8*   Recent Labs    09/19/17 1205  INR 1.08    Well nourished. Alert and oriented x3. RRR, Lungs clear, BS x4. Abdomen soft and non tender. Right Calf soft and non tender. Right hip dressing C/D/I. No DVT signs. Compartment soft. No signs of infection.  Right LE grossly neurovascular intact. Right arm in sling.  Anticipated LOS equal to or greater than 2 midnights due to - Age 82 and older with one or more of the following:  - Obesity  - Expected need for hospital services (PT, OT, Nursing) required for safe  discharge  - Anticipated need for postoperative skilled nursing care or inpatient rehab  - Active co-morbidities: None OR   - Unanticipated findings during/Post Surgery: None  - Patient is a high risk of re-admission  due to: None   Assessment/Plan: 1 Day Post-Op Procedure(s) (LRB): RIGHT ARTHROPLASTY BIPOLAR HIP (HEMIARTHROPLASTY) (Right) OPEN REDUCTION INTERNAL FIXATION (ORIF) DISTAL RADIAL FRACTURE, possible Ulna Fracture ORIF (Right) IRRIGATION AND DEBRIDEMENT Open Wrist Fracture (Right) Advance diet Up with therapy D/C IV fluids Discharge to SNF  On Eliquis On medicine service  Right wrist fracture: Dr.Kuzma managing     Rebecca [name] 09/21/2017, 9:21 AM

## 2017-09-21 NOTE — Evaluation (Signed)
Physical Therapy Evaluation Patient Details Name: Rebecca [name] MRN: [id number] DOB: 05-Nov-1925 Today's Date: 09/21/2017   History of Present Illness  Rebecca [name] is a pleasantly demented 84 y.o. female with PHMx significant PAF, HTN, dementia, aortic stenosis s/p TAVR presents to the ER from facility via EMS chief complaint right hip pain right wrist pain after a fall. Pt found to have right hip femoral neck fx and right wrist fx. Pt now s/p right hip hemiarthroplasty, anterior approach and ORIF rigth distal radius and reduction right distal ulna without fixation    Clinical Impression  Pt presented supine in bed with HOB elevated, awake and willing to participate in therapy session. Pt currently requires max-total A x2 for bed mobility and transfers. Pt would continue to benefit from skilled physical therapy services at this time while admitted and after d/c to address the below listed limitations in order to improve overall safety and independence with functional mobility.     Follow Up Recommendations SNF;Supervision/Assistance - 24 hour    Equipment Recommendations  None recommended by PT    Recommendations for Other Services       Precautions / Restrictions Precautions Precautions: Fall Required Braces or Orthoses: Sling Restrictions Weight Bearing Restrictions: Yes RUE Weight Bearing: Weight bear through elbow only RLE Weight Bearing: Weight bearing as tolerated      Mobility  Bed Mobility Overal bed mobility: Needs Assistance Bed Mobility: Supine to Sit     Supine to sit: Total assist;+2 for physical assistance     General bed mobility comments: use of bed pads  Transfers Overall transfer level: Needs assistance Equipment used: 1 person hand held assist Transfers: Sit to/from Stand;Stand Pivot Transfers Sit to Stand: Max assist;+2 physical assistance Stand pivot transfers: Max assist;+2 physical assistance       General transfer comment: 2 person A with  gait belt and bed pad  Ambulation/Gait                Stairs            Wheelchair Mobility    Modified Rankin (Stroke Patients Only)       Balance Overall balance assessment: Needs assistance Sitting-balance support: Single extremity supported;Feet supported Sitting balance-Leahy Scale: Poor Sitting balance - Comments: reliant on single UE support and support by therapist Postural control: Posterior lean Standing balance support: Single extremity supported Standing balance-Leahy Scale: Zero Standing balance comment: reliant on single UE support and support by 2 therapists                             Pertinent Vitals/Pain Pain Assessment: Faces Faces Pain Scale: Hurts little more Pain Location: right LE Pain Descriptors / Indicators: Grimacing;Sore Pain Intervention(s): Monitored during session;Repositioned    Home Living Family/patient expects to be discharged to:: Assisted living                 Additional Comments: Memory care facility    Prior Function Level of Independence: Needs assistance   Gait / Transfers Assistance Needed: Per pt she was ambulatory without AD at facility           Hand Dominance   Dominant Hand: Right    Extremity/Trunk Assessment   Upper Extremity Assessment Upper Extremity Assessment: Defer to OT evaluation RUE Deficits / Details: s/p surgery for distal fx; elbow and wrist blocked by casting--can move fingers within constraints of casting    Lower Extremity Assessment Lower Extremity Assessment:  RLE deficits/detail RLE Deficits / Details: pt with decreased strength and AROM secondary to post-op pain. Pt with sensation grossly intact       Communication   Communication: No difficulties  Cognition Arousal/Alertness: Awake/alert Behavior During Therapy: WFL for tasks assessed/performed Overall Cognitive Status: History of cognitive impairments - at baseline                                         General Comments      Exercises     Assessment/Plan    PT Assessment Patient needs continued PT services  PT Problem List Decreased strength;Decreased range of motion;Decreased activity tolerance;Decreased balance;Decreased mobility;Decreased coordination;Decreased cognition;Decreased knowledge of use of DME;Decreased safety awareness;Decreased knowledge of precautions;Pain       PT Treatment Interventions DME instruction;Gait training;Stair training;Functional mobility training;Therapeutic exercise;Balance training;Therapeutic activities;Neuromuscular re-education;Cognitive remediation;Patient/family education    PT Goals (Current goals can be found in the Care Plan section)  Acute Rehab PT Goals Patient Stated Goal: agreed to get up in recliner once seated at EOB PT Goal Formulation: With patient Time For Goal Achievement: 10/05/17 Potential to Achieve Goals: Fair    Frequency Min 3X/week   Barriers to discharge        Co-evaluation PT/OT/SLP Co-Evaluation/Treatment: Yes Reason for Co-Treatment: To address functional/ADL transfers;For patient/therapist safety PT goals addressed during session: Mobility/safety with mobility;Balance;Proper use of DME;Strengthening/ROM OT goals addressed during session: Strengthening/ROM       AM-PAC PT "6 Clicks" Daily Activity  Outcome Measure Difficulty turning over in bed (including adjusting bedclothes, sheets and blankets)?: Unable Difficulty moving from lying on back to sitting on the side of the bed? : Unable Difficulty sitting down on and standing up from a chair with arms (e.g., wheelchair, bedside commode, etc,.)?: Unable Help needed moving to and from a bed to chair (including a wheelchair)?: Total Help needed walking in hospital room?: Total Help needed climbing 3-5 steps with a railing? : Total 6 Click Score: 6    End of Session Equipment Utilized During Treatment: Gait belt;Other (comment)(R UE  sling) Activity Tolerance: Patient limited by pain;Patient limited by fatigue Patient left: in chair;with call bell/phone within reach;with chair alarm set Nurse Communication: Mobility status;Need for lift equipment PT Visit Diagnosis: Other abnormalities of gait and mobility (R26.89);Pain Pain - Right/Left: Right Pain - part of body: Hip    Time: 5621-3086 PT Time Calculation (min) (ACUTE ONLY): 27 min   Charges:   PT Evaluation $PT Eval Moderate Complexity: 1 Mod     PT G Codes:        [place], PT, DPT [phone]   Alessandra Bevels Dorothye Berni 09/21/2017, 11:48 AM

## 2017-09-21 NOTE — Evaluation (Signed)
Occupational Therapy Evaluation and Discharge Patient Details Name: Rebecca [name] MRN: [id number] DOB: 10/17/1925 Today's Date: 09/21/2017    History of Present Illness Rebecca [name] is a pleasantly demented 84 y.o. female with PHMx significant PAF, HTN, dementia, aortic stenosis s/p TAVR presents to the ER from facility via EMS chief complaint right hip pain right wrist pain after a fall. Pt found to have right hip femoral neck fx and right wrist fx. Pt now s/p right hip hemiarthroplasty, anterior approach and ORIF rigth distal radius and reduction right distal ulna without fixation   Clinical Impression   This 84 yo female admitted and underwent above presents to acute OT with decreased balance, decreased mobility, prior cognitive deficits, unable to use her RLE, and increased pain RLE all affecting her safety and independence with basic ADLs. She will benefit from continued OT at SNF, will defer remainder of OT to that facility.     Follow Up Recommendations  Supervision/Assistance - 24 hour    Equipment Recommendations  None recommended by OT       Precautions / Restrictions Precautions Precautions: Fall;Anterior Hip Required Braces or Orthoses: Sling Restrictions Weight Bearing Restrictions: Yes RUE Weight Bearing: Weight bear through elbow only RLE Weight Bearing: Weight bearing as tolerated      Mobility Bed Mobility Overal bed mobility: Needs Assistance Bed Mobility: Supine to Sit     Supine to sit: Total assist;+2 for physical assistance        Transfers Overall transfer level: Needs assistance   Transfers: Sit to/from Stand;Stand Pivot Transfers Sit to Stand: Max assist;+2 physical assistance Stand pivot transfers: Max assist;+2 physical assistance       General transfer comment: 2 person A with gait belt and bed pad    Balance Overall balance assessment: Needs assistance Sitting-balance support: Single extremity supported;Feet supported Sitting  balance-Leahy Scale: Poor Sitting balance - Comments: reliant on single UE support and support by therapist Postural control: Posterior lean Standing balance support: Single extremity supported Standing balance-Leahy Scale: Zero Standing balance comment: reliant on single UE support and support by 2 therapists                           ADL either performed or assessed with clinical judgement   ADL Overall ADL's : Needs assistance/impaired Eating/Feeding: Set up Eating/Feeding Details (indicate cue type and reason): supported sitting Grooming: Moderate assistance Grooming Details (indicate cue type and reason): supported sitting Upper Body Bathing: Moderate assistance Upper Body Bathing Details (indicate cue type and reason): supported sitting Lower Body Bathing: Total assistance Lower Body Bathing Details (indicate cue type and reason): max A +2 sit<>stand Upper Body Dressing : Total assistance Upper Body Dressing Details (indicate cue type and reason): supported sitting Lower Body Dressing: Total assistance Lower Body Dressing Details (indicate cue type and reason): max A +2 sit<>stand Toilet Transfer: Maximal assistance;+2 for physical assistance;Stand-pivot Toilet Transfer Details (indicate cue type and reason): bed>recliner going to left Toileting- Clothing Manipulation and Hygiene: Total assistance Toileting - Clothing Manipulation Details (indicate cue type and reason): max A +2 sit<>stand             Vision Patient Visual Report: No change from baseline              Pertinent Vitals/Pain Pain Assessment: Faces Faces Pain Scale: Hurts little more Pain Location: right LE Pain Descriptors / Indicators: Grimacing;Sore Pain Intervention(s): Limited activity within patient's tolerance;Monitored during session;Repositioned     Hand  Dominance Right   Extremity/Trunk Assessment Upper Extremity Assessment Upper Extremity Assessment: RUE deficits/detail RUE  Deficits / Details: s/p surgery for distal fx; elbow and wrist blocked by casting--can move fingers within constraints of casting           Communication Communication Communication: No difficulties   Cognition Arousal/Alertness: Awake/alert Behavior During Therapy: WFL for tasks assessed/performed Overall Cognitive Status: History of cognitive impairments - at baseline                                                Home Living Family/patient expects to be discharged to:: Assisted living                                 Additional Comments: Memory care facility      Prior Functioning/Environment Level of Independence: Needs assistance  Gait / Transfers Assistance Needed: Per pt she was ambulatory without AD at facility              OT Problem List: Decreased strength;Decreased range of motion;Impaired balance (sitting and/or standing);Pain;Decreased cognition;Decreased knowledge of use of DME or AE         OT Goals(Current goals can be found in the care plan section) Acute Rehab OT Goals Patient Stated Goal: agreed to get up in recliner once seated at EOB  OT Frequency:             Co-evaluation PT/OT/SLP Co-Evaluation/Treatment: Yes Reason for Co-Treatment: Complexity of the patient's impairments (multi-system involvement);For patient/therapist safety   OT goals addressed during session: Strengthening/ROM      AM-PAC PT "6 Clicks" Daily Activity     Outcome Measure Help from another person eating meals?: A Little Help from another person taking care of personal grooming?: A Lot Help from another person toileting, which includes using toliet, bedpan, or urinal?: Total Help from another person bathing (including washing, rinsing, drying)?: A Lot Help from another person to put on and taking off regular upper body clothing?: Total Help from another person to put on and taking off regular lower body clothing?: Total 6 Click Score:  10   End of Session Equipment Utilized During Treatment: Gait belt Nurse Communication: Mobility status;Need for lift equipment(purewick needs to be replaced)  Activity Tolerance: Patient tolerated treatment well Patient left: in chair;with call bell/phone within reach;with chair alarm set  OT Visit Diagnosis: Unsteadiness on feet (R26.81);Other abnormalities of gait and mobility (R26.89);Muscle weakness (generalized) (M62.81);Other symptoms and signs involving cognitive function;Pain Pain - Right/Left: Right Pain - part of body: Leg                Time: 1610-9604 OT Time Calculation (min): 30 min Charges:  OT General Charges $OT Visit: 1 Visit OT Evaluation $OT Eval Moderate Complexity: [address] [place] [phone] 09/21/2017

## 2017-09-22 LAB — BASIC METABOLIC PANEL
ANION GAP: 10 (ref 5–15)
BUN: 19 mg/dL (ref 6–20)
CALCIUM: 7.4 mg/dL — AB (ref 8.9–10.3)
CO2: 22 mmol/L (ref 22–32)
Chloride: 104 mmol/L (ref 101–111)
Creatinine, Ser: 0.93 mg/dL (ref 0.44–1.00)
GFR calc Af Amer: >60 mL/min (ref >60)
GFR, EST NON AFRICAN AMERICAN: 53 mL/min — AB (ref >60)
Glucose, Bld: 109 mg/dL — ABNORMAL HIGH (ref 65–99)
POTASSIUM: 3.4 mmol/L — AB (ref 3.5–5.1)
SODIUM: 136 mmol/L (ref 135–145)

## 2017-09-22 LAB — CBC
HCT: 27.8 % — ABNORMAL LOW (ref 36.0–46.0)
HEMOGLOBIN: 8.6 g/dL — AB (ref 12.0–15.0)
MCH: 26.2 pg (ref 26.0–34.0)
MCHC: 30.9 g/dL (ref 30.0–36.0)
MCV: 84.8 fL (ref 78.0–100.0)
PLATELETS: 130 10*3/uL — AB (ref 150–400)
RBC: 3.28 MIL/uL — ABNORMAL LOW (ref 3.87–5.11)
RDW: 15.2 % (ref 11.5–15.5)
WBC: 9.9 10*3/uL (ref 4.0–10.5)

## 2017-09-22 MED ORDER — POTASSIUM CHLORIDE CRYS ER 20 MEQ PO TBCR
40.0000 meq | EXTENDED_RELEASE_TABLET | Freq: Once | ORAL | Status: AC
  Administered 2017-09-22: 40 meq via ORAL
  Filled 2017-09-22: qty 2

## 2017-09-22 MED ORDER — TRAMADOL HCL 50 MG PO TABS: 50.0000 mg | ORAL_TABLET | Freq: Four times a day (QID) | ORAL | 0 refills | Status: AC | PRN

## 2017-09-22 NOTE — Clinical Social Work Note (Signed)
PASRR is pending.   [place], [place] [phone]

## 2017-09-22 NOTE — NC FL2 (Signed)
[place] MEDICAID FL2 LEVEL OF CARE SCREENING TOOL     IDENTIFICATION  Patient Name: Rebecca [name] Birthdate: Dec 11, 1925 Sex: female Admission Date (Current Location): 09/19/2017  [facility] and [place] Number:  Producer, television/film/video and Address:  [facility], [address] [place], [place] [address]      Provider Number: [id number]  Attending Physician Name and Address:  Leroy Sea, MD  Relative Name and Phone Number:  Shea Evans [phone]    Current Level of Care: Hospital Recommended Level of Care: Skilled Nursing Facility Prior Approval Number:    Date Approved/Denied:   PASRR Number: pending  Discharge Plan: SNF    Current Diagnoses: Patient Active Problem List   Diagnosis Date Noted  . Displaced fracture of right femoral neck (HCC) 09/20/2017  . Closed right hip fracture, initial encounter (HCC) 09/19/2017  . Right wrist fracture, open, initial encounter 09/19/2017  . Sepsis (HCC)   . HCAP (healthcare-associated pneumonia) 11/12/2016  . Hypokalemia 11/12/2016  . Thrombocytopenia (HCC) 11/12/2016  . E coli bacteremia 06/13/2016  . Sepsis secondary to UTI (HCC) 06/11/2016  . Moderate episode of recurrent major depressive disorder (HCC)   . Sleep-wake disorder   . Somnolence   . Decreased appetite   . Goals of care, counseling/discussion   . Palliative care by specialist   . Advance care planning   . Pre-syncope 04/16/2016  . Confusion 04/15/2016  . MDD (major depressive disorder), single episode, moderate (HCC) 07/04/2015  . S/P TAVR (transcatheter aortic valve replacement) 02/02/2014  . Severe aortic stenosis 02/02/2014  . Sinus tachycardia 12/04/2013  . Sinus bradycardia 12/04/2013  . Encounter for therapeutic drug monitoring 06/17/2013  . Dementia 11/06/2012  . PAF (paroxysmal atrial fibrillation) (HCC) 10/03/2012  . Long term (current) use of anticoagulants 10/03/2012  . Syncope 08/21/2012  . Aortic stenosis,  severe 07/09/2012  . Near syncope 07/08/2012  . BRONCHIECTASIS 12/09/2007  . SINUSITIS, CHRONIC 07/07/2007  . BRONCHITIS, CHRONIC 07/07/2007  . HYPERLIPIDEMIA 06/09/2007  . Essential hypertension 06/09/2007  . ALLERGIC RHINITIS 06/09/2007  . OSTEOPOROSIS 06/09/2007    Orientation RESPIRATION BLADDER Height & Weight     Self  O2(Nasal Cannula 2L) Incontinent Weight: 136 lb 14.5 oz (62.1 kg) Height:   (160 cm)  BEHAVIORAL SYMPTOMS/MOOD NEUROLOGICAL BOWEL NUTRITION STATUS  [place]   Continent Diet(Heart Healthy, Thin liquids.)  AMBULATORY STATUS COMMUNICATION OF NEEDS Skin   Limited Assist Verbally Surgical wounds(Right Hip, Silver hydrofiber dressing. Right Hand Compression wrap dressing.)                       Personal Care Assistance Level of Assistance  Bathing, Feeding, Dressing Bathing Assistance: Limited assistance Feeding assistance: Limited assistance Dressing Assistance: Limited assistance     Functional Limitations Info  Sight, Hearing, Speech Sight Info: Adequate Hearing Info: Adequate Speech Info: Adequate    SPECIAL CARE FACTORS FREQUENCY  PT (By licensed PT), OT (By licensed OT)     PT Frequency: 3x OT Frequency: 3x            Contractures Contractures Info: Not present    Additional Factors Info  Code Status, Allergies Code Status Info: DNR Allergies Info: No Known Allergies           Current Medications (09/22/2017):  This is the current hospital active medication list Current Facility-Administered Medications  Medication Dose Route Frequency Provider Last Rate Last Dose  . 0.9 %  sodium chloride infusion   Intravenous Continuous  Maretta Bees, MD   Stopped at 09/22/17 0732  . acetaminophen (TYLENOL) tablet 1,000 mg  1,000 mg Oral Q8H Ghimire, Werner Lean, MD   1,000 mg at 09/22/17 0744  . apixaban (ELIQUIS) tablet 2.5 mg  2.5 mg Oral BID Samson Frederic, MD   2.5 mg at 09/22/17 0745  . fentaNYL (SUBLIMAZE) injection 25-50 mcg   25-50 mcg Intravenous Q5 min PRN Gaynelle Adu, MD      . memantine [facility]) tablet 10 mg  10 mg Oral BID Maretta Bees, MD   10 mg at 09/22/17 0744  . methocarbamol (ROBAXIN) tablet 500 mg  500 mg Oral Q6H PRN Samson Frederic, MD   500 mg at 09/22/17 0744  . mirtazapine (REMERON) tablet 30 mg  30 mg Oral QHS Ghimire, Shanker M, MD      . ondansetron [facility]) injection 4 mg  4 mg Intravenous Q6H PRN Swinteck, Arlys John, MD      . ondansetron (ZOFRAN) injection 4 mg  4 mg Intravenous Q6H PRN Swinteck, Arlys John, MD      . polyethylene glycol (MIRALAX / GLYCOLAX) packet 17 g  17 g Oral Daily PRN Swinteck, Arlys John, MD      . polyethylene glycol (MIRALAX / GLYCOLAX) packet 17 g  17 g Oral Daily Maretta Bees, MD   17 g at 09/22/17 0744  . sertraline (ZOLOFT) tablet 100 mg  100 mg Oral Q breakfast Maretta Bees, MD   100 mg at 09/22/17 0745  . traMADol (ULTRAM) tablet 50 mg  50 mg Oral Q6H PRN Maretta Bees, MD   50 mg at 09/22/17 0745  . trimethoprim (TRIMPEX) tablet 100 mg  100 mg Oral QHS Ghimire, Werner Lean, MD         Discharge Medications: Please see discharge summary for a list of discharge medications.  Relevant Imaging Results:  Relevant Lab Results:   Additional Information SSN: [id number]  Maree Krabbe, LCSW

## 2017-09-22 NOTE — Anesthesia Postprocedure Evaluation (Signed)
Anesthesia Post Note  Patient: Rebecca [name]  Procedure(s) Performed: RIGHT ARTHROPLASTY BIPOLAR HIP (HEMIARTHROPLASTY) (Right Hip) OPEN REDUCTION INTERNAL FIXATION (ORIF) DISTAL RADIAL FRACTURE, possible Ulna Fracture ORIF (Right ) IRRIGATION AND DEBRIDEMENT Open Wrist Fracture (Right )     Patient location during evaluation: PACU Anesthesia Type: General Level of consciousness: awake and alert Pain management: pain level controlled Vital Signs Assessment: post-procedure vital signs reviewed and stable Respiratory status: spontaneous breathing, nonlabored ventilation, respiratory function stable and patient connected to nasal cannula oxygen Cardiovascular status: blood pressure returned to baseline and stable Postop Assessment: no apparent nausea or vomiting Anesthetic complications: no    Last Vitals:  Vitals:   09/22/17 0416 09/22/17 1357  BP: 135/61 138/77  Pulse: (!) 102 97  Resp: 17   Temp: 37 C 37.1 C  SpO2: 91% (!) 89%    Last Pain:  Vitals:   09/22/17 1357  TempSrc: Oral  PainSc:                  Beryle Lathe

## 2017-09-22 NOTE — Progress Notes (Signed)
     Subjective: 2 Days Post-Op Procedure(s) (LRB): RIGHT ARTHROPLASTY BIPOLAR HIP (HEMIARTHROPLASTY) (Right) OPEN REDUCTION INTERNAL FIXATION (ORIF) DISTAL RADIAL FRACTURE, possible Ulna Fracture ORIF (Right) IRRIGATION AND DEBRIDEMENT Open Wrist Fracture (Right)   Patient reports pain as mild, pain controlled. She reports that the hip and wrist are "fine". No reported events throughout the night. Tolerating PO's. Resting in bed. Denies SOB or CP.   Objective:   VITALS:   Vitals:   09/21/17 2045 09/22/17 0416  BP: 121/83 135/61  Pulse: 90 (!) 102  Resp: 17 17  Temp: 97.9 F (36.6 C) 98.6 F (37 C)  SpO2: 100% 91%    Dorsiflexion/Plantar flexion intact No cellulitis present Compartment soft  LABS Recent Labs    09/20/17 0429 09/21/17 0640 09/22/17 0509  HGB 12.5 9.5* 8.6*  HCT 39.3 30.0* 27.8*  WBC 11.0* 13.7* 9.9  PLT 166 142* 130*    Recent Labs    09/20/17 0429 09/21/17 0640 09/22/17 0509  NA 142 137 136  K 3.5 3.6 3.4*  BUN CREATININE 0.78 1.22* 0.93  GLUCOSE 122* 136* 109*     Assessment/Plan: 2 Days Post-Op Procedure(s) (LRB): RIGHT ARTHROPLASTY BIPOLAR HIP (HEMIARTHROPLASTY) (Right) OPEN REDUCTION INTERNAL FIXATION (ORIF) DISTAL RADIAL FRACTURE, possible Ulna Fracture ORIF (Right) IRRIGATION AND DEBRIDEMENT Open Wrist Fracture (Right)  Up with therapy Discharge to SNF  On Eliquis On medicine service  Right wrist fracture: Dr.Kuzma managing      Anastasio Auerbach. Vinetta Brach   PAC  09/22/2017, 10:29 AM

## 2017-09-22 NOTE — Progress Notes (Signed)
PROGRESS NOTE        PATIENT DETAILS Name: Rebecca [name] Age: 84 y.o. Sex: female Date of Birth: June 03, 1925 Admit Date: 09/19/2017 Admitting Physician Jonah Blue, MD WUJ:WJXB, Darlen Round, MD  Brief Narrative: Patient is a 84 y.o. female with history of PAF on Eliquis, severe aortic stenosis status post TAVR-presented to the ED following a mechanical fall, she was found to have right hip fracture and a right open wrist fracture.  She was seen by hand surgery and orthopedics, and underwent surgical repair on 5/24.  See below for further details.  Subjective:  Patient in bed, appears comfortable, denies any headache, no fever, no chest pain or pressure, no shortness of breath , no abdominal pain. No focal weakness.  Is pleasantly confused but able to answer questions and follow commands appropriately.  Assessment/Plan:  Closed right hip fracture and right distal radius and ulna fractures secondary to mechanical fall:  In by orthopedics and underwent surgical repair on 09/20/2017, weightbearing as tolerated, home dose Eliquis continued for DVT prophylaxis.  In by PT will require placement to SNF, medically stable will be discharged once bed is available.  Will perioperative blood loss related anemia not requiring transfusion will monitor.  Anemia: See above  Fever: One episode of fever morning of 09/21/2017 resolved.  No signs of infection currently.  Mild AKI: Likely hemodynamically mediated-continue supportive care-recheck electrolytes tomorrow  PAF:  Rate controlled without use of any rate control medications, Eliquis is been resumed.  Follow.  Severe AS-status post TAVR - currently stable  .  Chronic diastolic heart failure:  Compensated-no role for diuretics at this time.  Hypertension: Blood pressure currently controlled without the use of any antihypertensives.  Follow for now.   Depression:  Resume Zoloft and Remeron.  Dementia.  Presently  confused-suspect she is at her usual baseline-resume Namenda.    DVT Prophylaxis: Full dose anticoagulation with Eliquis  Code Status: DNR  Family Communication: None at bedside  Disposition Plan: Remain inpatient- SNF on discharge-sometime early next week  Antimicrobial agents: Anti-infectives (From admission, onward)   Start     Dose/Rate Route Frequency Ordered Stop   09/21/17 2200  trimethoprim (TRIMPEX) tablet 100 mg     100 mg Oral Daily at bedtime 09/21/17 1551     09/20/17 1815  ceFAZolin (ANCEF) IVPB 2g/100 mL premix     2 g 200 mL/hr over 30 Minutes Intravenous Every 6 hours 09/20/17 1815 09/21/17 0614   09/20/17 0815  ceFAZolin (ANCEF) IVPB 2g/100 mL premix  Status:  Discontinued     2 g 200 mL/hr over 30 Minutes Intravenous On call to O.R. 09/20/17 0801 09/20/17 1745   09/20/17 0600  ceFAZolin (ANCEF) IVPB 2g/100 mL premix     2 g 200 mL/hr over 30 Minutes Intravenous On call to O.R. 09/19/17 1450 09/20/17 1335   09/19/17 1215  ceFAZolin (ANCEF) IVPB 1 g/50 mL premix     1 g 100 mL/hr over 30 Minutes Intravenous  Once 09/19/17 1207 09/19/17 1409      Procedures: 5/24>> 1. ORIF Right extraarticular distal radius fracture 2. Irrigation and debridement open right distal ulna fracture including skin, subcutaneous tissue, and tissue adjacent to bone 3. Reduction right distal ulna fracture without fixation 4.RIGHT ARTHROPLASTY BIPOLAR HIP  CONSULTS:  ID  Time spent: 25- minutes-Greater than 50% of this time was spent in  counseling, explanation of diagnosis, planning of further management, and coordination of care.  MEDICATIONS: Scheduled Meds: . acetaminophen  1,000 mg Oral Q8H  . apixaban  2.5 mg Oral BID  . memantine  10 mg Oral BID  . mirtazapine  30 mg Oral QHS  . polyethylene glycol  17 g Oral Daily  . sertraline  100 mg Oral Q breakfast  . trimethoprim  100 mg Oral QHS   Continuous Infusions: . sodium chloride Stopped (09/22/17 0732)   PRN  Meds:.fentaNYL (SUBLIMAZE) injection, methocarbamol **OR** [DISCONTINUED] methocarbamol (ROBAXIN)  IV, ondansetron (ZOFRAN) IV, [DISCONTINUED] ondansetron **OR** ondansetron (ZOFRAN) IV, polyethylene glycol, traMADol   PHYSICAL EXAM: Vital signs: Vitals:   09/21/17 0803 09/21/17 1614 09/21/17 2045 09/22/17 0416  BP: 127/65 113/61 121/83 135/61  Pulse: (!) 108 91 90 (!) 102  Resp:   17 17  Temp: 99.8 F (37.7 C) 98.3 F (36.8 C) 97.9 F (36.6 C) 98.6 F (37 C)  TempSrc: Oral Oral Oral Oral  SpO2:  91% 100% 91%  Weight:      Height:       Filed Weights   09/19/17 1554  Weight: 62.1 kg (136 lb 14.5 oz)   Body mass index is 24.25 kg/m.   Exam  Awake, minimally confused oriented x1, no new F.N deficits, Normal affect [place].AT,PERRAL Supple Neck,No JVD, No cervical lymphadenopathy appriciated.  Symmetrical Chest wall movement, Good air movement bilaterally, CTAB RRR,No Gallops, Rubs, aortic systolic murmur, No Parasternal Heave +ve B.Sounds, Abd Soft, No tenderness, No organomegaly appriciated, No rebound - guarding or rigidity. No Cyanosis, Clubbing or edema, No new Rash or bruise, right arm in sling, right hip postop site appears stable  I have personally reviewed following labs and imaging studies  LABORATORY DATA: CBC: Recent Labs  Lab 09/19/17 1058 09/20/17 0429 09/21/17 0640 09/22/17 0509  WBC 11.5* 11.0* 13.7* 9.9  NEUTROABS 7.5  --   --   --   HGB 13.6 12.5 9.5* 8.6*  HCT 42.3 39.3 30.0* 27.8*  MCV 83.9 84.5 85.0 84.8  PLT 217 166 142* 130*    Basic Metabolic Panel: Recent Labs  Lab 09/19/17 1205 09/20/17 0429 09/21/17 0640 09/22/17 0509  NA 142 142 137 136  K 3.7 3.5 3.6 3.4*  CL 105 107 104 104  CO2 GLUCOSE 145* 122* 136* 109*  BUN CREATININE 1.06* 0.78 1.22* 0.93  CALCIUM 8.7* 8.4* 7.8* 7.4*    GFR: Estimated Creatinine Clearance: 33.9 mL/min (by C-G formula based on SCr of 0.93 mg/dL).  Liver Function  Tests: Recent Labs  Lab 09/19/17 1205  AST 29  ALT 16  ALKPHOS 102  BILITOT 1.0  PROT 6.6  ALBUMIN 3.4*   No results for input(s): LIPASE, AMYLASE in the last 168 hours. No results for input(s): AMMONIA in the last 168 hours.  Coagulation Profile: Recent Labs  Lab 09/19/17 1205  INR 1.08    Cardiac Enzymes: No results for input(s): CKTOTAL, CKMB, CKMBINDEX, TROPONINI in the last 168 hours.  BNP (last 3 results) No results for input(s): PROBNP in the last 8760 hours.  HbA1C: No results for input(s): HGBA1C in the last 72 hours.  CBG: No results for input(s): GLUCAP in the last 168 hours.  Lipid Profile: No results for input(s): CHOL, HDL, LDLCALC, TRIG, CHOLHDL, LDLDIRECT in the last 72 hours.  Thyroid Function Tests: No results for input(s): TSH, T4TOTAL, FREET4, T3FREE, THYROIDAB in the last 72 hours.  Anemia  Panel: No results for input(s): VITAMINB12, FOLATE, FERRITIN, TIBC, IRON, RETICCTPCT in the last 72 hours.  Urine analysis:    Component Value Date/Time   COLORURINE YELLOW 11/12/2016 1845   APPEARANCEUR HAZY (A) 11/12/2016 1845   LABSPEC 1.006 11/12/2016 1845   PHURINE 6.0 11/12/2016 1845   GLUCOSEU NEGATIVE 11/12/2016 1845   HGBUR MODERATE (A) 11/12/2016 1845   BILIRUBINUR NEGATIVE 11/12/2016 1845   KETONESUR NEGATIVE 11/12/2016 1845   PROTEINUR 100 (A) 11/12/2016 1845   UROBILINOGEN 0.2 06/03/2014 0740   NITRITE POSITIVE (A) 11/12/2016 1845   LEUKOCYTESUR LARGE (A) 11/12/2016 1845    Sepsis Labs: Lactic Acid, Venous    Component Value Date/Time   LATICACIDVEN 1.54 11/12/2016 1655    MICROBIOLOGY: Recent Results (from the past 240 hour(s))  Surgical pcr screen     Status: None   Collection Time: 09/19/17  8:00 PM  Result Value Ref Range Status   MRSA, PCR NEGATIVE NEGATIVE Final   Staphylococcus aureus NEGATIVE NEGATIVE Final    Comment: (NOTE) The Xpert SA Assay (FDA approved for NASAL specimens in patients 69 years of age and  older), is one component of a comprehensive surveillance program. It is not intended to diagnose infection nor to guide or monitor treatment. Performed at [facility], [address] [place], [place] [address]     RADIOLOGY STUDIES/RESULTS: Dg Chest 1 View  Result Date: 09/19/2017 CLINICAL DATA:  Weakness. The patient suffered a right hip fracture due to a fall today. EXAM: CHEST  1 VIEW COMPARISON:  PA and lateral chest 11/12/2016. FINDINGS: The lungs are clear. Heart size is upper normal. Aortic atherosclerosis is identified. The patient is status post aortic valve repair. No pneumothorax or pleural effusion. No acute bony abnormality. IMPRESSION: No acute disease. Atherosclerosis. Electronically Signed   By: Drusilla Kanner M.D.   On: 09/19/2017 11:45   Dg Wrist Complete Right  Result Date: 09/19/2017 CLINICAL DATA:  Fall.  Wrist injury. EXAM: RIGHT WRIST - COMPLETE 3+ VIEW COMPARISON:  No recent prior. FINDINGS: Severely displaced comminuted distal right radial metaphyseal fracture is present. Displaced distal ulnar fracture is present. Prominent overlying of fracture fragments noted. Diffuse osteopenia and degenerative change. IMPRESSION: Severely displaced and comminuted distal right radial metaphyseal fracture. Displaced distal ulnar fracture is also present. Fracture fragments are overriding. Electronically Signed   By: Maisie Fus  Register   On: 09/19/2017 11:48   Pelvis Portable  Result Date: 09/20/2017 CLINICAL DATA:  Status post hip surgery EXAM: PORTABLE PELVIS 1-2 VIEWS COMPARISON:  09/19/2017 FINDINGS: Lobulated calcifications in the left pelvis likely fibroids. Interval right hip hemiarthroplasty with normal alignment. Gas in the soft tissues consistent with recent operative status. IMPRESSION: Status post right hip hemiarthroplasty with expected postsurgical changes. Probable calcified fibroids in the pelvis Electronically Signed   By: Jasmine Pang M.D.   On: 09/20/2017  20:09   Dg Hand Complete Right  Result Date: 09/19/2017 CLINICAL DATA:  Fall.  Wrist injury. EXAM: RIGHT HAND - COMPLETE 3+ VIEW COMPARISON:  No prior. FINDINGS: Comminuted displaced overriding fractures are noted of the distal radius and ulna. Diffuse osteopenia degenerative change. No radiopaque foreign body. Peripheral vascular calcification. IMPRESSION: 1. Displaced comminuted angulated fractures of the distal radius and ulna. 2.  Peripheral vascular disease. Electronically Signed   By: Maisie Fus  Register   On: 09/19/2017 11:49   Dg C-arm 1-60 Min  Result Date: 09/20/2017 CLINICAL DATA:  Status post total hip replacement on the right for fracture EXAM: DG C-ARM 61-120  MIN; OPERATIVE RIGHT HIP WITH PELVIS COMPARISON:  Preoperative pelvis and right hip images Sep 19, 2017 FLUOROSCOPY TIME:  0 minutes 8 seconds; 5 acquired images FINDINGS: A series of frontal images were obtained showing total hip replacement on the right. Prosthetic components appear well seated on frontal view. Postoperatively, no fracture or dislocation is seen. IMPRESSION: Total hip replacement on the right with prosthetic components appearing well-seated on frontal view. No acute fracture or dislocation postoperatively. Electronically Signed   By: Bretta Bang III M.D.   On: 09/20/2017 16:24   Dg Hip Operative Unilat W Or W/o Pelvis Right  Result Date: 09/20/2017 CLINICAL DATA:  Status post total hip replacement on the right for fracture EXAM: DG C-ARM 61-120 MIN; OPERATIVE RIGHT HIP WITH PELVIS COMPARISON:  Preoperative pelvis and right hip images Sep 19, 2017 FLUOROSCOPY TIME:  0 minutes 8 seconds; 5 acquired images FINDINGS: A series of frontal images were obtained showing total hip replacement on the right. Prosthetic components appear well seated on frontal view. Postoperatively, no fracture or dislocation is seen. IMPRESSION: Total hip replacement on the right with prosthetic components appearing well-seated on frontal  view. No acute fracture or dislocation postoperatively. Electronically Signed   By: Bretta Bang III M.D.   On: 09/20/2017 16:24   Dg Hip Unilat With Pelvis 2-3 Views Right  Result Date: 09/19/2017 CLINICAL DATA:  Right hip pain after fall. EXAM: DG HIP (WITH OR WITHOUT PELVIS) 2-3V RIGHT COMPARISON:  CT abdomen pelvis dated November 13, 2016. FINDINGS: Acute, displaced fracture through the right femoral neck. No dislocation. The left hip is unremarkable. The pubic symphysis and sacroiliac joints are intact. Osteopenia. Unchanged calcifications in the left pelvis associated with known large dermoid. IMPRESSION: 1. Acute, displaced right femoral neck fracture. Electronically Signed   By: Obie Dredge M.D.   On: 09/19/2017 11:47     LOS: 3 days    Signature  Susa Raring M.D on 09/22/2017 at 11:52 AM  Between 7am to 7pm - Pager - [phone] ( page via [email or web address], text pages only, please mention full 10 digit call back number).  After 7pm go to [email or web address] - password [facility]

## 2017-09-23 ENCOUNTER — Inpatient Hospital Stay (HOSPITAL_COMMUNITY): Payer: Medicare Other

## 2017-09-23 LAB — CBC
HCT: 31.5 % — ABNORMAL LOW (ref 36.0–46.0)
Hemoglobin: 9.7 g/dL — ABNORMAL LOW (ref 12.0–15.0)
MCH: 27 pg (ref 26.0–34.0)
MCHC: 30.8 g/dL (ref 30.0–36.0)
MCV: 87.7 fL (ref 78.0–100.0)
PLATELETS: 150 10*3/uL (ref 150–400)
RBC: 3.59 MIL/uL — ABNORMAL LOW (ref 3.87–5.11)
RDW: 15.4 % (ref 11.5–15.5)
WBC: 9.3 10*3/uL (ref 4.0–10.5)

## 2017-09-23 LAB — BASIC METABOLIC PANEL
Anion gap: 9 (ref 5–15)
BUN: 16 mg/dL (ref 6–20)
CHLORIDE: 107 mmol/L (ref 101–111)
CO2: 22 mmol/L (ref 22–32)
CREATININE: 0.75 mg/dL (ref 0.44–1.00)
Calcium: 8 mg/dL — ABNORMAL LOW (ref 8.9–10.3)
GFR calc Af Amer: >60 mL/min (ref >60)
GLUCOSE: 91 mg/dL (ref 65–99)
POTASSIUM: 3.8 mmol/L (ref 3.5–5.1)
SODIUM: 138 mmol/L (ref 135–145)

## 2017-09-23 NOTE — Progress Notes (Signed)
PROGRESS NOTE        PATIENT DETAILS Name: Rebecca [name] Age: 84 y.o. Sex: female Date of Birth: 1925-10-05 Admit Date: 09/19/2017 Admitting Physician Jonah Blue, MD AVW:UJWJ, Darlen Round, MD  Brief Narrative:  Patient is a 84 y.o. female with history of PAF on Eliquis, severe aortic stenosis status post TAVR-presented to the ED following a mechanical fall, she was found to have right hip fracture and a right open wrist fracture.  She was seen by hand surgery and orthopedics, and underwent surgical repair on 5/24.  See below for further details.  Subjective: Patient in bed, appears comfortable, denies any headache, no fever, no chest pain or pressure, no shortness of breath , no abdominal pain. No focal weakness.  Assessment/Plan:  Closed right hip fracture and right distal radius and ulna fractures secondary to mechanical fall:  In by orthopedics and underwent surgical repair on 09/20/2017, weightbearing as tolerated, home dose Eliquis continued for DVT prophylaxis.  She has been seen by physical therapy and qualifies for SNF bed, we await bed availability, social work working on it.  She did have mild perioperative blood loss related anemia not requiring transfusion will monitor.  Case discussed with orthopedic surgeon Dr. Ranell Patrick bedside on 09/23/2017.  Anemia: See above  Fever: One episode of fever morning of 09/21/2017 resolved.  No signs of infection currently.  Mild AKI: Likely hemodynamically mediated-resolved after gentle hydration with IV fluids.  PAF:  Rate controlled without use of any rate control medications, Eliquis is been resumed.  Follow.  Severe AS-status post TAVR - currently stable  .  Chronic diastolic heart failure:  Compensated-no role for diuretics at this time.  Hypertension: Blood pressure currently controlled without the use of any antihypertensives.  Follow for now.   Depression:  Resume Zoloft and Remeron.  Dementia.   Presently confused-suspect she is at her usual baseline-resume Namenda.    DVT Prophylaxis: Full dose anticoagulation with Eliquis  Code Status: DNR  Family Communication: None at bedside  Disposition Plan: Remain inpatient- SNF on discharge-sometime early next week  Antimicrobial agents: Anti-infectives (From admission, onward)   Start     Dose/Rate Route Frequency Ordered Stop   09/21/17 2200  trimethoprim (TRIMPEX) tablet 100 mg     100 mg Oral Daily at bedtime 09/21/17 1551     09/20/17 1815  ceFAZolin (ANCEF) IVPB 2g/100 mL premix     2 g 200 mL/hr over 30 Minutes Intravenous Every 6 hours 09/20/17 1815 09/21/17 0614   09/20/17 0815  ceFAZolin (ANCEF) IVPB 2g/100 mL premix  Status:  Discontinued     2 g 200 mL/hr over 30 Minutes Intravenous On call to O.R. 09/20/17 0801 09/20/17 1745   09/20/17 0600  ceFAZolin (ANCEF) IVPB 2g/100 mL premix     2 g 200 mL/hr over 30 Minutes Intravenous On call to O.R. 09/19/17 1450 09/20/17 1335   09/19/17 1215  ceFAZolin (ANCEF) IVPB 1 g/50 mL premix     1 g 100 mL/hr over 30 Minutes Intravenous  Once 09/19/17 1207 09/19/17 1409      Procedures: 5/24>> 1. ORIF Right extraarticular distal radius fracture 2. Irrigation and debridement open right distal ulna fracture including skin, subcutaneous tissue, and tissue adjacent to bone 3. Reduction right distal ulna fracture without fixation 4.RIGHT ARTHROPLASTY BIPOLAR HIP  CONSULTS:  ID  Time spent: 25- minutes-Greater than  50% of this time was spent in counseling, explanation of diagnosis, planning of further management, and coordination of care.  MEDICATIONS: Scheduled Meds: . acetaminophen  1,000 mg Oral Q8H  . apixaban  2.5 mg Oral BID  . memantine  10 mg Oral BID  . mirtazapine  30 mg Oral QHS  . polyethylene glycol  17 g Oral Daily  . sertraline  100 mg Oral Q breakfast  . trimethoprim  100 mg Oral QHS   Continuous Infusions:  PRN Meds:.fentaNYL (SUBLIMAZE) injection,  methocarbamol **OR** [DISCONTINUED] methocarbamol (ROBAXIN)  IV, ondansetron (ZOFRAN) IV, [DISCONTINUED] ondansetron **OR** ondansetron (ZOFRAN) IV, polyethylene glycol, traMADol   PHYSICAL EXAM: Vital signs: Vitals:   09/22/17 0416 09/22/17 1357 09/22/17 2216 09/23/17 0650  BP: 135/61 138/77 (!) 117/52 (!) 160/85  Pulse: (!) 102 97 82 89  Resp: 17     Temp: 98.6 F (37 C) 98.8 F (37.1 C) 98.9 F (37.2 C) 98.3 F (36.8 C)  TempSrc: Oral Oral Oral Oral  SpO2: 91% (!) 89% 91% 92%  Weight:      Height:       Filed Weights   09/19/17 1554  Weight: 62.1 kg (136 lb 14.5 oz)   Body mass index is 24.25 kg/m.   Exam  Awake Alert, Oriented X 2, No new F.N deficits, Normal affect Burna.AT,PERRAL Supple Neck,No JVD, No cervical lymphadenopathy appriciated.  Symmetrical Chest wall movement, Good air movement bilaterally, CTAB RRR,No Gallops, Rubs, No Parasternal Heave +ve B.Sounds, Abd Soft, No tenderness, No organomegaly appriciated, No rebound - guarding or rigidity. No Cyanosis, Clubbing or edema, No new Rash or bruise Positive aortic systolic murmur, right arm in sling with some swelling in the hand, right hip postop site appears stable  I have personally reviewed following labs and imaging studies  LABORATORY DATA: CBC: Recent Labs  Lab 09/19/17 1058 09/20/17 0429 09/21/17 0640 09/22/17 0509 09/23/17 0323  WBC 11.5* 11.0* 13.7* 9.9 9.3  NEUTROABS 7.5  --   --   --   --   HGB 13.6 12.5 9.5* 8.6* 9.7*  HCT 42.3 39.3 30.0* 27.8* 31.5*  MCV 83.9 84.5 85.0 84.8 87.7  PLT 217 166 142* 130* 150    Basic Metabolic Panel: Recent Labs  Lab 09/19/17 1205 09/20/17 0429 09/21/17 0640 09/22/17 0509 09/23/17 0323  NA 142 142 137 136 138  K 3.7 3.5 3.6 3.4* 3.8  CL 105 107 104 104 107  CO2 GLUCOSE 145* 122* 136* 109* 91  BUN CREATININE 1.06* 0.78 1.22* 0.93 0.75  CALCIUM 8.7* 8.4* 7.8* 7.4* 8.0*    GFR: Estimated Creatinine Clearance:  39.4 mL/min (by C-G formula based on SCr of 0.75 mg/dL).  Liver Function Tests: Recent Labs  Lab 09/19/17 1205  AST 29  ALT 16  ALKPHOS 102  BILITOT 1.0  PROT 6.6  ALBUMIN 3.4*   No results for input(s): LIPASE, AMYLASE in the last 168 hours. No results for input(s): AMMONIA in the last 168 hours.  Coagulation Profile: Recent Labs  Lab 09/19/17 1205  INR 1.08    Cardiac Enzymes: No results for input(s): CKTOTAL, CKMB, CKMBINDEX, TROPONINI in the last 168 hours.  BNP (last 3 results) No results for input(s): PROBNP in the last 8760 hours.  HbA1C: No results for input(s): HGBA1C in the last 72 hours.  CBG: No results for input(s): GLUCAP in the last 168 hours.  Lipid Profile: No results for input(s): CHOL, HDL,  LDLCALC, TRIG, CHOLHDL, LDLDIRECT in the last 72 hours.  Thyroid Function Tests: No results for input(s): TSH, T4TOTAL, FREET4, T3FREE, THYROIDAB in the last 72 hours.  Anemia Panel: No results for input(s): VITAMINB12, FOLATE, FERRITIN, TIBC, IRON, RETICCTPCT in the last 72 hours.  Urine analysis:    Component Value Date/Time   COLORURINE YELLOW 11/12/2016 1845   APPEARANCEUR HAZY (A) 11/12/2016 1845   LABSPEC 1.006 11/12/2016 1845   PHURINE 6.0 11/12/2016 1845   GLUCOSEU NEGATIVE 11/12/2016 1845   HGBUR MODERATE (A) 11/12/2016 1845   BILIRUBINUR NEGATIVE 11/12/2016 1845   KETONESUR NEGATIVE 11/12/2016 1845   PROTEINUR 100 (A) 11/12/2016 1845   UROBILINOGEN 0.2 06/03/2014 0740   NITRITE POSITIVE (A) 11/12/2016 1845   LEUKOCYTESUR LARGE (A) 11/12/2016 1845    Sepsis Labs: Lactic Acid, Venous    Component Value Date/Time   LATICACIDVEN 1.54 11/12/2016 1655    MICROBIOLOGY: Recent Results (from the past 240 hour(s))  Surgical pcr screen     Status: None   Collection Time: 09/19/17  8:00 PM  Result Value Ref Range Status   MRSA, PCR NEGATIVE NEGATIVE Final   Staphylococcus aureus NEGATIVE NEGATIVE Final    Comment: (NOTE) The Xpert SA  Assay (FDA approved for NASAL specimens in patients 82 years of age and older), is one component of a comprehensive surveillance program. It is not intended to diagnose infection nor to guide or monitor treatment. Performed at [facility], [address] [place], [place] [address]     RADIOLOGY STUDIES/RESULTS: Dg Chest 1 View  Result Date: 09/19/2017 CLINICAL DATA:  Weakness. The patient suffered a right hip fracture due to a fall today. EXAM: CHEST  1 VIEW COMPARISON:  PA and lateral chest 11/12/2016. FINDINGS: The lungs are clear. Heart size is upper normal. Aortic atherosclerosis is identified. The patient is status post aortic valve repair. No pneumothorax or pleural effusion. No acute bony abnormality. IMPRESSION: No acute disease. Atherosclerosis. Electronically Signed   By: Drusilla Kanner M.D.   On: 09/19/2017 11:45   Dg Wrist Complete Right  Result Date: 09/19/2017 CLINICAL DATA:  Fall.  Wrist injury. EXAM: RIGHT WRIST - COMPLETE 3+ VIEW COMPARISON:  No recent prior. FINDINGS: Severely displaced comminuted distal right radial metaphyseal fracture is present. Displaced distal ulnar fracture is present. Prominent overlying of fracture fragments noted. Diffuse osteopenia and degenerative change. IMPRESSION: Severely displaced and comminuted distal right radial metaphyseal fracture. Displaced distal ulnar fracture is also present. Fracture fragments are overriding. Electronically Signed   By: Maisie Fus  Register   On: 09/19/2017 11:48   Pelvis Portable  Result Date: 09/20/2017 CLINICAL DATA:  Status post hip surgery EXAM: PORTABLE PELVIS 1-2 VIEWS COMPARISON:  09/19/2017 FINDINGS: Lobulated calcifications in the left pelvis likely fibroids. Interval right hip hemiarthroplasty with normal alignment. Gas in the soft tissues consistent with recent operative status. IMPRESSION: Status post right hip hemiarthroplasty with expected postsurgical changes. Probable calcified fibroids in the  pelvis Electronically Signed   By: Jasmine Pang M.D.   On: 09/20/2017 20:09   Dg Ankle Right Port  Result Date: 09/23/2017 CLINICAL DATA:  Encounter for right ankle pain, pt has a hx of fall with multiple fxs. Only AP and lateral images obtained per ordering MD. EXAM: PORTABLE RIGHT ANKLE - 2 VIEW COMPARISON:  None. FINDINGS: No fracture.  No bone lesion. Ankle joint is normally spaced and aligned. No arthropathic changes. Bones are demineralized. There is diffuse soft tissue swelling as well as arterial vascular calcifications. IMPRESSION: No  fracture, bone lesion or ankle joint abnormality. Electronically Signed   By: Amie Portland M.D.   On: 09/23/2017 08:52   Dg Hand Complete Right  Result Date: 09/19/2017 CLINICAL DATA:  Fall.  Wrist injury. EXAM: RIGHT HAND - COMPLETE 3+ VIEW COMPARISON:  No prior. FINDINGS: Comminuted displaced overriding fractures are noted of the distal radius and ulna. Diffuse osteopenia degenerative change. No radiopaque foreign body. Peripheral vascular calcification. IMPRESSION: 1. Displaced comminuted angulated fractures of the distal radius and ulna. 2.  Peripheral vascular disease. Electronically Signed   By: Maisie Fus  Register   On: 09/19/2017 11:49   Dg C-arm 1-60 Min  Result Date: 09/20/2017 CLINICAL DATA:  Status post total hip replacement on the right for fracture EXAM: DG C-ARM 61-120 MIN; OPERATIVE RIGHT HIP WITH PELVIS COMPARISON:  Preoperative pelvis and right hip images Sep 19, 2017 FLUOROSCOPY TIME:  0 minutes 8 seconds; 5 acquired images FINDINGS: A series of frontal images were obtained showing total hip replacement on the right. Prosthetic components appear well seated on frontal view. Postoperatively, no fracture or dislocation is seen. IMPRESSION: Total hip replacement on the right with prosthetic components appearing well-seated on frontal view. No acute fracture or dislocation postoperatively. Electronically Signed   By: Bretta Bang III M.D.   On:  09/20/2017 16:24   Dg Hip Operative Unilat W Or W/o Pelvis Right  Result Date: 09/20/2017 CLINICAL DATA:  Status post total hip replacement on the right for fracture EXAM: DG C-ARM 61-120 MIN; OPERATIVE RIGHT HIP WITH PELVIS COMPARISON:  Preoperative pelvis and right hip images Sep 19, 2017 FLUOROSCOPY TIME:  0 minutes 8 seconds; 5 acquired images FINDINGS: A series of frontal images were obtained showing total hip replacement on the right. Prosthetic components appear well seated on frontal view. Postoperatively, no fracture or dislocation is seen. IMPRESSION: Total hip replacement on the right with prosthetic components appearing well-seated on frontal view. No acute fracture or dislocation postoperatively. Electronically Signed   By: Bretta Bang III M.D.   On: 09/20/2017 16:24   Dg Hip Unilat With Pelvis 2-3 Views Right  Result Date: 09/19/2017 CLINICAL DATA:  Right hip pain after fall. EXAM: DG HIP (WITH OR WITHOUT PELVIS) 2-3V RIGHT COMPARISON:  CT abdomen pelvis dated November 13, 2016. FINDINGS: Acute, displaced fracture through the right femoral neck. No dislocation. The left hip is unremarkable. The pubic symphysis and sacroiliac joints are intact. Osteopenia. Unchanged calcifications in the left pelvis associated with known large dermoid. IMPRESSION: 1. Acute, displaced right femoral neck fracture. Electronically Signed   By: Obie Dredge M.D.   On: 09/19/2017 11:47     LOS: 4 days    Signature  Susa Raring M.D on 09/23/2017 at 12:23 PM  Between 7am to 7pm - Pager - [phone] ( page via [email or web address], text pages only, please mention full 10 digit call back number).  After 7pm go to [email or web address] - password [email or web address]

## 2017-09-23 NOTE — Progress Notes (Addendum)
Orthopedics Progress Note  Subjective: Patient complains of right wrist pain and right hip and right ankle pain  Objective:  Vitals:   09/22/17 2216 09/23/17 0650  BP: (!) 117/52 (!) 160/85  Pulse: 82 89  Resp:    Temp: 98.9 F (37.2 C) 98.3 F (36.8 C)  SpO2: 91% 92%    General: Awake and alert  Musculoskeletal: Right hand swollen in the fingers. Above elbow sugar tong splint in place.  ACE wraps unwrapped and re-wrapped more loosely.  I suspect that the wrap around the elbow was too tight. Right hip incision CDI, Aquacel in place. Minimal swelling. Right ankle moderately swollen, especially laterally. Tender to palpation over the lateral malleolus.  Diminished ROM on the right due to pain. No swelling and no tenderness on the left. Normal AROM on the left. Neurovascularly intact  Lab Results  Component Value Date   WBC 9.3 09/23/2017   HGB 9.7 (L) 09/23/2017   HCT 31.5 (L) 09/23/2017   MCV 87.7 09/23/2017   PLT 150 09/23/2017       Component Value Date/Time   NA 138 09/23/2017 0323   K 3.8 09/23/2017 0323   CL 107 09/23/2017 0323   CO2 22 09/23/2017 0323   GLUCOSE 91 09/23/2017 0323   BUN 16 09/23/2017 0323   CREATININE 0.75 09/23/2017 0323   CALCIUM 8.0 (L) 09/23/2017 0323   GFRNONAA >60 09/23/2017 0323   GFRAA >60 09/23/2017 0323    Lab Results  Component Value Date   INR 1.08 09/19/2017   INR 1.2 10/19/2014   INR 2 10/07/2014    Assessment/Plan:  s/p Procedure(s): RIGHT ARTHROPLASTY BIPOLAR HIP (HEMIARTHROPLASTY) OPEN REDUCTION INTERNAL FIXATION (ORIF) DISTAL RADIAL FRACTURE, possible Ulna Fracture ORIF IRRIGATION AND DEBRIDEMENT Open Wrist Fracture Right ankle pain.  Will check XRAYs to rule out an ankle fracture on the right.  Almedia Balls. Ranell Patrick, MD 09/23/2017 8:29 AM   Addendum:  Ankle XRAYS negative for fracture - suspect mild sprain. WBAT on that ankle

## 2017-09-23 NOTE — Plan of Care (Signed)
  Problem: Clinical Measurements: Goal: Respiratory complications will improve Outcome: Progressing   Problem: Activity: Goal: Risk for activity intolerance will decrease Outcome: Not Progressing   Problem: Nutrition: Goal: Adequate nutrition will be maintained Outcome: Progressing   Problem: Elimination: Goal: Will not experience complications related to urinary retention Outcome: Completed/Met   Problem: Pain Managment: Goal: General experience of comfort will improve Outcome: Progressing

## 2017-09-24 ENCOUNTER — Encounter (HOSPITAL_COMMUNITY): Payer: Self-pay | Admitting: Orthopedic Surgery

## 2017-09-24 NOTE — Social Work (Addendum)
CSW checked ncmust and PASSR pending and has not been assigned either.  CSW then called [facility] and left msge for admission staff about placement. CSW f/u with Tresa Endo in admission at [facility].  10:50am: CSW spoke with son, Annette Stable, and discussed SNF offers. He will review and call CSW back to discuss.  Keene Breath, LCSW Clinical Social Worker [phone]

## 2017-09-24 NOTE — Progress Notes (Signed)
Occupational Therapy Treatment Patient Details Name: Rebecca [name] MRN: [id number] DOB: 1926/01/25 Today's Date: 09/24/2017    History of present illness Rebecca [name] is a pleasantly demented 84 y.o. female with PHMx significant PAF, HTN, dementia, aortic stenosis s/p TAVR presents to the ER from facility via EMS chief complaint right hip pain right wrist pain after a fall. Pt found to have right hip femoral neck fx and right wrist fx. Pt now s/p right hip hemiarthroplasty, anterior approach and ORIF rigth distal radius and reduction right distal ulna without fixation   OT comments  Pt initially evaluated by OT 09/21/17 with further treatment deferred to OT at [facility]. However, OT notified by PT staff that pt with difficulty self-feeding with L UE due to poor coordination, ADL participation, and with concerns over R hand edema management. Pt requiring mod assist for self-feeding on my arrival and demonstrating poor coordination of L hand to control utensils. Provided built-up handles with red tubing with immediate improvement and pt able to utilize spoon with supervision. Noted significant swelling in R digits and facilitated AROM/AAROM. Pt limited in carry-over due to baseline dementia but feel she would benefit from continued OT to ensure engagement with self-feeding and grooming tasks while admitted. Continue to recommend SNF.    Follow Up Recommendations  Supervision/Assistance - 24 hour    Equipment Recommendations  None recommended by OT    Recommendations for Other Services      Precautions / Restrictions Precautions Precautions: Fall Required Braces or Orthoses: Sling Restrictions Weight Bearing Restrictions: Yes RUE Weight Bearing: Weight bear through elbow only(per order, NWB through wrist; WB through elbow only) RLE Weight Bearing: Weight bearing as tolerated       Mobility Bed Mobility Overal bed mobility: Needs Assistance Bed Mobility: Supine to Sit     Supine to sit:  Total assist;+2 for physical assistance     General bed mobility comments: OOB in recliner on my arrival.   Transfers Overall transfer level: Needs assistance Equipment used: (sara stedy) Transfers: Sit to/from Stand Sit to Stand: Mod assist;+2 physical assistance(from elevated surface of bed and stedy plates.  ) Stand pivot transfers: (total assist via sara stedy.  )       General transfer comment: Cues for hand placement to and from seated surface to pull on stedy cross bar.  Pt required facilitation for hip and trunk extension to place stedy plates and remove plates.  Pt able to stand more erect during second transfer.      Balance     Sitting balance-Leahy Scale: Poor Sitting balance - Comments: L lateral lean     Standing balance-Leahy Scale: Poor Standing balance comment: L lateral lean.                             ADL either performed or assessed with clinical judgement   ADL Overall ADL's : Needs assistance/impaired Eating/Feeding: Moderate assistance;Sitting;Supervision/ safety;With adaptive utensils Eating/Feeding Details (indicate cue type and reason): Initially requiring mod assist to self-feed using L hand due to poor coordination. Provided tubing for built-up handle and pt improving to supervision level with spoon and min assist with fork.  Grooming: Minimal assistance;Sitting Grooming Details (indicate cue type and reason): Able to use L UE to complete hair brushing task with assistance for thoroughness.  General ADL Comments: Session focused on improved functional independence to facilitate participation in familiar tasks wtih AE to maximize safety.      Vision       Perception     Praxis      Cognition Arousal/Alertness: Awake/alert Behavior During Therapy: WFL for tasks assessed/performed Overall Cognitive Status: History of cognitive impairments - at baseline                                  General Comments: Pleasantly confused throughout session        Exercises     Shoulder Instructions       General Comments      Pertinent Vitals/ Pain       Pain Assessment: Faces Faces Pain Scale: Hurts little more Pain Location: R hand Pain Descriptors / Indicators: Grimacing;Sore Pain Intervention(s): Limited activity within patient's tolerance;Monitored during session;Repositioned;Utilized relaxation techniques  Home Living                                          Prior Functioning/Environment              Frequency           Progress Toward Goals  OT Goals(current goals can now be found in the care plan section)  Progress towards OT goals: (Goals updated, initially deferred)  Acute Rehab OT Goals Patient Stated Goal: agreed to get up in recliner once seated at EOB ADL Goals Pt Will Perform Eating: with supervision;sitting;with adaptive utensils(with L hand) Pt Will Perform Grooming: with supervision;sitting;with adaptive equipment Pt/caregiver will Perform Home Exercise Program: Right Upper extremity;With minimal assist;With written HEP provided(assist from caregiver; R digit AROM/AAROM)  Plan Discharge plan remains appropriate    Co-evaluation                 AM-PAC PT "6 Clicks" Daily Activity     Outcome Measure   Help from another person eating meals?: A Little Help from another person taking care of personal grooming?: A Lot Help from another person toileting, which includes using toliet, bedpan, or urinal?: Total Help from another person bathing (including washing, rinsing, drying)?: A Lot Help from another person to put on and taking off regular upper body clothing?: Total Help from another person to put on and taking off regular lower body clothing?: Total 6 Click Score: 10    End of Session Equipment Utilized During Treatment: Other (comment)  OT Visit Diagnosis: Unsteadiness on feet (R26.81);Other  abnormalities of gait and mobility (R26.89);Muscle weakness (generalized) (M62.81);Other symptoms and signs involving cognitive function;Pain Pain - Right/Left: Right Pain - part of body: Leg   Activity Tolerance Patient tolerated treatment well   Patient Left in chair;with call bell/phone within reach;with chair alarm set   Nurse Communication Mobility status;Other (comment)(nurse tech - use of tubing for built up handles)        Time: 1610-9604 OT Time Calculation (min): 12 min  Charges: OT General Charges $OT Visit: 1 Visit OT Treatments $Self Care/Home Management : 8-22 mins  Doristine Section, MS OTR/L  Pager: [phone]    Kervin Bones A Wane Mollett 09/24/2017, 2:57 PM

## 2017-09-24 NOTE — Progress Notes (Signed)
Physical Therapy Treatment Patient Details Name: Rebecca [name] MRN: [id number] DOB: Sep 20, 1925 Today's Date: 09/24/2017    History of Present Illness Rebecca [name] is a pleasantly demented 84 y.o. female with PHMx significant PAF, HTN, dementia, aortic stenosis s/p TAVR presents to the ER from facility via EMS chief complaint right hip pain right wrist pain after a fall. Pt found to have right hip femoral neck fx and right wrist fx. Pt now s/p right hip hemiarthroplasty, anterior approach and ORIF rigth distal radius and reduction right distal ulna without fixation    PT Comments    Pt performed transfer training including rolling to R and L for peri anal care.  Pt tolerated session well and performed transfer with decreased assistance and use of lift equipment.  Pt remains painful and will continue to require SNF placement at d/c.  Lunch tray arrived and patient unable to feed as she is R handed.  Obtained OT order and OT entered to assist in feeding.     Follow Up Recommendations  SNF;Supervision/Assistance - 24 hour     Equipment Recommendations  None recommended by PT    Recommendations for Other Services       Precautions / Restrictions Precautions Precautions: Fall Required Braces or Orthoses: Sling Restrictions Weight Bearing Restrictions: Yes RUE Weight Bearing: Weight bearing as tolerated(per order through elbow only, NWB through wrist.  ) RLE Weight Bearing: Weight bearing as tolerated    Mobility  Bed Mobility Overal bed mobility: Needs Assistance Bed Mobility: Supine to Sit     Supine to sit: Total assist;+2 for physical assistance     General bed mobility comments: use of bed pads  Transfers Overall transfer level: Needs assistance Equipment used: (sara stedy) Transfers: Sit to/from Stand Sit to Stand: Mod assist;+2 physical assistance(from elevated surface of bed and stedy plates.  ) Stand pivot transfers: (total assist via sara stedy.  )       General  transfer comment: Cues for hand placement to and from seated surface to pull on stedy cross bar.  Pt required facilitation for hip and trunk extension to place stedy plates and remove plates.  Pt able to stand more erect during second transfer.    Ambulation/Gait Ambulation/Gait assistance: (NT)               Stairs             Wheelchair Mobility    Modified Rankin (Stroke Patients Only)       Balance     Sitting balance-Leahy Scale: Poor Sitting balance - Comments: L lateral lean     Standing balance-Leahy Scale: Poor Standing balance comment: L lateral lean.                              Cognition Arousal/Alertness: Awake/alert Behavior During Therapy: WFL for tasks assessed/performed Overall Cognitive Status: History of cognitive impairments - at baseline                                        Exercises      General Comments        Pertinent Vitals/Pain Pain Assessment: Faces Faces Pain Scale: Hurts whole lot Pain Location: right LE Pain Descriptors / Indicators: Grimacing;Sore Pain Intervention(s): Monitored during session;Repositioned    Home Living  Prior Function            PT Goals (current goals can now be found in the care plan section) Acute Rehab PT Goals Patient Stated Goal: agreed to get up in recliner once seated at EOB Potential to Achieve Goals: Fair Progress towards PT goals: Progressing toward goals    Frequency    Min 3X/week      PT Plan Current plan remains appropriate    Co-evaluation              AM-PAC PT "6 Clicks" Daily Activity  Outcome Measure  Difficulty turning over in bed (including adjusting bedclothes, sheets and blankets)?: Unable Difficulty moving from lying on back to sitting on the side of the bed? : Unable Difficulty sitting down on and standing up from a chair with arms (e.g., wheelchair, bedside commode, etc,.)?: Unable Help  needed moving to and from a bed to chair (including a wheelchair)?: A Lot Help needed walking in hospital room?: Total Help needed climbing 3-5 steps with a railing? : Total 6 Click Score: 7    End of Session Equipment Utilized During Treatment: Gait belt(RUE sling) Activity Tolerance: Patient limited by pain;Patient limited by fatigue Patient left: in chair;with call bell/phone within reach Nurse Communication: Mobility status PT Visit Diagnosis: Other abnormalities of gait and mobility (R26.89);Pain Pain - Right/Left: Right Pain - part of body: Hip     Time: 1203-1226 PT Time Calculation (min) (ACUTE ONLY): 23 min  Charges:  $Therapeutic Activity: 23-37 mins                    G Codes:       Joycelyn Rua, PTA pager [phone]    Florestine Avers 09/24/2017, 12:46 PM

## 2017-09-24 NOTE — Social Work (Addendum)
CSW discussed bed offer with son-n-law and they have accepted SNF bed from [facility].  CSW advised family at bedside that Rebecca [name] declined to offer a SNF bed.  CSW will f/u for disposition.  Keene Breath, LCSW Clinical Social Worker [phone]

## 2017-09-24 NOTE — Progress Notes (Addendum)
PROGRESS NOTE        PATIENT DETAILS Name: Rebecca [name] Age: 84 y.o. Sex: female Date of Birth: 1926-03-07 Admit Date: 09/19/2017 Admitting Physician Jonah Blue, MD ZOX:WRUE, Darlen Round, MD  Brief Narrative:  Patient is a 84 y.o. female with history of PAF on Eliquis, severe aortic stenosis status post TAVR-presented to the ED following a mechanical fall, she was found to have right hip fracture and a right open wrist fracture.  She was seen by hand surgery and orthopedics, and underwent surgical repair on 5/24.  Now awaits SNF placement in bed.  Subjective: Patient in bed, appears comfortable, denies any headache, no fever, no chest pain or pressure, no shortness of breath , no abdominal pain. No focal weakness.   Assessment/Plan:  Closed right hip fracture and right distal radius and ulna fractures secondary to mechanical fall:  In by orthopedics and underwent surgical repair on 09/20/2017, weightbearing as tolerated, home dose Eliquis continued for DVT prophylaxis.  She has been seen by physical therapy and qualifies for SNF bed, we await bed availability, social work working on it.  She did have mild perioperative blood loss related anemia not requiring transfusion will monitor.  Case discussed with orthopedic surgeon Dr. Ranell Patrick bedside on 09/23/2017.  Anemia: See above  Fever: One episode of fever morning of 09/21/2017 resolved.  No signs of infection currently.  Mild AKI: Likely hemodynamically mediated-resolved after gentle hydration with IV fluids.  PAF:  Rate controlled without use of any rate control medications, Eliquis is been resumed.  Follow.  Severe AS-status post TAVR - currently stable  .  Chronic diastolic heart failure:  Compensated-no role for diuretics at this time.  Hypertension: Blood pressure currently controlled without the use of any antihypertensives.  Follow for now.   Depression:  Resume Zoloft and  Remeron.  Dementia.  Presently confused-suspect she is at her usual baseline-resume Namenda.    DVT Prophylaxis: Full dose anticoagulation with Eliquis  Code Status: DNR  Family Communication: None at bedside  Disposition Plan:  Remain inpatient- SNF bed awaited for 3 days, requested to sign PASSR on 09/24/17 at 12 pm, signed at 1pm.   Antimicrobial agents: Anti-infectives (From admission, onward)   Start     Dose/Rate Route Frequency Ordered Stop   09/21/17 2200  trimethoprim (TRIMPEX) tablet 100 mg     100 mg Oral Daily at bedtime 09/21/17 1551     09/20/17 1815  ceFAZolin (ANCEF) IVPB 2g/100 mL premix     2 g 200 mL/hr over 30 Minutes Intravenous Every 6 hours 09/20/17 1815 09/21/17 0614   09/20/17 0815  ceFAZolin (ANCEF) IVPB 2g/100 mL premix  Status:  Discontinued     2 g 200 mL/hr over 30 Minutes Intravenous On call to O.R. 09/20/17 0801 09/20/17 1745   09/20/17 0600  ceFAZolin (ANCEF) IVPB 2g/100 mL premix     2 g 200 mL/hr over 30 Minutes Intravenous On call to O.R. 09/19/17 1450 09/20/17 1335   09/19/17 1215  ceFAZolin (ANCEF) IVPB 1 g/50 mL premix     1 g 100 mL/hr over 30 Minutes Intravenous  Once 09/19/17 1207 09/19/17 1409      Procedures: 5/24>> 1. ORIF Right extraarticular distal radius fracture 2. Irrigation and debridement open right distal ulna fracture including skin, subcutaneous tissue, and tissue adjacent to bone 3. Reduction right distal ulna fracture  without fixation 4.RIGHT ARTHROPLASTY BIPOLAR HIP  CONSULTS:  ID  Time spent: 25- minutes-Greater than 50% of this time was spent in counseling, explanation of diagnosis, planning of further management, and coordination of care.  MEDICATIONS: Scheduled Meds: . acetaminophen  1,000 mg Oral Q8H  . apixaban  2.5 mg Oral BID  . memantine  10 mg Oral BID  . mirtazapine  30 mg Oral QHS  . polyethylene glycol  17 g Oral Daily  . sertraline  100 mg Oral Q breakfast  . trimethoprim  100 mg Oral  QHS   Continuous Infusions:  PRN Meds:.fentaNYL (SUBLIMAZE) injection, methocarbamol **OR** [DISCONTINUED] methocarbamol (ROBAXIN)  IV, ondansetron (ZOFRAN) IV, [DISCONTINUED] ondansetron **OR** ondansetron (ZOFRAN) IV, polyethylene glycol, traMADol   PHYSICAL EXAM: Vital signs: Vitals:   09/23/17 0650 09/23/17 1623 09/23/17 2123 09/24/17 0556  BP: (!) 160/85 (!) 127/54 107/61 (!) 143/68  Pulse: 89 83 84 81  Resp:  18    Temp: 98.3 F (36.8 C) (!) 97.4 F (36.3 C) 97.9 F (36.6 C) 98.1 F (36.7 C)  TempSrc: Oral Oral Oral Oral  SpO2: 92% 100% 93% 94%  Weight:      Height:       Filed Weights   09/19/17 1554  Weight: 62.1 kg (136 lb 14.5 oz)   Body mass index is 24.25 kg/m.   Exam  Awake Alert, Oriented X 2, No new F.N deficits, Normal affect [place].AT,PERRAL Supple Neck,No JVD, No cervical lymphadenopathy appriciated.  Symmetrical Chest wall movement, Good air movement bilaterally, CTAB RRR,No Gallops, Rubs, No Parasternal Heave +ve B.Sounds, Abd Soft, No tenderness, No organomegaly appriciated, No rebound - guarding or rigidity. No Cyanosis, Clubbing or edema, No new Rash or bruise Positive aortic systolic murmur, right arm in sling with much improved swelling in the hands and digits, right hip postop site appears stable  I have personally reviewed following labs and imaging studies  LABORATORY DATA: CBC: Recent Labs  Lab 09/19/17 1058 09/20/17 0429 09/21/17 0640 09/22/17 0509 09/23/17 0323  WBC 11.5* 11.0* 13.7* 9.9 9.3  NEUTROABS 7.5  --   --   --   --   HGB 13.6 12.5 9.5* 8.6* 9.7*  HCT 42.3 39.3 30.0* 27.8* 31.5*  MCV 83.9 84.5 85.0 84.8 87.7  PLT 217 166 142* 130* 150    Basic Metabolic Panel: Recent Labs  Lab 09/19/17 1205 09/20/17 0429 09/21/17 0640 09/22/17 0509 09/23/17 0323  NA 142 142 137 136 138  K 3.7 3.5 3.6 3.4* 3.8  CL 105 107 104 104 107  CO2 GLUCOSE 145* 122* 136* 109* 91  BUN CREATININE 1.06*  0.78 1.22* 0.93 0.75  CALCIUM 8.7* 8.4* 7.8* 7.4* 8.0*    GFR: Estimated Creatinine Clearance: 39.4 mL/min (by C-G formula based on SCr of 0.75 mg/dL).  Liver Function Tests: Recent Labs  Lab 09/19/17 1205  AST 29  ALT 16  ALKPHOS 102  BILITOT 1.0  PROT 6.6  ALBUMIN 3.4*   No results for input(s): LIPASE, AMYLASE in the last 168 hours. No results for input(s): AMMONIA in the last 168 hours.  Coagulation Profile: Recent Labs  Lab 09/19/17 1205  INR 1.08    Cardiac Enzymes: No results for input(s): CKTOTAL, CKMB, CKMBINDEX, TROPONINI in the last 168 hours.  BNP (last 3 results) No results for input(s): PROBNP in the last 8760 hours.  HbA1C: No results for input(s): HGBA1C in the last 72 hours.  CBG:  No results for input(s): GLUCAP in the last 168 hours.  Lipid Profile: No results for input(s): CHOL, HDL, LDLCALC, TRIG, CHOLHDL, LDLDIRECT in the last 72 hours.  Thyroid Function Tests: No results for input(s): TSH, T4TOTAL, FREET4, T3FREE, THYROIDAB in the last 72 hours.  Anemia Panel: No results for input(s): VITAMINB12, FOLATE, FERRITIN, TIBC, IRON, RETICCTPCT in the last 72 hours.  Urine analysis:    Component Value Date/Time   COLORURINE YELLOW 11/12/2016 1845   APPEARANCEUR HAZY (A) 11/12/2016 1845   LABSPEC 1.006 11/12/2016 1845   PHURINE 6.0 11/12/2016 1845   GLUCOSEU NEGATIVE 11/12/2016 1845   HGBUR MODERATE (A) 11/12/2016 1845   BILIRUBINUR NEGATIVE 11/12/2016 1845   KETONESUR NEGATIVE 11/12/2016 1845   PROTEINUR 100 (A) 11/12/2016 1845   UROBILINOGEN 0.2 06/03/2014 0740   NITRITE POSITIVE (A) 11/12/2016 1845   LEUKOCYTESUR LARGE (A) 11/12/2016 1845    Sepsis Labs: Lactic Acid, Venous    Component Value Date/Time   LATICACIDVEN 1.54 11/12/2016 1655    MICROBIOLOGY: Recent Results (from the past 240 hour(s))  Surgical pcr screen     Status: None   Collection Time: 09/19/17  8:00 PM  Result Value Ref Range Status   MRSA, PCR NEGATIVE  NEGATIVE Final   Staphylococcus aureus NEGATIVE NEGATIVE Final    Comment: (NOTE) The Xpert SA Assay (FDA approved for NASAL specimens in patients 73 years of age and older), is one component of a comprehensive surveillance program. It is not intended to diagnose infection nor to guide or monitor treatment. Performed at [facility], [address], [place], [place] [address]     RADIOLOGY STUDIES/RESULTS: Dg Chest 1 View  Result Date: 09/19/2017 CLINICAL DATA:  Weakness. The patient suffered a right hip fracture due to a fall today. EXAM: CHEST  1 VIEW COMPARISON:  PA and lateral chest 11/12/2016. FINDINGS: The lungs are clear. Heart size is upper normal. Aortic atherosclerosis is identified. The patient is status post aortic valve repair. No pneumothorax or pleural effusion. No acute bony abnormality. IMPRESSION: No acute disease. Atherosclerosis. Electronically Signed   By: Drusilla Kanner M.D.   On: 09/19/2017 11:45   Dg Wrist Complete Right  Result Date: 09/19/2017 CLINICAL DATA:  Fall.  Wrist injury. EXAM: RIGHT WRIST - COMPLETE 3+ VIEW COMPARISON:  No recent prior. FINDINGS: Severely displaced comminuted distal right radial metaphyseal fracture is present. Displaced distal ulnar fracture is present. Prominent overlying of fracture fragments noted. Diffuse osteopenia and degenerative change. IMPRESSION: Severely displaced and comminuted distal right radial metaphyseal fracture. Displaced distal ulnar fracture is also present. Fracture fragments are overriding. Electronically Signed   By: Maisie Fus  Register   On: 09/19/2017 11:48   Pelvis Portable  Result Date: 09/20/2017 CLINICAL DATA:  Status post hip surgery EXAM: PORTABLE PELVIS 1-2 VIEWS COMPARISON:  09/19/2017 FINDINGS: Lobulated calcifications in the left pelvis likely fibroids. Interval right hip hemiarthroplasty with normal alignment. Gas in the soft tissues consistent with recent operative status. IMPRESSION: Status post  right hip hemiarthroplasty with expected postsurgical changes. Probable calcified fibroids in the pelvis Electronically Signed   By: Jasmine Pang M.D.   On: 09/20/2017 20:09   Dg Ankle Right Port  Result Date: 09/23/2017 CLINICAL DATA:  Encounter for right ankle pain, pt has a hx of fall with multiple fxs. Only AP and lateral images obtained per ordering MD. EXAM: PORTABLE RIGHT ANKLE - 2 VIEW COMPARISON:  None. FINDINGS: No fracture.  No bone lesion. Ankle joint is normally spaced and aligned. No  arthropathic changes. Bones are demineralized. There is diffuse soft tissue swelling as well as arterial vascular calcifications. IMPRESSION: No fracture, bone lesion or ankle joint abnormality. Electronically Signed   By: Amie Portland M.D.   On: 09/23/2017 08:52   Dg Hand Complete Right  Result Date: 09/19/2017 CLINICAL DATA:  Fall.  Wrist injury. EXAM: RIGHT HAND - COMPLETE 3+ VIEW COMPARISON:  No prior. FINDINGS: Comminuted displaced overriding fractures are noted of the distal radius and ulna. Diffuse osteopenia degenerative change. No radiopaque foreign body. Peripheral vascular calcification. IMPRESSION: 1. Displaced comminuted angulated fractures of the distal radius and ulna. 2.  Peripheral vascular disease. Electronically Signed   By: Maisie Fus  Register   On: 09/19/2017 11:49   Dg C-arm 1-60 Min  Result Date: 09/20/2017 CLINICAL DATA:  Status post total hip replacement on the right for fracture EXAM: DG C-ARM 61-120 MIN; OPERATIVE RIGHT HIP WITH PELVIS COMPARISON:  Preoperative pelvis and right hip images Sep 19, 2017 FLUOROSCOPY TIME:  0 minutes 8 seconds; 5 acquired images FINDINGS: A series of frontal images were obtained showing total hip replacement on the right. Prosthetic components appear well seated on frontal view. Postoperatively, no fracture or dislocation is seen. IMPRESSION: Total hip replacement on the right with prosthetic components appearing well-seated on frontal view. No acute  fracture or dislocation postoperatively. Electronically Signed   By: Bretta Bang III M.D.   On: 09/20/2017 16:24   Dg Hip Operative Unilat W Or W/o Pelvis Right  Result Date: 09/20/2017 CLINICAL DATA:  Status post total hip replacement on the right for fracture EXAM: DG C-ARM 61-120 MIN; OPERATIVE RIGHT HIP WITH PELVIS COMPARISON:  Preoperative pelvis and right hip images Sep 19, 2017 FLUOROSCOPY TIME:  0 minutes 8 seconds; 5 acquired images FINDINGS: A series of frontal images were obtained showing total hip replacement on the right. Prosthetic components appear well seated on frontal view. Postoperatively, no fracture or dislocation is seen. IMPRESSION: Total hip replacement on the right with prosthetic components appearing well-seated on frontal view. No acute fracture or dislocation postoperatively. Electronically Signed   By: Bretta Bang III M.D.   On: 09/20/2017 16:24   Dg Hip Unilat With Pelvis 2-3 Views Right  Result Date: 09/19/2017 CLINICAL DATA:  Right hip pain after fall. EXAM: DG HIP (WITH OR WITHOUT PELVIS) 2-3V RIGHT COMPARISON:  CT abdomen pelvis dated November 13, 2016. FINDINGS: Acute, displaced fracture through the right femoral neck. No dislocation. The left hip is unremarkable. The pubic symphysis and sacroiliac joints are intact. Osteopenia. Unchanged calcifications in the left pelvis associated with known large dermoid. IMPRESSION: 1. Acute, displaced right femoral neck fracture. Electronically Signed   By: Obie Dredge M.D.   On: 09/19/2017 11:47     LOS: 5 days    Signature  Susa Raring M.D on 09/24/2017 at 11:33 AM  Between 7am to 7pm - Pager - [phone] ( page via [email or web address], text pages only, please mention full 10 digit call back number).  After 7pm go to [email or web address] - password [email or web address]

## 2017-09-24 NOTE — Social Work (Addendum)
CSW f/u on disposition.  30 day note on chart to be signed to obtain PASSR for SNF placement.  CSW will f/u.  2:45pm: faxed clinicals to [facility] for SNF placement, awaiting on PASSR.  Keene Breath, LCSW Clinical Social Worker [phone]

## 2017-09-24 NOTE — Progress Notes (Signed)
    Subjective:  Patient reports pain as mild to moderate.  Denies N/V/CP/SOB. C/o R wrist pain.  Objective:   VITALS:   Vitals:   09/23/17 0650 09/23/17 1623 09/23/17 2123 09/24/17 0556  BP: (!) 160/85 (!) 127/54 107/61 (!) 143/68  Pulse: 89 83 84 81  Resp:  18    Temp: 98.3 F (36.8 C) (!) 97.4 F (36.3 C) 97.9 F (36.6 C) 98.1 F (36.7 C)  TempSrc: Oral Oral Oral Oral  SpO2: 92% 100% 93% 94%  Weight:      Height:        NAD ABD soft Sensation intact distally Intact pulses distally Dorsiflexion/Plantar flexion intact Incision: dressing C/D/I Compartment soft   Lab Results  Component Value Date   WBC 9.3 09/23/2017   HGB 9.7 (L) 09/23/2017   HCT 31.5 (L) 09/23/2017   MCV 87.7 09/23/2017   PLT 150 09/23/2017   BMET    Component Value Date/Time   NA 138 09/23/2017 0323   K 3.8 09/23/2017 0323   CL 107 09/23/2017 0323   CO2 22 09/23/2017 0323   GLUCOSE 91 09/23/2017 0323   BUN 16 09/23/2017 0323   CREATININE 0.75 09/23/2017 0323   CALCIUM 8.0 (L) 09/23/2017 0323   GFRNONAA >60 09/23/2017 0323   GFRAA >60 09/23/2017 0323     Assessment/Plan: 4 Days Post-Op   Principal Problem:   Closed right hip fracture, initial encounter (HCC) Active Problems:   Essential hypertension   Aortic stenosis, severe   PAF (paroxysmal atrial fibrillation) (HCC)   Dementia   Right wrist fracture, open, initial encounter   Displaced fracture of right femoral neck (HCC)   WBAT with walker DVT ppx: eliquis, SCDs, TEDS PO pain control PT/OT Dispo: D/C to SNF   Iline Oven Alyss Granato 09/24/2017, 12:29 PM   Samson Frederic, MD Cell [phone]

## 2017-09-24 NOTE — Social Work (Addendum)
CSW f/u for disposition.  PASSR #[id number] E received for SNF placement.  CSW received call back from family indicating that they are not sure they will accept the bed from [facility] and intend on going to visit another [facility] today before making final decision.  CSW will f/u.  Keene Breath, LCSW Clinical Social Worker [phone]

## 2017-09-24 NOTE — Progress Notes (Signed)
Physical Therapy Treatment Patient Details Name: Rebecca [name] MRN: [id number] DOB: 03-03-1926 Today's Date: 09/24/2017    History of Present Illness Rebecca [name] is a pleasantly demented 84 y.o. female with PHMx significant PAF, HTN, dementia, aortic stenosis s/p TAVR presents to the ER from facility via EMS chief complaint right hip pain right wrist pain after a fall. Pt found to have right hip femoral neck fx and right wrist fx. Pt now s/p right hip hemiarthroplasty, anterior approach and ORIF rigth distal radius and reduction right distal ulna without fixation    PT Comments    PTA returned for second session to assist in back to bed transfer.  Pt required max assistance +2 for sit to stand transfer into sara stedy frame.  Pt remains to require assistance to mobilize back into bed with total assistance.  Plan next session for therapeutic exercise to RLE.  Pt remains appropriate for SNF placement.  Post session patient in supine requesting pain meds and RN informed of request.  Ice packs placed to RUE and R hip.      Follow Up Recommendations  SNF;Supervision/Assistance - 24 hour     Equipment Recommendations  None recommended by PT    Recommendations for Other Services       Precautions / Restrictions Precautions Precautions: Fall Required Braces or Orthoses: Sling Restrictions Weight Bearing Restrictions: Yes RUE Weight Bearing: Weight bear through elbow only(NWB through wrist.  ) RLE Weight Bearing: Weight bearing as tolerated    Mobility  Bed Mobility Overal bed mobility: Needs Assistance Bed Mobility: Sit to Supine;Rolling Rolling: Total assist;+2 for physical assistance     Sit to supine: Total assist;+2 for physical assistance   General bed mobility comments: Pt transferred back to bed and required total assistance to return to supine.  mutliple reps of rolling to position bed pad and performed scoot in supine with use of bed pad.    Transfers Overall transfer  level: Needs assistance Equipment used: (sara stedy.  ) Transfers: Sit to/from Stand Sit to Stand: Max assist;From elevated surface Stand pivot transfers: (total assistance to turn and pivot in sara stedy frame.  )       General transfer comment: Cues for hand placement to reach for sara stedy cross bar.  Increased assistance to extend hips to allow for sara stedy placement of plates.  Pt noticeably fatigued from earlier session.    Ambulation/Gait Ambulation/Gait assistance: (NT)               Stairs             Wheelchair Mobility    Modified Rankin (Stroke Patients Only)       Balance     Sitting balance-Leahy Scale: Poor       Standing balance-Leahy Scale: Poor                              Cognition Arousal/Alertness: Awake/alert Behavior During Therapy: WFL for tasks assessed/performed Overall Cognitive Status: History of cognitive impairments - at baseline                                 General Comments: Pleasantly confused throughout session      Exercises      General Comments        Pertinent Vitals/Pain Pain Assessment: 0-10 Faces Pain Scale: Hurts little more Pain Location: R hand/  R hip  Pain Descriptors / Indicators: Grimacing;Sore Pain Intervention(s): Monitored during session;Repositioned;Ice applied    Home Living                      Prior Function            PT Goals (current goals can now be found in the care plan section) Acute Rehab PT Goals Patient Stated Goal: agreed to get up in recliner once seated at EOB Potential to Achieve Goals: Fair Progress towards PT goals: Progressing toward goals    Frequency    Min 3X/week      PT Plan Current plan remains appropriate    Co-evaluation              AM-PAC PT "6 Clicks" Daily Activity  Outcome Measure  Difficulty turning over in bed (including adjusting bedclothes, sheets and blankets)?: Unable Difficulty moving  from lying on back to sitting on the side of the bed? : Unable Difficulty sitting down on and standing up from a chair with arms (e.g., wheelchair, bedside commode, etc,.)?: Unable Help needed moving to and from a bed to chair (including a wheelchair)?: A Lot Help needed walking in hospital room?: Total Help needed climbing 3-5 steps with a railing? : Total 6 Click Score: 7    End of Session Equipment Utilized During Treatment: Gait belt(RUE sling) Activity Tolerance: Patient limited by pain;Patient limited by fatigue Patient left: in chair;with call bell/phone within reach Nurse Communication: Mobility status PT Visit Diagnosis: Other abnormalities of gait and mobility (R26.89);Pain Pain - Right/Left: Right Pain - part of body: Hip     Time: 4098-1191 PT Time Calculation (min) (ACUTE ONLY): 20 min  Charges:  $Therapeutic Activity: 8-22 mins                    G Codes:       Joycelyn Rua, PTA pager [phone]    Florestine Avers 09/24/2017, 5:45 PM

## 2017-09-24 NOTE — Care Management Important Message (Signed)
Important Message  Patient Details  Name: Gennie Eisinger MRN: [id number] Date of Birth: [birth year]   Medicare Important Message Given:  Yes Due to illness unsigned copy was left.   Loria Lacina 09/24/2017, 2:04 PM

## 2017-09-25 DIAGNOSIS — S52201A Unspecified fracture of shaft of right ulna, initial encounter for closed fracture: Secondary | ICD-10-CM | POA: Diagnosis not present

## 2017-09-25 DIAGNOSIS — Z4789 Encounter for other orthopedic aftercare: Secondary | ICD-10-CM | POA: Diagnosis not present

## 2017-09-25 DIAGNOSIS — F322 Major depressive disorder, single episode, severe without psychotic features: Secondary | ICD-10-CM | POA: Diagnosis not present

## 2017-09-25 DIAGNOSIS — I10 Essential (primary) hypertension: Secondary | ICD-10-CM | POA: Diagnosis not present

## 2017-09-25 DIAGNOSIS — Z96641 Presence of right artificial hip joint: Secondary | ICD-10-CM | POA: Diagnosis not present

## 2017-09-25 DIAGNOSIS — F2089 Other schizophrenia: Secondary | ICD-10-CM | POA: Diagnosis not present

## 2017-09-25 DIAGNOSIS — K5900 Constipation, unspecified: Secondary | ICD-10-CM | POA: Diagnosis not present

## 2017-09-25 DIAGNOSIS — S52551D Other extraarticular fracture of lower end of right radius, subsequent encounter for closed fracture with routine healing: Secondary | ICD-10-CM | POA: Diagnosis not present

## 2017-09-25 DIAGNOSIS — I5032 Chronic diastolic (congestive) heart failure: Secondary | ICD-10-CM | POA: Diagnosis not present

## 2017-09-25 DIAGNOSIS — M6281 Muscle weakness (generalized): Secondary | ICD-10-CM | POA: Diagnosis not present

## 2017-09-25 DIAGNOSIS — S52691E Other fracture of lower end of right ulna, subsequent encounter for open fracture type I or II with routine healing: Secondary | ICD-10-CM | POA: Diagnosis not present

## 2017-09-25 DIAGNOSIS — R279 Unspecified lack of coordination: Secondary | ICD-10-CM | POA: Diagnosis not present

## 2017-09-25 DIAGNOSIS — S5291XA Unspecified fracture of right forearm, initial encounter for closed fracture: Secondary | ICD-10-CM | POA: Diagnosis not present

## 2017-09-25 DIAGNOSIS — R293 Abnormal posture: Secondary | ICD-10-CM | POA: Diagnosis not present

## 2017-09-25 DIAGNOSIS — R2689 Other abnormalities of gait and mobility: Secondary | ICD-10-CM | POA: Diagnosis not present

## 2017-09-25 DIAGNOSIS — M25531 Pain in right wrist: Secondary | ICD-10-CM | POA: Diagnosis not present

## 2017-09-25 DIAGNOSIS — R41841 Cognitive communication deficit: Secondary | ICD-10-CM | POA: Diagnosis not present

## 2017-09-25 DIAGNOSIS — Z743 Need for continuous supervision: Secondary | ICD-10-CM | POA: Diagnosis not present

## 2017-09-25 DIAGNOSIS — F0390 Unspecified dementia without behavioral disturbance: Secondary | ICD-10-CM | POA: Diagnosis not present

## 2017-09-25 DIAGNOSIS — I480 Paroxysmal atrial fibrillation: Secondary | ICD-10-CM | POA: Diagnosis not present

## 2017-09-25 DIAGNOSIS — I739 Peripheral vascular disease, unspecified: Secondary | ICD-10-CM | POA: Diagnosis not present

## 2017-09-25 DIAGNOSIS — I509 Heart failure, unspecified: Secondary | ICD-10-CM | POA: Diagnosis not present

## 2017-09-25 DIAGNOSIS — S72001S Fracture of unspecified part of neck of right femur, sequela: Secondary | ICD-10-CM | POA: Diagnosis not present

## 2017-09-25 DIAGNOSIS — I4891 Unspecified atrial fibrillation: Secondary | ICD-10-CM | POA: Diagnosis not present

## 2017-09-25 DIAGNOSIS — S72001A Fracture of unspecified part of neck of right femur, initial encounter for closed fracture: Secondary | ICD-10-CM | POA: Diagnosis not present

## 2017-09-25 DIAGNOSIS — Z471 Aftercare following joint replacement surgery: Secondary | ICD-10-CM | POA: Diagnosis not present

## 2017-09-25 DIAGNOSIS — R1319 Other dysphagia: Secondary | ICD-10-CM | POA: Diagnosis not present

## 2017-09-25 DIAGNOSIS — M25631 Stiffness of right wrist, not elsewhere classified: Secondary | ICD-10-CM | POA: Diagnosis not present

## 2017-09-25 DIAGNOSIS — F331 Major depressive disorder, recurrent, moderate: Secondary | ICD-10-CM | POA: Diagnosis not present

## 2017-09-25 DIAGNOSIS — S62101A Fracture of unspecified carpal bone, right wrist, initial encounter for closed fracture: Secondary | ICD-10-CM | POA: Diagnosis not present

## 2017-09-25 DIAGNOSIS — S72001D Fracture of unspecified part of neck of right femur, subsequent encounter for closed fracture with routine healing: Secondary | ICD-10-CM | POA: Diagnosis not present

## 2017-09-25 DIAGNOSIS — R262 Difficulty in walking, not elsewhere classified: Secondary | ICD-10-CM | POA: Diagnosis not present

## 2017-09-25 DIAGNOSIS — S72031D Displaced midcervical fracture of right femur, subsequent encounter for closed fracture with routine healing: Secondary | ICD-10-CM | POA: Diagnosis not present

## 2017-09-25 DIAGNOSIS — E46 Unspecified protein-calorie malnutrition: Secondary | ICD-10-CM | POA: Diagnosis not present

## 2017-09-25 DIAGNOSIS — D649 Anemia, unspecified: Secondary | ICD-10-CM | POA: Diagnosis not present

## 2017-09-25 DIAGNOSIS — S52501D Unspecified fracture of the lower end of right radius, subsequent encounter for closed fracture with routine healing: Secondary | ICD-10-CM | POA: Diagnosis not present

## 2017-09-25 DIAGNOSIS — D696 Thrombocytopenia, unspecified: Secondary | ICD-10-CM | POA: Diagnosis not present

## 2017-09-25 MED ORDER — POLYETHYLENE GLYCOL 3350 17 G PO PACK: 17.0000 g | PACK | Freq: Every day | ORAL | Status: AC

## 2017-09-25 NOTE — Social Work (Signed)
Clinical Social Worker facilitated patient discharge including contacting patient family and facility to confirm patient discharge plans.  Clinical information faxed to facility and family agreeable with plan.    CSW arranged ambulance transport via [facility] to [facility] .    RN to call [phone] to give report prior to discharge.  Clinical Social Worker will sign off for now as social work intervention is no longer needed. Please consult us again if new need arises.  Michale Weikel, LCSW Clinical Social Worker [phone]    

## 2017-09-25 NOTE — Progress Notes (Signed)
Report was called to [place] Music therapist) at [facility].  Written instructions will be sent via [facility] along with prescriptions.  Patient's daughter Corrie Dandy is aware of the discharged to [facility].

## 2017-09-25 NOTE — Social Work (Signed)
CSW f/u on disposition.  Family accepted SNF bed at [facility].  CSW will assist with discharge to SNF.  Keene Breath, LCSW Clinical Social Worker [phone]

## 2017-09-25 NOTE — Discharge Summary (Signed)
Physician Discharge Summary  Rebecca [name]:[id number] DOB: Jan 05, 1926  PCP: Daisy Floro, MD  Admit date: 09/19/2017 Discharge date: 09/25/2017  Recommendations for Outpatient Follow-up:  1. MD at SNF in 3 days with repeat labs (CBC & BMP). 2. Dr. Samson Frederic, Orthopedics in 2 weeks for postop follow-up.  SNF to coordinate. 3. Dr. Betha Loa, Orthopedics/hand surgery in 1 week from day of surgery (09/20/2017) for postop follow-up.  SNF to coordinate. 4. Dr. Dorthey Sawyer, PCP upon discharge from SNF.  Home Health: N/A Equipment/Devices: Right upper extremity sling.  Discharge Condition: Improved and stable. CODE STATUS: DNR. Diet recommendation: Heart healthy diet.  Discharge Diagnoses:  Principal Problem:   Closed right hip fracture, initial encounter (HCC) Active Problems:   Essential hypertension   Aortic stenosis, severe   PAF (paroxysmal atrial fibrillation) (HCC)   Dementia   Right wrist fracture, open, initial encounter   Displaced fracture of right femoral neck (HCC)   Brief Summary: 84 year old female with PMH of PAF on Eliquis, severe aortic stenosis status post TAVR, HTN, dementia, presented to the ED following mechanical fall and was found to have right hip fracture and right open wrist fracture.  Orthopedics and hand surgery were consulted and she underwent surgical repair on 5/24.  Assessment and plan:  1. Closed right hip fracture/displaced right femoral neck fracture: Orthopedics was consulted and patient underwent right hip hemiarthroplasty, anterior approach on 5/24.  Orthopedics have seen and cleared her for discharge to SNF.  Continue weightbearing as tolerated, Eliquis for DVT prophylaxis and pain management. 2. Right extra-articular distal radius fracture: Status post ORIF 09/20/2017.  Continue nonweightbearing until outpatient follow-up with Dr. Merlyn Lot in 1 week from date of surgery. 3. Status post I&D and reduction of right distal ulnar  fracture without fixation on 5/24: Outpatient follow-up with Dr. Merlyn Lot. 4. Postop acute blood loss anemia: Stable and did not require blood transfusion.  Follow CBC as outpatient. 5. Transient mild thrombocytopenia: Likely related to acute blood loss.  Resolved. 6. Fever: Noted an episode of fever on 09/20/2017.  Resolved without recurrence and low index of suspicion for infectious etiology. 7. Mild acute kidney injury: Likely hemodynamically mediated.  Resolved after IV fluids. 8. PAF: Clinically in sinus rhythm without use of rate control medications.  Continue Eliquis. 9. Severe AS status post TAVR: Stable. 10. Chronic diastolic CHF: Compensated and not on diuretics PTA. 11. Essential hypertension: Blood pressure starting to climb up.  Resume amlodipine at discharge. 12. Dementia and depression: Continue prior home medications.  Stable. 13. ??  Chronic cystitis/recurrent UTI: Patient may be on trimethoprim suppression related to this.  No features of acute infection.  Outpatient follow-up.   Consultations:  Orthopedics and hand surgery  Procedures:  As indicated above   Discharge Instructions  Discharge Instructions    Call MD for:  difficulty breathing, headache or visual disturbances   Complete by:  As directed    Call MD for:  extreme fatigue   Complete by:  As directed    Call MD for:  persistant dizziness or light-headedness   Complete by:  As directed    Call MD for:  persistant nausea and vomiting   Complete by:  As directed    Call MD for:  redness, tenderness, or signs of infection (pain, swelling, redness, odor or green/yellow discharge around incision site)   Complete by:  As directed    Call MD for:  severe uncontrolled pain   Complete by:  As directed  Call MD for:  temperature >100.4   Complete by:  As directed    Diet - low sodium heart healthy   Complete by:  As directed    Increase activity slowly   Complete by:  As directed        Medication List     STOP taking these medications   LOPERAMIDE A-D 2 MG tablet Generic drug:  loperamide   MINTOX 200-200-20 MG/5ML suspension Generic drug:  alum & mag hydroxide-simeth     TAKE these medications   amLODipine 5 MG tablet Commonly known as:  NORVASC Take 1 tablet (5 mg total) by mouth daily with breakfast.   Cranberry 450 MG Tabs Take 450 mg by mouth 2 (two) times daily.   ELIQUIS 2.5 MG Tabs tablet Generic drug:  apixaban TAKE 1 TABLET (2.5 MG TOTAL) BY MOUTH 2 (TWO) TIMES DAILY.   estradiol 0.1 MG/GM vaginal cream Commonly known as:  ESTRACE Place 1 Applicatorful vaginally once a week.   guaifenesin 100 MG/5ML syrup Commonly known as:  ROBITUSSIN Take 200 mg by mouth every 6 (six) hours as needed for cough.   magnesium hydroxide 400 MG/5ML suspension Commonly known as:  MILK OF MAGNESIA Take 30 mLs by mouth daily as needed for mild constipation.   MAPAP 500 MG coapsule Generic drug:  Acetaminophen Take 500 mg by mouth every 4 (four) hours as needed for fever.   memantine 10 MG tablet Commonly known as:  NAMENDA TAKE 1 TABLET (10 MG TOTAL) BY MOUTH 2 (TWO) TIMES DAILY.   mirtazapine 30 MG tablet Commonly known as:  REMERON Take 30 mg by mouth at bedtime.   neomycin-bacitracin-polymyxin ointment Commonly known as:  NEOSPORIN Apply 1 application topically as needed for wound care. Skin tears and Abrasions   NUTRITIONAL SUPPLEMENT Liqd Take 1 Bottle by mouth 3 (three) times daily with meals. *House Shake*   polyethylene glycol packet Commonly known as:  MIRALAX / GLYCOLAX Take 17 g by mouth daily.   sertraline 100 MG tablet Commonly known as:  ZOLOFT Take 100 mg by mouth daily with breakfast.   traMADol 50 MG tablet Commonly known as:  ULTRAM Take 1 tablet (50 mg total) by mouth every 6 (six) hours as needed for moderate pain.   trimethoprim 100 MG tablet Commonly known as:  TRIMPEX Take 100 mg by mouth at bedtime.       Contact information for  follow-up providers    Swinteck, Arlys John, MD. Schedule an appointment as soon as possible for a visit in 2 weeks.   Specialty:  Orthopedic Surgery Why:  For wound re-check Contact information: [address] [place] [address] [phone]        Betha Loa, MD In 1 week.   Specialty:  Orthopedic Surgery Contact information: [address] [place] [address] [phone]        MD at SNF. Schedule an appointment as soon as possible for a visit in 3 day(s).   Why:  To be seen with repeat labs (CBC & BMP).       Daisy Floro, MD. Schedule an appointment as soon as possible for a visit.   Specialty:  Family Medicine Why:  Upon discharge from SNF. Contact information: [address] [place] [address] [phone]            Contact information for after-discharge care    Destination    [facility] .   Service:  Skilled Nursing Contact information: [phone]  [address] [place] [address] [phone]                 No Known Allergies    Procedures/Studies: Dg Chest 1 View  Result Date: 09/19/2017 CLINICAL DATA:  Weakness. The patient suffered a right hip fracture due to a fall today. EXAM: CHEST  1 VIEW COMPARISON:  PA and lateral chest 11/12/2016. FINDINGS: The lungs are clear. Heart size is upper normal. Aortic atherosclerosis is identified. The patient is status post aortic valve repair. No pneumothorax or pleural effusion. No acute bony abnormality. IMPRESSION: No acute disease. Atherosclerosis. Electronically Signed   By: Drusilla Kanner M.D.   On: 09/19/2017 11:45   Dg Wrist Complete Right  Result Date: 09/19/2017 CLINICAL DATA:  Fall.  Wrist injury. EXAM: RIGHT WRIST - COMPLETE 3+ VIEW COMPARISON:  No recent prior. FINDINGS: Severely displaced comminuted distal right radial metaphyseal fracture is present. Displaced distal ulnar fracture is present. Prominent overlying of fracture fragments  noted. Diffuse osteopenia and degenerative change. IMPRESSION: Severely displaced and comminuted distal right radial metaphyseal fracture. Displaced distal ulnar fracture is also present. Fracture fragments are overriding. Electronically Signed   By: Maisie Fus  Register   On: 09/19/2017 11:48   Pelvis Portable  Result Date: 09/20/2017 CLINICAL DATA:  Status post hip surgery EXAM: PORTABLE PELVIS 1-2 VIEWS COMPARISON:  09/19/2017 FINDINGS: Lobulated calcifications in the left pelvis likely fibroids. Interval right hip hemiarthroplasty with normal alignment. Gas in the soft tissues consistent with recent operative status. IMPRESSION: Status post right hip hemiarthroplasty with expected postsurgical changes. Probable calcified fibroids in the pelvis Electronically Signed   By: Jasmine Pang M.D.   On: 09/20/2017 20:09   Dg Ankle Right Port  Result Date: 09/23/2017 CLINICAL DATA:  Encounter for right ankle pain, pt has a hx of fall with multiple fxs. Only AP and lateral images obtained per ordering MD. EXAM: PORTABLE RIGHT ANKLE - 2 VIEW COMPARISON:  None. FINDINGS: No fracture.  No bone lesion. Ankle joint is normally spaced and aligned. No arthropathic changes. Bones are demineralized. There is diffuse soft tissue swelling as well as arterial vascular calcifications. IMPRESSION: No fracture, bone lesion or ankle joint abnormality. Electronically Signed   By: Amie Portland M.D.   On: 09/23/2017 08:52   Dg Hand Complete Right  Result Date: 09/19/2017 CLINICAL DATA:  Fall.  Wrist injury. EXAM: RIGHT HAND - COMPLETE 3+ VIEW COMPARISON:  No prior. FINDINGS: Comminuted displaced overriding fractures are noted of the distal radius and ulna. Diffuse osteopenia degenerative change. No radiopaque foreign body. Peripheral vascular calcification. IMPRESSION: 1. Displaced comminuted angulated fractures of the distal radius and ulna. 2.  Peripheral vascular disease. Electronically Signed   By: Maisie Fus  Register   On:  09/19/2017 11:49   Dg C-arm 1-60 Min  Result Date: 09/20/2017 CLINICAL DATA:  Status post total hip replacement on the right for fracture EXAM: DG C-ARM 61-120 MIN; OPERATIVE RIGHT HIP WITH PELVIS COMPARISON:  Preoperative pelvis and right hip images Sep 19, 2017 FLUOROSCOPY TIME:  0 minutes 8 seconds; 5 acquired images FINDINGS: A series of frontal images were obtained showing total hip replacement on the right. Prosthetic components appear well seated on frontal view. Postoperatively, no fracture or dislocation is seen. IMPRESSION: Total hip replacement on the right with prosthetic components appearing well-seated on frontal view. No acute fracture or dislocation postoperatively. Electronically Signed   By: Bretta Bang III M.D.   On: 09/20/2017 16:24   Dg Hip Operative Unilat W Or  W/o Pelvis Right  Result Date: 09/20/2017 CLINICAL DATA:  Status post total hip replacement on the right for fracture EXAM: DG C-ARM 61-120 MIN; OPERATIVE RIGHT HIP WITH PELVIS COMPARISON:  Preoperative pelvis and right hip images Sep 19, 2017 FLUOROSCOPY TIME:  0 minutes 8 seconds; 5 acquired images FINDINGS: A series of frontal images were obtained showing total hip replacement on the right. Prosthetic components appear well seated on frontal view. Postoperatively, no fracture or dislocation is seen. IMPRESSION: Total hip replacement on the right with prosthetic components appearing well-seated on frontal view. No acute fracture or dislocation postoperatively. Electronically Signed   By: Bretta Bang III M.D.   On: 09/20/2017 16:24   Dg Hip Unilat With Pelvis 2-3 Views Right  Result Date: 09/19/2017 CLINICAL DATA:  Right hip pain after fall. EXAM: DG HIP (WITH OR WITHOUT PELVIS) 2-3V RIGHT COMPARISON:  CT abdomen pelvis dated November 13, 2016. FINDINGS: Acute, displaced fracture through the right femoral neck. No dislocation. The left hip is unremarkable. The pubic symphysis and sacroiliac joints are intact.  Osteopenia. Unchanged calcifications in the left pelvis associated with known large dermoid. IMPRESSION: 1. Acute, displaced right femoral neck fracture. Electronically Signed   By: Obie Dredge M.D.   On: 09/19/2017 11:47      Subjective: Patient reports appropriate postop right hip pain.  Denies pain in the right upper extremity.  Denies any other complaints.  As per RN, no acute issues reported.  Discharge Exam:  Vitals:   09/24/17 0556 09/24/17 1312 09/24/17 1943 09/25/17 0411  BP: (!) 143/68 119/68 (!) 156/76 (!) 162/83  Pulse: 81 98 92 94  Resp:  18    Temp: 98.1 F (36.7 C) 98.6 F (37 C) 98.2 F (36.8 C) 98.7 F (37.1 C)  TempSrc: Oral Oral Oral   SpO2: 94% 95% 93% 95%  Weight:      Height:        General: Pleasant elderly female, moderately built and nourished, sitting up comfortably in bed without distress. Cardiovascular: S1 & S2 heard, RRR, S1/S2 +. No murmurs, rubs, gallops or clicks. No JVD or pedal edema. Respiratory: Clear to auscultation without wheezing, rhonchi or crackles. No increased work of breathing. Abdominal:  Non distended, non tender & soft. No organomegaly or masses appreciated. Normal bowel sounds heard. CNS: Alert and oriented to person and partly to place but not to time. No focal deficits. Extremities: no edema, no cyanosis.  Right upper extremity/forearm and hand postop dressing clean and dry.  Mild swelling of fingers but able to move without difficulty and color and capillary refill appear normal.  Right upper extremity sling.  Right hip postop dressing clean and dry.  Limited movements of right lower extremity secondary to pain but otherwise no clear focal neurological deficits.    The results of significant diagnostics from this hospitalization (including imaging, microbiology, ancillary and laboratory) are listed below for reference.     Microbiology: Recent Results (from the past 240 hour(s))  Surgical pcr screen     Status: None    Collection Time: 09/19/17  8:00 PM  Result Value Ref Range Status   MRSA, PCR NEGATIVE NEGATIVE Final   Staphylococcus aureus NEGATIVE NEGATIVE Final    Comment: (NOTE) The Xpert SA Assay (FDA approved for NASAL specimens in patients 65 years of age and older), is one component of a comprehensive surveillance program. It is not intended to diagnose infection nor to guide or monitor treatment. Performed at [facility], [address] [place], [place] [address]      Labs: CBC: Recent Labs  Lab 09/19/17 1058 09/20/17 0429 09/21/17 0640 09/22/17 0509 09/23/17 0323  WBC 11.5* 11.0* 13.7* 9.9 9.3  NEUTROABS 7.5  --   --   --   --   HGB 13.6 12.5 9.5* 8.6* 9.7*  HCT 42.3 39.3 30.0* 27.8* 31.5*  MCV 83.9 84.5 85.0 84.8 87.7  PLT 217 166 142* 130* 150   Basic Metabolic Panel: Recent Labs  Lab 09/19/17 1205 09/20/17 0429 09/21/17 0640 09/22/17 0509 09/23/17 0323  NA 142 142 137 136 138  K 3.7 3.5 3.6 3.4* 3.8  CL 105 107 104 104 107  CO2 GLUCOSE 145* 122* 136* 109* 91  BUN CREATININE 1.06* 0.78 1.22* 0.93 0.75  CALCIUM 8.7* 8.4* 7.8* 7.4* 8.0*   Liver Function Tests: Recent Labs  Lab 09/19/17 1205  AST 29  ALT 16  ALKPHOS 102  BILITOT 1.0  PROT 6.6  ALBUMIN 3.4*        Time coordinating discharge: 40 minutes  SIGNED:  Marcellus Scott, MD, FACP, [facility] Pager [phone]  If 7PM-7AM, please contact night-coverage [email or web address] Password [facility] 09/25/2017, 11:17 AM

## 2017-09-25 NOTE — Clinical Social Work Placement (Signed)
   CLINICAL SOCIAL WORK PLACEMENT  NOTE  Date:  09/25/2017  Patient Details  Name: Rebecca [name] MRN: [id number] Date of Birth: 02-28-1926  Clinical Social Work is seeking post-discharge placement for this patient at the Skilled  Nursing Facility level of care (*CSW will initial, date and re-position this form in  chart as items are completed):  Yes   Patient/family provided with [facility] Clinical Social Work Department's list of facilities offering this level of care within the geographic area requested by the patient (or if unable, by the patient's family).  Yes   Patient/family informed of their freedom to choose among providers that offer the needed level of care, that participate in Medicare, Medicaid or managed care program needed by the patient, have an available bed and are willing to accept the patient.  Yes   Patient/family informed of [facility]'s ownership interest in [facility] and [facility], as well as of the fact that they are under no obligation to receive care at these facilities.  PASRR submitted to EDS on       PASRR number received on       Existing PASRR number confirmed on       FL2 transmitted to all facilities in geographic area requested by pt/family on       FL2 transmitted to all facilities within larger geographic area on 09/22/17     Patient informed that his/her managed care company has contracts with or will negotiate with certain facilities, including the following:        Yes   Patient/family informed of bed offers received.  Patient chooses bed at [facility]     Physician recommends and patient chooses bed at      Patient to be transferred to [facility] on 09/25/17.  Patient to be transferred to facility by [facility]     Patient family notified on 09/25/17 of transfer.  Name of family member notified:  son-n-law Bill     PHYSICIAN       Additional Comment:     _______________________________________________ Tresa Moore, LCSW 09/25/2017, 11:32 AM

## 2017-09-26 DIAGNOSIS — S72001A Fracture of unspecified part of neck of right femur, initial encounter for closed fracture: Secondary | ICD-10-CM | POA: Diagnosis not present

## 2017-09-26 DIAGNOSIS — I509 Heart failure, unspecified: Secondary | ICD-10-CM | POA: Diagnosis not present

## 2017-09-26 DIAGNOSIS — K5900 Constipation, unspecified: Secondary | ICD-10-CM | POA: Diagnosis not present

## 2017-09-26 DIAGNOSIS — F322 Major depressive disorder, single episode, severe without psychotic features: Secondary | ICD-10-CM | POA: Diagnosis not present

## 2017-09-26 DIAGNOSIS — D696 Thrombocytopenia, unspecified: Secondary | ICD-10-CM | POA: Diagnosis not present

## 2017-09-26 DIAGNOSIS — I4891 Unspecified atrial fibrillation: Secondary | ICD-10-CM | POA: Diagnosis not present

## 2017-09-26 DIAGNOSIS — F0390 Unspecified dementia without behavioral disturbance: Secondary | ICD-10-CM | POA: Diagnosis not present

## 2017-09-26 DIAGNOSIS — R2689 Other abnormalities of gait and mobility: Secondary | ICD-10-CM | POA: Diagnosis not present

## 2017-09-26 DIAGNOSIS — D649 Anemia, unspecified: Secondary | ICD-10-CM | POA: Diagnosis not present

## 2017-09-26 DIAGNOSIS — S62101A Fracture of unspecified carpal bone, right wrist, initial encounter for closed fracture: Secondary | ICD-10-CM | POA: Diagnosis not present

## 2017-10-08 DIAGNOSIS — S5291XA Unspecified fracture of right forearm, initial encounter for closed fracture: Secondary | ICD-10-CM | POA: Diagnosis not present

## 2017-10-08 DIAGNOSIS — S52201A Unspecified fracture of shaft of right ulna, initial encounter for closed fracture: Secondary | ICD-10-CM | POA: Diagnosis not present

## 2017-10-09 DIAGNOSIS — S72031D Displaced midcervical fracture of right femur, subsequent encounter for closed fracture with routine healing: Secondary | ICD-10-CM | POA: Diagnosis not present

## 2017-10-09 DIAGNOSIS — Z471 Aftercare following joint replacement surgery: Secondary | ICD-10-CM | POA: Diagnosis not present

## 2017-10-09 DIAGNOSIS — Z4789 Encounter for other orthopedic aftercare: Secondary | ICD-10-CM | POA: Diagnosis not present

## 2017-10-23 DIAGNOSIS — M25531 Pain in right wrist: Secondary | ICD-10-CM | POA: Diagnosis not present

## 2017-10-23 DIAGNOSIS — M25631 Stiffness of right wrist, not elsewhere classified: Secondary | ICD-10-CM | POA: Diagnosis not present

## 2017-10-23 DIAGNOSIS — S52551D Other extraarticular fracture of lower end of right radius, subsequent encounter for closed fracture with routine healing: Secondary | ICD-10-CM | POA: Diagnosis not present

## 2017-11-07 DIAGNOSIS — F0390 Unspecified dementia without behavioral disturbance: Secondary | ICD-10-CM | POA: Diagnosis not present

## 2017-11-07 DIAGNOSIS — I4891 Unspecified atrial fibrillation: Secondary | ICD-10-CM | POA: Diagnosis not present

## 2017-11-07 DIAGNOSIS — D649 Anemia, unspecified: Secondary | ICD-10-CM | POA: Diagnosis not present

## 2017-11-07 DIAGNOSIS — E46 Unspecified protein-calorie malnutrition: Secondary | ICD-10-CM | POA: Diagnosis not present

## 2017-11-07 DIAGNOSIS — S62101A Fracture of unspecified carpal bone, right wrist, initial encounter for closed fracture: Secondary | ICD-10-CM | POA: Diagnosis not present

## 2017-11-15 DIAGNOSIS — Z96641 Presence of right artificial hip joint: Secondary | ICD-10-CM | POA: Diagnosis not present

## 2017-11-15 DIAGNOSIS — Z471 Aftercare following joint replacement surgery: Secondary | ICD-10-CM | POA: Diagnosis not present

## 2017-11-15 DIAGNOSIS — S72031D Displaced midcervical fracture of right femur, subsequent encounter for closed fracture with routine healing: Secondary | ICD-10-CM | POA: Diagnosis not present

## 2017-11-18 DIAGNOSIS — S52551D Other extraarticular fracture of lower end of right radius, subsequent encounter for closed fracture with routine healing: Secondary | ICD-10-CM | POA: Diagnosis not present

## 2017-11-18 DIAGNOSIS — S52691E Other fracture of lower end of right ulna, subsequent encounter for open fracture type I or II with routine healing: Secondary | ICD-10-CM | POA: Diagnosis not present

## 2018-01-08 ENCOUNTER — Encounter (INDEPENDENT_AMBULATORY_CARE_PROVIDER_SITE_OTHER): Payer: Medicare Other | Admitting: Ophthalmology

## 2018-01-09 DIAGNOSIS — F322 Major depressive disorder, single episode, severe without psychotic features: Secondary | ICD-10-CM | POA: Diagnosis not present

## 2018-01-09 DIAGNOSIS — E46 Unspecified protein-calorie malnutrition: Secondary | ICD-10-CM | POA: Diagnosis not present

## 2018-01-09 DIAGNOSIS — F0390 Unspecified dementia without behavioral disturbance: Secondary | ICD-10-CM | POA: Diagnosis not present

## 2018-01-09 DIAGNOSIS — I4891 Unspecified atrial fibrillation: Secondary | ICD-10-CM | POA: Diagnosis not present

## 2018-01-09 DIAGNOSIS — K5900 Constipation, unspecified: Secondary | ICD-10-CM | POA: Diagnosis not present

## 2018-01-09 DIAGNOSIS — I509 Heart failure, unspecified: Secondary | ICD-10-CM | POA: Diagnosis not present

## 2018-02-03 DIAGNOSIS — F0390 Unspecified dementia without behavioral disturbance: Secondary | ICD-10-CM | POA: Diagnosis not present

## 2018-02-03 DIAGNOSIS — I10 Essential (primary) hypertension: Secondary | ICD-10-CM | POA: Diagnosis not present

## 2018-02-03 DIAGNOSIS — I5032 Chronic diastolic (congestive) heart failure: Secondary | ICD-10-CM | POA: Diagnosis not present

## 2018-02-03 DIAGNOSIS — R2689 Other abnormalities of gait and mobility: Secondary | ICD-10-CM | POA: Diagnosis not present

## 2018-02-03 DIAGNOSIS — R1319 Other dysphagia: Secondary | ICD-10-CM | POA: Diagnosis not present

## 2018-02-03 DIAGNOSIS — I739 Peripheral vascular disease, unspecified: Secondary | ICD-10-CM | POA: Diagnosis not present

## 2018-02-03 DIAGNOSIS — F331 Major depressive disorder, recurrent, moderate: Secondary | ICD-10-CM | POA: Diagnosis not present

## 2018-02-03 DIAGNOSIS — M6281 Muscle weakness (generalized): Secondary | ICD-10-CM | POA: Diagnosis not present

## 2018-02-03 DIAGNOSIS — R41841 Cognitive communication deficit: Secondary | ICD-10-CM | POA: Diagnosis not present

## 2018-02-04 DIAGNOSIS — F0390 Unspecified dementia without behavioral disturbance: Secondary | ICD-10-CM | POA: Diagnosis not present

## 2018-02-04 DIAGNOSIS — R41841 Cognitive communication deficit: Secondary | ICD-10-CM | POA: Diagnosis not present

## 2018-02-04 DIAGNOSIS — R2689 Other abnormalities of gait and mobility: Secondary | ICD-10-CM | POA: Diagnosis not present

## 2018-02-04 DIAGNOSIS — F331 Major depressive disorder, recurrent, moderate: Secondary | ICD-10-CM | POA: Diagnosis not present

## 2018-02-04 DIAGNOSIS — M6281 Muscle weakness (generalized): Secondary | ICD-10-CM | POA: Diagnosis not present

## 2018-02-04 DIAGNOSIS — R1319 Other dysphagia: Secondary | ICD-10-CM | POA: Diagnosis not present

## 2018-02-05 DIAGNOSIS — R41841 Cognitive communication deficit: Secondary | ICD-10-CM | POA: Diagnosis not present

## 2018-02-05 DIAGNOSIS — M6281 Muscle weakness (generalized): Secondary | ICD-10-CM | POA: Diagnosis not present

## 2018-02-05 DIAGNOSIS — R2689 Other abnormalities of gait and mobility: Secondary | ICD-10-CM | POA: Diagnosis not present

## 2018-02-05 DIAGNOSIS — F0390 Unspecified dementia without behavioral disturbance: Secondary | ICD-10-CM | POA: Diagnosis not present

## 2018-02-05 DIAGNOSIS — R1319 Other dysphagia: Secondary | ICD-10-CM | POA: Diagnosis not present

## 2018-02-05 DIAGNOSIS — F331 Major depressive disorder, recurrent, moderate: Secondary | ICD-10-CM | POA: Diagnosis not present

## 2018-02-06 DIAGNOSIS — R1319 Other dysphagia: Secondary | ICD-10-CM | POA: Diagnosis not present

## 2018-02-06 DIAGNOSIS — M6281 Muscle weakness (generalized): Secondary | ICD-10-CM | POA: Diagnosis not present

## 2018-02-06 DIAGNOSIS — R41841 Cognitive communication deficit: Secondary | ICD-10-CM | POA: Diagnosis not present

## 2018-02-06 DIAGNOSIS — R2689 Other abnormalities of gait and mobility: Secondary | ICD-10-CM | POA: Diagnosis not present

## 2018-02-06 DIAGNOSIS — F331 Major depressive disorder, recurrent, moderate: Secondary | ICD-10-CM | POA: Diagnosis not present

## 2018-02-06 DIAGNOSIS — F0390 Unspecified dementia without behavioral disturbance: Secondary | ICD-10-CM | POA: Diagnosis not present

## 2018-02-07 DIAGNOSIS — F0390 Unspecified dementia without behavioral disturbance: Secondary | ICD-10-CM | POA: Diagnosis not present

## 2018-02-07 DIAGNOSIS — R1319 Other dysphagia: Secondary | ICD-10-CM | POA: Diagnosis not present

## 2018-02-07 DIAGNOSIS — M6281 Muscle weakness (generalized): Secondary | ICD-10-CM | POA: Diagnosis not present

## 2018-02-07 DIAGNOSIS — R41841 Cognitive communication deficit: Secondary | ICD-10-CM | POA: Diagnosis not present

## 2018-02-07 DIAGNOSIS — F331 Major depressive disorder, recurrent, moderate: Secondary | ICD-10-CM | POA: Diagnosis not present

## 2018-02-07 DIAGNOSIS — R2689 Other abnormalities of gait and mobility: Secondary | ICD-10-CM | POA: Diagnosis not present

## 2018-02-10 ENCOUNTER — Encounter (HOSPITAL_COMMUNITY): Payer: Self-pay | Admitting: Pharmacy Technician

## 2018-02-10 ENCOUNTER — Emergency Department (HOSPITAL_COMMUNITY): Payer: Medicare Other

## 2018-02-10 ENCOUNTER — Emergency Department (HOSPITAL_COMMUNITY)
Admission: EM | Admit: 2018-02-10 | Discharge: 2018-02-10 | Disposition: A | Discharge status: Home / Self Care | Payer: Medicare Other | Attending: Emergency Medicine | Admitting: Emergency Medicine

## 2018-02-10 ENCOUNTER — Other Ambulatory Visit: Payer: Self-pay

## 2018-02-10 DIAGNOSIS — S199XXA Unspecified injury of neck, initial encounter: Secondary | ICD-10-CM | POA: Diagnosis not present

## 2018-02-10 DIAGNOSIS — I10 Essential (primary) hypertension: Secondary | ICD-10-CM | POA: Insufficient documentation

## 2018-02-10 DIAGNOSIS — F0390 Unspecified dementia without behavioral disturbance: Secondary | ICD-10-CM | POA: Diagnosis not present

## 2018-02-10 DIAGNOSIS — Y92129 Unspecified place in nursing home as the place of occurrence of the external cause: Secondary | ICD-10-CM | POA: Diagnosis not present

## 2018-02-10 DIAGNOSIS — W050XXA Fall from non-moving wheelchair, initial encounter: Secondary | ICD-10-CM | POA: Insufficient documentation

## 2018-02-10 DIAGNOSIS — M25551 Pain in right hip: Secondary | ICD-10-CM | POA: Diagnosis not present

## 2018-02-10 DIAGNOSIS — Z743 Need for continuous supervision: Secondary | ICD-10-CM | POA: Diagnosis not present

## 2018-02-10 DIAGNOSIS — Y998 Other external cause status: Secondary | ICD-10-CM | POA: Insufficient documentation

## 2018-02-10 DIAGNOSIS — E7800 Pure hypercholesterolemia, unspecified: Secondary | ICD-10-CM | POA: Insufficient documentation

## 2018-02-10 DIAGNOSIS — Z79899 Other long term (current) drug therapy: Secondary | ICD-10-CM | POA: Diagnosis not present

## 2018-02-10 DIAGNOSIS — E785 Hyperlipidemia, unspecified: Secondary | ICD-10-CM | POA: Insufficient documentation

## 2018-02-10 DIAGNOSIS — S0990XA Unspecified injury of head, initial encounter: Secondary | ICD-10-CM | POA: Diagnosis not present

## 2018-02-10 DIAGNOSIS — W19XXXA Unspecified fall, initial encounter: Secondary | ICD-10-CM | POA: Diagnosis not present

## 2018-02-10 DIAGNOSIS — N3000 Acute cystitis without hematuria: Secondary | ICD-10-CM | POA: Insufficient documentation

## 2018-02-10 DIAGNOSIS — Z7901 Long term (current) use of anticoagulants: Secondary | ICD-10-CM | POA: Insufficient documentation

## 2018-02-10 DIAGNOSIS — Z87891 Personal history of nicotine dependence: Secondary | ICD-10-CM | POA: Diagnosis not present

## 2018-02-10 DIAGNOSIS — Y9389 Activity, other specified: Secondary | ICD-10-CM | POA: Diagnosis not present

## 2018-02-10 DIAGNOSIS — R52 Pain, unspecified: Secondary | ICD-10-CM | POA: Diagnosis not present

## 2018-02-10 DIAGNOSIS — I480 Paroxysmal atrial fibrillation: Secondary | ICD-10-CM | POA: Insufficient documentation

## 2018-02-10 DIAGNOSIS — S79911A Unspecified injury of right hip, initial encounter: Secondary | ICD-10-CM | POA: Diagnosis not present

## 2018-02-10 DIAGNOSIS — R279 Unspecified lack of coordination: Secondary | ICD-10-CM | POA: Diagnosis not present

## 2018-02-10 LAB — I-STAT CG4 LACTIC ACID, ED
LACTIC ACID, VENOUS: 2.27 mmol/L — AB (ref 0.5–1.9)
Lactic Acid, Venous: 1.58 mmol/L (ref 0.5–1.9)

## 2018-02-10 LAB — BASIC METABOLIC PANEL
ANION GAP: 10 (ref 5–15)
BUN: 15 mg/dL (ref 8–23)
CHLORIDE: 104 mmol/L (ref 98–111)
CO2: 24 mmol/L (ref 22–32)
CREATININE: 1.17 mg/dL — AB (ref 0.44–1.00)
Calcium: 9.7 mg/dL (ref 8.9–10.3)
GFR calc non Af Amer: 40 mL/min — ABNORMAL LOW (ref >60)
GFR, EST AFRICAN AMERICAN: 46 mL/min — AB (ref >60)
GLUCOSE: 110 mg/dL — AB (ref 70–99)
Potassium: 3.6 mmol/L (ref 3.5–5.1)
Sodium: 138 mmol/L (ref 135–145)

## 2018-02-10 LAB — CBC WITH DIFFERENTIAL/PLATELET
Abs Immature Granulocytes: 0.03 10*3/uL (ref 0.00–0.07)
BASOS ABS: 0 10*3/uL (ref 0.0–0.1)
Basophils Relative: 0 %
EOS PCT: 1 %
Eosinophils Absolute: 0.1 10*3/uL (ref 0.0–0.5)
HEMATOCRIT: 43.4 % (ref 36.0–46.0)
HEMOGLOBIN: 12.9 g/dL (ref 12.0–15.0)
IMMATURE GRANULOCYTES: 0 %
LYMPHS ABS: 3.3 10*3/uL (ref 0.7–4.0)
LYMPHS PCT: 28 %
MCH: 24 pg — ABNORMAL LOW (ref 26.0–34.0)
MCHC: 29.7 g/dL — AB (ref 30.0–36.0)
MCV: 80.8 fL (ref 80.0–100.0)
Monocytes Absolute: 0.9 10*3/uL (ref 0.1–1.0)
Monocytes Relative: 7 %
NRBC: 0 % (ref 0.0–0.2)
Neutro Abs: 7.4 10*3/uL (ref 1.7–7.7)
Neutrophils Relative %: 64 %
Platelets: 200 10*3/uL (ref 150–400)
RBC: 5.37 MIL/uL — ABNORMAL HIGH (ref 3.87–5.11)
RDW: 16.2 % — ABNORMAL HIGH (ref 11.5–15.5)
WBC: 11.7 10*3/uL — ABNORMAL HIGH (ref 4.0–10.5)

## 2018-02-10 LAB — URINALYSIS, ROUTINE W REFLEX MICROSCOPIC
BILIRUBIN URINE: NEGATIVE
GLUCOSE, UA: NEGATIVE mg/dL
Ketones, ur: NEGATIVE mg/dL
NITRITE: NEGATIVE
PH: 6 (ref 5.0–8.0)
Protein, ur: 30 mg/dL — AB
Specific Gravity, Urine: 1.008 (ref 1.005–1.030)
WBC, UA: >50 WBC/hpf — ABNORMAL HIGH (ref 0–5)

## 2018-02-10 LAB — I-STAT TROPONIN, ED: TROPONIN I, POC: 0.04 ng/mL (ref 0.00–0.08)

## 2018-02-10 MED ORDER — SODIUM CHLORIDE 0.9 % IV BOLUS
500.0000 mL | Freq: Once | INTRAVENOUS | Status: AC
  Administered 2018-02-10: 500 mL via INTRAVENOUS

## 2018-02-10 MED ORDER — CEPHALEXIN 500 MG PO CAPS: 500.0000 mg | ORAL_CAPSULE | Freq: Two times a day (BID) | ORAL | 0 refills | Status: AC

## 2018-02-10 MED ORDER — CEPHALEXIN 250 MG PO CAPS
500.0000 mg | ORAL_CAPSULE | Freq: Once | ORAL | Status: AC
  Administered 2018-02-10: 500 mg via ORAL
  Filled 2018-02-10: qty 2

## 2018-02-10 MED ORDER — SODIUM CHLORIDE 0.9 % IV BOLUS
1000.0000 mL | Freq: Once | INTRAVENOUS | Status: AC
  Administered 2018-02-10: 1000 mL via INTRAVENOUS

## 2018-02-10 NOTE — Discharge Instructions (Addendum)
CT head, neck and xray of right hip were reassuring.   Please take antibiotic for urinary tract infection.   Return to the ER for any new or concerning symptoms like fever, altered mental status.

## 2018-02-10 NOTE — ED Triage Notes (Signed)
Pt bib [facility] from [facility] with reports of mechanical fall while transferring from wheelchair to bed. Pt on Eliquis. Hematoma to R forehead. Hx dementia. Placed in towel collar. Baseline alert self and event. Staff reports pt at her baseline. C/o R sided pain (shoulder, hip, leg). 128/70, HR 64, RR 18. Pt in NAD.

## 2018-02-10 NOTE — ED Notes (Signed)
[facility] called for transport.  

## 2018-02-10 NOTE — ED Notes (Signed)
IV access attempted X2 without success. 

## 2018-02-10 NOTE — ED Provider Notes (Signed)
[facility] EMERGENCY DEPARTMENT Provider Note   CSN: [id number] Arrival date & time: 02/10/18  1423     History   Chief Complaint Chief Complaint  Patient presents with  . Fall    HPI Rahma Meller is a 84 y.o. female.  HPI   Ann-Marie Kluge is an 84yo female with a history of dementia, paroxysmal atrial fibrillation (on Xarelto), depression, recurrent falls who presents to the emergency department via EMS from her nursing facility after having a witnessed mechanical fall earlier today.  According to EMS report patient fell and hit her head while transferring from wheelchair to bed.  No witnessed loss of consciousness.  Staff reported that patient is mentating at baseline.  Level 5 caveat applies given patient's underlying dementia.  I spoke to her daughter Corrie Dandy on the phone who states the patient is either in a wheelchair or in bed, does not ambulate on her own.  No recent illness that she is aware of.  When asked, patient denies pain.  Past Medical History:  Diagnosis Date  . Age-related macular degeneration, wet, right eye (HCC)   . Dementia with Lewy bodies (HCC)   . Depression   . Heart block   . Heart murmur    "slight"  . High cholesterol   . Hypertension   . Panic attacks   . Paroxysmal atrial fibrillation (HCC)   . S/P TAVR (transcatheter aortic valve replacement) 02/02/2014   23 mm Edwards Sapien XT transcatheter heart valve placed via open right transfemoral approach  . Syncope 08/21/2012  . UTI (lower urinary tract infection)     Patient Active Problem List   Diagnosis Date Noted  . Displaced fracture of right femoral neck (HCC) 09/20/2017  . Closed right hip fracture, initial encounter (HCC) 09/19/2017  . Right wrist fracture, open, initial encounter 09/19/2017  . Sepsis (HCC)   . HCAP (healthcare-associated pneumonia) 11/12/2016  . Hypokalemia 11/12/2016  . Thrombocytopenia (HCC) 11/12/2016  . E coli bacteremia 06/13/2016  . Sepsis  secondary to UTI (HCC) 06/11/2016  . Moderate episode of recurrent major depressive disorder (HCC)   . Sleep-wake disorder   . Somnolence   . Decreased appetite   . Goals of care, counseling/discussion   . Palliative care by specialist   . Advance care planning   . Pre-syncope 04/16/2016  . Confusion 04/15/2016  . MDD (major depressive disorder), single episode, moderate (HCC) 07/04/2015  . S/P TAVR (transcatheter aortic valve replacement) 02/02/2014  . Severe aortic stenosis 02/02/2014  . Sinus tachycardia 12/04/2013  . Sinus bradycardia 12/04/2013  . Encounter for therapeutic drug monitoring 06/17/2013  . Dementia (HCC) 11/06/2012  . PAF (paroxysmal atrial fibrillation) (HCC) 10/03/2012  . Long term (current) use of anticoagulants 10/03/2012  . Syncope 08/21/2012  . Aortic stenosis, severe 07/09/2012  . Near syncope 07/08/2012  . BRONCHIECTASIS 12/09/2007  . SINUSITIS, CHRONIC 07/07/2007  . BRONCHITIS, CHRONIC 07/07/2007  . HYPERLIPIDEMIA 06/09/2007  . Essential hypertension 06/09/2007  . ALLERGIC RHINITIS 06/09/2007  . OSTEOPOROSIS 06/09/2007    Past Surgical History:  Procedure Laterality Date  . CARDIAC CATHETERIZATION  04/2013  . EYE SURGERY     cataracts  . HIP ARTHROPLASTY Right 09/20/2017   Procedure: RIGHT ARTHROPLASTY BIPOLAR HIP (HEMIARTHROPLASTY);  Surgeon: Samson Frederic, MD;  Location: [facility] OR;  Service: Orthopedics;  Laterality: Right;  . I&D EXTREMITY Right 09/20/2017   Procedure: IRRIGATION AND DEBRIDEMENT Open Wrist Fracture;  Surgeon: Betha Loa, MD;  Location: [facility] OR;  Service: Orthopedics;  Laterality:  Right;  [name] INTRAOPERATIVE TRANSESOPHAGEAL ECHOCARDIOGRAM N/A 02/02/2014   Procedure: INTRAOPERATIVE TRANSESOPHAGEAL ECHOCARDIOGRAM;  Surgeon: Tonny Bollman, MD;  Location: [facility] OR;  Service: Open Heart Surgery;  Laterality: N/A;  . LEFT AND RIGHT HEART CATHETERIZATION WITH CORONARY ANGIOGRAM N/A 05/25/2013   Procedure: LEFT AND RIGHT HEART CATHETERIZATION WITH  CORONARY ANGIOGRAM;  Surgeon: [name] [name], MD;  Location: [facility] CATH LAB;  Service: Cardiovascular;  Laterality: N/A;  . OPEN REDUCTION INTERNAL FIXATION (ORIF) DISTAL RADIAL FRACTURE Right 09/20/2017   Procedure: OPEN REDUCTION INTERNAL FIXATION (ORIF) DISTAL RADIAL FRACTURE, possible Ulna Fracture ORIF;  Surgeon: Betha Loa, MD;  Location: [facility] OR;  Service: Orthopedics;  Laterality: Right;  . TONSILLECTOMY    . TRANSCATHETER AORTIC VALVE REPLACEMENT, TRANSFEMORAL  02/02/2014   Edwards Sapien XT THV (size 23 mm, model # 9300TFX, serial # [id number])  . TRANSCATHETER AORTIC VALVE REPLACEMENT, TRANSFEMORAL N/A 02/02/2014   Procedure: TRANSCATHETER AORTIC VALVE REPLACEMENT, TRANSFEMORAL;  Surgeon: Tonny Bollman, MD;  Location: [facility] OR;  Service: Open Heart Surgery;  Laterality: N/A;     OB History   None      Home Medications    Prior to Admission medications   Medication Sig Start Date End Date Taking? Authorizing Provider  Acetaminophen (MAPAP) 500 MG coapsule Take 500 mg by mouth every 4 (four) hours as needed for fever.    [provider]  amLODipine (NORVASC) 5 MG tablet Take 1 tablet (5 mg total) by mouth daily with breakfast. 05/14/17   [name], [name], MD  Cranberry 450 MG TABS Take 450 mg by mouth 2 (two) times daily.    [provider]  ELIQUIS 2.5 MG TABS tablet TAKE 1 TABLET (2.5 MG TOTAL) BY MOUTH 2 (TWO) TIMES DAILY. 11/08/16   [name], [name], MD  estradiol (ESTRACE) 0.1 MG/GM vaginal cream Place 1 Applicatorful vaginally once a week.     [provider]  guaifenesin (ROBITUSSIN) 100 MG/5ML syrup Take 200 mg by mouth every 6 (six) hours as needed for cough.     [provider]  magnesium hydroxide (MILK OF MAGNESIA) 400 MG/5ML suspension Take 30 mLs by mouth daily as needed for mild constipation.    [provider]  memantine (NAMENDA) 10 MG tablet TAKE 1 TABLET (10 MG TOTAL) BY MOUTH 2 (TWO) TIMES DAILY. 04/16/17   Penumalli, Glenford Bayley, MD   mirtazapine (REMERON) 30 MG tablet Take 30 mg by mouth at bedtime.    [provider]  neomycin-bacitracin-polymyxin (NEOSPORIN) ointment Apply 1 application topically as needed for wound care. Skin tears and Abrasions     [provider]  NUTRITIONAL SUPPLEMENT LIQD Take 1 Bottle by mouth 3 (three) times daily with meals. *House Shake*    [provider]  polyethylene glycol (MIRALAX / GLYCOLAX) packet Take 17 g by mouth daily. 09/25/17   Elease Etienne, MD  sertraline (ZOLOFT) 100 MG tablet Take 100 mg by mouth daily with breakfast.  07/20/15   [provider]  traMADol (ULTRAM) 50 MG tablet Take 1 tablet (50 mg total) by mouth every 6 (six) hours as needed for moderate pain. 09/22/17   Leroy Sea, MD  trimethoprim (TRIMPEX) 100 MG tablet Take 100 mg by mouth at bedtime.  06/24/17   [provider]    Family History Family History  Problem Relation Age of Onset  . Heart attack Mother 39  . Heart Problems Father   . Heart attack Father 58  . Cerebral aneurysm Sister 54  .  Sudden death Brother 69    Social History Social History   Tobacco Use  . Smoking status: Former Smoker    Packs/day: 0.40    Years: 30.00    Pack years: 12.00    Types: Cigarettes    Last attempt to quit: 04/30/1960    Years since quitting: 57.8  . Smokeless tobacco: Never Used  Substance Use Topics  . Alcohol use: No  . Drug use: No     Allergies   Patient has no known allergies.   Review of Systems Review of Systems  Unable to perform ROS: Dementia     Physical Exam Updated Vital Signs BP 123/70   Pulse (!) 103   Temp 98.7 F (37.1 C) (Rectal)   Resp 18   SpO2 97%   Physical Exam  Constitutional: She appears well-developed and well-nourished. No distress.  No acute distress, non-toxic appearing.   HENT:  Head: Normocephalic and atraumatic.  2cm hematoma over right forehead. No raccoon eyes or battle sign. No hemotympanum. No  tenderness over the face or nose. Lips extremely cracked. Mucous membranes dry.   Eyes: Pupils are equal, round, and reactive to light. Conjunctivae are normal. Right eye exhibits no discharge. Left eye exhibits no discharge.  Neck: Normal range of motion. Neck supple.  No midline cervical spine tenderness.   Cardiovascular: Intact distal pulses.  Tachycardic, regular rate.   Pulmonary/Chest: Effort normal and breath sounds normal. No stridor. No respiratory distress. She has no wheezes. She has no rales.  Abdominal: Soft. Bowel sounds are normal. There is no tenderness.  Musculoskeletal:  No midline t-spine or l-spine tenderness. No tenderness to gross palpation over bilateral upper extremities. Mild tenderness over right lateral hip without bruising or swelling. No tenderness to gross palpation over left lower extremity.   Neurological: She is alert. Coordination normal.  Moving all extremities.   Skin: Skin is warm and dry. She is not diaphoretic.  Psychiatric: She has a normal mood and affect. Her behavior is normal.  Nursing note and vitals reviewed.    ED Treatments / Results  Labs (all labs ordered are listed, but only abnormal results are displayed) Labs Reviewed  URINALYSIS, ROUTINE W REFLEX MICROSCOPIC - Abnormal; Notable for the following components:      Result Value   APPearance CLOUDY (*)    Hgb urine dipstick MODERATE (*)    Protein, ur 30 (*)    Leukocytes, UA LARGE (*)    WBC, UA >50 (*)    Bacteria, UA FEW (*)    All other components within normal limits  CBC WITH DIFFERENTIAL/PLATELET - Abnormal; Notable for the following components:   WBC 11.7 (*)    RBC 5.37 (*)    MCH 24.0 (*)    MCHC 29.7 (*)    RDW 16.2 (*)    All other components within normal limits  BASIC METABOLIC PANEL - Abnormal; Notable for the following components:   Glucose, Bld 110 (*)    Creatinine, Ser 1.17 (*)    GFR calc non Af Amer 40 (*)    GFR calc Af Amer 46 (*)    All other  components within normal limits  I-STAT CG4 LACTIC ACID, ED - Abnormal; Notable for the following components:   Lactic Acid, Venous 2.27 (*)    All other components within normal limits  URINE CULTURE  I-STAT CG4 LACTIC ACID, ED  I-STAT TROPONIN, ED    EKG EKG Interpretation  Date/Time:  Monday February 10 2018  15:27:59 EDT Ventricular Rate:  127 PR Interval:    QRS Duration: 122 QT Interval:  408 QTC Calculation: 594 R Axis:   -118 Text Interpretation:  Ectopic atrial tachycardia, unifocal RBBB and LAFB Probable anteroseptal infarct, old Borderline ST depression, lateral leads Since prior ECG, rhythm now appears ectopic, increased from baseline, ST depressions present Confirmed by Alvira Monday ([id number]) on 02/10/2018 8:05:07 PM   Radiology Ct Head Wo Contrast  Result Date: 02/10/2018 CLINICAL DATA:  84 year old female post fall. On blood thinner. Dementia. Initial encounter. EXAM: CT HEAD WITHOUT CONTRAST CT CERVICAL SPINE WITHOUT CONTRAST TECHNIQUE: Multidetector CT imaging of the head and cervical spine was performed following the standard protocol without intravenous contrast. Multiplanar CT image reconstructions of the cervical spine were also generated. COMPARISON:  11/12/2016 CT head and cervical spine. FINDINGS: CT HEAD FINDINGS Brain: No intracranial hemorrhage or CT evidence of large acute infarct. Chronic microvascular changes. Global atrophy. No intracranial mass lesion noted on this unenhanced exam. Vascular: Vascular calcifications Skull: No skull fracture. Sinuses/Orbits: No acute orbital abnormality. Opacification right maxillary sinus with bone thickening suggesting chronic sinusitis. Other: Right frontal scalp hematoma. CT CERVICAL SPINE FINDINGS Alignment: Alignment similar to prior exam with fusion C2-C4. Skull base and vertebrae: No cervical spine fracture. Soft tissues and spinal canal: No abnormal prevertebral soft tissue swelling. Disc levels: Cervical spondylotic  changes most prominent C4-5 through C6-7 similar to prior exam. Upper chest: No worrisome abnormality. Other: Carotid bifurcation calcifications. Heterogeneous nodular thyroid gland. No worrisome neck mass. IMPRESSION: 1. Right frontal scalp hematoma without underlying skull fracture or intracranial hemorrhage. 2. Cervical spine alignment similar to prior exam with fusion C2 through C4. No cervical spine fracture or abnormal prevertebral soft tissue swelling. 3. Chronic opacification right maxillary sinus with wall thickening suggesting chronic sinusitis. 4. Chronic changes otherwise as detailed above. Electronically Signed   By: Lacy Duverney M.D.   On: 02/10/2018 16:27   Ct Cervical Spine Wo Contrast  Result Date: 02/10/2018 CLINICAL DATA:  84 year old female post fall. On blood thinner. Dementia. Initial encounter. EXAM: CT HEAD WITHOUT CONTRAST CT CERVICAL SPINE WITHOUT CONTRAST TECHNIQUE: Multidetector CT imaging of the head and cervical spine was performed following the standard protocol without intravenous contrast. Multiplanar CT image reconstructions of the cervical spine were also generated. COMPARISON:  11/12/2016 CT head and cervical spine. FINDINGS: CT HEAD FINDINGS Brain: No intracranial hemorrhage or CT evidence of large acute infarct. Chronic microvascular changes. Global atrophy. No intracranial mass lesion noted on this unenhanced exam. Vascular: Vascular calcifications Skull: No skull fracture. Sinuses/Orbits: No acute orbital abnormality. Opacification right maxillary sinus with bone thickening suggesting chronic sinusitis. Other: Right frontal scalp hematoma. CT CERVICAL SPINE FINDINGS Alignment: Alignment similar to prior exam with fusion C2-C4. Skull base and vertebrae: No cervical spine fracture. Soft tissues and spinal canal: No abnormal prevertebral soft tissue swelling. Disc levels: Cervical spondylotic changes most prominent C4-5 through C6-7 similar to prior exam. Upper chest: No  worrisome abnormality. Other: Carotid bifurcation calcifications. Heterogeneous nodular thyroid gland. No worrisome neck mass. IMPRESSION: 1. Right frontal scalp hematoma without underlying skull fracture or intracranial hemorrhage. 2. Cervical spine alignment similar to prior exam with fusion C2 through C4. No cervical spine fracture or abnormal prevertebral soft tissue swelling. 3. Chronic opacification right maxillary sinus with wall thickening suggesting chronic sinusitis. 4. Chronic changes otherwise as detailed above. Electronically Signed   By: Lacy Duverney M.D.   On: 02/10/2018 16:27   Dg Hip Unilat With Pelvis 2-3 Views Right  Result Date: 02/10/2018 CLINICAL DATA:  Mechanical fall onto RIGHT side, prior hip fracture and hip replacement EXAM: DG HIP (WITH OR WITHOUT PELVIS) 2-3V RIGHT COMPARISON:  09/20/2017 FINDINGS: RIGHT hip prosthesis identified. Bones diffusely demineralized. LEFT hip joint space preserved. SI joints poorly visualized. No acute fracture, dislocation, or bone destruction. IMPRESSION: Osseous demineralization with stable appearance of RIGHT hip prosthesis. No acute osseous abnormalities Electronically Signed   By: Ulyses Southward M.D.   On: 02/10/2018 17:02    Procedures Procedures (including critical care time)  Medications Ordered in ED Medications  cephALEXin (KEFLEX) capsule 500 mg (has no administration in time range)  sodium chloride 0.9 % bolus 1,000 mL (0 mLs Intravenous Stopped 02/10/18 1908)  sodium chloride 0.9 % bolus 500 mL (0 mLs Intravenous Stopped 02/10/18 2001)     Initial Impression / Assessment and Plan / ED Course  I have reviewed the triage vital signs and the nursing notes.  Pertinent labs & imaging results that were available during my care of the patient were reviewed by me and considered in my medical decision making (see chart for details).     Patient with hx of dementia presents via EMS from her nursing facility for evaluation of  witnessed fall as she was transferring from wheelchair to bed. On Eliquis. CT head shows right frontal scalp hematoma, no intracranial hemorrhage. No cervical spine fracture. Right hip xray negative for acute fracture.  On exam she appears dry and she is tachycardic. No fever and I doubt infectious source, will check labs, hydrate and get a urine.  CBC with slight leukocytosis (WBC 11.7) and UA shows infection.  Otherwise BMP shows bump in patients creatinine (1.17 vs 0.75), this is likely related to dehydration.  Patient given NS bolus and tolerating PO fluids at the bedside. Repeat rectal temp WNL. Tachycardia improved.  No concern for sepsis.  Plan to discharge with keflex and urine culture sent. I called patient's daughter who is her POA regarding the plan for patient to be discharged back to her nursing facility and she agrees. This was a shared visit with Dr. Dalene Seltzer.   Final Clinical Impressions(s) / ED Diagnoses   Final diagnoses:  Fall, initial encounter  Acute cystitis without hematuria    ED Discharge Orders         Ordered    cephALEXin (KEFLEX) 500 MG capsule  2 times daily     02/10/18 2210           Kellie Shropshire, PA-C 02/12/18 1259    Alvira Monday, MD 02/16/18 1201

## 2018-02-11 DIAGNOSIS — R2689 Other abnormalities of gait and mobility: Secondary | ICD-10-CM | POA: Diagnosis not present

## 2018-02-11 DIAGNOSIS — M6281 Muscle weakness (generalized): Secondary | ICD-10-CM | POA: Diagnosis not present

## 2018-02-11 DIAGNOSIS — R1319 Other dysphagia: Secondary | ICD-10-CM | POA: Diagnosis not present

## 2018-02-11 DIAGNOSIS — F0390 Unspecified dementia without behavioral disturbance: Secondary | ICD-10-CM | POA: Diagnosis not present

## 2018-02-11 DIAGNOSIS — F331 Major depressive disorder, recurrent, moderate: Secondary | ICD-10-CM | POA: Diagnosis not present

## 2018-02-11 DIAGNOSIS — R41841 Cognitive communication deficit: Secondary | ICD-10-CM | POA: Diagnosis not present

## 2018-02-12 DIAGNOSIS — R41841 Cognitive communication deficit: Secondary | ICD-10-CM | POA: Diagnosis not present

## 2018-02-12 DIAGNOSIS — F0390 Unspecified dementia without behavioral disturbance: Secondary | ICD-10-CM | POA: Diagnosis not present

## 2018-02-12 DIAGNOSIS — F331 Major depressive disorder, recurrent, moderate: Secondary | ICD-10-CM | POA: Diagnosis not present

## 2018-02-12 DIAGNOSIS — R2689 Other abnormalities of gait and mobility: Secondary | ICD-10-CM | POA: Diagnosis not present

## 2018-02-12 DIAGNOSIS — M6281 Muscle weakness (generalized): Secondary | ICD-10-CM | POA: Diagnosis not present

## 2018-02-12 DIAGNOSIS — R1319 Other dysphagia: Secondary | ICD-10-CM | POA: Diagnosis not present

## 2018-02-13 DIAGNOSIS — R41841 Cognitive communication deficit: Secondary | ICD-10-CM | POA: Diagnosis not present

## 2018-02-13 DIAGNOSIS — F0390 Unspecified dementia without behavioral disturbance: Secondary | ICD-10-CM | POA: Diagnosis not present

## 2018-02-13 DIAGNOSIS — R1319 Other dysphagia: Secondary | ICD-10-CM | POA: Diagnosis not present

## 2018-02-13 DIAGNOSIS — F331 Major depressive disorder, recurrent, moderate: Secondary | ICD-10-CM | POA: Diagnosis not present

## 2018-02-13 DIAGNOSIS — M6281 Muscle weakness (generalized): Secondary | ICD-10-CM | POA: Diagnosis not present

## 2018-02-13 DIAGNOSIS — R2689 Other abnormalities of gait and mobility: Secondary | ICD-10-CM | POA: Diagnosis not present

## 2018-02-13 LAB — URINE CULTURE

## 2018-02-14 ENCOUNTER — Telehealth: Payer: Self-pay

## 2018-02-14 DIAGNOSIS — F331 Major depressive disorder, recurrent, moderate: Secondary | ICD-10-CM | POA: Diagnosis not present

## 2018-02-14 DIAGNOSIS — R41841 Cognitive communication deficit: Secondary | ICD-10-CM | POA: Diagnosis not present

## 2018-02-14 DIAGNOSIS — F0390 Unspecified dementia without behavioral disturbance: Secondary | ICD-10-CM | POA: Diagnosis not present

## 2018-02-14 DIAGNOSIS — R1319 Other dysphagia: Secondary | ICD-10-CM | POA: Diagnosis not present

## 2018-02-14 DIAGNOSIS — R2689 Other abnormalities of gait and mobility: Secondary | ICD-10-CM | POA: Diagnosis not present

## 2018-02-14 DIAGNOSIS — M6281 Muscle weakness (generalized): Secondary | ICD-10-CM | POA: Diagnosis not present

## 2018-02-14 NOTE — Telephone Encounter (Signed)
Post ED Visit - Positive Culture Follow-up  Culture report reviewed by antimicrobial stewardship pharmacist:  []  Enzo Bi, Pharm.D. []  Celedonio Miyamoto, Pharm.D., BCPS AQ-ID []  Garvin Fila, Pharm.D., BCPS []  Georgina Pillion, Pharm.D., BCPS []  [place], [place].D., BCPS, AAHIVP []  Estella Husk, Pharm.D., BCPS, AAHIVP []  Lysle Pearl, PharmD, BCPS []  Phillips Climes, PharmD, BCPS []  Agapito Games, PharmD, BCPS []  Verlan Friends, PharmD Meryl Dare Pharm D Positive urine culture Treated with Cephalexin, organism sensitive to the same and no further patient follow-up is required at this time.  Jerry Caras 02/14/2018, 10:27 AM

## 2018-02-15 DIAGNOSIS — R41841 Cognitive communication deficit: Secondary | ICD-10-CM | POA: Diagnosis not present

## 2018-02-15 DIAGNOSIS — F0390 Unspecified dementia without behavioral disturbance: Secondary | ICD-10-CM | POA: Diagnosis not present

## 2018-02-15 DIAGNOSIS — F331 Major depressive disorder, recurrent, moderate: Secondary | ICD-10-CM | POA: Diagnosis not present

## 2018-02-15 DIAGNOSIS — R2689 Other abnormalities of gait and mobility: Secondary | ICD-10-CM | POA: Diagnosis not present

## 2018-02-15 DIAGNOSIS — R1319 Other dysphagia: Secondary | ICD-10-CM | POA: Diagnosis not present

## 2018-02-15 DIAGNOSIS — M6281 Muscle weakness (generalized): Secondary | ICD-10-CM | POA: Diagnosis not present

## 2018-02-17 DIAGNOSIS — R41841 Cognitive communication deficit: Secondary | ICD-10-CM | POA: Diagnosis not present

## 2018-02-17 DIAGNOSIS — R2689 Other abnormalities of gait and mobility: Secondary | ICD-10-CM | POA: Diagnosis not present

## 2018-02-17 DIAGNOSIS — F0390 Unspecified dementia without behavioral disturbance: Secondary | ICD-10-CM | POA: Diagnosis not present

## 2018-02-17 DIAGNOSIS — R1319 Other dysphagia: Secondary | ICD-10-CM | POA: Diagnosis not present

## 2018-02-17 DIAGNOSIS — M6281 Muscle weakness (generalized): Secondary | ICD-10-CM | POA: Diagnosis not present

## 2018-02-17 DIAGNOSIS — F331 Major depressive disorder, recurrent, moderate: Secondary | ICD-10-CM | POA: Diagnosis not present

## 2018-02-18 DIAGNOSIS — F0390 Unspecified dementia without behavioral disturbance: Secondary | ICD-10-CM | POA: Diagnosis not present

## 2018-02-18 DIAGNOSIS — R2689 Other abnormalities of gait and mobility: Secondary | ICD-10-CM | POA: Diagnosis not present

## 2018-02-18 DIAGNOSIS — M6281 Muscle weakness (generalized): Secondary | ICD-10-CM | POA: Diagnosis not present

## 2018-02-18 DIAGNOSIS — R41841 Cognitive communication deficit: Secondary | ICD-10-CM | POA: Diagnosis not present

## 2018-02-18 DIAGNOSIS — F331 Major depressive disorder, recurrent, moderate: Secondary | ICD-10-CM | POA: Diagnosis not present

## 2018-02-18 DIAGNOSIS — R1319 Other dysphagia: Secondary | ICD-10-CM | POA: Diagnosis not present

## 2018-02-19 DIAGNOSIS — R1319 Other dysphagia: Secondary | ICD-10-CM | POA: Diagnosis not present

## 2018-02-19 DIAGNOSIS — R41841 Cognitive communication deficit: Secondary | ICD-10-CM | POA: Diagnosis not present

## 2018-02-19 DIAGNOSIS — F0390 Unspecified dementia without behavioral disturbance: Secondary | ICD-10-CM | POA: Diagnosis not present

## 2018-02-19 DIAGNOSIS — R2689 Other abnormalities of gait and mobility: Secondary | ICD-10-CM | POA: Diagnosis not present

## 2018-02-19 DIAGNOSIS — M6281 Muscle weakness (generalized): Secondary | ICD-10-CM | POA: Diagnosis not present

## 2018-02-19 DIAGNOSIS — F331 Major depressive disorder, recurrent, moderate: Secondary | ICD-10-CM | POA: Diagnosis not present

## 2018-02-20 DIAGNOSIS — M6281 Muscle weakness (generalized): Secondary | ICD-10-CM | POA: Diagnosis not present

## 2018-02-20 DIAGNOSIS — R1319 Other dysphagia: Secondary | ICD-10-CM | POA: Diagnosis not present

## 2018-02-20 DIAGNOSIS — F0390 Unspecified dementia without behavioral disturbance: Secondary | ICD-10-CM | POA: Diagnosis not present

## 2018-02-20 DIAGNOSIS — F331 Major depressive disorder, recurrent, moderate: Secondary | ICD-10-CM | POA: Diagnosis not present

## 2018-02-20 DIAGNOSIS — R2689 Other abnormalities of gait and mobility: Secondary | ICD-10-CM | POA: Diagnosis not present

## 2018-02-20 DIAGNOSIS — R41841 Cognitive communication deficit: Secondary | ICD-10-CM | POA: Diagnosis not present

## 2018-02-21 DIAGNOSIS — F0390 Unspecified dementia without behavioral disturbance: Secondary | ICD-10-CM | POA: Diagnosis not present

## 2018-02-21 DIAGNOSIS — R1319 Other dysphagia: Secondary | ICD-10-CM | POA: Diagnosis not present

## 2018-02-21 DIAGNOSIS — R2689 Other abnormalities of gait and mobility: Secondary | ICD-10-CM | POA: Diagnosis not present

## 2018-02-21 DIAGNOSIS — F331 Major depressive disorder, recurrent, moderate: Secondary | ICD-10-CM | POA: Diagnosis not present

## 2018-02-21 DIAGNOSIS — R41841 Cognitive communication deficit: Secondary | ICD-10-CM | POA: Diagnosis not present

## 2018-02-21 DIAGNOSIS — M6281 Muscle weakness (generalized): Secondary | ICD-10-CM | POA: Diagnosis not present

## 2018-02-24 DIAGNOSIS — F0390 Unspecified dementia without behavioral disturbance: Secondary | ICD-10-CM | POA: Diagnosis not present

## 2018-02-24 DIAGNOSIS — R2689 Other abnormalities of gait and mobility: Secondary | ICD-10-CM | POA: Diagnosis not present

## 2018-02-24 DIAGNOSIS — R41841 Cognitive communication deficit: Secondary | ICD-10-CM | POA: Diagnosis not present

## 2018-02-24 DIAGNOSIS — M6281 Muscle weakness (generalized): Secondary | ICD-10-CM | POA: Diagnosis not present

## 2018-02-24 DIAGNOSIS — F331 Major depressive disorder, recurrent, moderate: Secondary | ICD-10-CM | POA: Diagnosis not present

## 2018-02-24 DIAGNOSIS — R1319 Other dysphagia: Secondary | ICD-10-CM | POA: Diagnosis not present

## 2018-02-25 DIAGNOSIS — F0390 Unspecified dementia without behavioral disturbance: Secondary | ICD-10-CM | POA: Diagnosis not present

## 2018-02-25 DIAGNOSIS — R41841 Cognitive communication deficit: Secondary | ICD-10-CM | POA: Diagnosis not present

## 2018-02-25 DIAGNOSIS — M6281 Muscle weakness (generalized): Secondary | ICD-10-CM | POA: Diagnosis not present

## 2018-02-25 DIAGNOSIS — R2689 Other abnormalities of gait and mobility: Secondary | ICD-10-CM | POA: Diagnosis not present

## 2018-02-25 DIAGNOSIS — R1319 Other dysphagia: Secondary | ICD-10-CM | POA: Diagnosis not present

## 2018-02-25 DIAGNOSIS — F331 Major depressive disorder, recurrent, moderate: Secondary | ICD-10-CM | POA: Diagnosis not present

## 2018-02-26 DIAGNOSIS — M6281 Muscle weakness (generalized): Secondary | ICD-10-CM | POA: Diagnosis not present

## 2018-02-26 DIAGNOSIS — R2689 Other abnormalities of gait and mobility: Secondary | ICD-10-CM | POA: Diagnosis not present

## 2018-02-26 DIAGNOSIS — F331 Major depressive disorder, recurrent, moderate: Secondary | ICD-10-CM | POA: Diagnosis not present

## 2018-02-26 DIAGNOSIS — R1319 Other dysphagia: Secondary | ICD-10-CM | POA: Diagnosis not present

## 2018-02-26 DIAGNOSIS — F0390 Unspecified dementia without behavioral disturbance: Secondary | ICD-10-CM | POA: Diagnosis not present

## 2018-02-26 DIAGNOSIS — R41841 Cognitive communication deficit: Secondary | ICD-10-CM | POA: Diagnosis not present

## 2018-02-27 DIAGNOSIS — F331 Major depressive disorder, recurrent, moderate: Secondary | ICD-10-CM | POA: Diagnosis not present

## 2018-02-27 DIAGNOSIS — R2689 Other abnormalities of gait and mobility: Secondary | ICD-10-CM | POA: Diagnosis not present

## 2018-02-27 DIAGNOSIS — R41841 Cognitive communication deficit: Secondary | ICD-10-CM | POA: Diagnosis not present

## 2018-02-27 DIAGNOSIS — M6281 Muscle weakness (generalized): Secondary | ICD-10-CM | POA: Diagnosis not present

## 2018-02-27 DIAGNOSIS — F0390 Unspecified dementia without behavioral disturbance: Secondary | ICD-10-CM | POA: Diagnosis not present

## 2018-02-27 DIAGNOSIS — R1319 Other dysphagia: Secondary | ICD-10-CM | POA: Diagnosis not present

## 2018-02-28 DIAGNOSIS — R2689 Other abnormalities of gait and mobility: Secondary | ICD-10-CM | POA: Diagnosis not present

## 2018-02-28 DIAGNOSIS — I7389 Other specified peripheral vascular diseases: Secondary | ICD-10-CM | POA: Diagnosis not present

## 2018-02-28 DIAGNOSIS — F0390 Unspecified dementia without behavioral disturbance: Secondary | ICD-10-CM | POA: Diagnosis not present

## 2018-03-03 DIAGNOSIS — F0390 Unspecified dementia without behavioral disturbance: Secondary | ICD-10-CM | POA: Diagnosis not present

## 2018-03-03 DIAGNOSIS — R2689 Other abnormalities of gait and mobility: Secondary | ICD-10-CM | POA: Diagnosis not present

## 2018-03-03 DIAGNOSIS — I7389 Other specified peripheral vascular diseases: Secondary | ICD-10-CM | POA: Diagnosis not present

## 2018-03-04 DIAGNOSIS — R2689 Other abnormalities of gait and mobility: Secondary | ICD-10-CM | POA: Diagnosis not present

## 2018-03-04 DIAGNOSIS — I7389 Other specified peripheral vascular diseases: Secondary | ICD-10-CM | POA: Diagnosis not present

## 2018-03-04 DIAGNOSIS — F0390 Unspecified dementia without behavioral disturbance: Secondary | ICD-10-CM | POA: Diagnosis not present

## 2018-03-05 DIAGNOSIS — R2689 Other abnormalities of gait and mobility: Secondary | ICD-10-CM | POA: Diagnosis not present

## 2018-03-05 DIAGNOSIS — F0390 Unspecified dementia without behavioral disturbance: Secondary | ICD-10-CM | POA: Diagnosis not present

## 2018-03-05 DIAGNOSIS — I7389 Other specified peripheral vascular diseases: Secondary | ICD-10-CM | POA: Diagnosis not present

## 2018-03-06 DIAGNOSIS — R2689 Other abnormalities of gait and mobility: Secondary | ICD-10-CM | POA: Diagnosis not present

## 2018-03-06 DIAGNOSIS — I7389 Other specified peripheral vascular diseases: Secondary | ICD-10-CM | POA: Diagnosis not present

## 2018-03-06 DIAGNOSIS — F0390 Unspecified dementia without behavioral disturbance: Secondary | ICD-10-CM | POA: Diagnosis not present

## 2018-03-07 DIAGNOSIS — F0390 Unspecified dementia without behavioral disturbance: Secondary | ICD-10-CM | POA: Diagnosis not present

## 2018-03-07 DIAGNOSIS — R2689 Other abnormalities of gait and mobility: Secondary | ICD-10-CM | POA: Diagnosis not present

## 2018-03-07 DIAGNOSIS — I7389 Other specified peripheral vascular diseases: Secondary | ICD-10-CM | POA: Diagnosis not present

## 2018-03-10 DIAGNOSIS — I7389 Other specified peripheral vascular diseases: Secondary | ICD-10-CM | POA: Diagnosis not present

## 2018-03-10 DIAGNOSIS — R2689 Other abnormalities of gait and mobility: Secondary | ICD-10-CM | POA: Diagnosis not present

## 2018-03-10 DIAGNOSIS — F0390 Unspecified dementia without behavioral disturbance: Secondary | ICD-10-CM | POA: Diagnosis not present

## 2018-03-11 DIAGNOSIS — F0390 Unspecified dementia without behavioral disturbance: Secondary | ICD-10-CM | POA: Diagnosis not present

## 2018-03-11 DIAGNOSIS — R2689 Other abnormalities of gait and mobility: Secondary | ICD-10-CM | POA: Diagnosis not present

## 2018-03-11 DIAGNOSIS — I7389 Other specified peripheral vascular diseases: Secondary | ICD-10-CM | POA: Diagnosis not present

## 2018-03-12 DIAGNOSIS — I7389 Other specified peripheral vascular diseases: Secondary | ICD-10-CM | POA: Diagnosis not present

## 2018-03-12 DIAGNOSIS — R2689 Other abnormalities of gait and mobility: Secondary | ICD-10-CM | POA: Diagnosis not present

## 2018-03-12 DIAGNOSIS — F0390 Unspecified dementia without behavioral disturbance: Secondary | ICD-10-CM | POA: Diagnosis not present

## 2018-03-13 DIAGNOSIS — F0390 Unspecified dementia without behavioral disturbance: Secondary | ICD-10-CM | POA: Diagnosis not present

## 2018-03-13 DIAGNOSIS — F322 Major depressive disorder, single episode, severe without psychotic features: Secondary | ICD-10-CM | POA: Diagnosis not present

## 2018-03-13 DIAGNOSIS — R2689 Other abnormalities of gait and mobility: Secondary | ICD-10-CM | POA: Diagnosis not present

## 2018-03-13 DIAGNOSIS — I509 Heart failure, unspecified: Secondary | ICD-10-CM | POA: Diagnosis not present

## 2018-03-13 DIAGNOSIS — I10 Essential (primary) hypertension: Secondary | ICD-10-CM | POA: Diagnosis not present

## 2018-03-13 DIAGNOSIS — E46 Unspecified protein-calorie malnutrition: Secondary | ICD-10-CM | POA: Diagnosis not present

## 2018-03-13 DIAGNOSIS — I7389 Other specified peripheral vascular diseases: Secondary | ICD-10-CM | POA: Diagnosis not present

## 2018-03-13 DIAGNOSIS — M1990 Unspecified osteoarthritis, unspecified site: Secondary | ICD-10-CM | POA: Diagnosis not present

## 2018-03-13 DIAGNOSIS — I4891 Unspecified atrial fibrillation: Secondary | ICD-10-CM | POA: Diagnosis not present

## 2018-03-14 DIAGNOSIS — I7389 Other specified peripheral vascular diseases: Secondary | ICD-10-CM | POA: Diagnosis not present

## 2018-03-14 DIAGNOSIS — F0390 Unspecified dementia without behavioral disturbance: Secondary | ICD-10-CM | POA: Diagnosis not present

## 2018-03-14 DIAGNOSIS — R2689 Other abnormalities of gait and mobility: Secondary | ICD-10-CM | POA: Diagnosis not present

## 2018-03-17 DIAGNOSIS — I7389 Other specified peripheral vascular diseases: Secondary | ICD-10-CM | POA: Diagnosis not present

## 2018-03-17 DIAGNOSIS — F0390 Unspecified dementia without behavioral disturbance: Secondary | ICD-10-CM | POA: Diagnosis not present

## 2018-03-17 DIAGNOSIS — R2689 Other abnormalities of gait and mobility: Secondary | ICD-10-CM | POA: Diagnosis not present

## 2018-11-29 DEATH — deceased
